# Patient Record
Sex: Male | Born: 1965 | Race: White | Hispanic: No
Health system: Midwestern US, Community
[De-identification: ages and names within clinical notes are randomized; demographics above are authoritative.]

## PROBLEM LIST (undated history)

## (undated) DIAGNOSIS — M549 Dorsalgia, unspecified: Secondary | ICD-10-CM

## (undated) DIAGNOSIS — E1140 Type 2 diabetes mellitus with diabetic neuropathy, unspecified: Principal | ICD-10-CM

## (undated) DIAGNOSIS — B182 Chronic viral hepatitis C: Principal | ICD-10-CM

## (undated) DIAGNOSIS — G629 Polyneuropathy, unspecified: Secondary | ICD-10-CM

## (undated) DIAGNOSIS — Z87820 Personal history of traumatic brain injury: Secondary | ICD-10-CM

## (undated) DIAGNOSIS — F329 Major depressive disorder, single episode, unspecified: Secondary | ICD-10-CM

## (undated) DIAGNOSIS — M24562 Contracture, left knee: Secondary | ICD-10-CM

## (undated) DIAGNOSIS — I10 Essential (primary) hypertension: Secondary | ICD-10-CM

## (undated) DIAGNOSIS — R569 Unspecified convulsions: Secondary | ICD-10-CM

## (undated) DIAGNOSIS — E119 Type 2 diabetes mellitus without complications: Secondary | ICD-10-CM

## (undated) DIAGNOSIS — E079 Disorder of thyroid, unspecified: Secondary | ICD-10-CM

## (undated) DIAGNOSIS — M24552 Contracture, left hip: Secondary | ICD-10-CM

## (undated) DIAGNOSIS — F32A Depression, unspecified: Secondary | ICD-10-CM

## (undated) DIAGNOSIS — R1313 Dysphagia, pharyngeal phase: Secondary | ICD-10-CM

## (undated) DIAGNOSIS — E039 Hypothyroidism, unspecified: Secondary | ICD-10-CM

## (undated) DIAGNOSIS — L8990 Pressure ulcer of unspecified site, unspecified stage: Secondary | ICD-10-CM

## (undated) DIAGNOSIS — M24542 Contracture, left hand: Secondary | ICD-10-CM

## (undated) DIAGNOSIS — F411 Generalized anxiety disorder: Secondary | ICD-10-CM

## (undated) DIAGNOSIS — G8110 Spastic hemiplegia affecting unspecified side: Secondary | ICD-10-CM

## (undated) DIAGNOSIS — K5900 Constipation, unspecified: Secondary | ICD-10-CM

## (undated) DIAGNOSIS — F39 Unspecified mood [affective] disorder: Secondary | ICD-10-CM

## (undated) DIAGNOSIS — F458 Other somatoform disorders: Secondary | ICD-10-CM

## (undated) DIAGNOSIS — F29 Unspecified psychosis not due to a substance or known physiological condition: Secondary | ICD-10-CM

## (undated) DIAGNOSIS — M24522 Contracture, left elbow: Secondary | ICD-10-CM

## (undated) DIAGNOSIS — R1312 Dysphagia, oropharyngeal phase: Secondary | ICD-10-CM

## (undated) DIAGNOSIS — G2401 Drug induced subacute dyskinesia: Secondary | ICD-10-CM

## (undated) HISTORY — PX: VENTRICULOPERITONEAL SHUNT: SHX204

---

## 2007-09-19 ENCOUNTER — Encounter: Payer: Self-pay | Admitting: Emergency Medicine

## 2007-09-20 ENCOUNTER — Inpatient Hospital Stay (HOSPITAL_COMMUNITY): Admission: EM | Admit: 2007-09-20 | Discharge: 2007-09-24 | Payer: Self-pay | Admitting: Internal Medicine

## 2007-09-21 ENCOUNTER — Encounter (INDEPENDENT_AMBULATORY_CARE_PROVIDER_SITE_OTHER): Payer: Self-pay | Admitting: Internal Medicine

## 2008-10-10 ENCOUNTER — Inpatient Hospital Stay (HOSPITAL_COMMUNITY): Admission: EM | Admit: 2008-10-10 | Discharge: 2008-10-17 | Payer: Self-pay | Admitting: Emergency Medicine

## 2009-11-20 ENCOUNTER — Observation Stay (HOSPITAL_COMMUNITY): Admission: EM | Admit: 2009-11-20 | Discharge: 2009-11-28 | Payer: Self-pay | Admitting: Emergency Medicine

## 2010-11-06 LAB — GLUCOSE, CAPILLARY
Glucose-Capillary: 110 mg/dL — ABNORMAL HIGH (ref 70–99)
Glucose-Capillary: 117 mg/dL — ABNORMAL HIGH (ref 70–99)
Glucose-Capillary: 120 mg/dL — ABNORMAL HIGH (ref 70–99)
Glucose-Capillary: 129 mg/dL — ABNORMAL HIGH (ref 70–99)
Glucose-Capillary: 130 mg/dL — ABNORMAL HIGH (ref 70–99)
Glucose-Capillary: 133 mg/dL — ABNORMAL HIGH (ref 70–99)
Glucose-Capillary: 139 mg/dL — ABNORMAL HIGH (ref 70–99)
Glucose-Capillary: 143 mg/dL — ABNORMAL HIGH (ref 70–99)
Glucose-Capillary: 143 mg/dL — ABNORMAL HIGH (ref 70–99)
Glucose-Capillary: 144 mg/dL — ABNORMAL HIGH (ref 70–99)
Glucose-Capillary: 152 mg/dL — ABNORMAL HIGH (ref 70–99)
Glucose-Capillary: 164 mg/dL — ABNORMAL HIGH (ref 70–99)
Glucose-Capillary: 171 mg/dL — ABNORMAL HIGH (ref 70–99)

## 2010-11-06 LAB — COMPREHENSIVE METABOLIC PANEL
Alkaline Phosphatase: 181 U/L — ABNORMAL HIGH (ref 39–117)
Calcium: 9.1 mg/dL (ref 8.4–10.5)
Chloride: 107 mEq/L (ref 96–112)
GFR calc non Af Amer: >60 mL/min (ref >60)
Glucose, Bld: 113 mg/dL — ABNORMAL HIGH (ref 70–99)
Potassium: 3.7 mEq/L (ref 3.5–5.1)
Sodium: 145 mEq/L (ref 135–145)

## 2010-11-06 LAB — URINE CULTURE: Special Requests: NEGATIVE

## 2010-11-06 LAB — URINALYSIS, ROUTINE W REFLEX MICROSCOPIC
Glucose, UA: NEGATIVE mg/dL
Ketones, ur: NEGATIVE mg/dL
Protein, ur: NEGATIVE mg/dL
Specific Gravity, Urine: 1.024 (ref 1.005–1.030)
pH: 6 (ref 5.0–8.0)

## 2010-11-06 LAB — LIPID PANEL
Triglycerides: 213 mg/dL — ABNORMAL HIGH (ref <150)
VLDL: 43 mg/dL — ABNORMAL HIGH (ref 0–40)

## 2010-11-06 LAB — RAPID URINE DRUG SCREEN, HOSP PERFORMED: Cocaine: NOT DETECTED

## 2010-11-06 LAB — MAGNESIUM: Magnesium: 2.1 mg/dL (ref 1.5–2.5)

## 2010-11-06 LAB — PHOSPHORUS: Phosphorus: 3.8 mg/dL (ref 2.3–4.6)

## 2010-11-06 LAB — PROTIME-INR
INR: 1.07 (ref 0.00–1.49)
Prothrombin Time: 13.8 seconds (ref 11.6–15.2)

## 2010-11-06 LAB — APTT: aPTT: 21 seconds — ABNORMAL LOW (ref 24–37)

## 2010-11-06 LAB — CARDIAC PANEL(CRET KIN+CKTOT+MB+TROPI)
CK, MB: 2.9 ng/mL (ref 0.3–4.0)
Total CK: 709 U/L — ABNORMAL HIGH (ref 7–232)
Total CK: 757 U/L — ABNORMAL HIGH (ref 7–232)
Troponin I: 0.04 ng/mL (ref 0.00–0.06)
Troponin I: 0.06 ng/mL (ref 0.00–0.06)

## 2010-11-06 LAB — DIFFERENTIAL
Basophils Absolute: 0 10*3/uL (ref 0.0–0.1)
Eosinophils Relative: 4 % (ref 0–5)
Lymphocytes Relative: 51 % — ABNORMAL HIGH (ref 12–46)
Neutro Abs: 3.8 10*3/uL (ref 1.7–7.7)
Neutrophils Relative %: 35 % — ABNORMAL LOW (ref 43–77)

## 2010-11-06 LAB — CBC
HCT: 49.8 % (ref 39.0–52.0)
Hemoglobin: 16 g/dL (ref 13.0–17.0)
MCHC: 32.1 g/dL (ref 30.0–36.0)
Platelets: 221 10*3/uL (ref 150–400)
RDW: 17.9 % — ABNORMAL HIGH (ref 11.5–15.5)

## 2010-11-06 LAB — HEMOGLOBIN A1C: Hgb A1c MFr Bld: 7.5 % — ABNORMAL HIGH (ref 4.6–6.1)

## 2010-11-28 LAB — CBC
HCT: 46.7 % (ref 39.0–52.0)
Hemoglobin: 15.5 g/dL (ref 13.0–17.0)
MCHC: 33.1 g/dL (ref 30.0–36.0)
MCV: 84.6 fL (ref 78.0–100.0)
Platelets: 335 10*3/uL (ref 150–400)
RBC: 5.51 MIL/uL (ref 4.22–5.81)
RDW: 16.2 % — ABNORMAL HIGH (ref 11.5–15.5)
WBC: 8.7 10*3/uL (ref 4.0–10.5)

## 2010-11-28 LAB — GLUCOSE, CAPILLARY
Glucose-Capillary: 144 mg/dL — ABNORMAL HIGH (ref 70–99)
Glucose-Capillary: 150 mg/dL — ABNORMAL HIGH (ref 70–99)
Glucose-Capillary: 157 mg/dL — ABNORMAL HIGH (ref 70–99)
Glucose-Capillary: 161 mg/dL — ABNORMAL HIGH (ref 70–99)
Glucose-Capillary: 182 mg/dL — ABNORMAL HIGH (ref 70–99)
Glucose-Capillary: 199 mg/dL — ABNORMAL HIGH (ref 70–99)

## 2010-12-03 LAB — POCT I-STAT, CHEM 8
Chloride: 101 mEq/L (ref 96–112)
HCT: 41 % (ref 39.0–52.0)
Potassium: 3.1 mEq/L — ABNORMAL LOW (ref 3.5–5.1)

## 2010-12-03 LAB — CBC
HCT: 36.8 % — ABNORMAL LOW (ref 39.0–52.0)
HCT: 38.1 % — ABNORMAL LOW (ref 39.0–52.0)
HCT: 41.1 % (ref 39.0–52.0)
MCHC: 33.7 g/dL (ref 30.0–36.0)
MCHC: 34.3 g/dL (ref 30.0–36.0)
MCV: 83.8 fL (ref 78.0–100.0)
MCV: 84 fL (ref 78.0–100.0)
Platelets: 302 10*3/uL (ref 150–400)
Platelets: 328 10*3/uL (ref 150–400)
Platelets: 335 10*3/uL (ref 150–400)
Platelets: 354 10*3/uL (ref 150–400)
Platelets: 362 10*3/uL (ref 150–400)
RDW: 15.9 % — ABNORMAL HIGH (ref 11.5–15.5)
WBC: 6.4 10*3/uL (ref 4.0–10.5)
WBC: 6.8 10*3/uL (ref 4.0–10.5)
WBC: 6.9 10*3/uL (ref 4.0–10.5)
WBC: 8.4 10*3/uL (ref 4.0–10.5)

## 2010-12-03 LAB — PROTIME-INR
INR: 1.1 (ref 0.00–1.49)
INR: 1.3 (ref 0.00–1.49)
Prothrombin Time: 13.6 seconds (ref 11.6–15.2)
Prothrombin Time: 14.3 seconds (ref 11.6–15.2)
Prothrombin Time: 16.4 seconds — ABNORMAL HIGH (ref 11.6–15.2)

## 2010-12-03 LAB — LUPUS ANTICOAGULANT PANEL: PTT Lupus Anticoagulant: 44.1 secs (ref 36.3–48.8)

## 2010-12-03 LAB — BASIC METABOLIC PANEL
BUN: 3 mg/dL — ABNORMAL LOW (ref 6–23)
BUN: 3 mg/dL — ABNORMAL LOW (ref 6–23)
CO2: 28 mEq/L (ref 19–32)
Calcium: 9 mg/dL (ref 8.4–10.5)
Chloride: 102 mEq/L (ref 96–112)
Creatinine, Ser: 0.54 mg/dL (ref 0.4–1.5)
Creatinine, Ser: 0.63 mg/dL (ref 0.4–1.5)
GFR calc Af Amer: >60 mL/min (ref >60)
GFR calc non Af Amer: >60 mL/min (ref >60)
Glucose, Bld: 257 mg/dL — ABNORMAL HIGH (ref 70–99)
Potassium: 3.6 mEq/L (ref 3.5–5.1)
Sodium: 139 mEq/L (ref 135–145)

## 2010-12-03 LAB — DIFFERENTIAL
Basophils Absolute: 0.1 10*3/uL (ref 0.0–0.1)
Basophils Relative: 1 % (ref 0–1)
Eosinophils Relative: 4 % (ref 0–5)
Lymphocytes Relative: 35 % (ref 12–46)

## 2010-12-03 LAB — URINALYSIS, ROUTINE W REFLEX MICROSCOPIC
Hgb urine dipstick: NEGATIVE
Nitrite: NEGATIVE
Specific Gravity, Urine: 1.011 (ref 1.005–1.030)
Urobilinogen, UA: 1 mg/dL (ref 0.0–1.0)
pH: 7.5 (ref 5.0–8.0)

## 2010-12-03 LAB — URINE CULTURE: Colony Count: 90000

## 2010-12-03 LAB — TSH: TSH: 0.486 u[IU]/mL (ref 0.350–4.500)

## 2010-12-03 LAB — GLUCOSE, CAPILLARY
Glucose-Capillary: 142 mg/dL — ABNORMAL HIGH (ref 70–99)
Glucose-Capillary: 171 mg/dL — ABNORMAL HIGH (ref 70–99)
Glucose-Capillary: 203 mg/dL — ABNORMAL HIGH (ref 70–99)
Glucose-Capillary: 235 mg/dL — ABNORMAL HIGH (ref 70–99)
Glucose-Capillary: 250 mg/dL — ABNORMAL HIGH (ref 70–99)
Glucose-Capillary: 252 mg/dL — ABNORMAL HIGH (ref 70–99)

## 2010-12-03 LAB — MAGNESIUM: Magnesium: 2.1 mg/dL (ref 1.5–2.5)

## 2010-12-03 LAB — BETA-2-GLYCOPROTEIN I ABS, IGG/M/A
Beta-2-Glycoprotein I IgA: 13 U/mL (ref <10)
Beta-2-Glycoprotein I IgM: <4 U/mL (ref <10)

## 2010-12-03 LAB — APTT: aPTT: 36 seconds (ref 24–37)

## 2010-12-03 LAB — PROTEIN S, TOTAL: Protein S Ag, Total: 137 % (ref 70–140)

## 2010-12-03 LAB — HOMOCYSTEINE: Homocysteine: 4.5 umol/L (ref 4.0–15.4)

## 2010-12-03 LAB — PROTEIN S ACTIVITY: Protein S Activity: 120 % (ref 69–129)

## 2010-12-03 LAB — FACTOR 5 LEIDEN

## 2010-12-03 LAB — ANTITHROMBIN III: AntiThromb III Func: 83 % (ref 76–126)

## 2010-12-03 LAB — CARDIOLIPIN ANTIBODIES, IGG, IGM, IGA: Anticardiolipin IgA: 30 [APL'U] (ref <13)
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
## 2010-12-31 NOTE — Procedures (Signed)
EEG NUMBER:  [id number]   HISTORY OF PRESENT ILLNESS:  This is a 45 year old with altered mental  status who is in psychosis who is having EEG done to evaluate for  seizures.   PROCEDURE:  This is a portable EEG.   TECHNICAL DESCRIPTION:  Per this portable EEG, there is no distinct or  sustained posterior dominant rhythm.  Background activity symmetric  mostly comprised of low-voltage mixed frequency activity primarily alpha  and theta range activity at 10-15 microvolts.  The patient does have a  tremendous amount of blinking in the EMG artifact.  Photic stimulation  or hyperventilation performed throughout this record.  The patient did  not go to sleep during this tracing.  Throughout this record, there is  no definitive epileptiform activity noted.  EKG tracing shows a heart  rate of 80-100 beats per minute.   IMPRESSION:  This portable EEG is essentially within normal limits in  the awake state.  There could possibly be some mild background slowing  noticed which could be suggestive of a toxic metabolic or primary  neuronal disorder.  There is no definitive epileptiform activity noticed  throughout this recording.      Bevelyn Buckles. Nash Shearer, M.D.  Electronically Signed     BJY:NWGN  D:  09/23/2007 19:50:46  T:  09/24/2007 19:24:26  Job #:  [id number]
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
## 2011-05-09 LAB — BASIC METABOLIC PANEL
BUN: 5 — ABNORMAL LOW
BUN: 6
Calcium: 8.1 — ABNORMAL LOW
Calcium: 8.4
Calcium: 8.8
Chloride: 110
Creatinine, Ser: 0.87
Creatinine, Ser: 0.91
Creatinine, Ser: 0.99
GFR calc Af Amer: >60
GFR calc Af Amer: >60
GFR calc non Af Amer: >60
GFR calc non Af Amer: >60
Glucose, Bld: 103 — ABNORMAL HIGH
Glucose, Bld: 84
Potassium: 3.6
Sodium: 140

## 2011-05-09 LAB — COMPREHENSIVE METABOLIC PANEL
ALT: 16
AST: 15
AST: 32
Albumin: 2.9 — ABNORMAL LOW
Albumin: 3.3 — ABNORMAL LOW
Alkaline Phosphatase: 70
Alkaline Phosphatase: 83
CO2: 27
Chloride: 109
Chloride: 109
Creatinine, Ser: 0.79
GFR calc Af Amer: >60
GFR calc Af Amer: >60
GFR calc non Af Amer: >60
Potassium: 3 — ABNORMAL LOW
Potassium: 3.1 — ABNORMAL LOW
Total Bilirubin: 1.1
Total Bilirubin: 1.4 — ABNORMAL HIGH

## 2011-05-09 LAB — URINALYSIS, ROUTINE W REFLEX MICROSCOPIC
Bilirubin Urine: NEGATIVE
Glucose, UA: NEGATIVE
Hgb urine dipstick: NEGATIVE
Ketones, ur: NEGATIVE
Nitrite: NEGATIVE
pH: 5.5

## 2011-05-09 LAB — CBC
HCT: 44.5
Hemoglobin: 14.5
MCHC: 32.6
MCV: 79.9
MCV: 80.5
MCV: 80.6
Platelets: 244
Platelets: 262
Platelets: 296
RBC: 4.9
RBC: 5.17
RDW: 17.4 — ABNORMAL HIGH
WBC: 12.3 — ABNORMAL HIGH
WBC: 7.2
WBC: 8.7

## 2011-05-09 LAB — DIFFERENTIAL
Basophils Absolute: 0.1
Basophils Relative: 0
Eosinophils Absolute: 0
Eosinophils Relative: 0
Monocytes Absolute: 0.4

## 2011-05-09 LAB — CK TOTAL AND CKMB (NOT AT ARMC): Relative Index: 2.1

## 2011-05-09 LAB — CULTURE, BLOOD (ROUTINE X 2)

## 2011-05-09 LAB — I-STAT 8, (EC8 V) (CONVERTED LAB)
BUN: 14
Chloride: 110
Glucose, Bld: 146 — ABNORMAL HIGH
HCT: 49
Operator id: 151321
pCO2, Ven: 42.5 — ABNORMAL LOW
pH, Ven: 7.43 — ABNORMAL HIGH

## 2011-05-09 LAB — POCT CARDIAC MARKERS
CKMB, poc: 7.5
Myoglobin, poc: >500
Myoglobin, poc: >500
Operator id: 151321
Operator id: 151321
Troponin i, poc: <0.05

## 2011-05-09 LAB — TROPONIN I: Troponin I: 0.01

## 2011-05-09 LAB — PROTIME-INR
INR: 1.2
Prothrombin Time: 15

## 2011-05-09 LAB — VITAMIN B12: Vitamin B-12: 550 (ref 211–911)

## 2011-05-09 LAB — MAGNESIUM: Magnesium: 2.3

## 2014-04-25 MED ORDER — PREGABALIN 50 MG PO CAPS
50 MG | ORAL_CAPSULE | Freq: Three times a day (TID) | ORAL | Status: DC
Start: 2014-04-25 — End: 2014-05-16

## 2014-04-25 MED ORDER — CYANOCOBALAMIN 1000 MCG PO TABS
1000 MCG | ORAL_TABLET | Freq: Every day | ORAL | Status: DC
Start: 2014-04-25 — End: 2014-04-25

## 2014-04-25 MED ORDER — NICOTINE 21 MG/24HR TD PT24
21 MG/24HR | MEDICATED_PATCH | TRANSDERMAL | Status: DC
Start: 2014-04-25 — End: 2014-11-03

## 2014-04-25 MED ORDER — NICOTINE POLACRILEX 4 MG MT LOZG
4 | OROMUCOSAL | Status: DC | PRN
Start: 2014-04-25 — End: 2014-05-02

## 2014-04-25 NOTE — Telephone Encounter (Signed)
Please call patients [facility] and void the prescription for B12 that was sent today in error.  Order was meant to be for B 12 level blood draw.  Patient is supposed to get lab order completed 9/9.  Please provide lab with the additional lab order.  Thanks for your help.  Please let me know if any questions or concerns.

## 2014-04-25 NOTE — Progress Notes (Signed)
Subjective:  Mason Garza is a 48 y.o. male who presents with chief complaint of Establish Care      HPI:  Patient would like to establish with PCP.  Reports receiving limited medical care in past.  States seen primarily at [facility] ED and for a PNA hospitalization.  He was told that he has Hepatitis C, DM2, and neuropathy.  Patient reports seeing an eye doctor and no retinopathy noted.    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  He reports trying gabapentin from ED in past and did not like the way it made him feel.  He would like to get caught up on his health care, address his neuropathy with goal of getting off heroin.  He has a referral to a methadone clinic he has not yet established with yet.  He would like screening for other high risk conditions as he admits to sharing needles.  Former plato A1c (04/2011) was 8.8  He also expressed concern that he may have the tip of a needle in his left wrist that broke off within the last few days while injecting.  He moved parts of it.      Current Meds: reviewed    ROS:  Review of Systems   Constitutional: Positive for weight loss and diaphoresis.        6 months; 220 lbs over the past few years   HENT: Negative.    Eyes: Positive for blurred vision. Negative for double vision, photophobia, pain, discharge and redness.        Saw eye dr within last year - given eye prescription   Respiratory: Negative.    Cardiovascular: Negative.    Gastrointestinal: Negative.    Genitourinary: Negative.    Musculoskeletal:        Neuropathy in BL feet   Skin: Negative.    Neurological: Positive for dizziness. Negative for focal weakness, seizures and loss of consciousness.        Thinks related to BGT   Endo/Heme/Allergies: Does not bruise/bleed easily.   Psychiatric/Behavioral: Positive for substance abuse. Negative for suicidal ideas.       Objective:  Filed Vitals:     04/25/14 1144   BP: 102/66   Pulse: 98   Height:  (1.778 m)   Weight: 179 lb 14.4 oz (81.602 kg)   SpO2: 98%     Physical Exam  General exam: Well developed,appears anxious, cooperative, NAD  HEENT: Normocephalic, atraumatic, PERRL, EOMI, poor dentition, MMM, no thyromegaly, no LAD, neck non-tender  Cardiac: Normal S1 S2, RRR, no murmurs/rubs/gallops, chest wall non-tender  Respiratory: CTABL, no wheezes/crackles/rhonchi, non-labored  Abdominal: Normal bowel sounds, non-tender, non-distended, no masses palpable, no guarding/rebound/rigidity  Extremities: Scars to bilateral upper extremities; left forearm with scab (no erythema or drainage); no peripheral edema, 2+ pulses bilaterally upper and lower extremities  Neuro: A+O x 3, cranial nerves grossly intact, no focal deficits/weakness  Foot Exam: complete exam deferred to diabetic fu appointment; noted nail changes    Assessment & Plan:   1. IVDU (intravenous drug user)  CBC Auto Differential    HIV Panel, 1/2 Antibody, P24 Antigen    Hepatitis B Surface Antibody    Hepatitis B Surface Antigen   2. Hepatitis C virus infection without hepatic coma, unspecified chronicity  Comprehensive Metabolic Panel    CBC Auto Differential    Hepatitis B Surface Antibody  Hepatitis B Surface Antigen   3. Type 2 diabetes mellitus with diabetic neuropathy (HCC)  pregabalin (LYRICA) 50 MG capsule    Comprehensive Metabolic Panel    Ferritin    Hemoglobin A1C    Microalbumin / Creatinine Urine Ratio    Vitamin B12    DISCONTINUED: cyanocobalamin ([employer] VITAMIN B12) 1000 MCG tablet   4. Tobacco use  nicotine (NICODERM CQ) 21 MG/24HR    nicotine polacrilex (NICORETTE MINI) 4 MG lozenge   5. Screening for hyperlipidemia  Lipid Panel   6. Foreign body (FB) in soft tissue  XR Wrist Left Standard     Discussed potential harms of IVDU including organ damage, infection, overdose, addiction.  Patient expressed desire to quit using tobacco and heroin.  Will check for foreign body in left  arm.  Will screen for high risk infections and obtain baseline lab work for known diagnosis before starting further treatment.  Due to severity of neuropathy symptoms will trial lyrica in hopes that patient will be able to stop IVDU.  Encouraged patient to followup with methadone clinic.  Will schedule close followup to review labs, further recommendations, and titration/start of treatment for the above.  DM foot exam/glucometer supplies/DM shoes to be discussed at followup.    Medication indications, directions, and side effects were discussed.     Return in about 1 week (around 05/02/2014) for review labs.    Electronically signed by Ellene Route, MD on 04/25/14 at 12:01 PM

## 2014-04-26 NOTE — Telephone Encounter (Signed)
Per pharm pt already picked up--will call pt

## 2014-04-27 LAB — CBC WITH AUTO DIFFERENTIAL
Basophils %: 0.9
Basophils Absolute: 0.1 (ref 0.0–0.1)
Eosinophils %: 1.1
Eosinophils Absolute: 0.1 (ref 0.0–0.5)
Hematocrit: 40.1 (ref 40–52)
Hemoglobin: 13.5 (ref 13.0–18.0)
Lymphocytes %: 24.5
Lymphocytes Absolute: 2.5 (ref 1.0–4.3)
MCH: 29.6 (ref 26–34)
MCHC: 33.6 (ref 32–36)
MCV: 88 (ref 80–98)
MPV: 7.9 (ref 7.4–10.4)
Monocytes %: 7.2
Monocytes Absolute: 0.7 (ref 0.0–0.8)
Neutrophils Absolute: 6.6 (ref 1.8–7.0)
Platelets: 217 (ref 140–440)
RBC: 4.56 (ref 4.4–5.9)
RDW: 13 (ref 11.5–14.5)
Segs Relative: 66.3
WBC: 10 (ref 3.6–10.7)

## 2014-04-27 LAB — COMPREHENSIVE METABOLIC PANEL
ALT: 49 (ref 13–61)
AST: 25 (ref 0–31)
Albumin,Serum: 3.5 (ref 3.4–5.0)
Albumin/Globulin Ratio: 1 (ref 1.0–2.5)
Alkaline Phosphatase: 122 — ABNORMAL HIGH (ref 45–117)
Anion Gap: 9 (ref 6–16)
BUN/Creatinine Ratio: 12.9 (ref 6.0–20.0)
BUN: 11 (ref 7–25)
CO2: 26 (ref 21–31)
Calcium: 8.7 (ref 8.2–10.5)
Chloride: 107 (ref 98–109)
Creatinine: 0.85 (ref 0.60–1.50)
GFR African American: >60
GFR Non-African American: >60
Globulin: 3.4 (ref 2.4–4.1)
Glucose: 93 (ref 70–100)
Potassium: 3.9 (ref 3.5–5.0)
Sodium: 142 (ref 135–145)
Total Bilirubin: 0.4 (ref 0.3–1.0)
Total Protein: 6.9 (ref 6.4–8.3)

## 2014-04-27 LAB — LIPID PANEL
Cholesterol, Total: 92 — ABNORMAL LOW (ref 100–199)
HDL: 24 — ABNORMAL LOW (ref 40–59)
LDL Calculated: 45 (ref 0–129)
Triglycerides: 114 (ref 40–149)

## 2014-04-27 LAB — FERRITIN: Ferritin: 170 (ref 26–388)

## 2014-04-27 LAB — VITAMIN B12: Vitamin B-12: 1080 — ABNORMAL HIGH (ref 193–989)

## 2014-04-27 NOTE — Addendum Note (Signed)
Addended by: Lynda Rainwater A on: 04/27/2014 11:07 AM     Modules accepted: Orders

## 2014-04-28 LAB — MICROALBUMIN / CREATININE URINE RATIO
Creatinine, Ur: 33
Microalbumin Creatinine Ratio: <30 (ref 0–30)
Microalbumin, Random Urine: <0.5

## 2014-04-28 LAB — HIV-1/HIV-2 ANTIBODY DIFFERENTIATION (SUPPLEMENTAL): HIV-1/HIV-2 Ab: NONREACTIVE

## 2014-04-28 LAB — HEPATITIS B SURFACE ANTIBODY: Hep B S Ab: NONREACTIVE

## 2014-04-28 LAB — HEPATITIS B SURFACE ANTIGEN: Hepatitis B Surface Ag: NONREACTIVE

## 2014-04-28 LAB — HEMOGLOBIN A1C: Hemoglobin A1C: 6.5 — ABNORMAL HIGH (ref 4.2–5.6)

## 2014-05-02 ENCOUNTER — Telehealth

## 2014-05-02 LAB — POCT URINALYSIS DIPSTICK W/O MICROSCOPE (AUTO)
Bilirubin, UA: NEGATIVE
Blood, UA POC: NEGATIVE
Glucose, UA POC: NEGATIVE
Ketones, UA: NEGATIVE
Leukocytes, UA: NEGATIVE
Nitrite, UA: NEGATIVE
Protein, UA POC: NEGATIVE
Spec Grav, UA: 1.03
Urobilinogen, UA: NEGATIVE
pH, UA: 6

## 2014-05-02 LAB — URINALYSIS-MACROSCOPIC
Bilirubin Urine: NEGATIVE
Casts: 3 /LPF
Erythrocytes, Urine: 0 /HPF (ref 0–3)
Ketones, Urine: NEGATIVE
Leukocyte Esterase, Urine: NEGATIVE
Nitrite, Urine: NEGATIVE
Protein, UA: NEGATIVE
Specific Gravity, UA: 1.02 (ref 1.005–1.030)
Urine Hgb: NEGATIVE
Urobilinogen, Urine: 1 mG/dL (ref 0.2–1.0)
WBC: 2 /HPF (ref 0–5)
pH, UA: 6 (ref 5.0–8.0)

## 2014-05-02 MED ORDER — TETANUS-DIPHTH-ACELL PERTUSSIS 5-2-15.5 LF-MCG/0.5 IM SUSP
Freq: Once | INTRAMUSCULAR | Status: AC
Start: 2014-05-02 — End: 2014-05-02

## 2014-05-02 MED ORDER — NICOTINE POLACRILEX 4 MG MT GUM
4 | OROMUCOSAL | Status: DC | PRN
Start: 2014-05-02 — End: 2014-11-03

## 2014-05-02 MED ORDER — BD LANCET DEVICE MISC
Status: DC
Start: 2014-05-02 — End: 2017-06-07

## 2014-05-02 MED ORDER — METFORMIN HCL 500 MG PO TABS
500 MG | ORAL_TABLET | Freq: Two times a day (BID) | ORAL | Status: DC
Start: 2014-05-02 — End: 2014-09-11

## 2014-05-02 MED ORDER — FREESTYLE SYSTEM KIT
PACK | Freq: Every day | Status: DC | PRN
Start: 2014-05-02 — End: 2015-03-19

## 2014-05-02 MED ORDER — GLUCOSE BLOOD VI STRP
Freq: Every day | Status: DC
Start: 2014-05-02 — End: 2015-06-26

## 2014-05-02 MED ORDER — LANCETS MISC
Status: DC
Start: 2014-05-02 — End: 2017-06-07

## 2014-05-02 NOTE — Telephone Encounter (Signed)
Pt states pharm says they did not receive rx for glucose monitor, needs rx sent to pharm please

## 2014-05-02 NOTE — Progress Notes (Signed)
Subjective:  Mason Garza is a 48 y.o. male who presents with chief complaint of Check-Up and to review lab results.      HPI:  Patient presents to review lab results.  DM2:   Lab Results   Component Value Date    LABA1C 6.5* 04/27/2014   Patient reports today that he has been intermittingly taken metformin from his mother? over the last few months.  He reports still having occassional episodes of feeling lightheaded and dizzy which he thinks is related to blood sugar issues and poor PO intake.  He reports that he typically only has one full meal per day or less.  He does not have diabetic supplies at home.  He was not able to get the prescription for his neuropathy filled due to insurance issues. PA completed while in the office today.  Reports trial of gabapentin from ED (confirmed) with no relief.  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  Reports difficulty sleeping due to this also with some muscle spasms at night.  He is on a waiting list for the methadone clinic but is hesitant about starting methadone as he is afraid of becoming addicted.  He reports that he could stop taking heroin if his neuropathy was under control.  He states he has never had a diabetic eye exam.    Tobacco use/substance abuse: patient wearing the nicotine patch with decreased cig/day. He tried using the lozenges but states they make him hiccup so he still was smoking occasionally (1/2 ppd) when he got smoking urges.  He requests trial of the gum instead.  He reports that he is thinking of moving in with his mother in order to remove himself from negative influences.     Now has a Multimedia programmer (joseph) who will be arranging for a case Freight forwarder.  Likely will do house visit to access needs.  Concerned that he needs a shower chair due to deconditioning/generalized weakness/dizziness episodes.  Requesting letter stating that he is treated for neuropathy to provide to the food  bank.  Reports some difficulty with urination'; incomplete emptying; can have dribbling and discomfort with voiding.  Reports that he was in jail 6-8 months ago.  Reports occasional sexual encounters with a high risk partner  Patient denies history of cholesterol issues or being on statin.  Lab Results   Component Value Date    CHOL 92* 04/27/2014     Lab Results   Component Value Date    TRIG 114 04/27/2014     Lab Results   Component Value Date    HDL 24* 04/27/2014     Lab Results   Component Value Date    LDLCALC 45 04/27/2014       Current Meds: reviewed    Past Medical History   Diagnosis Date   ??? Type II or unspecified type diabetes mellitus without mention of complication, not stated as uncontrolled ([place])    ??? Neuropathy    ??? Hepatitis C      History reviewed. No pertinent past surgical history.  History reviewed. No pertinent family history.  History     Social History   ??? Marital Status: Divorced     Spouse Name: N/A     Number of Children: N/A   ??? Years of Education: N/A     Social History Main Topics   ??? Smoking status: Current Every Day Smoker -- 1.00 packs/day for 30 years   ???  Smokeless tobacco: Current User     Types: Chew      Comment: Interested in NRT + lozenge   ??? Alcohol Use: 0.0 oz/week     0 Not specified per week      Comment: 6 pack per yr now; former 6-12 pk per day for 10 yrs   ??? Drug Use: 10.00 per week      Comment: last heroin use yesterday - IVDA; using 1-2x per day   ??? Sexual Activity: None     Other Topics Concern   ??? None     Social History Narrative       ROS:  Review of Systems   Constitutional: Negative for fever and chills.   HENT: Negative.    Eyes: Positive for blurred vision.        Last seen by eye doctor 6-8 months ago - diabetic eye exam at [facility]   Respiratory: Positive for sputum production. Negative for hemoptysis and wheezing.         Chronic    Cardiovascular: Negative.    Gastrointestinal: Negative.    Genitourinary: Positive for dysuria.        Dribbling; has  been going on for a few months   Musculoskeletal: Positive for falls.        Fell due dizziness: lightheaded episode; neuropathy   Skin: Negative for itching and rash.   Neurological: Positive for dizziness and weakness. Negative for seizures and loss of consciousness.   Endo/Heme/Allergies: Does not bruise/bleed easily.       Objective:  Filed Vitals:    05/02/14 1303   BP: 124/77   Pulse: 73   Height: 5' 10"  (1.778 m)   Weight: 178 lb 12.8 oz (81.103 kg)   SpO2: 97%     Physical Exam  General exam: Well developed, appears older than stated age, cooperative  HEENT: Normocephalic, atraumatic, PERRL, EOMI, MMM, no thyromegaly, no LAD, neck non-tender  Cardiac: Normal S1 S2, RRR, no murmurs/rubs/gallops, chest wall non-tender  Respiratory: CTABL, no wheezes/crackles/rhonchi, non-labored  Abdominal: Normal bowel sounds, non-tender, non-distended, no masses palpable, no guarding/rebound/rigidity  Extremities: No edema, 2+ pulses bilaterally upper and lower extremities  Neuro: A+O x 3, cranial nerves grossly intact, no focal deficits/weakness  Diabetic Foot Exam: Abnormal monofilament exam left & right foot; multiple thick cracked calluses, thick yellowed toenails, 2+ pulses, no open lesions    Assessment & Plan:   1. Type 2 diabetes mellitus with diabetic neuropathy (HCC)  glucose blood VI test strips (ACURA BLOOD GLUCOSE TEST) strip    Lancets MISC    Lancet Devices (B-D LANCET DEVICE) MISC    metFORMIN (GLUCOPHAGE) 500 MG tablet    External Referral to Podiatry    glucose monitoring kit (FREESTYLE) monitoring kit    Will start oral treatment as patient admits to taking metformin which is likely reason for improvement in glycemic control. Will order diabetic testing supplies.  Patient is past due for retinal exam - will refer. Foot care instructions given today.  Patient instructed to request his insurance send [place] the diabetic shoe forms and we will also refer him to podiatry.   2. Hepatitis C virus infection without  hepatic coma, unspecified chronicity  Hepatitis A-Hepatitis B combined vaccine IM Garnet Koyanagi)  Patient given order for vaccination to take to health department (insurance will not cover cost in the office). He will need complete vaccination series.  Once no longer IVDU will order viral loads to confirm chronic infection and  additional tests to see if he would be a candidate for hepatitis treatment.     3. Hyperlipidemia  Discussed the need for improved nutrition and 3 meals daily.  Patient reports that he will move in with his mother and she will assist with meal provisions.  Due to severity of neuropathy symptoms will hold off on starting statin until neuropathy better controlled to avoid any confusion regarding patients symptoms.   4. Need for diphtheria-tetanus-pertussis (Tdap) vaccine  Tdap (ADACEL) 12-17-13.5 LF-MCG/0.5 injection   5. Need for hepatitis A and B vaccination  Hepatitis A-Hepatitis B combined vaccine IM (TWINRIX)   6. Tobacco use  nicotine polacrilex (NICORETTE) 4 MG gum   7. Dysuria  POCT Urinalysis No Micro (Auto)    Chlamydia Trachomatis & Neisseria gonorrhoeae (GC) by amplified detection    URINALYSIS, MICRO    URINALYSIS, MICRO       Medication indications, directions, and side effects were discussed.     Return in about 1 month (around 06/01/2014).    Electronically signed by Omelia Blackwater, MD on 05/02/14 at 1:08 PM

## 2014-05-02 NOTE — Telephone Encounter (Signed)
I ordered it under the wrong type.  Order re-entered and sent to pharmacy.  Please call tomorrow and make sure no other issues.  Thanks for your help.

## 2014-05-02 NOTE — Telephone Encounter (Signed)
Lyrica

## 2014-05-03 LAB — C.TRACHOMATIS N.GONORRHOEAE DNA

## 2014-05-03 MED ORDER — AMITRIPTYLINE HCL 50 MG PO TABS
50 MG | ORAL_TABLET | Freq: Every evening | ORAL | Status: DC
Start: 2014-05-03 — End: 2014-05-18

## 2014-05-03 MED ORDER — CAPSAICIN 0.025 % EX GEL
0.025 % | Freq: Four times a day (QID) | CUTANEOUS | Status: DC | PRN
Start: 2014-05-03 — End: 2014-06-01

## 2014-05-03 NOTE — Telephone Encounter (Signed)
Pt called upset because his meter is still not at pharm and he is in a lot of pain. Also the Lyrica is not at pharm either. Pt going to need something for pain because pt stated it will take a while for Lyrica to to get into his system. Can you please give pt a call as soon as this is resolved [phone]. Please advise.

## 2014-05-03 NOTE — Telephone Encounter (Signed)
I called and spoke with patient personally.  He reports that he spoke with the PA department today and lyrica was denies but they would cover gabapentin, elavil, effexor, tegreol, and ultram without PA according to the patient.  I sent in prescription for Elavil and ointment.  Patient aware, all questions answered.    I also called patient's pharmacy and confirmed receipt of prescription.  Pharmacist confirmed that they are covered by insurance and patient can pick up elavil tonight but they will have to order the ointment.

## 2014-05-03 NOTE — Telephone Encounter (Signed)
Spoke to pharm and they did receive the rx for diabetic testing supplies and are going to fill it.  The lyrica needs prior auth from insurance form to be completed today.  Lmtc

## 2014-05-04 NOTE — Telephone Encounter (Signed)
Dr.tyree, please advise

## 2014-05-04 NOTE — Telephone Encounter (Signed)
Pharmacy called in and left a voice mail stating that they received a script for capsasin 0.025% gel. They do not have this in stock. They do have a cream if it is ok to switch to that. It comes in a 60g pack.

## 2014-05-04 NOTE — Telephone Encounter (Signed)
Pharm aware

## 2014-05-04 NOTE — Telephone Encounter (Signed)
Yes they can switch prescription to dispense 60 grams.  Thanks.

## 2014-05-18 ENCOUNTER — Telehealth

## 2014-05-18 MED ORDER — AMITRIPTYLINE HCL 50 MG PO TABS
50 MG | ORAL_TABLET | Freq: Every evening | ORAL | Status: DC
Start: 2014-05-18 — End: 2014-05-19

## 2014-05-18 MED ORDER — AMITRIPTYLINE HCL 50 MG PO TABS
50 MG | ORAL_TABLET | Freq: Every evening | ORAL | Status: DC
Start: 2014-05-18 — End: 2014-05-18

## 2014-05-18 NOTE — Telephone Encounter (Signed)
Please let patient know that he can increase his dose to 100mg  at bedtime  (he can take 50mg  2 tablets q hs).  Thanks.

## 2014-05-18 NOTE — Telephone Encounter (Signed)
Pt aware, states he does not wish to increase medication because it makes him so tired, pt would like to know if there is an alternative rx, please advise

## 2014-05-18 NOTE — Telephone Encounter (Signed)
Dr.tyree please advise if pt may increase amitriptyline, or if there is another medication you would recommend he take during the day? Please advise

## 2014-05-18 NOTE — Telephone Encounter (Signed)
Please let patient know that we can switch him to another medication called Effexor.    The Elavil and Effexor can have a drug interaction when taken at the same time.  So if he wants to switch to another medication we will need to taper off the elavil first.      If he wants to switch please tell him to decrease his elavil to half a tablet (25mg  per day) for 1 week then stop.  Then we can start the Effexor ER 37.5mg  daily.      Please let me know what patient would like to do.    Thanks.

## 2014-05-18 NOTE — Telephone Encounter (Signed)
Pt is calling in he is taking amitriptyline. Takes on pill at night for his diabetic neuropathy. States that he sleeps well and has no pain during the night because he is sleeping so sound. But by the next morning the medication wears off and he is in a lot of pain. Wants to know if the medication needs changed or if he can just change the dosing. Please call patient to let him know what to do

## 2014-05-19 MED ORDER — VENLAFAXINE HCL ER 37.5 MG PO CP24
37.5 MG | ORAL_CAPSULE | Freq: Every day | ORAL | Status: DC
Start: 2014-05-19 — End: 2014-06-13

## 2014-05-19 MED ORDER — AMITRIPTYLINE HCL 50 MG PO TABS
50 MG | ORAL_TABLET | Freq: Every evening | ORAL | Status: DC
Start: 2014-05-19 — End: 2014-06-01

## 2014-05-19 NOTE — Telephone Encounter (Signed)
Pt aware, states he is willing to try effexor, asks for rx to be sent to [facility] please

## 2014-05-19 NOTE — Telephone Encounter (Signed)
Home medication list adjusted.  New prescription sent to pharmacy.

## 2014-06-01 MED ORDER — LOPERAMIDE HCL 2 MG PO CAPS
2 MG | ORAL_CAPSULE | ORAL | Status: DC
Start: 2014-06-01 — End: 2014-11-03

## 2014-06-01 MED ORDER — TRAMADOL HCL 50 MG PO TABS
50 MG | ORAL_TABLET | ORAL | Status: DC
Start: 2014-06-01 — End: 2014-10-27

## 2014-06-01 MED ORDER — HYDROXYZINE PAMOATE 25 MG PO CAPS
25 MG | ORAL_CAPSULE | ORAL | Status: AC
Start: 2014-06-01 — End: 2014-06-10

## 2014-06-01 NOTE — Progress Notes (Signed)
Subjective:  Mason Garza is a 48 y.o. male who presents with chief complaint of 1 Month Follow-Up      HPI:  Patient presents for follow up of his diabetic neuropathy and heroin IVDU.  He reports doing better on the current regiment for his neuropathy. He has been on the current regimen for ~ 3-4 days.  He is fully off of Amitriptyline.  He reports his neuropathy is less intense and he was able to stop using heroin.  Reports last used heroin 1.5 days ago at a fraction of the usual amount.  He states prior to that he was using heroin daily at 0.5 grams.  He reports since he got back and discontinued he has been having significant withdrawal symptoms.  He wants to completely get off the heroin without transitioning to chronic methadone.  He requests assistance in his withdrawal symptoms control.  He states he has been "in bed" due to symptoms for ~ the last 3 days.  He complains of frequent diarrhea, sweats, shakes, abdominal cramping, nausea, and difficulty with sleep and restlessness.  He denies any lesions, redness, or tenderness at his last injection site.  His last cigarette use was 3 days ago - using NRT.  Reports not living with his mother but states he is in a good safe environment.  He denies any concern regarding the safety of any prescriptions provided at today's appointment.  Immunization update: patient went to health department on 05/19/14 and got recommended vaccinations done from last appointment.  He is to return in 1 month to get the 2nd part of the hepatis series.    Current Meds: reviewed    Past Medical History   Diagnosis Date   ??? Type II or unspecified type diabetes mellitus without mention of complication, not stated as uncontrolled (HCC)    ??? Neuropathy    ??? Hepatitis C      No past surgical history on file.  History reviewed. No pertinent family history.  History     Social History   ??? Marital Status: Divorced     Spouse Name: N/A     Number of Children: N/A   ??? Years of Education: N/A      Social History Main Topics   ??? Smoking status: Current Every Day Smoker -- 1.00 packs/day for 30 years   ??? Smokeless tobacco: Current User     Types: Chew      Comment: Using NRT + lozenge - last cig 3 days ago   ??? Alcohol Use: 0.0 oz/week     0 Not specified per week      Comment: 6 pack per yr now; former 6-12 pk per day for 10 yrs   ??? Drug Use: 10.00 per week      Comment: last heroin use yesterday - IVDA; using 1-2x per day   ??? Sexual Activity: None     Other Topics Concern   ??? None     Social History Narrative       ROS:  Review of Systems   Respiratory: Negative.    Cardiovascular: Negative.    Gastrointestinal: Positive for nausea. Negative for vomiting and blood in stool.   Genitourinary: Negative.    Skin: Negative.    Psychiatric/Behavioral: Negative for depression and suicidal ideas.       Objective:  Filed Vitals:    06/01/14 1332   BP: 110/72   Pulse: 75   Height: 5\' 10"  (1.778 m)   Weight: 172  lb (78.019 kg)   SpO2: 97%     Physical Exam   Constitutional: He is oriented to person, place, and time. He appears healthy.  Non-toxic appearance.   Improved hygiene appearance from baseline.    Eyes: Conjunctivae and EOM are normal. Pupils are equal, round, and reactive to light.   Neck: No tracheal tenderness present. No tracheal deviation present.   Cardiovascular: Normal rate, regular rhythm, normal heart sounds and intact distal pulses.    Pulmonary/Chest: Effort normal and breath sounds normal. No accessory muscle usage. No respiratory distress.   Abdominal: Soft. Bowel sounds are hyperactive. There is no tenderness.   Musculoskeletal:        Left elbow: He exhibits no swelling. No tenderness found.        Left wrist: He exhibits no tenderness and no swelling.   No erythema/drainage/TTP at sites of former injection.   Lymphadenopathy:     He has no cervical adenopathy.   Neurological: He is alert and oriented to person, place, and time. He displays tremor.   Skin: Skin is warm, dry and intact.    Psychiatric:   Cooperative.  Appropriate.       Assessment & Plan:   1. Heroin withdrawal (HCC)  hydrOXYzine (VISTARIL) 25 MG capsule    loperamide (IMODIUM) 2 MG capsule    traMADol (ULTRAM) 50 MG tablet    Drugs of Abuse, Urine   2. IVDU (intravenous drug user)  Drugs of Abuse, Urine   3. Type 2 diabetes mellitus with diabetic neuropathy (HCC)  Will continue current regimen and consider titration of Effexor dose at next week nurse visit (may increase to 75mg  daily if persistent neuropathy pain).   4. Hepatitis C virus infection without hepatic coma, unspecified chronicity  Patient plans to follow up at the health department to complete his hepatitis A & B series vaccination.  Once patient has been detoxed and sober for 6 months we will check additional studies to see if patient has active infection and if he would be a candidate for hepatitis treatment.     OARRS reviewed and appropriate.  Lengthy discussions with patient at previous appointment regarding the dangers of heroin use.  Patient has shown consistent intent and personal effort regarding his medical care since establishing with office.  He has expressed desire to detox from heroin as his neuropathy is better controlled.  He is aware that if he is unsuccessfully in his attempt to detox he will need to be treated for any subsequent withdrawal/detox episodes at [facility].  He does not with to be placed on chronic methadone at methadone clinic.    Patient expressed full understanding of treatment plan which includes a follow up nurse visit tomorrow and next week to monitor his symptoms and compliance.  He is agreeable to submitting a urine specimen for drug screen.  He confirms that he will not provide any others access to his medications and he understand that he will not receive any additional tramadol prescriptions if this is lost or stolen.    All questions answered.  Medication indications, directions, and side effects were discussed.     Return for nurse  visit tomorrow.    Electronically signed by Ellene RouteNaomi Lessie Manigo, MD on 06/01/14 at 1:42 PM

## 2014-06-01 NOTE — Telephone Encounter (Signed)
Patient is scheduled to see you for a nurse visit tomorrow.  Please check vital signs and ask for an update on patient's symptoms.  He was given enough pills on 06/01/14 to complete the full 5 day taper.     Please call me with an update on his condition.    * He should not need any additional prescriptions *    Full instructions for the Tramadol 50mg  tablet taper.    Day 1: Take 2 tablets by mouth every 4 hours (total dose for 24 hour period)    Day 2: Take 2 tablets by mouth every 6 hours (total dose for 24 hour period)    Day 3: Take 2 tablets by mouth every 8 hours (total dose for 24 hour period)    Day 4: Take 1 tablet by mouth every 6 hours (total dose for 24 hour period)    Day 5: Take 1 tablet by mouth every 8 hours (total dose for 24 hour period)    Day 6: Take 1 tablet by mouth every 12 hours (total dose for 25 hour period)    THEN STOP

## 2014-06-02 LAB — URINE DRUG SCREEN, COMPREHENSIVE
Amphetamine Screen, Ur: NOT DETECTED
Barbiturate Screen, Ur: NOT DETECTED
Benzodiazepine Screen, Urine: NOT DETECTED
Benzoylecgonine Screen, Urine: NOT DETECTED
Methadone Screen, Urine: NOT DETECTED
Oxycodone: NOT DETECTED
PCP Screen, Urine: NOT DETECTED

## 2014-06-02 NOTE — Progress Notes (Signed)
Pt here for medication and vital check. pts vitals were stable and he is feeling well, states he is much better than yesterday. He denies sx, feels like he is well controlled on medications he was placed on yesterday. He did take an amitriptyline last night to help him sleep, he states he feels like it works better than effexor-but causes him to be very drowsy. Spoke with Dr.Tyree and she advises pt not to take amitriptyline and effexor, and advised pt should continue effexor and use vistaril to help him sleep. Pt is to continue regime through out the weekend and follow up on Monday or Tuesday. Pt is agreeable and expresses understanding.

## 2014-06-02 NOTE — Telephone Encounter (Signed)
Pt aware, vitals and documentation in NV

## 2014-06-06 NOTE — Telephone Encounter (Signed)
Pt no showed for nurse visit today, called pt and checked in on him, he states they are moving and he lost track of time and that is why he missed appt. He states he is feeling well, and is okay off of tramadol. i asked pt to come in tomorrow for f/u he was agreeable

## 2014-06-06 NOTE — Telephone Encounter (Signed)
Noted.  Thank you. If he is doing well tomorrow he can just continue his regular follow ups with me as scheduled.

## 2014-06-07 NOTE — Telephone Encounter (Signed)
Pt no showed for appt again today. Spoke with pt, he states he will come in tomorrow, pt sounded well and stated he is well.

## 2014-06-08 ENCOUNTER — Encounter: Admit: 2014-06-08 | Discharge: 2014-06-08 | Payer: MEDICAID | Primary: Internal Medicine

## 2014-06-08 DIAGNOSIS — F1193 Opioid use, unspecified with withdrawal: Secondary | ICD-10-CM

## 2014-06-08 NOTE — Telephone Encounter (Signed)
Pt came in today for NV, pt is doing well. Vitals were stable, he states he has not used heroin and he handled withdraw well-no complications or issues. He would like to know if his effexor may be increased? He states he feels like it is working but still having some burning in his feet, he thinks an increase will work to completely get rid of sx. He also does not have a f/u scheduled at this time and last note does not specify when to return aside from NV, please advise

## 2014-06-08 NOTE — Telephone Encounter (Signed)
Good to hear.  He can increase his effexor to take 2 tablets of his current 37.5mg  tablets per day (total dose will be 75mg  per day).  When he finishes his current prescription we will send in the new prescription as Effexor ER 75mg  daily.    Please schedule him for a 2 month follow up.    Thanks

## 2014-06-08 NOTE — Progress Notes (Signed)
Pt here for NV, he is doing well, he denies using heroin and states he did not have trouble withdrawing off. He will f/u as scheduled.

## 2014-06-08 NOTE — Telephone Encounter (Signed)
Also his PCP is not listed as [facility], [name] - please clarfiy and change if appropriate.  He should only be seeing one PCP.    Thanks

## 2014-06-09 NOTE — Telephone Encounter (Signed)
PCP updated, pt aware to increase medication, will call for f/u once December schedule is available.

## 2014-06-13 MED ORDER — VENLAFAXINE HCL 75 MG PO TABS
75 MG | ORAL_TABLET | Freq: Every day | ORAL | Status: DC
Start: 2014-06-13 — End: 2014-10-27

## 2014-06-13 NOTE — Telephone Encounter (Signed)
Pt left VM that he would like rx for effexor 75mg  daily called into pharm please

## 2014-08-28 NOTE — Telephone Encounter (Signed)
lmtco and schedule appt

## 2014-08-28 NOTE — Telephone Encounter (Signed)
Please call and schedule patient for a routine follow up sometime between now and the first week of February.  I want to make sure he is doing well on his current medications.

## 2014-09-11 ENCOUNTER — Encounter

## 2014-09-11 ENCOUNTER — Inpatient Hospital Stay: Admit: 2014-09-11 | Discharge: 2014-09-11 | Disposition: A | Discharge status: Home / Self Care | Attending: Hospitalist

## 2014-09-11 MED ORDER — METFORMIN HCL 500 MG PO TABS
500 MG | ORAL_TABLET | Freq: Two times a day (BID) | ORAL | Status: DC
Start: 2014-09-11 — End: 2014-12-12
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
## 2014-09-11 NOTE — Telephone Encounter (Signed)
Pharm called in and requested rf of elavil and metformin, elavil was changed to effexor, so i did not place rf request.   Metformin was last rf'ed on 05/02/14, no future appt scheduled at this time, i have left pt a VM to schedule appt and i asked pharm to advise pt he need appt for f/u as medications may not be rf'ed as he needs OV

## 2014-09-11 NOTE — Telephone Encounter (Signed)
lmtco and schedule appt

## 2014-09-13 NOTE — Telephone Encounter (Signed)
Tried calling pts mother, and was told i had the wrong number.   lmtco on pts phone

## 2014-09-13 NOTE — Telephone Encounter (Signed)
Please call patient and check to see how he is doing.  I got a notification that patient was in the ED for heroin use.

## 2014-09-18 NOTE — Telephone Encounter (Signed)
Noted thanks, no further action needed at this time.

## 2014-09-18 NOTE — Telephone Encounter (Signed)
Noted, no further action needed until he calls back.

## 2014-09-21 ENCOUNTER — Inpatient Hospital Stay: Admit: 2014-09-21 | Discharge: 2014-09-21 | Disposition: A | Discharge status: Home / Self Care

## 2014-10-27 MED ORDER — VENLAFAXINE HCL 75 MG PO TABS
75 MG | ORAL_TABLET | Freq: Every day | ORAL | Status: DC
Start: 2014-10-27 — End: 2015-06-26

## 2014-10-27 NOTE — Telephone Encounter (Signed)
Last rf was 06/13/14, next appt is 11/03/14

## 2014-11-03 ENCOUNTER — Ambulatory Visit: Admit: 2014-11-03 | Discharge: 2014-11-03 | Payer: MEDICAID | Attending: Internal Medicine | Primary: Internal Medicine

## 2014-11-03 DIAGNOSIS — E1140 Type 2 diabetes mellitus with diabetic neuropathy, unspecified: Secondary | ICD-10-CM

## 2014-11-03 LAB — POCT GLYCOSYLATED HEMOGLOBIN (HGB A1C): Hemoglobin A1C: 6.1 %

## 2014-11-03 NOTE — Progress Notes (Signed)
Subjective:  Mason Garza is a 49 y.o. male who presents with chief complaint of Check-Up      HPI:  Patient presents for follow up with CC: dizzy spell episodes associated with BGTs 50s; complaints of digit pain.    DM2: controlled on metformin; due for repeat A1c today.    Lab Results   Component Value Date    LABA1C 6.1 11/03/2014    LABA1C 6.5* 04/27/2014     Lab Results   Component Value Date    LABMICR <0.500 04/27/2014    LDLCALC 45 04/27/2014    CREATININE 0.85 04/27/2014      No bgt records brought to appointment.  Patient reports that he has been living with his mom for the last ?few months - prior to that he noticed that he was having episodes where he would get dizzy and sometimes checked his BGT and it was usually in the 50s per his report.  He provides poor history regarding his meal intake around that time.   Dizzy spells; bgt usually in 50s when it happens, improved with eating.  When questioned he reported the episodes seemed to resolve shortly after moving in with his mother.  He reports he is eating much better food/regular meals now and has not noted any recent episodes.    Neuropathy: likey due to DM2  Burning pain in the feet is doing better with the effexor.  He reports over the last 1.5 months he is noticing episodes when his feet/lower legs and sometimes all finger tips go numb.  Believes this is associated with prolonged standing or walking but not sure.  .  Pain in toes/calves worse with exercise; balls of foot and into the calf.  No known history of PVD/ABI testing.   Some improvement with rest and getting off his feet.  Tips of fingers feel numb feels like loosing fine sensation on the finger tips.  Symptoms seem worse with sleeping; both hands equally bothersome.  Patient is seeing a podiatrist and has diabetic insoles; he is past due for a follow up appointment with podiatry and has thickened cracked calluses and thick nails.  He notes that around the nail it can get red and  tender.    Noted ED visit 09/11/14 for IVDU  History of Hep C Ab +  Reviewed hospital records: in 04/2011 patient was admitted to the hospital and was found to have a + hep C antibody; Hep A/B/HIV/RPR negative.  No known immunizations and repeat Hepatitis B and HIV testing in 04/2014 was negative.      Preventative Health: reviewed chart; confirmed from record review pneumovax given [facility]    Current Meds: reviewed    Past Medical History   Diagnosis Date   ??? Type II or unspecified type diabetes mellitus without mention of complication, not stated as uncontrolled (HCC)    ??? Neuropathy    ??? Hepatitis C antibody test positive      No past surgical history on file.  History reviewed. No pertinent family history.  History     Social History   ??? Marital Status: Divorced     Spouse Name: N/A     Number of Children: N/A   ??? Years of Education: N/A     Social History Main Topics   ??? Smoking status: Current Every Day Smoker -- 1.00 packs/day for 30 years   ??? Smokeless tobacco: Current User     Types: Chew   ???  Alcohol Use: 0.0 oz/week     0 Not specified per week      Comment: 6 pack per yr now; former 6-12 pk per day for 10 yrs   ??? Drug Use: 10.00 per week      Comment: last heroin use yesterday - IVDA; using 1-2x per day   ??? Sexual Activity: None     Other Topics Concern   ??? None     Social History Narrative       ROS:  Review of Systems   Constitutional: Positive for malaise/fatigue. Negative for fever, chills, weight loss and diaphoresis.   HENT: Negative.    Eyes: Negative.    Respiratory: Negative.    Cardiovascular: Negative.    Gastrointestinal: Negative.    Genitourinary: Positive for frequency. Negative for dysuria, urgency, hematuria and flank pain.   Musculoskeletal: Negative for myalgias.   Skin: Negative.    Neurological: Negative for dizziness, tingling, loss of consciousness and weakness.   Endo/Heme/Allergies: Positive for polydipsia. Does not bruise/bleed easily.        Objective:  Filed Vitals:    11/03/14 1524   BP: 122/70   Pulse: 63   Height:  (1.778 m)   Weight: 188 lb 4.8 oz (85.412 kg)   SpO2: 98%     Physical Exam   Constitutional: He is oriented to person, place, and time and well-developed, well-nourished, and in no distress. Vital signs are normal.   Noted scars on arms consistent with track marks   HENT:   Head: Normocephalic and atraumatic.   Mouth/Throat: Oropharynx is clear and moist.   Eyes: Conjunctivae and EOM are normal. Pupils are equal, round, and reactive to light.   Neck: Neck supple. No muscular tenderness present. No thyroid mass present.   Cardiovascular: Normal rate, regular rhythm, S1 normal and S2 normal.  Exam reveals no gallop and no friction rub.    No murmur heard.  Pulses:       Radial pulses are 2+ on the right side, and 2+ on the left side.        Dorsalis pedis pulses are 1+ on the right side, and 1+ on the left side.        Posterior tibial pulses are 2+ on the right side, and 2+ on the left side.   Pulmonary/Chest: Effort normal and breath sounds normal.   Abdominal: Soft. Bowel sounds are normal. He exhibits no mass. There is no tenderness.   Neurological: He is alert and oriented to person, place, and time. He has intact cranial nerves. Gait normal.   Skin: Skin is warm and dry.   DM FOOT EXAM  Inspection: thickened areas of callus formation with dryness and cracking; areas of mild erythema over the nail bed digits; + nail changes greatest on 1st and 5th digit consistent with fungal infection; + abnormal monofilament exam BL digits 1-3.  1+ pedal pulses      Psychiatric: Mood and affect normal.       Assessment & Plan:   1. Type 2 diabetes mellitus with diabetic neuropathy (HCC)  POCT glycosylated hemoglobin (Hb A1C)   2. Foot pain, bilateral  VL Arterial PVR Lower   3. Nail dystrophy  Fungus Culture   4. Hepatitis C antibody test positive     5. IVDU (intravenous drug user)       DM2 controlled.  Patient given BGT log to record  and bring to next appointment.  Given educational HO regarding hypoglycemia.  Suspect his dizzy/low bgt episodes may have been related to poor regular diet intake; no further episodes recently.  Will monitor for now and if symptoms require will consider alternative diagnostics.  Patients extremity pain is most likely related to neuropathy; however, given the HPI above will rule out vascular claudication.  Patient likely has onychomycosis which may be contributing to his foot discomfort - will confirm diagnosis and treat if positive.  Encouraged fu with podiatry.  Reviewed hospital immunization record and lab results.  Patient has not been confirmed active Hep C - will draw lab work at follow up to confirm his status and will vaccinate for Hep A/B.  Patient is interested in Hep C treatment; encouraged IVDU cessation.    Return in about 1 month (around 12/04/2014).    Electronically signed by Ellene Route, MD on 11/03/14 at 4:26 PM

## 2014-12-12 ENCOUNTER — Ambulatory Visit: Admit: 2014-12-12 | Discharge: 2014-12-12 | Payer: MEDICAID | Attending: Internal Medicine | Primary: Internal Medicine

## 2014-12-12 DIAGNOSIS — E1140 Type 2 diabetes mellitus with diabetic neuropathy, unspecified: Secondary | ICD-10-CM

## 2014-12-12 MED ORDER — METFORMIN HCL 500 MG PO TABS
500 MG | ORAL_TABLET | Freq: Every day | ORAL | Status: DC
Start: 2014-12-12 — End: 2015-06-26

## 2014-12-12 MED ORDER — GABAPENTIN 100 MG PO CAPS
100 MG | ORAL_CAPSULE | Freq: Every day | ORAL | Status: DC
Start: 2014-12-12 — End: 2015-02-26

## 2014-12-12 NOTE — Patient Instructions (Signed)
Diabetic Neuropathy: Care Instructions  Your Care Instructions  When you have diabetes, your blood sugar level may get too high. Over time, high blood sugar levels can damage nerves. This is called diabetic neuropathy.  Nerve damage can cause pain, burning, tingling, and numbness and may leave you feeling weak. The feet are often affected. When you have nerve damage in your feet, you cannot feel your feet and toes as well as normal and may not notice cuts or sores. Even a small injury can lead to a serious infection. It is very important that you follow your doctor's advice on foot care.  Sometimes diabetes damages nerves that help the body function. If this happens, your blood pressure, sweating, digestion, and urination might be affected. Your doctor may give you a target blood sugar level that is higher or lower than you are used to. Try to keep your blood sugar very close to this target level to prevent more damage.  Follow-up care is a key part of your treatment and safety. Be sure to make and go to all appointments, and call your doctor if you are having problems. It???s also a good idea to know your test results and keep a list of the medicines you take.  How can you care for yourself at home?  ?? Take your medicines exactly as prescribed. Call your doctor if you think you are having a problem with your medicine. It is very important that you take your insulin or diabetes pills as your doctor tells you.  ?? Try to keep blood sugar at your target level.  ?? Eat a variety of healthy foods, with carbohydrate spread out in your meals. A dietitian can help you plan meals.  ?? Try to get at least 30 minutes of exercise on most days.  ?? Check your blood sugar as many times each day as your doctor recommends.  ?? Take and record your blood pressure at home if your doctor tells you to. Learn the importance of the two measures of blood pressure (such as 130 over 80, or 130/80). To take your blood pressure at home:  ?? Ask  your doctor to check your blood pressure monitor to be sure it is accurate and the cuff fits you. Also ask your doctor to watch you to make sure that you are using it right.  ?? Do not use medicine known to raise blood pressure (such as some nasal decongestant sprays) before taking your blood pressure.  ?? Avoid taking your blood pressure if you have just exercised or are nervous or upset. Rest at least 15 minutes before you take a reading.  ?? Take pain medicines exactly as directed.  ?? If the doctor gave you a prescription medicine for pain, take it as prescribed.  ?? If you are not taking a prescription pain medicine, ask your doctor if you can take an over-the-counter medicine.  ?? Do not smoke. Smoking can increase your chance for a heart attack or stroke. If you need help quitting, talk to your doctor about stop-smoking programs and medicines. These can increase your chances of quitting for good.  ?? Limit alcohol to 2 drinks a day for men and 1 drink a day for women. Too much alcohol can cause health problems.  ?? Eat small meals often, rather than 2 or 3 large meals a day.  To care for your feet  ?? Prevent injury by wearing shoes at all times, even when you are indoors.  ?? Do foot   care as part of your daily routine. Wash your feet and then rub lotion on your feet, but not between your toes. Use a handheld mirror or magnifying mirror to inspect your feet for blisters, cuts, cracks, or sores.  ?? Have your toenails trimmed and filed straight across.  ?? Wear shoes and socks that fit well. Soft shoes that have good support and that fit well (such as tennis shoes) are best for your feet.  ?? Check your shoes for any loose objects or rough edges before you put them on.  ?? Ask your doctor to check your feet during each visit. Your doctor may notice a foot problem you have missed.  ?? Get early treatment for any foot problem, even a minor one.  When should you call for help?  Call your doctor now or seek immediate medical  care if:  ?? You have a sore or any type of injured skin on your toes or feet.  ?? Your feet have blue or black areas, which can mean bruising or blood flow problems.  ?? You have peeling skin or tiny blisters between your toes, or cracking or oozing of the skin.  ?? You have new numbness or tingling in your feet that does not go away after you move your feet or change positions.  Watch closely for changes in your health, and be sure to contact your doctor if:  ?? You want help with foot care or cutting your toenails.  ?? You have frequent problems with high or low blood sugar levels. Your medicines or your insulin may need to be changed.   Where can you learn more?   Go to [email or web address] and sign in to your MyChart account. Enter V828 in the Search Health Information box to learn more about ???Diabetic Neuropathy: Care Instructions.???    If you do not have an account, please click on the ???Sign Up Now??? link.     ?? 2006-2015 [employer]. Care instructions adapted under license by [facility]. This care instruction is for use with your licensed healthcare professional. If you have questions about a medical condition or this instruction, always ask your healthcare professional. Healthwise, Incorporated disclaims any warranty or liability for your use of this information.  Content Version: 10.6.465758; Current as of: Jan 06, 2014
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
## 2014-12-14 NOTE — ED Provider Notes (Signed)
[name]:          Barbaro, Tipton R       DOS:           12/14/2014  MR #:             [id number]             ACCOUNT #:     [id number]  DATE OF BIRTH:    1965/11/13              AGE:           48      HISTORY OF PRESENT ILLNESS:    PERTINENT HISTORY OF PRESENT  ILLNESS. Patient presents for hives.  He has  had symptoms for a week.  He is an IV drug abuser, and there may be  something  in the heroin he is using to cause a high speed.  He is not on any  new  medications, and he denies any other new contacts.  He has no fever or  systemic  symptoms.  Denies any eye or mouth involvement.  Denies any skin  peeling or  other skin changes.        PHYSICAL EXAM Patient has diffuse wheals over his body.  Skin is  intact.  There is no sign of overlying infection.  Mucous membranes are  normal.  Conjunctivae are normal.  He is in no respiratory distress.  Speaking  in full  sentences without difficulty.  Breathing comfortably.  Moving without  difficulty.    MEDICAL DECISION MAKING:    SIGNIFICANT FINDINGS/ED COURSE/MEDICAL DECISION MAKING/TREATMENT  PLAN Patient  presents with urticaria.  He was treated with Benadryl, Pepcid, and  prednisone.   He was advised that his symptoms will continue if he continues to use  this  formulation of heroin.  He was advised to discontinue this batch.  He  should  return if he has new issues, difficulty breathing, difficulty talking  or  swallowing, fever, or any other problems.    PROBLEM LIST:       ED Diagnosis:     Urticaria (L50.9): Entered Date: 14-Dec-2014 21:52, Entered By:  Oscar LaELIK,  Ottie Tillery, Status: Active, ICD-10: L50.9      COPIES SENT TO::     Ellene RouteYREE, NAOMI L(PCP): [phone]    Electronic Signatures:  Oscar LaELIK, Yohance Hathorne (MD)  (Signed 14-Dec-2014 21:52)   Authored: HISTORY OF PRESENT ILLNESS, PHYSICAL EXAM, MEDICAL DECISION  MAKING,  PROBLEM LIST, Copies to be sent to:      Last Updated: 14-Dec-2014 21:52 by Oscar LaELIK, Katja Blue (MD)            Please see T-Sheet, initial assessment, and  physician orders for  further details.    Dictating Physician: Alferd Apaaniel H Juluis Fitzsimmons, MD  Original Electronic Signature Date: 12/14/2014 09:52 P  [facility]  Document #: [id number]    cc:  Amalia GreenhouseNaomi L Tyree, MD       [facility]       [address]       [place] [id number]

## 2015-02-22 ENCOUNTER — Encounter

## 2015-02-22 MED ORDER — PREGABALIN 50 MG PO CAPS
50 MG | ORAL_CAPSULE | Freq: Three times a day (TID) | ORAL | 0 refills | Status: DC
Start: 2015-02-22 — End: 2015-03-07

## 2015-02-26 ENCOUNTER — Encounter

## 2015-02-26 NOTE — Telephone Encounter (Signed)
[name]-  Please have Dr. [name] look at this when she is back next week.  Thanks!  Mason MilletMegan

## 2015-02-26 NOTE — Telephone Encounter (Signed)
Last rf was 12/12/14, next appt is 03/19/15, pt states he discussed increasing gabapentin with dr.tyree and states he could use the increase, please advise

## 2015-02-26 NOTE — Telephone Encounter (Signed)
i also have forms to be completed to attempt to get pt free lyrica, however the form request a 90 day supply and my printed rx is for 30 days, please reprint and sign

## 2015-03-05 NOTE — Telephone Encounter (Signed)
We need to clarify this.  Where is the form/prescription currently.

## 2015-03-07 MED ORDER — GABAPENTIN 100 MG PO CAPS
100 MG | ORAL_CAPSULE | Freq: Every day | ORAL | 0 refills | Status: DC
Start: 2015-03-07 — End: 2015-03-08

## 2015-03-07 MED ORDER — PREGABALIN 50 MG PO CAPS
50 MG | ORAL_CAPSULE | Freq: Three times a day (TID) | ORAL | 0 refills | Status: DC
Start: 2015-03-07 — End: 2015-06-05

## 2015-03-07 NOTE — Telephone Encounter (Signed)
Will give short term gabapentin fill until assist program for lyrica approved.  90 day lyrica prescription written. Patient aware he should not be taking both prescriptions.

## 2015-03-08 MED ORDER — GABAPENTIN 100 MG PO CAPS
100 MG | ORAL_CAPSULE | Freq: Three times a day (TID) | ORAL | 0 refills | Status: DC
Start: 2015-03-08 — End: 2015-03-19

## 2015-03-08 NOTE — Telephone Encounter (Signed)
Pt has a question on RX gabapentin (NEURONTIN) 100 MG capsule Take 1 capsule by mouth daily.  Pt stated he was taking 3 capsules daily,  then he said you were supposed to increase the gabapentin. I explained to pt that RX states 1 caps by mouth daily and I will check with Dr Olivia Mackie about the increase and verify how many should be taken daily.  Please advise.  Thank you

## 2015-03-08 NOTE — Telephone Encounter (Signed)
If he has been taking 1 capsule three times daily he can continue to do so for now.  I do not want to increase the dose as patient wants to switch to lyrica and I filled out the forms and I anticipate it will be approved.     If he does not hear back about the lyrica within 1 week he should let us know and I will send in additional gabapentin prescription.

## 2015-03-09 NOTE — Telephone Encounter (Signed)
lvmtcb

## 2015-03-13 NOTE — Telephone Encounter (Signed)
Called pt lvmtcb

## 2015-03-19 ENCOUNTER — Ambulatory Visit: Admit: 2015-03-19 | Discharge: 2015-03-19 | Payer: MEDICAID | Attending: Internal Medicine | Primary: Internal Medicine

## 2015-03-19 DIAGNOSIS — E1140 Type 2 diabetes mellitus with diabetic neuropathy, unspecified: Secondary | ICD-10-CM

## 2015-03-19 MED ORDER — FREESTYLE SYSTEM KIT
PACK | Freq: Every day | 0 refills | Status: DC | PRN
Start: 2015-03-19 — End: 2017-06-07

## 2015-03-19 MED ORDER — GABAPENTIN 300 MG PO CAPS
300 MG | ORAL_CAPSULE | Freq: Three times a day (TID) | ORAL | 0 refills | Status: DC
Start: 2015-03-19 — End: 2015-06-26

## 2015-03-19 NOTE — Progress Notes (Signed)
Subjective:  Mason Garza is a 49 y.o. male who presents with chief complaint of 3 Month Follow-Up      HPI:  Patient presents for routine follow up and concerns regarding neuropathy.    DM  Blood sugars doing a little better with the weight loss.  Eating regularly.  Blood sugars in the 130s.  Lost glucometer.  Just needs machine; has test supplies at home.  No low blood sugars.  Highest 150s.  Not sure when last eye exam was ? If within the year.  Also needs to see the dentist.    Neuropathy: still having the burning and shock pain.  Doesn't feel like the effexor is helping but is still taking as prescribed.  Taking gabapenting 148m TID, can feel the pain easing.  Would like to increase dose.      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 Reports he completed the court appointed detox program through [facility].  States the counselor that he is seeing through the program recommended he enroll in the methadone program or get enrolled for lyrica.  Forms have already been completed and sent; still waiting to hear back from the lyrica free program.      Hepatitis Immunization: received hep a/b first dose and tdap at [facility]  Need to confirm dates.  Past due to follow up for 2nd hep b vaccine.    Current Meds: reviewed    Past Medical History   Diagnosis Date   ??? H/O echocardiogram 05/12/11     EF62%, mild LAE   ??? Hepatitis C antibody test positive 05/10/11   ??? Neuropathy    ??? Type II or unspecified type diabetes mellitus without mention of complication, not stated as uncontrolled        ROS:  Review of Systems   Constitutional: Negative for chills, fever and malaise/fatigue.   Respiratory: Negative.    Cardiovascular: Negative.    Gastrointestinal: Negative.    Genitourinary: Negative.    Neurological:        + neuropathy BL LE   Psychiatric/Behavioral: Positive for substance abuse.       Objective:  Vitals:    03/19/15 1330   BP: 118/70   Site: Right Arm   Position: Sitting   Cuff Size: Medium Adult    Pulse: 98   SpO2: 98%   Weight: 179 lb 11.2 oz (81.5 kg)   Height: _0  (1.778 m)     Physical Exam   Constitutional: He is oriented to person, place, and time and well-developed, well-nourished, and in no distress.   HENT:   Mouth/Throat: Uvula is midline, oropharynx is clear and moist and mucous membranes are normal. Abnormal dentition.   Eyes: Conjunctivae and EOM are normal. Pupils are equal, round, and reactive to light.   Neck: Neck supple.   Cardiovascular: Normal rate, regular rhythm, normal heart sounds and normal pulses.    Pulmonary/Chest: Effort normal and breath sounds normal.   Abdominal: Soft. Bowel sounds are normal. There is no tenderness.   Neurological: He is alert and oriented to person, place, and time.   Psychiatric: Mood and affect normal.       Assessment & Plan:   1. Type 2 diabetes mellitus with diabetic neuropathy, without long-term current use of insulin (HCC)   Chronic problem, controlled.  Encouraged to schedule annual diabetic eye exam and dental appointment.  Will recheck a1c at next appointment.  glucose monitoring kit (FREESTYLE) monitoring kit  Neuropathy  Chronic problem.  Plan to increase gabapentin for now.  Will transition to lyrica once it becomes available through program. gabapentin (NEURONTIN) 300 MG capsule   2. Need for hepatitis A and B vaccination       Patient reports [facility]  Will need to request the records.  Past due for hep b second dose.    Medication indications, directions, and side effects were discussed.     Return in about 3 months (around 06/19/2015) for prediabetes, recheck A1c, neuropathy.    Electronically signed by Omelia Blackwater, MD on 03/19/15 at 2:08 PM

## 2015-03-19 NOTE — Patient Instructions (Signed)
Please make sure you have seen the eye doctor for a diabetic eye exam within the last 12 months and ask them to send us a copy of the exam results.  You should also see the dentist and follow up with the health department for the second dose of your hepatitis B vaccination.

## 2015-03-19 NOTE — Telephone Encounter (Signed)
Pt in for OV on 03/19/15. To discuss.

## 2015-06-26 ENCOUNTER — Telehealth

## 2015-06-26 ENCOUNTER — Ambulatory Visit: Admit: 2015-06-26 | Discharge: 2015-06-26 | Payer: MEDICAID | Attending: Internal Medicine | Primary: Internal Medicine

## 2015-06-26 DIAGNOSIS — Z0000 Encounter for general adult medical examination without abnormal findings: Secondary | ICD-10-CM

## 2015-06-26 LAB — POCT GLYCOSYLATED HEMOGLOBIN (HGB A1C): Hemoglobin A1C: 6.9 %

## 2015-06-26 MED ORDER — TRAZODONE HCL 50 MG PO TABS
50 MG | ORAL_TABLET | Freq: Every evening | ORAL | 3 refills | Status: DC
Start: 2015-06-26 — End: 2015-09-28

## 2015-06-26 MED ORDER — GLUCOSE BLOOD VI STRP
Freq: Every day | 3 refills | Status: DC
Start: 2015-06-26 — End: 2015-09-28

## 2015-06-26 MED ORDER — METFORMIN HCL 500 MG PO TABS
500 MG | ORAL_TABLET | Freq: Two times a day (BID) | ORAL | 5 refills | Status: DC
Start: 2015-06-26 — End: 2015-09-28

## 2015-06-26 MED ORDER — GABAPENTIN 300 MG PO CAPS
300 MG | ORAL_CAPSULE | Freq: Three times a day (TID) | ORAL | 3 refills | Status: DC
Start: 2015-06-26 — End: 2015-09-28

## 2015-06-26 NOTE — Progress Notes (Signed)
Subjective:  Mason Garza is a 49 y.o. male who presents with chief complaint of 3 Month Follow-Up      HPI:  Patient presents for routine wellness exam; concerns regarding depression, neuropathy.    Diabetes:  Last eye exam has been awhile: went to [facility].  Will go back there again or go to the [facility] if mom goes for cataract.    BGT at home not checking maybe a few times per week - running around 130; a few have been around 60.    Lab Results   Component Value Date    LABA1C 6.9 06/26/2015     No results found for: EAG    Feet same.  Still burning stinging pain ran out of gabapentin .  Insurance ran out but got it restarted.  Out of gabapentin for a few weeks.  Seemed to help but was still having significant symptoms.  Leg cramps at night.    Last use of IVDU about 2 weeks.  Denies any withdrawal symptoms. Aware of the risk of serious infection and life threatening overdose with continued use.  Reports he will stop using as soon as pain is better controlled.      Depression: cant recall meds in the past.  Ran out of effexor about 1 week ago - states no difference off the medication.  Sleep is an issue.   Marland Kitchen  PHQ9 TESTING 06/26/2015   PHQ9 Doc Flowsheet 15     Interpretation of Total Score Depression Severity: 1-4 = Minimal depression, 5-9 = Mild depression, 10-14 = Moderate depression, 15-19 = Moderately severe depression, 20-27 = Severe depression      Immunizations: due for influenza vaccination today.  Can't remember regarding the dates of hep A&B immunization    Current Meds:   Current Outpatient Prescriptions on File Prior to Visit   Medication Sig Dispense Refill   ??? glucose monitoring kit (FREESTYLE) monitoring kit 1 kit by Does not apply route daily as needed (test blood sugar as needed) May have any glucometer that is covered by patient's insurance. 1 kit 0   ??? Lancets MISC Check blood sugars daily or when symptoms develop. 100 each 3   ??? Lancet Devices (B-D LANCET DEVICE) MISC  Check blood sugars daily or when symptoms develop. 1 each 0     No current facility-administered medications on file prior to visit.        Past Medical History   Diagnosis Date   ??? H/O echocardiogram 05/12/11     EF62%, mild LAE   ??? Hepatitis C antibody test positive 05/10/11   ??? Neuropathy ([place])    ??? Type II or unspecified type diabetes mellitus without mention of complication, not stated as uncontrolled      Social History     Social History   ??? Marital status: Divorced     Spouse name: N/A   ??? Number of children: N/A   ??? Years of education: N/A     Social History Main Topics   ??? Smoking status: Current Every Day Smoker     Packs/day: 1.00     Years: 30.00   ??? Smokeless tobacco: Current User     Types: Chew   ??? Alcohol use 0.0 oz/week     0 Standard drinks or equivalent per week      Comment: 6 pack per yr now; former 6-12 pk per day for 10 yrs   ??? 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 Sexual activity: Not Asked     Other Topics Concern   ??? None     Social History Narrative    Living with mother now - improved meal intake and assistance with DM care as mother is a former Therapist, sports       ROS:  Review of Systems   Constitutional: Positive for malaise/fatigue. Negative for chills, diaphoresis, fever and weight loss.   Eyes: Negative.    Respiratory: Negative.    Cardiovascular: Negative.    Gastrointestinal: Negative.    Genitourinary: Negative.    Musculoskeletal:        + BL LE burning pain   Skin: Negative.    Neurological: Positive for tingling. Negative for weakness.   Psychiatric/Behavioral: Positive for depression and substance abuse. Negative for hallucinations, memory loss and suicidal ideas. The patient has insomnia. The patient is not nervous/anxious.        Objective:  Vitals:    06/26/15 1332   BP: 130/72   Site: Right Arm   Position: Sitting   Cuff Size: Medium Adult   Pulse: 86   SpO2: 97%   Weight: 187 lb (84.8 kg)   Height: 5' 10"  (1.778 m)      Physical Exam   Constitutional: He is oriented to person, place, and time and well-developed, well-nourished, and in no distress.   HENT:   Mouth/Throat: Uvula is midline, oropharynx is clear and moist and mucous membranes are normal. Abnormal dentition.   Eyes: Conjunctivae and EOM are normal. Pupils are equal, round, and reactive to light.   Neck: Neck supple.   Cardiovascular: Normal rate, regular rhythm, normal heart sounds and normal pulses.    Pulmonary/Chest: Effort normal and breath sounds normal.   Abdominal: Soft. Bowel sounds are normal. He exhibits no mass. There is no tenderness.   Musculoskeletal: He exhibits no edema.   Neurological: He is alert and oriented to person, place, and time.   Skin: Skin is warm and dry.   Psychiatric: Mood and affect normal.       Assessment:  1. Wellness examination  Comprehensive Metabolic Panel    CBC Auto Differential   2. Type 2 diabetes mellitus with diabetic neuropathy, without long-term current use of insulin (HCC)  metFORMIN (GLUCOPHAGE) 500 MG tablet    gabapentin (NEURONTIN) 300 MG capsule    glucose blood VI test strips (ACURA BLOOD GLUCOSE TEST) strip    Microalbumin / Creatinine Urine Ratio    POCT glycosylated hemoglobin (Hb A1C)   3. Need for influenza vaccination  INFLUENZA, QUADRAVALENT, IM, PF (FLUZONE QUADRIVALENT PF)   4. Depression, unspecified depression type  traZODone (DESYREL) 50 MG tablet   5. Insomnia, unspecified type  traZODone (DESYREL) 50 MG tablet   6. Hepatitis C antibody test positive  HEP C RNA, QUANT (VIRAL LOAD)   7. Screening for lipid disorders  Lipid Panel   8. Need for hepatitis B vaccination  Hepatitis B Vaccine Adult IM - Recombivax HB       Plan:   1. Due for fasting labs.  2. Chronic problem worsening but still controlled.  Increase metformin to twice daily.  Will continue gabapentin for now for his chronic neuropathy.  Trying to get lyrica approved - patient did not complete the entire form previously; after visit patient  requested trial of Rx to be sent to pharmacy to see if it will be approved now with recent insurance  changes.  Will send.  Encouraged to schedule DM eye exam and have note sent to office.  3. Agreeable to receive today.  4. Chronic problem patient reports worsening.  On the basis of positive PHQ-9 screening (PHQ-9 Total Score: 15), the following plan was implemented: medication prescribed: sleep aid- trazodone - will titrate.  Patient no longer taking effexor.  Will avoid starting multiple medications at same time.  Question if related to underlying hepatitis C Ab +- patient will call for any significant medication side effects or worsening symptoms of depression.  Patient will follow-up in 3 month(s) with PCP.  5. See above.  6. Chronic problem, patient is still actively using IVDU last ~1.5 weeks ago.  Due to persistent issues with neuropathy and depression will formally check to see if chronic viral infection.    7. Due for repeat screening.  8. Past due for hepatitis B immunization today.    Medication indications, directions, and side effects were discussed.     Return in about 3 months (around 09/26/2015) for checkup on depression and diabetes, neuropathy.    Dictated using Dragon Naturally Speaking Medical Version 2.4  Proof read however unrecognized voice recognition errors may have occurred.    Electronically signed by Omelia Blackwater, MD on 06/26/15 at 2:01 PM

## 2015-06-26 NOTE — Patient Instructions (Signed)
Please schedule your diabetic eye exam intake or business card and requests that they fax us a copy of the note.    Please get fasting lab work done within the next week or so.    We will increase her dose of metformin to twice daily.  We discontinued the Effexor and we will trial trazodone at nighttime.    We will continue your gabapentin for now and request a prior authorization on your Lyrica.    We will contact the health department and determine if you are due for any follow-up immunizations.

## 2015-06-26 NOTE — Telephone Encounter (Signed)
Pt is requesting lyrica rx be sent to pharm, states he does have [place] has [place] medicaid, please advise

## 2015-06-28 MED ORDER — PREGABALIN 50 MG PO CAPS
50 MG | ORAL_CAPSULE | Freq: Three times a day (TID) | ORAL | 0 refills | Status: DC
Start: 2015-06-28 — End: 2015-08-02

## 2015-06-28 NOTE — Telephone Encounter (Signed)
Prescription printed and signed.

## 2015-06-28 NOTE — Telephone Encounter (Signed)
rx called into pharm

## 2015-06-29 NOTE — Telephone Encounter (Signed)
Received PA for lyrica, will initiate.

## 2015-06-29 NOTE — Telephone Encounter (Signed)
PA completed via phone [phone], should hear response in 24-48 hours

## 2015-07-02 NOTE — Telephone Encounter (Signed)
PA approved, pt aware

## 2015-07-23 NOTE — ED Provider Notes (Signed)
PATIENTDAYMEIN, NUNNERY       DOS:           07/23/2015  MR #:             [id number]             ACCOUNT #:     [id number]  DATE OF BIRTH:    1966-03-07              AGE:           49      HISTORY OF PRESENT ILLNESS:    PERTINENT HISTORY OF PRESENT  ILLNESS. Seen with Dr. Christain [place].  Here for left wrist and left hand abscesses.  Patient injects IV drugs  of both  of these sites.  He noticed swelling and abscess formation on the  radial aspect  of the left wrist roughly one week ago.  It is since increased in  size.  He  injects IV heroin.  Last injected today in his left antecubital fossa.  He has  a history of multiple abscesses prior injection sites.  He is a type  II  diabetic.  Denies fevers, chills, shortness of breath, chest pain.  No  other  symptoms or complaints.    PERTINENT PAST/ FAMILY/SOCIAL HISTORY IV drug use  1 pack per day smoker  type 2 diabetes        PHYSICAL EXAM Vital Signs: Patient afebrile. Vital signs stable.  Gen: Alert, responding appropriately  CV: Regular rate and rhythm, no murmur. Normal S1 and S2. 2+  peripheral pulses  all 4 extremities  Resp: CTA bilaterally, no crackles, wheezing, or rhonchi  Skin: 3 cm x 3 cm abscess radial aspect left wrist.  1 cm x 1 cm  abscess thenar  eminence left hand.  No overlying cellulitis.  The remainder of the physical exam was unremarkable.    MEDICAL DECISION MAKING:    SIGNIFICANT FINDINGS/ED COURSE/MEDICAL DECISION MAKING/TREATMENT  PLAN Left  wrist and hand abscesses.  History of IV drug use.  Imaging studies  show no  evidence of osteomyelitis.  Lidocaine infiltrated over abscess site.  Both  abscesses were incised and drained using cruciate incisions.  Approximately 4  mL of purulent discharge drained from left wrist abscess.  2 mL of  purulent  discharge drained from left hand abscess.  Sterile dressing applied.  Given  wound care instructions.  Follow up with PCP.  Patient given tetanus  immunization.    PROBLEM LIST:         DIAGNOSIS Left wrist and hand abscesses        ADDITIONAL INFORMATION The Emergency Medicine attending physician  was present  in the Emergency Department, who reviewed case management, and  approved  evaluation/treatment.      COPIES SENT TO::     Ellene Route L(PCP): [id number]    Electronic Signatures:  Annye English F (DO)  (Signed 23-Jul-2015 22:37)   Authored: HISTORY OF PRESENT ILLNESS, PHYSICAL EXAM, MEDICAL DECISION  MAKING,  PROBLEM LIST, DIAGNOSIS, Additional Infomation, Copies to be sent to:  Ermalinda Memos (MD)  (Signed 08-Aug-2015 20:22)   Authored: PROBLEM LIST   Co-Signer: MEDICAL DECISION MAKING, DIAGNOSIS, Additional Infomation      Last Updated: 08-Aug-2015 20:22 by Ermalinda Memos (MD)            Please see T-Sheet, initial assessment, and physician orders for  further details.    Dictating  Physician: Annye EnglishAntonio Amelio Brosky, DO  Original Electronic Signature Date: 07/23/2015 03:51 P  AO  Document #: [id number]    cc:  Amalia GreenhouseNaomi L Tyree, MD       [facility]       [address]       [place] [id number]

## 2015-08-02 ENCOUNTER — Encounter

## 2015-08-02 NOTE — Telephone Encounter (Signed)
Please confirm that patient is now on lyrica regularly and if has stopped gabapentin?

## 2015-08-02 NOTE — Telephone Encounter (Signed)
Caller Patient    LOV 06/26/15 LF 06/28/15 90 0      Requested Prescriptions     Pending Prescriptions Disp Refills   ??? pregabalin (LYRICA) 50 MG capsule 90 capsule 0     Sig: Take 1 capsule by mouth 3 times daily         Amount Requested by patient 30 days       The patient does have a future appointment scheduled with you.09/27/2015    The patients preferred pharmacy has been captured for this encounter?Yes  [id number] - [place], [place] - [address] - [phone] [address]- [phone]  [address]  [place] [id number]  Phone: [phone] Fax: [phone]

## 2015-08-03 MED ORDER — PREGABALIN 100 MG PO CAPS
100 MG | ORAL_CAPSULE | Freq: Three times a day (TID) | ORAL | 0 refills | Status: DC
Start: 2015-08-03 — End: 2015-09-28

## 2015-08-03 NOTE — Telephone Encounter (Signed)
Pt states the medication is helping him some, pt denies drug use

## 2015-08-03 NOTE — Telephone Encounter (Signed)
Called into pharm

## 2015-08-03 NOTE — Telephone Encounter (Signed)
Pt states he has been taking it regularly and is off gabapentin, however he states he's surprised it doesn't work better than it does, states hes doing all right but wishes the medication did better for him, he asks if he can increase or add medication? Please advise

## 2015-08-03 NOTE — Telephone Encounter (Signed)
Will trial increase dose.  [place] reviewed and appropriate.  Patient may pick up Rx today.

## 2015-09-20 NOTE — Progress Notes (Signed)
lmtco   Due for DM eye exam, microalbumin and lipids.

## 2015-09-24 ENCOUNTER — Encounter

## 2015-09-24 NOTE — Telephone Encounter (Signed)
Last filled 07/2015 next ov 09/28/2015

## 2015-09-25 NOTE — Telephone Encounter (Signed)
Pt has appt on 09/28/15. It appears that he had #90 TID prescribed on 08/03/15. He can discuss at visit with PCP.

## 2015-09-27 ENCOUNTER — Encounter: Attending: Internal Medicine | Primary: Internal Medicine

## 2015-09-28 ENCOUNTER — Ambulatory Visit: Admit: 2015-09-28 | Discharge: 2015-09-28 | Payer: MEDICAID | Attending: Internal Medicine | Primary: Internal Medicine

## 2015-09-28 DIAGNOSIS — E1140 Type 2 diabetes mellitus with diabetic neuropathy, unspecified: Secondary | ICD-10-CM

## 2015-09-28 LAB — POCT GLYCOSYLATED HEMOGLOBIN (HGB A1C): Hemoglobin A1C: 8.2 %

## 2015-09-28 MED ORDER — TRAZODONE HCL 100 MG PO TABS
100 MG | ORAL_TABLET | Freq: Every evening | ORAL | 3 refills | Status: DC
Start: 2015-09-28 — End: 2017-06-07

## 2015-09-28 MED ORDER — PREGABALIN 150 MG PO CAPS
150 MG | ORAL_CAPSULE | Freq: Three times a day (TID) | ORAL | 0 refills | Status: DC
Start: 2015-09-28 — End: 2015-10-16

## 2015-09-28 MED ORDER — BUPROPION HCL ER (XL) 150 MG PO TB24
150 MG | ORAL_TABLET | Freq: Every morning | ORAL | 3 refills | Status: DC
Start: 2015-09-28 — End: 2017-06-07

## 2015-09-28 MED ORDER — METFORMIN HCL 1000 MG PO TABS
1000 MG | ORAL_TABLET | Freq: Two times a day (BID) | ORAL | 5 refills | Status: DC
Start: 2015-09-28 — End: 2017-06-07

## 2015-09-28 MED ORDER — GLUCOSE BLOOD VI STRP
Freq: Every day | 3 refills | Status: DC
Start: 2015-09-28 — End: 2017-06-07

## 2015-09-28 NOTE — Progress Notes (Signed)
Subjective:  Mason Garza is a 50 y.o. male who presents with chief complaint of 3 Month Follow-Up and Discuss Medications (would like to increase trazodone, needs rfs )      HPI:  Patient presents for routine checkup and monitoring on lyrica.    Due for eye exam  Due for lipids - open orders  Due for foot exam in march  - will do today  Persistent neuropathy reports ran out of lyrica: when on it pain was 2/10; off of it 5/10.   Lab Results   Component Value Date    LABA1C 8.2 09/28/2015     No results found for: EAG  No changes in diet.  Little exercise - tired a lot.  Having trouble staying asleep due to leg pain.   Will sleep a couple hours then be up.  Stays up during the day.    PHQ Scores 06/26/2015   PHQ2 Score 5   PHQ9 Score 15     Interpretation of Total Score Depression Severity: 1-4 = Minimal depression, 5-9 = Mild depression, 10-14 = Moderate depression, 15-19 = Moderately severe depression, 20-27 = Severe depression    Depression meds in the past: cant remember what it was.  Smoking about 1 pack per day.    bgt at home running in high 100s or 200s.    Current Meds:   Current Outpatient Prescriptions on File Prior to Visit   Medication Sig Dispense Refill   ??? glucose monitoring kit (FREESTYLE) monitoring kit 1 kit by Does not apply route daily as needed (test blood sugar as needed) May have any glucometer that is covered by patient's insurance. 1 kit 0   ??? Lancets MISC Check blood sugars daily or when symptoms develop. 100 each 3   ??? Lancet Devices (B-D LANCET DEVICE) MISC Check blood sugars daily or when symptoms develop. 1 each 0     No current facility-administered medications on file prior to visit.      Current Outpatient Prescriptions   Medication Sig Dispense Refill   ??? traZODone (DESYREL) 100 MG tablet Take 1 tablet by mouth nightly 90 tablet 3   ??? pregabalin (LYRICA) 150 MG capsule Take 1 capsule by mouth 3 times daily 90 capsule 0   ??? glucose blood VI test strips (ACURA BLOOD GLUCOSE TEST)  strip 1 each by In Vitro route daily As needed. 100 each 3   ??? metFORMIN (GLUCOPHAGE) 1000 MG tablet Take 1 tablet by mouth 2 times daily (with meals) 60 tablet 5   ??? buPROPion (WELLBUTRIN XL) 150 MG extended release tablet Take 1 tablet by mouth every morning 30 tablet 3   ??? glucose monitoring kit (FREESTYLE) monitoring kit 1 kit by Does not apply route daily as needed (test blood sugar as needed) May have any glucometer that is covered by patient's insurance. 1 kit 0   ??? Lancets MISC Check blood sugars daily or when symptoms develop. 100 each 3   ??? Lancet Devices (B-D LANCET DEVICE) MISC Check blood sugars daily or when symptoms develop. 1 each 0     No current facility-administered medications for this visit.        Past Medical History   Diagnosis Date   ??? H/O echocardiogram 05/12/11     EF62%, mild LAE   ??? Hepatitis C antibody test positive 05/10/11   ??? Neuropathy ([place])    ??? Type II or unspecified type diabetes mellitus without mention of complication, not stated as uncontrolled  History reviewed. No pertinent past surgical history.  History reviewed. No pertinent family history.  Social History     Social History   ??? Marital status: Divorced     Spouse name: N/A   ??? Number of children: N/A   ??? Years of education: N/A     Social History Main Topics   ??? Smoking status: Current Every Day Smoker     Packs/day: 1.00     Years: 30.00   ??? Smokeless tobacco: Current User     Types: Chew   ??? Alcohol use 0.0 oz/week     0 Standard drinks or equivalent per week      Comment: 6 pack per yr now; former 6-12 pk per day for 10 yrs   ??? Drug use: 10.00 per week      Comment: heroin IVDU daily until 2 weeks ago (early July 2016)   ??? Sexual activity: Not Asked     Other Topics Concern   ??? None     Social History Narrative    Living with mother now - improved meal intake and assistance with DM care as mother is a former Therapist, sports       ROS:  Review of Systems   Constitutional: Positive for malaise/fatigue. Negative for chills,  diaphoresis, fever and weight loss.   Respiratory: Negative.    Cardiovascular: Negative.    Gastrointestinal: Negative.    Genitourinary: Negative.    Musculoskeletal: Negative for falls and myalgias.   Skin: Negative.    Neurological: Negative for dizziness, loss of consciousness and weakness.   Psychiatric/Behavioral: Positive for depression and substance abuse. Negative for suicidal ideas. The patient is nervous/anxious.        Objective:  Vitals:    09/28/15 1327   BP: 128/72   Site: Right Arm   Position: Sitting   Cuff Size: Medium Adult   Pulse: 66   SpO2: 95%   Weight: 191 lb (86.6 kg)   Height: 5' 10"  (1.778 m)     Physical Exam   Constitutional: He is oriented to person, place, and time and well-developed, well-nourished, and in no distress.   HENT:   Mouth/Throat: Mucous membranes are normal.   Eyes: Conjunctivae and EOM are normal.   Neck: Neck supple.   Cardiovascular: Normal rate, regular rhythm, normal heart sounds and intact distal pulses.    Pulmonary/Chest: Effort normal and breath sounds normal.   Abdominal: Soft. Bowel sounds are normal. There is no tenderness.   Neurological: He is alert and oriented to person, place, and time. He has intact cranial nerves.   Skin: Skin is warm and dry.   DM Foot Exam: no ulcerations, abnormal monofilament exam bilaterally, areas of nail thickening/discoloration, pulses intact, thick calluses with cracking bilateral feet         Assessment:  1. Type 2 diabetes mellitus with diabetic neuropathy, without long-term current use of insulin (HCC)  pregabalin (LYRICA) 150 MG capsule    glucose blood VI test strips (ACURA BLOOD GLUCOSE TEST) strip    POCT glycosylated hemoglobin (Hb A1C)    Microalbumin / Creatinine Urine Ratio    metFORMIN (GLUCOPHAGE) 1000 MG tablet    Microalbumin / Creatinine Urine Ratio   2. Depression, unspecified depression type  traZODone (DESYREL) 100 MG tablet    buPROPion (WELLBUTRIN XL) 150 MG extended release tablet   3. Insomnia,  unspecified type  traZODone (DESYREL) 100 MG tablet   4. Foot callus     5. Tobacco use  buPROPion (WELLBUTRIN XL) 150 MG extended release tablet       Plan:   1. Chronic problem, A1c above goal.  Increase metformin and recheck in 3 months.  Neuropathy improving on lyrica but persistent pain.  Increase lyrica dose and monitor response.  OARRS reviewed and appropriate.  Informed patient that it is important not to run out or skip doses as his pain will return/increase.  Encouraged to schedule DM eye exam.  Urine testing ordered.  See orders.  2. Chronic problem, uncontrolled.  Start wellbutrin for added smoking cessation benefit.  Continue trazodone for sleep augmentation.  Follow up in 3 months or sooner if issues.On the basis of positive PHQ-9 screening (PHQ-9 Total Score: 16), the following plan was implemented: see above.  Patient will follow-up in 3 month(s) with PCP.  3. Chronic problem, continue trazodone.  4. Chronic problem, encouraged to monitor foot exam, avoid going barefoot, avoid ill-fitting shoes, and follow up with podiatry.   5. Chronic problem, trial wellbutrin.     Will have patient get fasting labs 1 week prior to next appointment.    Medication indications, directions, and side effects were discussed.     Return in about 3 months (around 12/26/2015) for checkup, review labs and medications.    Dictated using Dragon Naturally Speaking Medical Version 2.4  Proof read however unrecognized voice recognition errors may have occurred.    Electronically signed by Omelia Blackwater, MD on 09/28/15 at 1:54 PM

## 2015-09-29 LAB — MICROALBUMIN / CREATININE URINE RATIO
Creatinine, Random Urine: 112 mg/dL
Microalb, Ur: <5 mg/L
Microalbumin Creatinine Ratio: <4.5 mg/g (ref 0.0–29.9)

## 2015-10-16 ENCOUNTER — Telehealth

## 2015-10-16 MED ORDER — PREGABALIN 150 MG PO CAPS
150 MG | ORAL_CAPSULE | Freq: Three times a day (TID) | ORAL | 0 refills | Status: DC
Start: 2015-10-16 — End: 2017-05-29

## 2015-10-16 NOTE — Telephone Encounter (Signed)
OARRS reviewed.  Lyrica written on 2/10, filled 30 day supply on 2/15.  Due for refill 3/13.  Rx prepared and placed at front desk.  Ok to pick up the Friday prior.

## 2015-10-25 NOTE — Telephone Encounter (Signed)
PT called stating he wants to start on Vivitrol (Naltrexone). Pt states he is being seen at an outpatient clinic and they want to prescribe. Pt wants to know if it would be ok to start this RX with the medication he is currently taking. Please advise.

## 2015-10-26 NOTE — Telephone Encounter (Signed)
It should not interact with any of his current medications.  I have no problem with him taking it if they want to prescribe it for him.  Please call and let him know.  Thanks

## 2015-10-29 NOTE — Telephone Encounter (Signed)
Left detailed message on VM

## 2015-11-16 ENCOUNTER — Inpatient Hospital Stay
Admit: 2015-11-16 | Discharge: 2015-11-19 | Discharge status: Against Medical Advice | Attending: Internal Medicine | Admitting: Internal Medicine

## 2015-11-16 LAB — COMPREHENSIVE METABOLIC PANEL
ALT: 33 U/L (ref 12–78)
AST: 26 U/L (ref 15–37)
Albumin,Serum: 3.7 g/dL (ref 3.4–5.0)
Alkaline Phosphatase: 139 U/L — ABNORMAL HIGH (ref 45–117)
Anion Gap: 5 NA
BUN: 11 mg/dL (ref 7–25)
CO2: 32 mmol/L (ref 21–32)
Calcium: 9.3 mg/dL (ref 8.2–10.1)
Chloride: 103 mmol/L (ref 98–109)
Creatinine: 0.92 mg/dL (ref 0.55–1.40)
EGFR IF NonAfrican American: >60 mL/min (ref >60)
Glucose: 208 mg/dL — ABNORMAL HIGH (ref 70–100)
Potassium: 4 mmol/L (ref 3.5–5.1)
Sodium: 140 mmol/L (ref 135–145)
Total Bilirubin: 0.4 mg/dL (ref 0.2–1.0)
Total Protein: 8.4 g/dL — ABNORMAL HIGH (ref 6.4–8.2)
eGFR African American: >60 mL/min (ref >60)

## 2015-11-16 LAB — CBC WITH AUTO DIFFERENTIAL
Absolute Baso #: 0 10*3/uL (ref 0.0–0.2)
Absolute Eos #: 0 10*3/uL (ref 0.0–0.5)
Absolute Lymph #: 1.7 10*3/uL (ref 1.0–4.3)
Absolute Mono #: 0.5 10*3/uL (ref 0.0–0.8)
Absolute Neut #: 2.5 10*3/uL (ref 1.8–7.0)
Basophils: 0.9 %
Eosinophils: 0 %
Granulocytes %: 52.7 %
Hematocrit: 40.5 % (ref 40.0–52.0)
Hemoglobin: 13.6 g/dL (ref 13.0–18.0)
Lymphocyte %: 36.1 %
MCH: 28 pg (ref 26.0–34.0)
MCHC: 33.6 % (ref 32.0–36.0)
MCV: 83.2 fL (ref 80.0–98.0)
MPV: 7.1 fL — ABNORMAL LOW (ref 7.4–10.4)
Monocytes: 10.3 %
Platelets: 219 10*3/uL (ref 140–440)
RBC: 4.87 10*6/uL (ref 4.40–5.90)
RDW: 14 % (ref 11.5–14.5)
WBC: 4.8 10*3/uL (ref 3.6–10.7)

## 2015-11-16 LAB — LACTIC ACID: Lactic Acid: 1.9 mmol/L (ref 0.4–2.0)

## 2015-11-16 NOTE — ED Provider Notes (Signed)
Mason Garza, Mason Garza       DOS:           11/16/2015  MR #:             [id number]             ACCOUNT #:     [id number]  DATE OF BIRTH:    02-Apr-1966              AGE:           50      HISTORY OF PRESENT ILLNESS:    PERTINENT HISTORY OF PRESENT  ILLNESS. Patient is a 50 year old male  with  history of diabetes adult onset type II complains of left arm redness  swelling  and pain.  States he is concerned that there is an area that draining  pus.  He  admits that he is been injecting heroin.  He states this became  swollen in the  last 2-3 days.  Because of continued redness and swelling he presents  to the  Emergency Room for evaluation.  States the arm is throbbing, feels  swollen.  Denies numbness or tingling.  Denies fevers or chills.  Denies chest  pain or  shortness of breath.  States has chronic pain due to nerve damage in  his feet  and legs.  He is on oral medications for diabetes.  He does not know  how his  blood sugar has been running.  No exacerbating or alleviating factors to his symptoms.    Review of systems: Pertinent positives as above per  history of  present  illness.  Other systems reviewed and found to be negative to a total  of 10  systems reviewed    PERTINENT PAST/ FAMILY/SOCIAL HISTORY Past history, family history,  social  history are reviewed in the nurse's notes and I agree with  documentation.  Patient has history of adult onset type 2 diabetes, depression,  neuropathy.  Family history: No known contributions.  Social history: Admits to heroin abuse.  He is a smoker.        PHYSICAL EXAM Vital signs reviewed in nurse's notes.  Patient is nontoxic in appearance.  No respiratory distress.  Head: Normocephalic, atraumatic  Eyes: Pupils are equal, round and reactive to light.  EOMI.  Conjunctiva clear.   Sclera anicteric  ENT: Mucous membranes moist.  Throat shows no erythema exudates or  edema.  Neck: No anterior adenopathy.  No tenderness or stiffness.  Lungs: Clear to  auscultation bilaterally.  No wheezing rales or  rhonchi.  Heart: Regular rate and rhythm.  No audible murmur or gallop.  Abdomen: Soft, nondistended, nontender.  No rebound or guarding.  No  signs of  peritonitis.  Back: No midline tenderness.  No flank area tenderness.  Extremities: No gross deformity.  The left upper extremity appears  edematous  especially at the forearm.  He has an appearance of a pinpoint area of  puncture  wound to his left and acute fossa that is draining pus.  There is no  obvious  fluctuance or abscess that requires drainage.  Patient has cellulitis  that is  firm red warm and tender extending down the entire volar forearm to  just above  the wrist.  Other extremities nontender.  Equal distal pulses in all  4  extremities.  No obvious joint swelling.  No calf tenderness.  Negative Homans  sign.  Good distal pulses in all 4 extremities.  Neurologic: Alert and fully oriented.  No focal motor, sensory  deficits in all  4 extremities.  Cranial nerves II through XII grossly intact.    MEDICAL DECISION MAKING:  Pertinent Results.:  GENERAL:    16-Nov-2015 15:18, Comprehensive Metabolic Panel    Sodium Level 140 [135 - 145 mmol/L]    Potassium Level 4.0 [3.5 - 5.1 mmol/L]    Chloride Level 103 [98 - 109 mmol/L]    Carbon Dioxide Level 32 [21 - 32 mmol/L]    Anion Gap 5    Glucose Level Image has been removed.  208 [70 - 100 mg/dL]    Urea Nitrogen 11 [7 - 25 mg/dL]    Creatinine Level.Marland Kitchen 0.92 [0.55 - 1.40 mg/dL]    eGFR African American >60.0 [>60 mL/min]    eGFR Other >60.0 [>60 mL/min]  Source- MDRD equation with creatinine calibration to IDMS(NKDEP)   eGFR not recommended for drug dose adjustment    Calcium Level 9.3 [8.2 - 10.1 mg/dL]    Total Protein Image has been removed.  8.4 [6.4 - 8.2 g/dL]    Albumin, Serum 3.7 [3.4 - 5.0 g/dL]    Bilirubin,Total 0.4 [0.2 - 1.0 mg/dL]    Alkaline Phosphatase. Image has been removed.  139 [45 - 117 U/L]    ALT (SGPT) 33 [12 - 78 U/L]    AST (SGOT) 26 [15  - 37 U/L]    16-Nov-2015 15:18, Lactic Acid    Lactic Acid 1.9 [0.4 - 2.0 mmol/L]  HEMO:    16-Nov-2015 15:18, Hemogram with Autodiff    WBC 4.8 [3.6 - 10.7 10*3/uL]    RBC 4.87 [4.40 - 5.90 10*6/uL]    Hemoglobin 13.6 [13.0 - 18.0 g/dL]    Hematocrit 40.5 [40.0 - 52.0 %]    MCV 83.2 [80.0 - 98.0 fL]    MCH 28.0 [26.0 - 34.0 pg]    MCHC 33.6 [32.0 - 36.0 %]    MPV Image has been removed.  7.1 [7.4 - 10.4 fL]    RDW 14.0 [11.5 - 14.5 %]    Platelet 219 [140 - 440 10*3/uL]    Granulocytes 52.7 [ %]    Lymphocyte 36.1 [ %]    Monocyte 10.3 [ %]    Eosinophil 0.0 [ %]    Basophil 0.9 [ %]    Absolute Monocyte Count. 0.5 [0.0 - 0.8 10*3/uL]    Absolute Lymphocyte Count. 1.7 [1.0 - 4.3 10*3/uL]    Absolute Neutrophil Count. 2.5 [1.8 - 7.0 10*3/uL]      SIGNIFICANT FINDINGS/ED COURSE/MEDICAL DECISION MAKING/TREATMENT  PLAN  Laboratory studies are obtained.  White blood cell count is normal.  Lactic  acid is 1.9.  Glucose is mildly elevated but there is no signs of  acidosis.  Laboratory studies reviewed by this examiner.   Ultrasound of the left upper extremity is obtained.  There is no deep  vein  thrombosis.  There is no area of open infection leaking into the left  antecubital fossa appears that a heterogeneous area with  hypervascularity.  Read by radiology.    Medical decision-making: Patient has an area of cellulitis that  appears to be  coming from antecubital fossa abscess secondary to IV drug abuse.  He  has  diabetes.  I do feel he needs to be admitted for IV antibiotics.  Here  in the  Emergency Room, patient is given IV vancomycin.  Patient agrees with  plans for  admission.  I did discuss this case with Dr. Natale Milch who agrees with  plans for  admission.  Patient is admitted in stable condition.    PROBLEM LIST:       Admit Reason:     L arm infection: Entered Date: 16-Nov-2015 14:30, Entered By:  Denton Ar, Status: Active        DIAGNOSIS Clinical impression:  #1.  Acute left upper extremity  cellulitis, initial evaluation  #2.  Acute left antecubital fossa abscess, initial evaluation  #3.  Acute hyperglycemia with history of diabetes type 2, without  acidosis,  initial evaluation  #4. History of IV drug abuse      COPIES SENT TO::     Omelia Blackwater L(PCP): [id number]  Electronic Signatures:  Junius Roads (MD)  (Signed 16-Nov-2015 17:23)   Authored: HISTORY OF PRESENT ILLNESS, PHYSICAL EXAM, MEDICAL DECISION  MAKING,  PROBLEM LIST, DIAGNOSIS, Copies to be sent to:      Last Updated: 16-Nov-2015 17:23 by Alona Bene D (MD)            Please see T-Sheet, initial assessment, and physician orders for  further details.    Dictating Physician: Junius Roads, MD  Original Electronic Signature Date: 11/16/2015 05:23 P  KDM  Document #: [id number]    cc:  Coralyn Mark, MD       [place]       [place] [address]

## 2015-11-17 LAB — URINE DRUG SCREEN
Amphetamines, urine: NEGATIVE NA
Barbiturates, Urine: NEGATIVE NA
Benzodiazepine Ur Qual: NEGATIVE NA
Cocaine Metabolites, Ur: NEGATIVE NA
Methadone, Urine: NEGATIVE NA
Opiates, Urine: NEGATIVE NA
Oxycodone Screen, Ur: NEGATIVE NA
PCP, Urine: NEGATIVE NA

## 2015-11-17 LAB — CBC
Hematocrit: 38.6 % — ABNORMAL LOW (ref 40.0–52.0)
Hemoglobin: 12.6 g/dL — ABNORMAL LOW (ref 13.0–18.0)
MCH: 27.4 pg (ref 26.0–34.0)
MCHC: 32.6 % (ref 32.0–36.0)
MCV: 84 fL (ref 80.0–98.0)
MPV: 7 fL — ABNORMAL LOW (ref 7.4–10.4)
Platelets: 208 10*3/uL (ref 140–440)
RBC: 4.59 10*6/uL (ref 4.40–5.90)
RDW: 13.9 % (ref 11.5–14.5)
WBC: 5.2 10*3/uL (ref 3.6–10.7)

## 2015-11-17 LAB — BASIC METABOLIC PANEL
Anion Gap: 7 NA
BUN: 11 mg/dL (ref 7–25)
CO2: 29 mmol/L (ref 21–32)
Calcium: 8.6 mg/dL (ref 8.2–10.1)
Chloride: 104 mmol/L (ref 98–109)
Creatinine: 0.72 mg/dL (ref 0.55–1.40)
EGFR IF NonAfrican American: >60 mL/min (ref >60)
Glucose: 113 mg/dL — ABNORMAL HIGH (ref 70–100)
Potassium: 4 mmol/L (ref 3.5–5.1)
Sodium: 140 mmol/L (ref 135–145)
eGFR African American: >60 mL/min (ref >60)

## 2015-11-17 LAB — LIPID PANEL
Chol/HDL Ratio: 4 NA
Cholesterol: 95 mg/dL (ref <200)
HDL: 26 mg/dL — ABNORMAL LOW (ref 40–59)
LDL Cholesterol: -5 mg/dL — CR (ref <100)
Triglycerides: 370 mg/dL — AB (ref <150)

## 2015-11-17 LAB — POCT GLUCOSE
POC Glucose: 117 mg/dL — ABNORMAL HIGH (ref 70–100)
POC Glucose: 171 mg/dL — ABNORMAL HIGH (ref 70–100)
POC Glucose: 139 mg/dL — ABNORMAL HIGH (ref 70–100)

## 2015-11-17 LAB — PROTIME/INR & PTT
INR: 1 NA (ref 0.9–1.1)
Protime: 10.9 s (ref 9.0–12.0)
aPTT: 27.3 s (ref 20.0–30.5)

## 2015-11-17 LAB — C-REACTIVE PROTEIN: CRP: 10.8 mg/L — ABNORMAL HIGH (ref 0.0–2.9)

## 2015-11-17 LAB — HEPATITIS C ANTIBODY: Hepatitis C Ab: DETECTED NA — AB

## 2015-11-17 LAB — SEDIMENTATION RATE: Sed Rate: 18 mm/h — ABNORMAL HIGH (ref 0–10)

## 2015-11-17 LAB — HEPATITIS B SURFACE ANTIBODY QUANT: Hep B S Ab: <8 m[IU]/mL

## 2015-11-17 LAB — HIV SCREEN: HIV 1+2 AB+HIV1P24 AG, EIA: NONREACTIVE NA

## 2015-11-18 LAB — BASIC METABOLIC PANEL
Anion Gap: 6 NA
BUN: 16 mg/dL (ref 7–25)
CO2: 28 mmol/L (ref 21–32)
Calcium: 8.2 mg/dL (ref 8.2–10.1)
Chloride: 108 mmol/L (ref 98–109)
Creatinine: 0.79 mg/dL (ref 0.55–1.40)
EGFR IF NonAfrican American: >60 mL/min (ref >60)
Glucose: 147 mg/dL — ABNORMAL HIGH (ref 70–100)
Potassium: 3.9 mmol/L (ref 3.5–5.1)
Sodium: 142 mmol/L (ref 135–145)
eGFR African American: >60 mL/min (ref >60)

## 2015-11-18 LAB — CBC
Hematocrit: 36.4 % — ABNORMAL LOW (ref 40.0–52.0)
Hemoglobin: 11.9 g/dL — ABNORMAL LOW (ref 13.0–18.0)
MCH: 27.7 pg (ref 26.0–34.0)
MCHC: 32.6 % (ref 32.0–36.0)
MCV: 84.9 fL (ref 80.0–98.0)
MPV: 7.3 fL — ABNORMAL LOW (ref 7.4–10.4)
Platelets: 178 10*3/uL (ref 140–440)
RBC: 4.28 10*6/uL — ABNORMAL LOW (ref 4.40–5.90)
RDW: 13.8 % (ref 11.5–14.5)
WBC: 4 10*3/uL (ref 3.6–10.7)

## 2015-11-18 LAB — POCT GLUCOSE
POC Glucose: 167 mg/dL — ABNORMAL HIGH (ref 70–100)
POC Glucose: 151 mg/dL — ABNORMAL HIGH (ref 70–100)
POC Glucose: 344 mg/dL — ABNORMAL HIGH (ref 70–100)

## 2015-11-19 LAB — COMPREHENSIVE METABOLIC PANEL
ALT: 30 U/L (ref 12–78)
AST: 24 U/L (ref 15–37)
Albumin,Serum: 2.9 g/dL — ABNORMAL LOW (ref 3.4–5.0)
Alkaline Phosphatase: 121 U/L — ABNORMAL HIGH (ref 45–117)
Anion Gap: 7 NA
BUN: 11 mg/dL (ref 7–25)
CO2: 23 mmol/L (ref 21–32)
Calcium: 8.2 mg/dL (ref 8.2–10.1)
Chloride: 109 mmol/L (ref 98–109)
Creatinine: 0.71 mg/dL (ref 0.55–1.40)
EGFR IF NonAfrican American: >60 mL/min (ref >60)
Glucose: 200 mg/dL — ABNORMAL HIGH (ref 70–100)
Potassium: 4.6 mmol/L (ref 3.5–5.1)
Sodium: 139 mmol/L (ref 135–145)
Total Bilirubin: 0.3 mg/dL (ref 0.2–1.0)
Total Protein: 7.1 g/dL (ref 6.4–8.2)
eGFR African American: >60 mL/min (ref >60)

## 2015-11-19 LAB — CBC WITH AUTO DIFFERENTIAL
Absolute Baso #: 0 10*3/uL (ref 0.0–0.2)
Absolute Eos #: 0 10*3/uL (ref 0.0–0.5)
Absolute Lymph #: 0.8 10*3/uL — ABNORMAL LOW (ref 1.0–4.3)
Absolute Mono #: 0.3 10*3/uL (ref 0.0–0.8)
Absolute Neut #: 2 10*3/uL (ref 1.8–7.0)
Basophils: 0.4 %
Eosinophils: 0 %
Granulocytes %: 63.9 %
Hematocrit: 36.5 % — ABNORMAL LOW (ref 40.0–52.0)
Hemoglobin: 12.2 g/dL — ABNORMAL LOW (ref 13.0–18.0)
Lymphocyte %: 25.4 %
MCH: 28 pg (ref 26.0–34.0)
MCHC: 33.4 % (ref 32.0–36.0)
MCV: 84 fL (ref 80.0–98.0)
MPV: 7.3 fL — ABNORMAL LOW (ref 7.4–10.4)
Monocytes: 10.3 %
Platelets: 152 10*3/uL (ref 140–440)
RBC: 4.34 10*6/uL — ABNORMAL LOW (ref 4.40–5.90)
RDW: 13.9 % (ref 11.5–14.5)
WBC: 3.1 10*3/uL — ABNORMAL LOW (ref 3.6–10.7)

## 2015-11-19 LAB — CALCIUM, IONIZED
Ionized Ca: 3.9 mg/dL — ABNORMAL LOW (ref 4.30–5.20)
pH, Bld: 7.43 NA (ref 7.31–7.46)

## 2015-11-19 LAB — POCT GLUCOSE
POC Glucose: 278 mg/dL — ABNORMAL HIGH (ref 70–100)
POC Glucose: 172 mg/dL — ABNORMAL HIGH (ref 70–100)

## 2015-11-19 NOTE — Discharge Summary (Signed)
PATIENT:             Mason Garza, Mason Garza                  ADMIT DATE:            11/16/2015  MEDICAL RECORD NUMBER:     [id number]                        DISCHARGE DATE:        11/19/2015  ACCOUNT #:           [id number]                       DATE OF BIRTH:         10-22-1965  PCP:                                  Electronically Authenticated                              Mason PilgrimVivek Leinani Lisbon, MD 11/20/2015 03:53 P    ATTENDING PHYSICIAN:  Mason RoyalsJianfang Feng, MD    DICTATING PHYSICIAN:  Mason PilgrimVivek Jamontae Thwaites, MD    PRIMARY CARE PHYSICIAN:  Mason Garza, M.D.    HISTORY OF PRESENT ILLNESS:  50 year old male history of hepatitis C,  IV drug abuse, diabetes type 2 with neuropathy, and depression, who  presents with left arm pain, swelling, and erythema.  [name]  symptoms started 3-4 days prior to admission and progressively  worsened.  He stated that his arm was draining pus.  He admits to IV  drug injection.  No fevers or chills.  He stated his last heroin use  was March 31st in the morning.    SIGNIFICANT FINDINGS:  Admission Vitals; temperature 97.7, pulse 71,  respirations 18, oxygen saturation 98% on room air, blood pressure  116/70.  White blood cell count 4.8.  X-ray of the elbow showed no  acute abnormality.    IMAGING:  Ultrasound of the arm showed cellulitis and possibly  myositis in the left antecubital area with no definable abscess and no  evidence of septic phlebitis.    CONSULTS:  Dr. Andres ShadPozsgay of General Surgery  Addiction Medicine, who were unable to see patient prior to him  leaving AMA.    PROCEDURES:  Incision and drainage on April 1st by General Surgery.    HOSPITAL COURSE:  1. LEFT ARM CELLULITIS AND ABSCESS.  He did have an incision and        drainage on April 1st.  Cultures have been no growth to date,        however final cultures are pending.  He initially was treated        with vancomycin.  This was transitioned to ceftriaxone and        Bactrim.  He did receive prescriptions for Bactrim and Keflex to         complete a 7-day total antibiotic course.   Packing was removed        and the patient will dress his wound once daily.   This was        discussed with General Surgery.  He does have wound care        supplies and has demonstrated ability to perform his own wound  care independently.  2. IV DRUG ABUSE.  We did discuss the potentially life-threatening        affects of IV drugs.  The patient was motivated to quit.        Addiction Medicine consult was not completed prior to him        leaving.  3. OPIATE WITHDRAWAL.  The patient was mildly jittery prior to        discharge, however, he was not tachycardic or diaphoretic.  His        gait was not affected.  A tramadol taper was started on April        2nd and continued until he left AMA on April 3rd.  Patient was        aware that leaving AMA while in a mild opiate withdrawal could        be very hazardous to his health and potentially life        threatening.  He was educated on symptoms which would require        immediate medical attention.  4. DIABETES TYPE 2.  This is controlled.  5. HEPATITIS C.  RNA is pending.  6. TOBACCO ABUSE.  Cessation discussed.  7. HYPERTRIGLYCERIDEMIA.  Consider outpatient treatment.  8. Abnormal labs.   ionized calcium was low, however, normal calcium        level on basic metabolic panel, mildly elevated alkaline        phosphatase.  Mildly low WBC count, and asymptomatic anemia.        The patient was hemodynamically stable.   Consider following        labs as outpatient.  9. The patient left against medical advice as he was told his by the        staff at [facility] that his mother is dying of cancer, and        does not have long to live.  The patient denied suicidal or        homicidal ideation.  The patient understood the risks of leaving        against medical advice including life-threatening infection and        death.  He does understand symptoms which should prompt        immediate medical  attention.    DISPOSITION:  Against medical advice    FOLLOWUP:  Follow up appointments with Dr. Olivia Mackie on Friday, April 7th  @ 9:50 am.  The patient was informed of the date and time of the  appointment.    DISCHARGE MEDICATIONS:  No changes were made, except for the  following: He was given a prescription for Keflex 500 mg every 6 hours  for 4 more days and Bactrim 160/800 twice a day for 4 more days.    Disk[employer] Job ID: [id number]          Mason Pilgrim, MD    DOD:11/19/2015 01:40 P  VM/dsk  DOT:11/19/2015 08:56 P  Job Number: [id number]  Document Number: [id number]  cc:   Mason Royals, MD        Inpatient Medical Services        [address]        [place] [address]          Mason Pilgrim, MD        [place] [address]          Amalia Greenhouse, MD  [place]        [place] [address]

## 2015-11-19 NOTE — Telephone Encounter (Signed)
Called pt lvmtcb

## 2015-11-19 NOTE — Telephone Encounter (Signed)
Transition of Care Note from [facility]    Admit Date: 11-16-15    Discharge Date:11-19-15- LEFT AMA    Visit Status:    []  Observation   [x]  Inpatient Admission     Diagnosis   1. Cellulitis  2. Abscess of left upper extremity    Medication List   Unable to have complete med rec- Left AMA      Complexity of Follow up:   []  Moderate Complexity: follow up within 7-14 calendar days (16109(99495)   [x]  Severe Complexity: follow up within 7 calendar days (60454(99496)    Follow up Testing, Pending results or Referrals at Transitional Care Visit:    []   yes   [x]   No  *patient was told by Dr. Christie BeckersMathur to at least change dressing to left arm daily since he couldn't stay for proper instructions.   *script written and given to patient for Keflex and Bactrim for 4 more days.       Instructions to MA: Please call patient on day after discharge (must document patient  contacted within 2 business days of discharge).    Follow up questions for MA:  1. Did you get medications filled and taking them as instructed from discharge?  2. Are you following your discharge instructions from your hospital stay?  3. Please confirm patient is scheduled for a follow up appointment within the above time frame.    Follow up appointment date/time/provider:   *11-23-15 at 9:50am with Dr. [name].  Patient left AMA, stated that mother was dying. He is aware about follow up date and time. Informed him that Dr. Debroah Loopyree's office would be contacting him.

## 2015-11-19 NOTE — Telephone Encounter (Signed)
Please contact patient tomorrow to ask how he is doing, if he got the antibiotics filled.  I am concerned as he told the hospital staff that his mother is dying.

## 2015-11-19 NOTE — Other (Unsigned)
Patient Acct Nbr: [id number]   Primary AUTH/CERT: [id number]  Primary Insurance Company Name: [employer]  Primary Insurance Plan name: [facility]  Primary Insurance Group Number:   Primary Insurance Plan Type: Health  Primary Insurance Policy Number: [id number]

## 2015-11-20 NOTE — Telephone Encounter (Signed)
lmtco

## 2015-11-21 LAB — CULTURE, AEROBIC

## 2015-11-21 NOTE — Telephone Encounter (Signed)
Left arm abscess culture reviewed.  Strep intermedius (anginosus group) is sensitive to ampicillin, clindamycin, ceftriaxone, and vancomycin.  Discussed with Microbiology, strep is also sensitive to levofloxacin, and resistant to tetracycline.  No other information available.    I called [name]: he says he is doing well.  He says his arm feels improved and has no fevers/chills/pain/redness.  He also has no symptoms of opiate withdrawal.  He did fill the prescriptions for Bactrim and Keflex.  I instructed him to stop taking these and pick up a new prescription (he requested this to be sent to [facility] in [place] [address]   Patient agrees to pick up the new script today.  He had no further questions and is aware of his follow up appointment with Dr. [name] on Friday 4/7.  I called [facility] pharmacy and called-in Ampicillin 500mg  q6h x 4 days.    (Patient was on Vancomycin from 3/31/-4/2 and Ceftriaxone on 4/2 in the hospital).

## 2015-11-22 LAB — HEP C RNA, QUANT (VIRAL LOAD)
Hepatitis C RNA Quant Log: 5.91 {Log_IU} — AB (ref <1.18)
Hepatitis C RNA Quantitative: 809609 [IU]/mL — AB (ref <15)

## 2015-11-22 LAB — CULTURE, ANAEROBIC: Anaerobic Culture: NO GROWTH

## 2015-11-22 LAB — CULTURE BLOOD #1
Blood Culture, Routine: NO GROWTH
Blood Culture, Routine: NO GROWTH

## 2015-11-23 ENCOUNTER — Encounter: Attending: Internal Medicine | Primary: Internal Medicine

## 2015-11-23 NOTE — Telephone Encounter (Signed)
Pt has appt today

## 2015-11-23 NOTE — Telephone Encounter (Signed)
Please contact patient regarding his NO show appointment and make sure he is taking the ampicillin prescription as listed below.  Please get him rescheduled for follow up and make sure he is doing ok.  Unknown status regarding his mothers health.

## 2015-11-26 NOTE — Telephone Encounter (Signed)
Pt aware, rescheduled f/u. Pt mother dx with cancer and not well

## 2015-11-27 ENCOUNTER — Encounter: Attending: Internal Medicine | Primary: Internal Medicine

## 2015-11-28 DIAGNOSIS — T401X1A Poisoning by heroin, accidental (unintentional), initial encounter: Secondary | ICD-10-CM

## 2015-11-28 NOTE — ED Notes (Signed)
Patient is awake more than at initial presentation since narcan was administered IM. Patient resting quietly and comfortably with no visible signs of distress present. VS stable. Will continue to evaluate.       Roswell MinersJoseph Vianca Bracher, RN  11/28/15 [phone]

## 2015-11-28 NOTE — ED Notes (Signed)
RN attempted to straight cath patient with no success. Patient was uncooperative during cath. Patient advised to provide specimen in urinal or RN will have to attempt cath again until specimen is obtained. Patient agreed.      Roswell MinersJoseph Svetlana Bagby, RN  11/28/15 2120

## 2015-11-28 NOTE — ED Provider Notes (Addendum)
HPI:  11/28/15, Time: 9:06 PM         Mason Garza is a 50 y.o. male presenting to the ED for evaluation of altered mental status, beginning prior to arrival.   The patient apparently was pulled over by [facility] police while driving his vehicle.  The patient allegedly reported injecting himself with heroin and drinking alcohol prior to being pulled over by the police.  The patient told the police officer that he felt weird after being pulled over so he was brought in for medical evaluation.  The patient denies any other illicit drug use.  He states that "he does not remember" to many questions therefore the history is limited.  He denied any headache.  He denies any change in vision or focal neurologic weakness process.  Denies any cough or adenopathy.  Denies any neck or back pain.  Denies any chest pain or shortness breath.  Denies abdominal pain or vomiting or diarrhea.  Denies any dysuria, hematuria, urgency or frequency.  Denies any new rashes or lesions.  Denies any muscle or joint pains.  Denies any fall or injury.      ROS:   Pertinent positives and negatives are stated within HPI, all other systems reviewed and are negative.  --------------------------------------------- PAST HISTORY ---------------------------------------------  Past Medical History:  has a past medical history of Chronic hepatitis C ([place]); H/O echocardiogram; Neuropathy ([place]); Not immune to hepatitis B virus; and Type II or unspecified type diabetes mellitus without mention of complication, not stated as uncontrolled.    Past Surgical History:  has no past surgical history on file.    Social History:  reports that he has been smoking.  He has a 30.00 pack-year smoking history. His smokeless tobacco use includes Chew. He reports that he drinks alcohol. He reports that he uses illicit drugs, including Opiates , about 10 times per week.    Family History: family history is not on file.     [name] home medications have been  reviewed.    Allergies: Review of patient's allergies indicates no known allergies.    ---------------------------------------------------PHYSICAL EXAM--------------------------------------    Constitutional/General: Drowsy but arousable to verbal stimuli and oriented x3,   Head: Normocephalic and atraumatic  Eyes: PERRL, EOMI  Mouth: Oropharynx clear, handling secretions, no trismus  Neck: Supple, full ROM, non tender to palpation in the midline, no stridor, no crepitus, no meningeal signs  Pulmonary: Lungs clear to auscultation bilaterally, no wheezes, rales, or rhonchi. Not in respiratory distress  Cardiovascular:  Regular rate. Regular rhythm. No murmurs, gallops, or rubs. 2+ distal pulses  Chest: no chest wall tenderness  Abdomen: Soft.  Non tender. Non distended.  +BS.  No rebound, guarding, or rigidity. No pulsatile masses appreciated.  Musculoskeletal: Moves all extremities x 4. Warm and well perfused, no clubbing, cyanosis, or edema. Capillary refill <3 seconds  Skin: warm and dry. No rashes.  Area of erythema to the left forearm and healing scab in the left antecubital region.   Neurologic: GCS 15, CN 2-12 grossly intact, no focal deficits, symmetric strength 5/5 in the upper and lower extremities bilaterally  Psych: Flat Affect    -------------------------------------------------- RESULTS -------------------------------------------------  I have personally reviewed all laboratory and imaging results for this patient. Results are listed below.     LABS:  Results for orders placed or performed during the hospital encounter of 11/28/15   CBC Auto Differential   Result Value Ref Range    WBC 9.8 3.6 - 10.7 10*3/uL  RBC 4.77 4.40 - 5.90 10*6/uL    Hemoglobin 13.4 13.0 - 18.0 g/dL    Hematocrit 40.6 40.0 - 52.0 %    MCV 85.2 80.0 - 98.0 fL    MCH 28.2 26.0 - 34.0 pg    MCHC 33.1 32.0 - 36.0 %    RDW 14.2 11.5 - 14.5 %    Platelets 211 140 - 440 10*3/uL    MPV 7.7 7.4 - 10.4 fL    Granulocytes % 76.7 %     Lymphocyte % 12.3 %    Monocytes 10.2 %    Eosinophils 0.0 %    Basophils 0.8 %    Absolute Neut # 7.5 (H) 1.8 - 7.0 10*3/uL    Absolute Lymph # 1.2 1.0 - 4.3 10*3/uL    Absolute Mono # 1.0 (H) 0.0 - 0.8 10*3/uL    Absolute Eos # 0.0 0.0 - 0.5 10*3/uL    Absolute Baso # 0.1 0.0 - 0.2 10*3/uL   Comprehensive Metabolic Panel   Result Value Ref Range    Sodium 144 135 - 145 mmol/L    Potassium 4.6 3.5 - 5.1 mmol/L    Chloride 107 98 - 109 mmol/L    CO2 28 21 - 32 mmol/L    Anion Gap 9 NA    Glucose 226 (H) 70 - 100 mg/dL    BUN 14 7 - 25 mg/dL    CREATININE 0.87 0.55 - 1.40 mg/dL    eGFR African American >60.0 >60 mL/min    EGFR IF NonAfrican American >60.0 >60 mL/min    Calcium 9.4 8.2 - 10.1 mg/dL    Albumin,Serum 3.5 3.4 - 5.0 g/dL    Total Protein 7.9 6.4 - 8.2 g/dL    Total Bilirubin 0.6 0.2 - 1.0 mg/dL    Alkaline Phosphatase 124 (H) 45 - 117 U/L    ALT 71 12 - 78 U/L    AST 166 (H) 15 - 37 U/L   Troponin   Result Value Ref Range    Troponin I <0.015 0.000 - 0.045 ng/mL   Urinalysis   Result Value Ref Range    Appearance CLOUDY Clear NA    Color, UA DK YELLOW Lt. Yellow NA    Specific Gravity, Urine 1.029 1.005 - 1.030 NA    pH, Urine 5.5 5.0 - 8.0 NA    LEUKOCYTES, UA TRACE Negative NA    Nitrite, Urine NEG Negative NA    Total Protein, Urine NEG Negative mg/dL    Glucose, Ur 4+ Negative mg/dL    Ketones, Urine TRACE Negative mg/dL    Urobilinogen, Urine 1.0 0 - 1 mg/dL    Bilirubin, Urine POS Negative NA    Occult Blood,Urine 1+ Negative [RBC]/uL   Urine Drug Screen   Result Value Ref Range    Amphetamines, urine NEGATIVE NA    Barbiturates, Ur NEGATIVE NA    Benzodiazepine Ur Qual NEGATIVE NA    Cocaine Metabolites, Ur NEGATIVE NA    Methadone, Urine NEGATIVE NA    Opiates, Urine NEGATIVE NA    Oxycodone Screen, Ur NEGATIVE NA    PCP, Urine NEGATIVE NA   ETHANOL   Result Value Ref Range    Ethanol Lvl <0.003 0.000 - 0.003 g/dL   Urinalysis with Microscopic   Result Value Ref Range    WBC, UA 6-10 0 - 5 /[HPF]     RBC, UA 6-10 0 - 2 /[HPF]    Epithelial Cells, Wet Prep 0-2 3 -  5 /[HPF]    Bacteria, UA Few (1-5) Negative NA    MUCOUS THREADS #/AREA URNS HPF Moderate Negative NA    Hyaline Casts, UA 0-2 0 - 1 /[LPF]       RADIOLOGY:  Interpreted by Radiologist.  [place]   Final Result      XR Chest Portable   Final Result            EKG Interpretation  Interpreted by emergency department physician    Rhythm: Sinus rhythm  Rate: 55  Axis: normal  Conduction: right bundle branch block (incomplete)  ST Segments: nonspecific changes  T Waves: non specific changes    Clinical Impression: Sinus rhythm, normal axis with right incomplete bundle-branch block and nonspecific ST segment changes.  Comparison to prior EKG: no previous EKG      ------------------------- NURSING NOTES AND VITALS REVIEWED ---------------------------   The nursing notes within the ED encounter and vital signs as below have been reviewed by myself.  BP 125/73  Pulse 60  Temp 98 F (36.7 C) (Oral)   Resp 16  Wt 85.3 kg (188 lb)  SpO2 99%  BMI 26.98 kg/m2  Oxygen Saturation Interpretation: Normal    [name] available past medical records and past encounters were reviewed.        ------------------------------ ED COURSE/MEDICAL DECISION MAKING----------------------  Medications   naloxone (NARCAN) injection 2 mg (not administered)   0.9 % sodium chloride infusion (not administered)   clindamycin (CLEOCIN) capsule 300 mg (not administered)   naloxone (NARCAN) injection 2 mg (2 mg Intramuscular Given 11/28/15 2239)   ketamine (KETALAR) injection 90 mg (90 mg Intravenous Given by Other 11/29/15 0052)   propofol 1000 MG/100ML injection 90 mg (0 mg Intravenous Stopped 11/29/15 0114)   propofol 1000 MG/100ML injection 120 mg (0 mg Intravenous Stopped 11/29/15 0114)             Medical Decision Making:      Patient was evaluated for his altered mental status.  I suspect his altered mental status is due to heroin overdose.  Urinalysis was negative  for infection rather contamination.  Urine tox screen was negative.  The patient potentially took a designer drug that does not show up on our rudimentary toxicology screen.  Complete blood count was within normal range.  Complete metabolic panel showed glucose of 226 and elevated alkaline phosphatase of 124 and elevated aspartate aminotransferase of 166.  Troponin was normal.  Alcohol was normal.    The patient was given intramuscular Narcan.  The patient became voluntarily uncooperative with the remainder of the workup.  I did want to obtain a computed axial tomography scan of his head to evaluate for any acute intracranial abnormalities for the patient could be medically cleared to go to jail for his felony warrant.  The patient had multiple computed axial tomography scans attempted however he voluntarily would twitch when the computed axial tomography scan was being performed therefore we could not get a good image.  Ironically when the patient was not being observed her needed to sit still for the computed axial tomography scan he could sit still without any difficulty.  I suspect the patient is purposely twitching to avoid completing the workup so he avoids being discharged into police custody.  Because the patient refused to sit still for the computed axial tomography scan I did discuss with him a conscious sedation to perform the computed axial tomography scan.  The patient did consent to have  the conscious sedation performed.  There is some benefits were explained and the patient agreed to proceed.    -------------------------------- Conscious Sedation Procedure Note -----------------------------  Patient Name: Mason Garza   Medical Record Number: [id number]  Date: 11/29/15   Time: 12:40 AM   Room/Bed: 12/12    Indication: Sedation for procedure of computed axial tomography scan of the brain  Consent: Consent was unable to be obtained due to patient's condition.  Physician Involvement: The attending  physician was present and supervising this procedure.    11/29/15     Time: 1250 (pre-procedure start time)  Pre-Sedation Documentation and Exam: I have personally completed a history, physical exam & review of systems for this patient (see notes).  Airway Assessment: normal  Prior History of Anesthesia Complications: none  ASA Classification: Class 1 - A normal healthy patient  Sedation/ Anesthesia Plan: intravenous sedation  Medications Used: propofol intravenously and ketamine 90 mg intravenously  Monitoring and Safety: The patient was placed on a cardiac monitor and vital signs, pulse oximetry and level of consciousness were continuously evaluated throughout the procedure. The patient was closely monitored until recovery from the medications was complete and the patient had returned to baseline status. Respiratory therapy was on standby at all times during the procedure.    ----------------------------------- Post Conscious Sedation Note -----------------------------------  (The following Post Sedation note must be completed)  11/29/15     Time: 0105 (as per nursing note stop time)  Post-Sedation Vital Signs: Vital signs were reviewed and were stable after the procedure (see flow sheet for vitals)       Post-Sedation Exam: Lungs: clear to auscultation bilaterally       Complications: none    Electronically Signed by: Jorene Minors, MD       The patient required extra bolus of propofol most likely because of his chronic opioid abuse.    CT head was negative for any acute findings.     [name] workup was negative for any acute findings for his altered mental status I suspect is due to his drug abuse.  The patient is medically cleared to be discharged into the custody of the police.  He was told to follow-up for recheck with his primary care physician.  He was advised to stop abusing drugs.    The patient does have some erythema on his left forearm consistent with a mild cellulitis.  The patient will be  placed on oral clindamycin and given a prescription for discharge.  The patient was told return for symptoms were to worsen.        Re-Evaluations:             Re-evaluation.  Patient's symptoms are improving            This patient's ED course included: a personal history and physicial examination, re-evaluation prior to disposition, multiple bedside re-evaluations, IV medications, cardiac monitoring, continuous pulse oximetry and a personal history and physicial eaxmination    This patient has remained hemodynamically stable and improved during their ED course.    Counseling:   The emergency provider has spoken with the patient and discussed today's results, in addition to providing specific details for the plan of care and counseling regarding the diagnosis and prognosis.  Questions are answered at this time and they are agreeable with the plan.       --------------------------------- IMPRESSION AND DISPOSITION ---------------------------------    IMPRESSION  1. Heroin overdose, accidental or unintentional, initial encounter  2. Cellulitis of left upper extremity        DISPOSITION  Disposition: Discharge to home  Patient condition is stable        NOTE: This report was transcribed using voice recognition software. Every effort was made to ensure accuracy; however, inadvertent computerized transcription errors may be present         Jorene Minors, MD  11/29/15 0126       Jorene Minors, MD  11/29/15 [phone]

## 2015-11-28 NOTE — ED Notes (Signed)
ASSESSMENT= Patient is alert and oriented x 3. Speech is clear and coherent. Patient able to answer all questions, albeit with slight delay but responses are coherent and appropriate. Patient does appear fatigued. Skin color is normal for race. Respirations are regular and unlabored. Lungs CTA. Radial pulses strong bilaterally. Cap refill less than 3 seconds. Patient does have well healing scab in left A/C as well as erythematous area to left forearm that is raised. Skin is intact. BS x 4. ABD is soft, non tender, and non distended. Patient does not guard with palpation. No swelling in lower extremities. Pedal pulses strong bilaterally. No visible signs of distress present All other systems WNL. Will continue to evaluate.       Roswell MinersJoseph Marcy Sookdeo, RN  11/28/15 2104

## 2015-11-28 NOTE — ED Notes (Signed)
MD was notified that RN was unsuccessful with catheterization, and MD successfully obtained urine with cudea catheter. Specimen given to   PD at bedside and specimen also sent to lab. Will continue to evaluate.       Roswell MinersJoseph Thaine Garriga, RN  11/28/15 2206

## 2015-11-29 ENCOUNTER — Inpatient Hospital Stay
Admit: 2015-11-29 | Discharge: 2015-11-29 | Disposition: A | Discharge status: Home / Self Care | Attending: Emergency Medicine

## 2015-11-29 LAB — COMPREHENSIVE METABOLIC PANEL
ALT: 71 U/L (ref 12–78)
AST: 166 U/L — ABNORMAL HIGH (ref 15–37)
Albumin,Serum: 3.5 g/dL (ref 3.4–5.0)
Alkaline Phosphatase: 124 U/L — ABNORMAL HIGH (ref 45–117)
Anion Gap: 9 NA
BUN: 14 mg/dL (ref 7–25)
CO2: 28 mmol/L (ref 21–32)
Calcium: 9.4 mg/dL (ref 8.2–10.1)
Chloride: 107 mmol/L (ref 98–109)
Creatinine: 0.87 mg/dL (ref 0.55–1.40)
EGFR IF NonAfrican American: >60 mL/min (ref >60)
Glucose: 226 mg/dL — ABNORMAL HIGH (ref 70–100)
Potassium: 4.6 mmol/L (ref 3.5–5.1)
Sodium: 144 mmol/L (ref 135–145)
Total Bilirubin: 0.6 mg/dL (ref 0.2–1.0)
Total Protein: 7.9 g/dL (ref 6.4–8.2)
eGFR African American: >60 mL/min (ref >60)

## 2015-11-29 LAB — CBC WITH AUTO DIFFERENTIAL
Absolute Baso #: 0.1 10*3/uL (ref 0.0–0.2)
Absolute Eos #: 0 10*3/uL (ref 0.0–0.5)
Absolute Lymph #: 1.2 10*3/uL (ref 1.0–4.3)
Absolute Mono #: 1 10*3/uL — ABNORMAL HIGH (ref 0.0–0.8)
Absolute Neut #: 7.5 10*3/uL — ABNORMAL HIGH (ref 1.8–7.0)
Basophils: 0.8 %
Eosinophils: 0 %
Granulocytes %: 76.7 %
Hematocrit: 40.6 % (ref 40.0–52.0)
Hemoglobin: 13.4 g/dL (ref 13.0–18.0)
Lymphocyte %: 12.3 %
MCH: 28.2 pg (ref 26.0–34.0)
MCHC: 33.1 % (ref 32.0–36.0)
MCV: 85.2 fL (ref 80.0–98.0)
MPV: 7.7 fL (ref 7.4–10.4)
Monocytes: 10.2 %
Platelets: 211 10*3/uL (ref 140–440)
RBC: 4.77 10*6/uL (ref 4.40–5.90)
RDW: 14.2 % (ref 11.5–14.5)
WBC: 9.8 10*3/uL (ref 3.6–10.7)

## 2015-11-29 LAB — URINALYSIS
Bilirubin, Urine: POSITIVE NA
Nitrite, Urine: NEGATIVE NA
Specific Gravity, Urine: 1.029 NA (ref 1.005–1.030)
Total Protein, Urine: NEGATIVE mg/dL
Urobilinogen, Urine: 1 mg/dL (ref 0–1)
pH, Urine: 5.5 NA (ref 5.0–8.0)

## 2015-11-29 LAB — TROPONIN: Troponin I: <0.015 ng/mL (ref 0.000–0.045)

## 2015-11-29 LAB — URINALYSIS WITH MICROSCOPIC

## 2015-11-29 LAB — URINE DRUG SCREEN
Amphetamines, urine: NEGATIVE NA
Barbiturates, Urine: NEGATIVE NA
Benzodiazepine Ur Qual: NEGATIVE NA
Cocaine Metabolites, Ur: NEGATIVE NA
Methadone, Urine: NEGATIVE NA
Opiates, Urine: NEGATIVE NA
Oxycodone Screen, Ur: NEGATIVE NA
PCP, Urine: NEGATIVE NA

## 2015-11-29 LAB — ETHANOL: Ethanol Lvl: <0.003 g/dL (ref 0.000–0.003)

## 2015-11-29 MED ORDER — NALOXONE HCL 2 MG/2ML IJ SOSY
2 MG/ML | INTRAMUSCULAR | Status: DC | PRN
Start: 2015-11-29 — End: 2015-11-29

## 2015-11-29 MED ORDER — NALOXONE HCL 2 MG/2ML IJ SOSY
2 MG/ML | Freq: Once | INTRAMUSCULAR | Status: AC
Start: 2015-11-29 — End: 2015-11-28
  Administered 2015-11-29: 03:00:00 2 mg via INTRAMUSCULAR

## 2015-11-29 MED ORDER — SODIUM CHLORIDE 0.9 % IV SOLN
0.9 % | INTRAVENOUS | Status: DC
Start: 2015-11-29 — End: 2015-11-29

## 2015-11-29 MED ORDER — CLINDAMYCIN HCL 300 MG PO CAPS
300 MG | ORAL_CAPSULE | Freq: Three times a day (TID) | ORAL | 0 refills | Status: AC
Start: 2015-11-29 — End: 2015-12-09

## 2015-11-29 MED ORDER — CLINDAMYCIN HCL 150 MG PO CAPS
150 MG | Freq: Once | ORAL | Status: AC
Start: 2015-11-29 — End: 2015-11-29
  Administered 2015-11-29: 06:00:00 300 mg via ORAL

## 2015-11-29 MED ORDER — PROPOFOL 1000 MG/100ML IV EMUL
1000 MG/100ML | Freq: Once | INTRAVENOUS | Status: AC
Start: 2015-11-29 — End: 2015-11-29
  Administered 2015-11-29: 05:00:00 90 mg via INTRAVENOUS

## 2015-11-29 MED ORDER — KETAMINE HCL 100 MG/ML IJ SOLN
100 MG/ML | Freq: Once | INTRAMUSCULAR | Status: AC
Start: 2015-11-29 — End: 2015-11-29
  Administered 2015-11-29: 05:00:00 90 mg via INTRAVENOUS

## 2015-11-29 MED ORDER — PROPOFOL 1000 MG/100ML IV EMUL
1000 MG/100ML | Freq: Once | INTRAVENOUS | Status: AC
Start: 2015-11-29 — End: 2015-11-29
  Administered 2015-11-29: 05:00:00 120 mg via INTRAVENOUS

## 2015-11-29 MED ORDER — SODIUM CHLORIDE 0.9 % IV BOLUS
0.9 % | Freq: Once | INTRAVENOUS | Status: DC
Start: 2015-11-29 — End: 2015-11-28

## 2015-11-29 MED FILL — CLINDAMYCIN HCL 150 MG PO CAPS: 150 MG | ORAL | Qty: 2

## 2015-11-29 MED FILL — KETAMINE HCL 100 MG/ML IJ SOLN: 100 MG/ML | INTRAMUSCULAR | Qty: 5

## 2015-11-29 MED FILL — NALOXONE HCL 2 MG/2ML IJ SOSY: 2 MG/ML | INTRAMUSCULAR | Qty: 2

## 2015-11-29 MED FILL — DIPRIVAN 1000 MG/100ML IV EMUL: 1000 MG/100ML | INTRAVENOUS | Qty: 100

## 2015-11-29 NOTE — ED Notes (Signed)
Pt able to stand independently, pt dressed, speech clear, pt asking "what am i supposed to do know walk home?"     Pearl RiverAngel Oris Calmes, [place]  11/29/15 [phone]

## 2015-11-29 NOTE — ED Notes (Signed)
Pt able to answer name/DOB appropriately, pt took po medications with no complications, unlabored respirations, remains resting in bed     [place] Shantae Vantol, [place]  11/29/15 [phone]

## 2015-11-29 NOTE — ED Notes (Signed)
Patient beginning to wake up. Respirations are regular and unlabored. Will continue to evaluate. Please refer to conscious sedation paperwork for vitals prior to 0108.       Roswell MinersJoseph Reynolds Kittel, RN  11/29/15 [phone]

## 2015-11-29 NOTE — ED Notes (Signed)
Pt sitting up in bed independently looking around at staff, unlabored respirations     Robb MatarAngel Jeury Mcnab, RN  11/29/15 [phone]

## 2015-11-29 NOTE — ED Notes (Signed)
RN accompanied patient to CT and patient refused to lay still and was thrashing legs. Patient was able to verbalize understanding, when asked to lay still, but each subsequent time the CT scan began to work, behavior would be begin again. MD notified. This is patient's 3 time in CT scan with the same problem. MD advised to start new IV for patient, and patient is refusing to allow staff to start aforementioned IV.      [place] MinersJoseph Kenzo Ozment, RN  11/29/15 [phone]

## 2015-11-29 NOTE — ED Notes (Signed)
When MD spoke with patient, patient instantly begins voluntary twitching and when MD stops talking, behavior stops. Patient able to repeat back instructions RN and MD give him, but continues with behavior. Will continue to evaluate.      Mason MinersJoseph Karder Goodin, RN  11/29/15 [phone]

## 2015-11-29 NOTE — ED Notes (Signed)
Patient taken to CT scan with MD, 2 RN, and respiratory to get CT of head. Propofol of 90 mg given and ketamine given @ 0052, and another 120 mg of propofol all given by MD given @ 0059.      Roswell MinersJoseph Sheridan Gettel, RN  11/29/15 [phone]

## 2015-11-29 NOTE — ED Notes (Signed)
Consent obtained by patient for sedation to allow staff to obtain CT scan of head. Patient in agreement. Patient able to verbalize he is ok with procedure and that this may be the only way he is able to lay still. MD notified. Will continue to evaluate.      [place] MinersJoseph Ryin Schillo, RN  11/29/15 [phone]

## 2015-11-29 NOTE — Other (Unsigned)
Patient Acct Nbr: [id number]   Primary AUTH/CERT:   Primary Insurance Company Name: [employer]  Primary [facility] Plan [facility] [facility]  Primary Insurance Group Number:   Primary Insurance Plan Type: Health  Primary Insurance Policy Number: [id number]

## 2016-04-17 ENCOUNTER — Telehealth

## 2016-04-17 NOTE — Telephone Encounter (Signed)
PT was in ED at [facility] 04/13/16 for back pain. His wife called requesting Naproxen and a muscle relaxer. She states he was given both when discharged from ED but has run out and still having pain. The bottle was thrown away so She was unsure of name and dosage. Please advise

## 2016-04-22 MED ORDER — NAPROXEN 500 MG PO TABS
500 MG | ORAL_TABLET | Freq: Two times a day (BID) | ORAL | 3 refills | Status: DC | PRN
Start: 2016-04-22 — End: 2017-06-07

## 2016-04-22 NOTE — Telephone Encounter (Signed)
Please request ED visit note asap.  Patient no showed to his last 3 appointments.    Med dispense from external pharmacy shows naproxen 500, cyclobenzaprine 10.    Refill for naproxen sent to the pharmacy.  No muscle relaxant refill at this time.

## 2016-04-22 NOTE — Telephone Encounter (Signed)
Left message with records department requesting that they fax the notes from the 04/13/16 visit.

## 2016-05-24 NOTE — Telephone Encounter (Signed)
Patient has had multiple no shows and same day cancellations in our office.  Please review and if appropriate send him a warning letter.

## 2016-05-26 NOTE — Telephone Encounter (Signed)
No show policy letter mailed.

## 2017-04-29 ENCOUNTER — Inpatient Hospital Stay
Admit: 2017-04-29 | Discharge: 2017-04-29 | Disposition: A | Discharge status: Home / Self Care | Attending: Emergency Medicine

## 2017-04-29 DIAGNOSIS — T401X1A Poisoning by heroin, accidental (unintentional), initial encounter: Secondary | ICD-10-CM

## 2017-04-29 MED ORDER — IBUPROFEN 400 MG PO TABS
400 MG | Freq: Once | ORAL | Status: AC
Start: 2017-04-29 — End: 2017-04-29
  Administered 2017-04-29: 19:00:00 800 mg via ORAL

## 2017-04-29 MED FILL — IBUPROFEN 400 MG PO TABS: 400 MG | ORAL | Qty: 2

## 2017-04-29 NOTE — ED Provider Notes (Signed)
[facility] EMERGENCY DEPT  eMERGENCY dEPARTMENT eNCOUnter      Pt Name: Mason Garza  MRN: [id number]  [place]gan 11-Jan-1966  Date of evaluation: 04/29/2017  Provider: Junius Roads, MD     CHIEF COMPLAINT       Chief Complaint   Patient presents with   . Drug Overdose     Pt admits to injecting heroin today. AFD gave pt total of 4 mg narcan IN.         HISTORY OF PRESENT ILLNESS   (Location/Symptom, Timing/Onset, Context/Setting, Quality, Duration, Modifying Factors, Severity) Note limiting factors.   HPI    Mason Garza is a 51 y.o. male who presents to the emergency department After a heroin overdose.  Patient is to injecting heroin today.  He believes that a friend of his called the paramedics.  He was found unresponsive and treated with intranasal Narcan, 4 mg of Narcan waking patient.  On presentation here he is awake and alert following drowsy he answers questions appropriately.  He denies chest pain shortness breath or abdominal pain.  Denies nausea vomiting diarrhea.  Denies fevers chills cough or cold.  Denies any back or flank pain.  Denies any weakness numbness tingling of his extremities      Nursing Notes were reviewed.    REVIEW OF SYSTEMS    (2+ for level 4; 10+ for level 5)   Review of Systems Pertinent positives as above per history of present illness.  Other systems reviewed and found to be negative to a total of 10 systems reviewed.    PAST MEDICAL HISTORY     Past Medical History:   Diagnosis Date   . Chronic hepatitis C ([place]) 11/22/2015    active IVDU   . H/O echocardiogram 05/12/11    EF62%, mild LAE   . Neuropathy    . Not immune to hepatitis B virus     need to re-vaccinate   . Type II or unspecified type diabetes mellitus without mention of complication, not stated as uncontrolled        SURGICAL HISTORY     History reviewed. No pertinent surgical history.    CURRENT MEDICATIONS       Previous Medications    BUPROPION (WELLBUTRIN XL) 150 MG EXTENDED RELEASE TABLET    Take 1 tablet by mouth  every morning    GLUCOSE BLOOD VI TEST STRIPS (ACURA BLOOD GLUCOSE TEST) STRIP    1 each by In Vitro route daily As needed.    GLUCOSE MONITORING KIT (FREESTYLE) MONITORING KIT    1 kit by Does not apply route daily as needed (test blood sugar as needed) May have any glucometer that is covered by patient's insurance.    LANCET DEVICES (B-D LANCET DEVICE) MISC    Check blood sugars daily or when symptoms develop.    LANCETS MISC    Check blood sugars daily or when symptoms develop.    METFORMIN (GLUCOPHAGE) 1000 MG TABLET    Take 1 tablet by mouth 2 times daily (with meals)    NAPROXEN (NAPROSYN) 500 MG TABLET    Take 1 tablet by mouth 2 times daily as needed for Pain ((take with food))    PREGABALIN (LYRICA) 150 MG CAPSULE    Take 1 capsule by mouth 3 times daily    TRAZODONE (DESYREL) 100 MG TABLET    Take 1 tablet by mouth nightly       ALLERGIES     Patient has no known  allergies.    FAMILY HISTORY     History reviewed. No pertinent family history.     SOCIAL HISTORY       Social History     Social History   . Marital status: Divorced     Spouse name: N/A   . Number of children: N/A   . Years of education: N/A     Social History Main Topics   . Smoking status: Current Every Day Smoker     Packs/day: 1.00     Years: 30.00   . Smokeless tobacco: Current User     Types: Chew   . Alcohol use 0.0 oz/week      Comment: 6 pack per yr now; former 6-12 pk per day for 10 yrs   . Drug use: Yes     Frequency: 10.0 times per week     Types: Opiates       Comment: heroin IVDU daily   . Sexual activity: Not Asked     Other Topics Concern   . None     Social History Narrative    Living with mother now - improved meal intake and assistance with DM care as mother is a former IT sales professional           PHYSICAL EXAM    (up to 7 for level 4, 8 or more for level 5)     ED Triage Vitals [04/29/17 1223]   BP Temp Temp Source Pulse Resp SpO2 Height Weight   119/85 97.8 F (36.6 C) Oral 99 16 93 % '5\' 11"'$  (1.803 m) 180 lb (81.6 kg)        Physical Exam Vital signs reviewed in nurse's notes.  Patient is nontoxic in appearance.  No respiratory distress.  Head: Normocephalic, atraumatic  Eyes: Pupils are equal, round and reactive to light.  EOMI.  Conjunctiva clear.  Sclera anicteric  ENT: Mucous membranes moist.  Throat shows no erythema exudates or edema.    Neck: No anterior adenopathy.  No tenderness or stiffness.  Chest: Nontender.  No obvious flail segments.  Lungs: Clear to auscultation bilaterally.  No wheezing rales or rhonchi.  Heart: Regular rate and rhythm.  No audible murmur or gallop.  Abdomen: Soft, nondistended, nontender.  No rebound or guarding.  No signs of peritonitis.  Back: No midline tenderness.  No flank area tenderness.  Extremities: No gross deformity.  No obvious tenderness.  No obvious joint swelling.  No calf tenderness.  Negative Homans sign.  Good distal pulses in all 4 extremities.  Neurologic: Alert and fully oriented.  No focal motor, sensory, or reflex deficits in all 4 extremities.  Cranial nerves II through XII grossly intact.  Cerebellar testing intact.    DIAGNOSTIC RESULTS       EMERGENCY DEPARTMENT COURSE and DIFFERENTIAL DIAGNOSIS/MDM:   Vitals:    Vitals:    04/29/17 1223 04/29/17 1350 04/29/17 1450   BP: 119/85 104/69 94/70   Pulse: 99 78 70   Resp: '16 14 14   '$ Temp: 97.8 F (36.6 C)     TempSrc: Oral     SpO2: 93% 92% 98%   Weight: 81.6 kg (180 lb)     Height: '5\' 11"'$  (1.803 m)         Medications - No data to display    MDM: Patient is status post narcotic overdose.  He is observed here in the Emergency Room for almost 3 hours.  He is  discharged home with family.  He is instructed on importance of obtaining outpatient detox treatment and substance abuse treatment.  Patient is discharged in stable condition    REVAL:   Emergency Room course: Patient has multiple serial examinations.  Although he is sleepy he awakens easily.  His pupils react appropriately.  He does not have pinpoint pupils.  He has  normal neurologic exams on frequent repeat exams.    PROCEDURES:  Unless otherwise noted below, none     Procedures    FINAL IMPRESSION      1. Accidental overdose of heroin, initial encounter          DISPOSITION/PLAN   DISPOSITION Decision To Discharge 04/29/2017 03:05:31 PM      PATIENT REFERRED TO:  Omelia [address], [place] [address]  [phone]    Call in 2 days        DISCHARGE MEDICATIONS:  New Prescriptions    No medications on file          (Please note:  Portions of this note were completed with a voice recognition program.  Efforts were made to edit the dictations but occasionally words and phrases are mis-transcribed.)  Form v2016.J.5-cn    Junius Roads, MD (electronically signed)  Emergency Medicine Provider        Junius Roads, MD  04/29/17 [phone]

## 2017-04-29 NOTE — ED Notes (Signed)
Dr Lennox PippinsMArkowski and RN to bedside to speak with pt. Pt notified that he is able to leave with sober ride. Pt called for ride and has someone coming to pick him up     Henrene Pastorourtney Randolf Sansoucie, RN  04/29/17 1451

## 2017-04-29 NOTE — ED Notes (Signed)
Sober ride arrived for pt. Pt able to ambulate to WR without difficulty     Henrene PastorCourtney Kareli Hossain, RN  04/29/17 1523

## 2017-04-29 NOTE — ED Notes (Signed)
Bed: 08  Expected date:   Expected time:   Means of arrival:   Comments:  EMS when clean     Mason Garza A. Marga HootsOakley, RN  04/29/17 1223

## 2017-04-29 NOTE — Other (Unsigned)
Patient Acct Nbr: [id number]   Primary AUTH/CERT:   Primary Insurance Company Name: [employer]  Primary Insurance Plan name: [facility]  Primary Insurance Group Number:   Primary Insurance Plan Type: Health  Primary Insurance Policy Number: [id number]

## 2017-04-29 NOTE — ED Notes (Signed)
Pt is groggy but able to answer questions     Henrene PastorCourtney Aleiah Mohammed, RN  04/29/17 1225

## 2017-05-16 ENCOUNTER — Inpatient Hospital Stay
Admit: 2017-05-16 | Discharge: 2017-05-16 | Disposition: A | Discharge status: Home / Self Care | Attending: Emergency Medicine

## 2017-05-16 DIAGNOSIS — R531 Weakness: Secondary | ICD-10-CM

## 2017-05-16 LAB — URINE DRUG SCREEN
Amphetamines, urine: POSITIVE NA
Barbiturates, Urine: NEGATIVE NA
Benzodiazepine Ur Qual: NEGATIVE NA
Cocaine Metabolites, Ur: NEGATIVE NA
Methadone, Urine: NEGATIVE NA
Opiates, Urine: NEGATIVE NA
Oxycodone Screen, Ur: NEGATIVE NA
PCP, Urine: NEGATIVE NA

## 2017-05-16 LAB — BLOOD GAS, ARTERIAL
Base Excess, Arterial: 0.5 mmol/L (ref <=3.0)
HCO3, Arterial: 23.4 mmol/L (ref 21.0–25.0)
Hemoglobin, Art, Extended: 13.2 g/dL
O2 Sat, Arterial: 96.3 % (ref 95.0–100.0)
TCO2, Arterial: 24.4 mmol/L (ref 23.0–27.0)
pCO2, Arterial: 32.8 mmHg — ABNORMAL LOW (ref 35.0–45.0)
pH, Arterial: 7.472 NA — ABNORMAL HIGH (ref 7.350–7.450)
pO2, Arterial: 81.9 mmHg (ref 80.0–100.0)

## 2017-05-16 LAB — URINALYSIS
Bilirubin, Urine: NEGATIVE NA
Ketones, Urine: NEGATIVE mg/dL
LEUKOCYTES, UA: NEGATIVE NA
Nitrite, Urine: NEGATIVE NA
Occult Blood,Urine: NEGATIVE {RBC}/uL
Specific Gravity, Urine: 1.005 NA (ref 1.005–1.030)
Total Protein, Urine: NEGATIVE mg/dL
pH, Urine: 5 NA (ref 5.0–8.0)

## 2017-05-16 LAB — CBC WITH AUTO DIFFERENTIAL
Hematocrit: 39.9 % — ABNORMAL LOW (ref 40.0–52.0)
Hemoglobin: 13.8 g/dL (ref 13.0–18.0)
MCH: 29.4 pg (ref 26.0–34.0)
MCHC: 34.5 % (ref 32.0–36.0)
MCV: 85.1 fL (ref 80.0–98.0)
MPV: 7.1 fL — ABNORMAL LOW (ref 7.4–10.4)
Platelets: 326 10*3/uL (ref 140–440)
RBC: 4.68 10*6/uL (ref 4.40–5.90)
RDW: 14.8 % — ABNORMAL HIGH (ref 11.5–14.5)
WBC: 10.5 10*3/uL (ref 3.6–10.7)

## 2017-05-16 LAB — MANUAL DIFFERENTIAL
Absolute Baso #: 0 10*3/uL (ref 0.0–0.2)
Absolute Eos #: 0 10*3/uL (ref 0.0–0.5)
Absolute Lymph #: 0.5 10*3/uL — ABNORMAL LOW (ref 1.1–4.5)
Absolute Mono #: 0.3 10*3/uL (ref 0.2–1.1)
Absolute Neut #: 9.7 10*3/uL — ABNORMAL HIGH (ref 2.2–8.2)
Bands: 17 % — ABNORMAL HIGH (ref 0–3)
Basophils: 0 % (ref 0–2)
Eosinophils: 0 % — ABNORMAL LOW (ref 1–6)
Lymphocytes: 5 % — ABNORMAL LOW (ref 20–40)
Monocytes: 3 % (ref 2–10)
RBC Morphology: NORMAL NA
Seg Neutrophils: 75 % (ref 40–80)
TOTAL CELLS COUNTED: 100 NA

## 2017-05-16 LAB — COMPREHENSIVE METABOLIC PANEL
ALT: 37 U/L (ref 13–69)
AST: 34 U/L (ref 15–46)
Albumin,Serum: 4.2 g/dL (ref 3.5–5.0)
Alkaline Phosphatase: 115 U/L (ref 38–126)
Anion Gap: 10 NA
BUN: 15 mg/dL (ref 7–20)
CO2: 24 mmol/L (ref 22–30)
Calcium: 9.3 mg/dL (ref 8.4–10.4)
Chloride: 103 mmol/L (ref 98–107)
Creatinine: 0.58 mg/dL (ref 0.52–1.25)
EGFR IF NonAfrican American: >60 mL/min (ref >60)
Glucose: 152 mg/dL — ABNORMAL HIGH (ref 70–100)
Potassium: 3.8 mmol/L (ref 3.5–5.1)
Sodium: 136 mmol/L — ABNORMAL LOW (ref 137–145)
Total Bilirubin: 0.5 mg/dL (ref 0.2–1.3)
Total Protein: 7.6 g/dL (ref 6.3–8.2)
eGFR African American: >60 mL/min (ref >60)

## 2017-05-16 LAB — CK: Total CK: 88 U/L (ref 30–170)

## 2017-05-16 LAB — TROPONIN: Troponin I: <0.012 ng/mL (ref 0.000–0.034)

## 2017-05-16 LAB — BETA-HYDROXYBUTYRATE: Beta-Hydroxybutyrate: 4.51 mg/dL — ABNORMAL HIGH (ref 0.20–2.81)

## 2017-05-16 LAB — ETHANOL: Ethanol Lvl: <0.01 g/dL (ref 0.000–0.010)

## 2017-05-16 MED ORDER — KETOROLAC TROMETHAMINE 30 MG/ML IJ SOLN
30 MG/ML | Freq: Once | INTRAMUSCULAR | Status: AC
Start: 2017-05-16 — End: 2017-05-16
  Administered 2017-05-16: 11:00:00 30 mg via INTRAVENOUS

## 2017-05-16 MED ORDER — SODIUM CHLORIDE 0.9 % IV BOLUS
0.9 % | Freq: Once | INTRAVENOUS | Status: AC
Start: 2017-05-16 — End: 2017-05-16
  Administered 2017-05-16: 08:00:00 2000 mL via INTRAVENOUS

## 2017-05-16 MED ORDER — NORMAL SALINE FLUSH 0.9 % IV SOLN
0.9 % | Freq: Three times a day (TID) | INTRAVENOUS | Status: DC
Start: 2017-05-16 — End: 2017-05-16
  Administered 2017-05-16: 08:00:00 3 mL via INTRAVENOUS

## 2017-05-16 NOTE — ED Notes (Signed)
Blood sent to lab. Pt resting quietly, respirations even and unlabored.      Clide Dales. [place], [place]  05/16/17 [phone]

## 2017-05-16 NOTE — ED Provider Notes (Signed)
Emergency Department Encounter  [facility] EMERGENCY DEPT    Patient: Mason Garza  MRN: [id number]  DOB: November 04, 1965  Date of Evaluation: 05/16/2017  ED APP Provider: Diannia Ruder, PA-C    ED care was supervised by Dr. Rhodia Albright who independently examined and evaluated the patient. Please see their attestation note for further details.    Chief Complaint       Chief Complaint   Patient presents with   . Abdominal Pain     HOPI     Mason Garza is a 51 y.o. male who presents to the emergency department With chief complaint of heart palpitations, generalized weakness, polyuria, polydipsia with body aches. Patient states symptoms have been going on for the past 4-5 days. He is a known diabetic and states he has been off of his metformin for over 4 weeks. Patient denies withdrawing from any drugs. He denies any cough or shortness of breath. Denies any chest pain.    ROS:     Review of Systems   Constitutional: Positive for activity change, appetite change, diaphoresis and fatigue.   HENT: Negative.    Eyes: Negative.    Respiratory: Negative.    Cardiovascular: Positive for palpitations.   Gastrointestinal: Positive for nausea.   Endocrine: Positive for polydipsia and polyuria.   Genitourinary: Positive for frequency.   Musculoskeletal: Positive for myalgias.   Skin: Negative.    Allergic/Immunologic: Negative.    Neurological: Positive for light-headedness.   Hematological: Negative.    Psychiatric/Behavioral: Negative.    All other systems reviewed and are negative.    At least 10 systems reviewed and otherwise acutely negative except as in the [place].    Past History     Past Medical History:   Diagnosis Date   . Chronic hepatitis C ([place]) 11/22/2015    active IVDU   . H/O echocardiogram 05/12/11    EF62%, mild LAE   . Neuropathy    . Not immune to hepatitis B virus     need to re-vaccinate   . Type II or unspecified type diabetes mellitus without mention of complication, not stated as uncontrolled      History  reviewed. No pertinent surgical history.  Social History     Social History   . Marital status: Divorced     Spouse name: N/A   . Number of children: N/A   . Years of education: N/A     Social History Main Topics   . Smoking status: Current Every Day Smoker     Packs/day: 1.00     Years: 30.00   . Smokeless tobacco: Current User     Types: Chew   . Alcohol use 0.0 oz/week      Comment: 6 pack per yr now; former 6-12 pk per day for 10 yrs   . Drug use: Yes     Frequency: 10.0 times per week     Types: Opiates       Comment: heroin IVDU daily   . Sexual activity: Not Asked     Other Topics Concern   . None     Social History Narrative    Living with mother now - improved meal intake and assistance with DM care as mother is a former Therapist, sports       Medications/Allergies     Previous Medications    BUPROPION (WELLBUTRIN XL) 150 MG EXTENDED RELEASE TABLET    Take 1 tablet by mouth every morning    GLUCOSE BLOOD VI  TEST STRIPS (ACURA BLOOD GLUCOSE TEST) STRIP    1 each by In Vitro route daily As needed.    GLUCOSE MONITORING KIT (FREESTYLE) MONITORING KIT    1 kit by Does not apply route daily as needed (test blood sugar as needed) May have any glucometer that is covered by patient's insurance.    LANCET DEVICES (B-D LANCET DEVICE) MISC    Check blood sugars daily or when symptoms develop.    LANCETS MISC    Check blood sugars daily or when symptoms develop.    METFORMIN (GLUCOPHAGE) 1000 MG TABLET    Take 1 tablet by mouth 2 times daily (with meals)    NAPROXEN (NAPROSYN) 500 MG TABLET    Take 1 tablet by mouth 2 times daily as needed for Pain ((take with food))    PREGABALIN (LYRICA) 150 MG CAPSULE    Take 1 capsule by mouth 3 times daily    TRAZODONE (DESYREL) 100 MG TABLET    Take 1 tablet by mouth nightly     No Known Allergies     Physical Exam       ED Triage Vitals [05/16/17 0340]   BP Temp Temp Source Pulse Resp SpO2 Height Weight   101/73 98.5 F (36.9 C) Oral 125 16 97 % '5\' 11"'  (1.803 m) 165 lb (74.8 kg)     Physical  Exam   Constitutional: He is oriented to person, place, and time. He appears well-developed and well-nourished. He is active and cooperative. He has a sickly appearance. No distress.   HENT:   Head: Normocephalic and atraumatic.   Right Ear: External ear normal.   Left Ear: External ear normal.   Nose: Nose normal.   Mouth/Throat: Oropharynx is clear and moist.   Eyes: Pupils are equal, round, and reactive to light. Conjunctivae are normal.   Neck: Normal range of motion. Neck supple.   Cardiovascular: Regular rhythm, normal heart sounds and intact distal pulses.  Tachycardia present.    Pulmonary/Chest: Effort normal and breath sounds normal. No respiratory distress. He has no wheezes. He has no rales. He exhibits no tenderness.   Abdominal: Soft. Bowel sounds are normal. There is no tenderness. There is no rebound.   Musculoskeletal: Normal range of motion.   Neurological: He is alert and oriented to person, place, and time.   Skin: Skin is warm and dry. He is not diaphoretic.   Psychiatric: He has a normal mood and affect. His behavior is normal. Judgment and thought content normal.   Nursing note and vitals reviewed.      Diagnostics   Labs:  No results found for this visit on 05/16/17.  Radiographs:  No results found.    Procedures:       EKG: All EKG's are interpreted by the Emergency Department Physician in the absence of a cardiologist. Please see their note for interpretation of EKG.    ED Course and MDM           In brief, Mason Garza is a 51 y.o. male who presented to the emergency department With chief complaint of heart palpitations, generalized weakness, polyuria, polydipsia with body aches. Patient states symptoms have been going on for the past 4-5 days. He is a known diabetic and states he has been off of his metformin for over 4 weeks. Patient denies withdrawing from any drugs. He denies any cough or shortness of breath. Denies any chest pain.    0500 hours  I have signed out  Mason Garza Emergency Department care to Dr. Rhodia Albright. We discussed the pertinent history, physical exam, completed/pending test results (if applicable) and current treatment plan. Please refer to his/her chart for the patients remaining Emergency Department course and final disposition.        ED Medication Orders     Start Ordered     Status Ordering Provider    05/16/17 0400 05/16/17 0358  sodium chloride flush 0.9 % injection 3 mL  EVERY 8 HOURS      Ordered Taylinn Brabant    05/16/17 0400 05/16/17 0358  0.9 % sodium chloride bolus  ONCE      Ordered Vasti Yagi          Final Impression      1. Generalized weakness    2. Nausea    3. Heart palpitations        DISPOSITION       (Please note that portions of this note may have been completed with a voice recognition program. Efforts were made to edit the dictations but occasionally words are mis-transcribed.)    Diannia Ruder, PA-C  [employer]     Kejuan Bekker, [place]  05/16/17 [phone]

## 2017-05-16 NOTE — ED Triage Notes (Signed)
Pt A&Ox3. Pt c/o abdominal pain, dizziness, diarrhea, and body aches x 3 days.

## 2017-05-16 NOTE — ED Notes (Signed)
Blood cultures set 1 & 2 sent to lab.      Clide Dalesanielle M. [place], [place]  05/16/17 [phone]

## 2017-05-16 NOTE — ED Notes (Signed)
Pt ambulates with steady gait to restroom.      Clide Dales. [place], RN  05/16/17 [phone]

## 2017-05-16 NOTE — ED Notes (Signed)
RN called lab and clarified with lab tech that they could run newly ordered troponin from the green top already sent.      Clide Dalesanielle M. [place], [place]  05/16/17 [phone]

## 2017-05-16 NOTE — ED Provider Notes (Signed)
Emergency Department Encounter  [facility] EMERGENCY DEPT    Patient: Mason Garza  MRN: [id number]  DOB: 1966-04-06  Date of Evaluation: 05/16/2017  ED Supervising Physician: Lamonte Sakai, MD    I independently examined and evaluated Mason Garza.    In brief, Mason Garza is a 51 y.o. male that presents to the emergency department for the evaluation of heart palpitations, weakness, polyuria, polydipsia, body aches.  Out of his metformin for 4 weeks.    History of drug use.  Endorses using different types of drugs recently.  Heroin and meth at least.    Focused exam:   Height: 5\' 11"  (1.803 m), Weight: 74.8 kg (165 lb), BP: 101/73  heart is tachycardic.  Lungs are clear.  He is generally fatigued appearing.    Brief ED course/MDM:   Initial workup for DKA given history of poorly controlled sugars.  However pH is 7.4.  Blood cultures were sent given history of IV drug use.  White blood cell count is 10.5 and there is a bit of a left shift.  Beta hydroxybutyrate is elevated at 4.51.  Total CK is 88.  Troponin is negative.    Given the symptoms are continuing, I will ultimately need a CT of his abdomen and pelvis given that I cannot find anything else to explain his abdominal pain.  Patient will be signed out to 19.  Anticipate discharge home with abdominal pain of unspecific etiology.    Heart Score for chest pain patients  History: Slightly Suspicious  ECG: Non-Specifc repolarization disturbance/LBTB/PM  Patient Age: > 45 and < 65 years  *Risk factors for Atherosclerotic disease: Diabetes Mellitus  Risk Factors: 1 or 2 risk factors  Troponin: < 1X normal limit  Heart Score Total: 3           All diagnostic, treatment, and disposition decisions were made by myself in conjunction with the APP/Resident. For all further details of the patient's emergency department visit, please see their documentation.    (Please note that portions of this note may have been completed with a voice recognition program. Efforts were  made to edit the dictations but occasionally words are mis-transcribed.)    Lamonte Sakai, MD  [employer]          Lamonte Sakai, MD  05/31/17 0021

## 2017-05-16 NOTE — ED Notes (Signed)
Provider at bedside.      Clide Dales. [place], [place]  05/16/17 [phone]

## 2017-05-16 NOTE — Other (Unsigned)
Patient Acct Nbr: [id number]   Primary AUTH/CERT:   Primary Insurance Company Name: [employer]  Primary Insurance Plan name: [facility]  Primary Insurance Group Number:   Primary Insurance Plan Type: Health  Primary Insurance Policy Number: [id number]

## 2017-05-16 NOTE — ED Provider Notes (Signed)
Emergency Department Encounter  [facility]    Patient: Mason Garza  MRN: [id number]  DOB: 12/28/1965  Date of Evaluation: 05/16/2017  ED Supervising Physician: Autumn MessingZachary Arantza [place], DO    I independently examined and evaluated Mason Garza.    In brief, Mason Garza is a 51 y.o. male that presents for numerous complaints.  Please see the patient's prior providers note for full history and physical of the patient was signed out to me.  Upon my evaluation, the patient is sleeping and appears comfortable.  He denies all complaints.  Denies any fever, sweats, chills, nausea, vomiting, abdominal pain, chest pain or knee pain.  The patient was pending a CT of his abdomen.    Focused exam: Initially sleeping in no acute distress.  Regular rate and rhythm.  No diaphoresis.  Soft, flat nontender abdomen.  Dorsalis pedis pulse 2+.  Radial pulses 2+.  Alert and oriented.    Brief ED course/MDM: Patient's a 51 year old gentleman who presents emergency department with the bone pain, chest pain and knee pain.  Patient is knee pain is not reproducible on examination and he does not qualify for any x-ray according to the [facility] knee rules.  Patient's heart score is 3 and he is a negative troponin.  I believe this can be worked up as an outpatient for potential risk stratification.  Patient is pending a CT abdomen pelvis with contrast for his ultimate disposition.    Patient's CT abdomen pelvis with contrast was interpreted by radiology and reviewed myself.  There is mild gallbladder distention but no other abnormalities identified.    Repeat examination revealed the patient to be continued to be sleeping.  Again, the patient abdomen soft, flat nontender.  [name] other workup is otherwise within normal limits.  The patient was encouraged to follow up with his primary care provider.    The patient was informed if they were to develop any new or concerning symptoms they should return to the emergency department  immediately.  They had no questions at the time of discharge and they were discharged in fair and stable condition.    Final impression:  1.  Abdominal pain not otherwise specified result  2.  Chest pain, low risk for acute cardiac syndrome    All diagnostic, treatment, and disposition decisions were made by myself in conjunction with the APP/Resident. For all further details of the patient's emergency department visit, please see their documentation.    (Please note that portions of this note may have been completed with a voice recognition program. Efforts were made to edit the dictations but occasionally words are mis-transcribed.)    Autumn MessingZachary Everardo Voris, DO  [employer]         Autumn MessingZachary Kasara Schomer, DO  05/16/17 1029

## 2017-05-16 NOTE — Progress Notes (Signed)
Culture result reviewed, no further treatment needed

## 2017-05-16 NOTE — ED Notes (Signed)
Bed: 24  Expected date:   Expected time:   Means of arrival:   Comments:     Lyman Speller. Maydelin Deming, RN  05/16/17 [phone]

## 2017-05-16 NOTE — ED Notes (Signed)
Portable x ray at bedside.      Clide Dales. [place], RN  05/16/17 [phone]

## 2017-05-16 NOTE — ED Notes (Signed)
Provider at bedside.      Clide Dalesanielle M. [place], [place]  05/16/17 [phone]

## 2017-05-21 LAB — CULTURE, BLOOD 2: Blood Culture, Routine: NO GROWTH

## 2017-05-21 LAB — CULTURE BLOOD #1: Blood Culture, Routine: NO GROWTH

## 2017-05-29 ENCOUNTER — Inpatient Hospital Stay
Admit: 2017-05-29 | Discharge: 2017-06-01 | Discharge status: Against Medical Advice | Payer: MEDICAID | Source: Ambulatory Visit | Admitting: Internal Medicine

## 2017-05-29 DIAGNOSIS — L03115 Cellulitis of right lower limb: Secondary | ICD-10-CM

## 2017-05-29 LAB — BASIC METABOLIC PANEL
Anion Gap: 7 NA
BUN: 13 mg/dL (ref 7–20)
CO2: 27 mmol/L (ref 22–30)
Calcium: 8.9 mg/dL (ref 8.4–10.4)
Chloride: 101 mmol/L (ref 98–107)
Creatinine: 0.53 mg/dL (ref 0.52–1.25)
EGFR IF NonAfrican American: >60 mL/min (ref >60)
Glucose: 110 mg/dL — ABNORMAL HIGH (ref 70–100)
Potassium: 5.1 mmol/L (ref 3.5–5.1)
Sodium: 136 mmol/L — ABNORMAL LOW (ref 137–145)
eGFR African American: >60 mL/min (ref >60)

## 2017-05-29 LAB — POCT GLUCOSE: POC Glucose: 86 mg/dL (ref 70–100)

## 2017-05-29 LAB — CBC WITH AUTO DIFFERENTIAL
Absolute Baso #: 0.1 10*3/uL (ref 0.0–0.2)
Absolute Eos #: 0 10*3/uL (ref 0.0–0.5)
Absolute Lymph #: 1.7 10*3/uL (ref 1.0–4.3)
Absolute Mono #: 0.7 10*3/uL (ref 0.0–0.8)
Absolute Neut #: 6.5 10*3/uL (ref 1.8–7.0)
Basophils: 0.7 % (ref 0.0–2.0)
Eosinophils: 0 % — ABNORMAL LOW (ref 1.0–6.0)
Granulocytes %: 73.2 % (ref 40.0–80.0)
Hematocrit: 38.5 % — ABNORMAL LOW (ref 40.0–52.0)
Hemoglobin: 13.1 g/dL (ref 13.0–18.0)
Lymphocyte %: 18.6 % — ABNORMAL LOW (ref 20.0–40.0)
MCH: 28.7 pg (ref 26.0–34.0)
MCHC: 34.1 % (ref 32.0–36.0)
MCV: 84 fL (ref 80.0–98.0)
MPV: 7.1 fL — ABNORMAL LOW (ref 7.4–10.4)
Monocytes: 7.5 % (ref 2.0–10.0)
Platelets: 250 10*3/uL (ref 140–440)
RBC: 4.58 10*6/uL (ref 4.40–5.90)
RDW: 14.5 % (ref 11.5–14.5)
WBC: 8.9 10*3/uL (ref 3.6–10.7)

## 2017-05-29 MED ORDER — TRAZODONE HCL 100 MG PO TABS
100 MG | Freq: Every evening | ORAL | Status: DC
Start: 2017-05-29 — End: 2017-05-31

## 2017-05-29 MED ORDER — INFLUENZA VAC SPLIT QUAD 0.5 ML IM SUSY
0.5 ML | Freq: Once | INTRAMUSCULAR | Status: AC
Start: 2017-05-29 — End: 2017-05-29
  Administered 2017-05-29: 21:00:00 0.5 mL via INTRAMUSCULAR

## 2017-05-29 MED ORDER — AMPICILLIN-SULBACTAM SODIUM 3 (2-1) G IV SOLR
32-1 (2-1) g | Freq: Four times a day (QID) | INTRAVENOUS | Status: DC
Start: 2017-05-29 — End: 2017-05-31
  Administered 2017-05-29 – 2017-05-31 (×8): 3 g via INTRAVENOUS

## 2017-05-29 MED ORDER — NORMAL SALINE FLUSH 0.9 % IV SOLN
0.9 % | INTRAVENOUS | Status: DC | PRN
Start: 2017-05-29 — End: 2017-05-31

## 2017-05-29 MED ORDER — MAGNESIUM HYDROXIDE 400 MG/5ML PO SUSP
400 MG/5ML | Freq: Every day | ORAL | Status: DC | PRN
Start: 2017-05-29 — End: 2017-05-29

## 2017-05-29 MED ORDER — DEXTROSE 5 % IV SOLN
5 % | Freq: Two times a day (BID) | INTRAVENOUS | Status: DC
Start: 2017-05-29 — End: 2017-05-31
  Administered 2017-05-30 – 2017-05-31 (×4): 1500 mg/kg via INTRAVENOUS

## 2017-05-29 MED ORDER — NORMAL SALINE FLUSH 0.9 % IV SOLN
0.9 % | Freq: Three times a day (TID) | INTRAVENOUS | Status: DC
Start: 2017-05-29 — End: 2017-05-31
  Administered 2017-05-31: 22:00:00 3 mL via INTRAVENOUS

## 2017-05-29 MED ORDER — SODIUM CHLORIDE 0.9 % IV BOLUS
0.9 % | Freq: Once | INTRAVENOUS | Status: AC
Start: 2017-05-29 — End: 2017-05-29
  Administered 2017-05-29: 16:00:00 500 mL via INTRAVENOUS

## 2017-05-29 MED ORDER — MORPHINE SULFATE 2 MG/ML IJ SOLN
2 MG/ML | INTRAMUSCULAR | Status: AC | PRN
Start: 2017-05-29 — End: 2017-05-29
  Administered 2017-05-29 (×2): 4 mg via INTRAVENOUS

## 2017-05-29 MED ORDER — NAPROXEN 500 MG PO TABS
500 MG | Freq: Two times a day (BID) | ORAL | Status: DC
Start: 2017-05-29 — End: 2017-05-31
  Administered 2017-05-29 – 2017-05-31 (×4): 500 mg via ORAL

## 2017-05-29 MED ORDER — NORMAL SALINE FLUSH 0.9 % IV SOLN
0.9 % | Freq: Two times a day (BID) | INTRAVENOUS | Status: DC
Start: 2017-05-29 — End: 2017-05-31

## 2017-05-29 MED ORDER — ENOXAPARIN SODIUM 40 MG/0.4ML SC SOLN
40 MG/0.4ML | Freq: Every day | SUBCUTANEOUS | Status: DC
Start: 2017-05-29 — End: 2017-05-31
  Administered 2017-05-30 – 2017-05-31 (×2): 40 mg via SUBCUTANEOUS

## 2017-05-29 MED ORDER — SODIUM CHLORIDE 0.9 % IV SOLN
0.9 % | INTRAVENOUS | Status: DC
Start: 2017-05-29 — End: 2017-05-31
  Administered 2017-05-29: 16:00:00 via INTRAVENOUS

## 2017-05-29 MED ORDER — VANCOMYCIN HCL IN DEXTROSE 1-5 GM/200ML-% IV SOLN
1-5 GM/200ML-% | Freq: Once | INTRAVENOUS | Status: AC
Start: 2017-05-29 — End: 2017-05-29
  Administered 2017-05-29: 16:00:00 1000 mg/kg via INTRAVENOUS

## 2017-05-29 MED ORDER — ONDANSETRON HCL 4 MG/2ML IJ SOLN
4 MG/2ML | Freq: Once | INTRAMUSCULAR | Status: AC
Start: 2017-05-29 — End: 2017-05-29
  Administered 2017-05-29: 16:00:00 4 mg via INTRAVENOUS

## 2017-05-29 MED ORDER — VANCOMYCIN HCL IN DEXTROSE 1-5 GM/200ML-% IV SOLN
1-5 GM/200ML-% | Freq: Two times a day (BID) | INTRAVENOUS | Status: DC
Start: 2017-05-29 — End: 2017-05-29

## 2017-05-29 MED ORDER — ONDANSETRON HCL 4 MG/2ML IJ SOLN
4 MG/2ML | Freq: Four times a day (QID) | INTRAMUSCULAR | Status: DC | PRN
Start: 2017-05-29 — End: 2017-05-29

## 2017-05-29 MED ORDER — LIDOCAINE HCL 2 % IJ SOLN
2 % | Freq: Once | INTRAMUSCULAR | Status: AC
Start: 2017-05-29 — End: 2017-05-29
  Administered 2017-05-29: 18:00:00 20 mL

## 2017-05-29 MED ORDER — BUPROPION HCL ER (XL) 150 MG PO TB24
150 MG | Freq: Every morning | ORAL | Status: DC
Start: 2017-05-29 — End: 2017-05-29

## 2017-05-29 MED FILL — MORPHINE SULFATE 2 MG/ML IJ SOLN: 2 mg/mL | INTRAMUSCULAR | Qty: 2 | Fill #0

## 2017-05-29 MED FILL — XYLOCAINE 2 % IJ SOLN: 2 % | INTRAMUSCULAR | Qty: 20

## 2017-05-29 NOTE — ED Notes (Signed)
Bed: 07  Expected date:   Expected time:   Means of arrival:   Comments:  Abscess to the neck      Alfredo Bach, RN  05/29/17 1009

## 2017-05-29 NOTE — ED Provider Notes (Signed)
Emergency Department Encounter  [facility] EMERGENCY DEPT    Patient: Mason Garza  MRN: [id number]  DOB: December 10, 1965  Date of Evaluation: 05/29/2017  ED APP Provider: Janean Sark, APRN - CNP    ED care was supervised by Dr. Shawna Orleans who independently examined and evaluated the patient. Please see their attestation note for further details.    Chief Complaint       Chief Complaint   Patient presents with   . Abscess     Right lower leg. x 3 days.      HOPI     Mason Garza is a 51 y.o. male who presents to the emergency department With 2 days of gradually worsening skin erythema, warmth, and tenderness.  Patient has history of type 2 diabetes and is noncompliant with metformin.  This is a history of IV drug use and is injected in his left lower extremity but not his right.  He had no fevers or chills and no nausea or vomiting.  He has been walking with a crutch to help avoid exacerbating pain.    ROS:     Review of Systems  At least 10 systems reviewed and otherwise acutely negative except as in the [place].    Past History     Past Medical History:   Diagnosis Date   . Chronic hepatitis C ([place]) 11/22/2015    active IVDU   . H/O echocardiogram 05/12/11    EF62%, mild LAE   . Neuropathy    . Not immune to hepatitis B virus     need to re-vaccinate   . Type II or unspecified type diabetes mellitus without mention of complication, not stated as uncontrolled      History reviewed. No pertinent surgical history.  Social History     Social History   . Marital status: Divorced     Spouse name: N/A   . Number of children: N/A   . Years of education: N/A     Social History Main Topics   . Smoking status: Current Every Day Smoker     Packs/day: 1.00     Years: 30.00   . Smokeless tobacco: Current User     Types: Chew   . Alcohol use 0.0 oz/week      Comment: 6 pack per yr now; former 6-12 pk per day for 10 yrs   . Drug use: Yes     Frequency: 10.0 times per week     Types: Opiates       Comment: heroin IVDU daily   . Sexual  activity: Not Asked     Other Topics Concern   . None     Social History Narrative    Living with mother now - improved meal intake and assistance with DM care as mother is a former Therapist, sports       Medications/Allergies     Previous Medications    BUPROPION (WELLBUTRIN XL) 150 MG EXTENDED RELEASE TABLET    Take 1 tablet by mouth every morning    GLUCOSE BLOOD VI TEST STRIPS (ACURA BLOOD GLUCOSE TEST) STRIP    1 each by In Vitro route daily As needed.    GLUCOSE MONITORING KIT (FREESTYLE) MONITORING KIT    1 kit by Does not apply route daily as needed (test blood sugar as needed) May have any glucometer that is covered by patient's insurance.    LANCET DEVICES (B-D LANCET DEVICE) MISC    Check blood sugars daily or when symptoms  develop.    LANCETS MISC    Check blood sugars daily or when symptoms develop.    METFORMIN (GLUCOPHAGE) 1000 MG TABLET    Take 1 tablet by mouth 2 times daily (with meals)    NAPROXEN (NAPROSYN) 500 MG TABLET    Take 1 tablet by mouth 2 times daily as needed for Pain ((take with food))    PREGABALIN (LYRICA) 150 MG CAPSULE    Take 1 capsule by mouth 3 times daily    TRAZODONE (DESYREL) 100 MG TABLET    Take 1 tablet by mouth nightly     No Known Allergies     Physical Exam       ED Triage Vitals [05/29/17 1020]   BP Temp Temp Source Pulse Resp SpO2 Height Weight   130/73 98.5 F (36.9 C) Oral 93 16 97 % 5' 11" (1.803 m) 165 lb (74.8 kg)     Physical Exam  General: Labs reviewed, nontoxic appearing  HEENT: Conjunctiva clear, moist mucous membranes  Cardiac: Regular heart rate and rhythm with no murmurs or tachycardia appreciated on auscultation  Pulmonary: No respiratory distress and lungs are clear  Abdomen: Soft and nontender  Extremities: Right lower extremity tender to touch, soft compartments, neurovascularly intact distally  Skin: Non-circumferential right lower extremity swelling from the knee to foot.  2.5 cm abscess to the anterior aspects of mid right lower leg.  Neurologic: Alert, no  focal findings  Psychiatric: Cooperative and appropriate  exam otherwise unremarkable    Diagnostics   Labs:  Results for orders placed or performed during the hospital encounter of 29/52/84   Basic Metabolic Panel   Result Value Ref Range    Sodium 136 (L) 137 - 145 mmol/L    Potassium 5.1 3.5 - 5.1 mmol/L    Chloride 101 98 - 107 mmol/L    CO2 27 22 - 30 mmol/L    Anion Gap 7 NA    Glucose 110 (H) 70 - 100 mg/dL    BUN 13 7 - 20 mg/dL    CREATININE 0.53 0.52 - 1.25 mg/dL    eGFR African American >60.0 >60 mL/min    EGFR IF NonAfrican American >60.0 >60 mL/min    Calcium 8.9 8.4 - 10.4 mg/dL   Hemogram  (CBC) w/Auto Diff   Result Value Ref Range    WBC 8.9 3.6 - 10.7 10*3/uL    RBC 4.58 4.40 - 5.90 10*6/uL    Hemoglobin 13.1 13.0 - 18.0 g/dL    Hematocrit 38.5 (L) 40.0 - 52.0 %    MCV 84.0 80.0 - 98.0 fL    MCH 28.7 26.0 - 34.0 pg    MCHC 34.1 32.0 - 36.0 %    RDW 14.5 11.5 - 14.5 %    Platelets 250 140 - 440 10*3/uL    MPV 7.1 (L) 7.4 - 10.4 fL    Granulocytes % 73.2 40.0 - 80.0 %    Lymphocyte % 18.6 (L) 20.0 - 40.0 %    Monocytes 7.5 2.0 - 10.0 %    Eosinophils 0.0 (L) 1.0 - 6.0 %    Basophils 0.7 0.0 - 2.0 %    Absolute Neut # 6.5 1.8 - 7.0 10*3/uL    Absolute Lymph # 1.7 1.0 - 4.3 10*3/uL    Absolute Mono # 0.7 0.0 - 0.8 10*3/uL    Absolute Eos # 0.0 0.0 - 0.5 10*3/uL    Absolute Baso # 0.1 0.0 - 0.2 10*3/uL  Radiographs:  Xr Tibia Fibula Right (2 Views)    Result Date: 05/29/2017  Patient Name:  Mason Garza, Mason Garza MRN:  [id number] FIN:  [id number] ---Diagnostic Radiology--- Exam Date/Time        05/29/2017 11:05:02 EDT                             Exam                  CR Tibia/Fibula 2 Views Right                       Ordering Physician    Jerick Khachatryan, CNP, Jeannett Dekoning L.                         Accession Number      [id number]                                       CPT4 Codes 73710 () Reason For Exam pain Report Examination: Right tibia fibula Clinical Indication: Pain, redness, swelling Comparison: None  Findings: AP and 3 lateral views of the right tibia and fibula demonstrate no cortical or trabecular irregularity to suggest a fracture. Bones are grossly normal anatomic alignment. Soft tissues demonstrate a minimal amount of soft tissue swelling. No subcutaneous air or radiopaque foreign body. Impression: No evidence of fracture, subluxation, or other gross abnormality of the right tibia and fibula. Report Dictated on Workstation: [id number] ---** Final ---** Dictated: 05/29/2017 11:22 am Dictating Physician: Loree Fee, MD, Gust Rung Signed Date and Time: 05/29/2017 11:23 am Signed by: Loree Fee, MD, Gust Rung Transcribed Date and Time: 05/29/2017 11:22      Procedures:   Patient gives verbal consent to have increased drainage.  Site is cleansed with Betadine and locally anesthetized using 5 mL 1% lidocaine.  11 blade used to make a 1 inch linear incision.  Purulent drainage expressed.  Irrigated with normal saline.  Tolerated well.    EKG: All EKG's are interpreted by the Emergency Department Physician in the absence of a cardiologist. Please see their note for interpretation of EKG.    ED Course and MDM           In brief, Mason Garza is a 51 y.o. male who presented to the emergency department As described above.  IV access obtained and he is given vancomycin, IV fluids, and pain addressed with morphine and Zofran.  X-ray reveals no osteomyelitis or acute findings.  Labs within acceptable limits.  Patient agreeable with admission for IV antibiotics.  I spoke with Dr. Ardelia Mems as well as care coordinator patient does meet inpatient criteria given the amount of cellulitis of the right lower extremity.    ED Medication Orders     Start Ordered     Status Ordering Provider    05/29/17 1100 05/29/17 1045  vancomycin (VANCOCIN) 1000 mg in dextrose 5% 200 mL IVPB  ONCE      Last MAR action:  New Bag - by Burnell Blanks on 05/29/17 at 1144 Lillee Mooneyhan    05/29/17 1100 05/29/17 1045  sodium chloride  flush 0.9 % injection 3 mL  EVERY 8 HOURS      Acknowledged Rexann Lueras    05/29/17 1100 05/29/17 1045  0.9 % sodium chloride infusion  CONTINUOUS  Last MAR action:  New Bag - by Burnell Blanks on 05/29/17 at 1145 Easten Maceachern    05/29/17 1100 05/29/17 1045  0.9 % sodium chloride bolus  ONCE      Last MAR action:  New Bag - by Burnell Blanks on 05/29/17 at 1141 Autumne Kallio    05/29/17 1100 05/29/17 1045  ondansetron (ZOFRAN) injection 4 mg  ONCE      Last MAR action:  Given - by MOOK, COURTNEY on 05/29/17 at 1141 Soua Lenk    05/29/17 1045 05/29/17 1045  lidocaine 2 % injection 20 mL  ONCE      Acknowledged Khali Perella    05/29/17 1044 05/29/17 1045  morphine injection 4 mg  EVERY 15 MIN PRN      Last MAR action:  Given - by Burnell Blanks on 05/29/17 at 1236 Ramil Edgington          Final Impression    No diagnosis found.    DISPOSITION Admitted 05/29/2017 01:46:19 PM     (Please note that portions of this note may have been completed with a voice recognition program. Efforts were made to edit the dictations but occasionally words are mis-transcribed.)    Janean Sark, APRN - CNP  [employer]     Janean Sark, APRN - CNP  05/29/17 1355

## 2017-05-29 NOTE — H&P (Signed)
[facility] Initial History and Physical       Mason Garza  DOB:  [birth year](51 y.o.)  MRN:  [id number]    Date: 05/29/17    Subjective:      Chief Complaint: Leg Lump     HPI   Patient reports over the past few days he noticed right lump swelling and redness.  He underwent an I and D in ER and is feeling much better.  +feverish, chills.  Denies CP, dyspnea, abdominal pain, N/V/D/C.    Past Medical History:   Diagnosis Date   . Chronic hepatitis C ([place]) 11/22/2015    active IVDU   . H/O echocardiogram 05/12/11    EF62%, mild LAE   . Neuropathy    . Not immune to hepatitis B virus     need to re-vaccinate   . Type II or unspecified type diabetes mellitus without mention of complication, not stated as uncontrolled        History reviewed. No pertinent surgical history.    History reviewed. No pertinent family history.    Social History     Social History   . Marital status: Divorced     Spouse name: N/A   . Number of children: N/A   . Years of education: N/A     Occupational History   . Not on file.     Social History Main Topics   . Smoking status: Current Every Day Smoker     Packs/day: 1.00     Years: 30.00   . Smokeless tobacco: Current User     Types: Chew   . Alcohol use 0.0 oz/week      Comment: 6 pack per yr now; former 6-12 pk per day for 10 yrs   . Drug use: Yes     Frequency: 10.0 times per week     Types: Opiates       Comment: heroin IVDU daily   . Sexual activity: Not on file     Other Topics Concern   . Not on file     Social History Narrative    Living with mother now - improved meal intake and assistance with DM care as mother is a former Therapist, sports       No Known Allergies    Prior to Admission medications    Medication Sig Start Date End Date Taking? Authorizing Provider   naproxen (NAPROSYN) 500 MG tablet Take 1 tablet by mouth 2 times daily as needed for Pain ((take with food)) 04/22/16   Omelia Blackwater, MD   traZODone (DESYREL) 100 MG tablet Take 1 tablet by mouth nightly 09/28/15   Omelia Blackwater, MD   glucose blood VI test strips [facility] BLOOD GLUCOSE TEST) strip 1 each by In Vitro route daily As needed. 09/28/15   Omelia Blackwater, MD   metFORMIN (GLUCOPHAGE) 1000 MG tablet Take 1 tablet by mouth 2 times daily (with meals) 09/28/15   Omelia Blackwater, MD   buPROPion (WELLBUTRIN XL) 150 MG extended release tablet Take 1 tablet by mouth every morning 09/28/15   Omelia Blackwater, MD   glucose monitoring kit (FREESTYLE) monitoring kit 1 kit by Does not apply route daily as needed (test blood sugar as needed) May have any glucometer that is covered by patient's insurance. 03/19/15   Omelia Blackwater, MD   Lancets MISC Check blood sugars daily or when symptoms develop. 05/02/14   Omelia Blackwater, MD   Lancet Devices (B-D LANCET DEVICE) MISC Check blood  sugars daily or when symptoms develop. 05/02/14   Omelia Blackwater, MD     Review of Systems - all other ROS negative    Objective:        Vitals:    05/29/17 1020 05/29/17 1417 05/29/17 1604   BP: 130/73 118/72 105/66   Pulse: 93 80 75   Resp: 16 16 16    Temp: 98.5 F (36.9 C) 98.2 F (36.8 C) 97.4 F (36.3 C)   TempSrc: Oral Oral Temporal   SpO2: 97% 100% 97%   Weight: 165 lb (74.8 kg)     Height: 5' 11"  (1.803 m)       Physical Exam   Constitutional: He is oriented to person, place, and time.   Thin, eyes mostly closed but answers questions (does not appear to want to be here)   HENT:   Head: Normocephalic and atraumatic.   Nose: Nose normal.   Mouth/Throat: Oropharynx is clear and moist.   Eyes: Pupils are equal, round, and reactive to light. Conjunctivae and EOM are normal.   Neck: Neck supple. No thyromegaly present.   Cardiovascular: Normal rate, regular rhythm, normal heart sounds and intact distal pulses.    Pulmonary/Chest: Effort normal and breath sounds normal.   Abdominal: Soft. Bowel sounds are normal.   No hepatosplenomegaly.   Musculoskeletal: He exhibits no edema or tenderness.   Right lower leg wrapped in bandage; redness still present but resolving   Neurological: He is  alert and oriented to person, place, and time. He has normal reflexes.   Skin: Skin is warm and dry.   Psychiatric: He has a normal mood and affect. Judgment normal.     Lab Results   Component Value Date    NA 136 (L) 05/29/2017    K 5.1 05/29/2017    CL 101 05/29/2017    CO2 27 05/29/2017    BUN 13 05/29/2017    CREATININE 0.53 05/29/2017    GLUCOSE 110 (H) 05/29/2017    CALCIUM 8.9 05/29/2017    PROT 7.6 05/16/2017    LABALBU 4.2 05/16/2017    BILITOT 0.5 05/16/2017    ALKPHOS 115 05/16/2017    AST 34 05/16/2017    ALT 37 05/16/2017    LABGLOM >60 04/27/2014    GFRAA >60 04/27/2014    AGRATIO 1.0 04/27/2014    GLOB 3.4 04/27/2014       Lab Results   Component Value Date    WBC 8.9 05/29/2017    HGB 13.1 05/29/2017    HCT 38.5 (L) 05/29/2017    MCV 84.0 05/29/2017    PLT 250 05/29/2017     Additional results since admission have been reviewed.    Assessment and Plan:      Active Problems:    Cellulitis and abscess of right lower extremity  Resolved Problems:    * No resolved hospital problems. *      1. Right leg abscess - s/p I and D, on vanco and unasyn  2. DM II - per patient diet-controlled  3. Chronic hep C  4. Heroin abuse - patient interested in quitting    Disposition: await test results, await consultant recommendations and awaitclinical improvement    I spent over 51% of total time providing counseling or in coordination of care: > NA   Ipersonally examined the patient and I personally reviewed chart, data, labs radiology reports    7AM-5PM please page:   Electronically signed by Imelda Pillow, DO on 05/29/17 at 7:38  PM    5PM-7AM please page:  [facility]

## 2017-05-29 NOTE — ED Provider Notes (Signed)
I Supervised the Medical evaluation and treatment on Mason Garza.   All diagnostic, treatment, and disposition decisions were made by myself in conjunction with the mid-level provider. The patients complaint was abscess. Patient's had RIGHT lower leg pain and swelling for the past 3 days. Patient has a mid RIGHT lower leg abscess with surrounding cellulitis above and below down on the top of the foot. There is a typical MRSA appearance with swelling, pain in the bruises appearance to the abscess area..    Patient was given IV vancomycin.      Patient had I&D of the abscess and was admitted to the hospital for further IV antibiotics for his cellulitis.    Pain management is going to be difficult this patient has chronic active ongoing 10+ times a week IV  heroin abuse    For further details of Mason Garza emergency department encounter, please see NP documentation.        Monna Fam, MD  05/29/17 1353

## 2017-05-29 NOTE — ED Notes (Signed)
DSD applied to I & D site     Henrene Pastor, RN  05/29/17 1425

## 2017-05-29 NOTE — Consults (Signed)
Pharmacy Managed Vancomycin Dosing Service Consult Note  Consult Date: 05/29/17 Consulted By: Dr. Ardeth Sportsman  Room:1449/1449B  Patient Name: Mason Garza  MRN: [id number]     Allergies: Patient has no known allergies.  Age: 51 y.o. Sex: male   Ht: Height:  (180.3 cm)  TBW: Weight: 165 lb (74.8 kg)   BMI: Body mass index is 23.01 kg/m.  DW: 74.8  Calculated CrCl: 176 mL/min    Lab Results   Component Value Date    CREATININE 0.53 05/29/2017    CREATININE 0.58 05/16/2017    CREATININE 0.87 11/28/2015    BUN 13 05/29/2017    BUN 15 05/16/2017    BUN 14 11/28/2015    WBC 8.9 05/29/2017    WBC 10.5 05/16/2017    WBC 9.8 11/28/2015       Infectious Diagnosis: Cellulitis (goal trough = 10-15 mcg/mL)  Antimicrobials:  Recent Abx Admin                   vancomycin (VANCOCIN) 1000 mg in dextrose 5% 200 mL IVPB (mg) 1,000 mg New Bag 05/29/17 1144                Assessment/Plan:  Start Vancomycin 1.5 grams Q 12 hours based on patient age, weight, renal function, and infectious diagnosis (20 mg/kg). Will assess trough prior to 4th dose and adjust as appropriate. Will follow renal function closely.    Thank you for this consult. Please page/call with questions.  Date: 05/29/17 Time: 4:54 PM      _______________________________________  Name: Juliene Pina, RPh  Pager:   Phone: [phone]

## 2017-05-29 NOTE — Progress Notes (Signed)
Wound is located on pts RIGHT LE. Pt also a one single crutch which he brought with him to unit.

## 2017-05-29 NOTE — ED Notes (Addendum)
2 unsuccessful IV attempts in pts L AC and L hand. Blood work obtained and sent to lab. Additional RN to bedside to attempt IV             Henrene Pastor, RN  05/29/17 1124

## 2017-05-29 NOTE — Plan of Care (Signed)
Problem: Skin Integrity:  Goal: Demonstration of wound healing without infection will improve  Demonstration of wound healing without infection will improve   Outcome: Ongoing

## 2017-05-29 NOTE — Plan of Care (Signed)
Problem: Skin Integrity:  Goal: Demonstration of wound healing without infection will improve  Demonstration of wound healing without infection will improve  Outcome: Ongoing

## 2017-05-30 LAB — POCT GLUCOSE: POC Glucose: 178 mg/dL — ABNORMAL HIGH (ref 70–100)

## 2017-05-30 MED ORDER — HYDROXYZINE HCL 50 MG/ML IM SOLN
50 MG/ML | INTRAMUSCULAR | Status: DC | PRN
Start: 2017-05-30 — End: 2017-05-31

## 2017-05-30 MED ORDER — IBUPROFEN 400 MG PO TABS
400 MG | Freq: Four times a day (QID) | ORAL | Status: DC | PRN
Start: 2017-05-30 — End: 2017-05-31

## 2017-05-30 MED ORDER — TRAMADOL HCL 50 MG PO TABS
50 MG | ORAL | Status: AC
Start: 2017-05-30 — End: 2017-05-31
  Administered 2017-05-30 (×4): 100 mg via ORAL

## 2017-05-30 MED ORDER — POLYETHYLENE GLYCOL 3350 17 G PO PACK
17 g | Freq: Every day | ORAL | Status: DC | PRN
Start: 2017-05-30 — End: 2017-05-31

## 2017-05-30 MED ORDER — LOPERAMIDE HCL 2 MG PO CAPS
2 MG | Freq: Three times a day (TID) | ORAL | Status: DC | PRN
Start: 2017-05-30 — End: 2017-05-31

## 2017-05-30 MED ORDER — CLONIDINE HCL 0.1 MG PO TABS
0.1 MG | Freq: Three times a day (TID) | ORAL | Status: DC
Start: 2017-05-30 — End: 2017-05-31
  Administered 2017-05-30 – 2017-05-31 (×4): 0.1 mg via ORAL

## 2017-05-30 MED ORDER — THERAPEUTIC MULTIVIT/MINERAL PO TABS
Freq: Every day | ORAL | Status: DC
Start: 2017-05-30 — End: 2017-05-31
  Administered 2017-05-30 – 2017-05-31 (×2): 1 via ORAL

## 2017-05-30 MED ORDER — IPRATROPIUM-ALBUTEROL 0.5-2.5 (3) MG/3ML IN SOLN
RESPIRATORY_TRACT | Status: DC | PRN
Start: 2017-05-30 — End: 2017-05-31

## 2017-05-30 MED ORDER — MELATONIN 3 MG PO TABS
3 MG | Freq: Every evening | ORAL | Status: DC | PRN
Start: 2017-05-30 — End: 2017-05-31

## 2017-05-30 MED ORDER — LABETALOL HCL 5 MG/ML IV SOLN
5 MG/ML | INTRAVENOUS | Status: DC | PRN
Start: 2017-05-30 — End: 2017-05-31

## 2017-05-30 MED ORDER — METHOCARBAMOL 750 MG PO TABS
750 MG | Freq: Four times a day (QID) | ORAL | Status: DC | PRN
Start: 2017-05-30 — End: 2017-05-31
  Administered 2017-05-30: 1500 mg via ORAL

## 2017-05-30 MED ORDER — TRAMADOL HCL 50 MG PO TABS
50 MG | Freq: Four times a day (QID) | ORAL | Status: DC
Start: 2017-05-30 — End: 2017-05-31
  Administered 2017-05-31 (×2): 100 mg via ORAL

## 2017-05-30 MED ORDER — ACETAMINOPHEN 500 MG PO TABS
500 MG | Freq: Three times a day (TID) | ORAL | Status: DC
Start: 2017-05-30 — End: 2017-05-31
  Administered 2017-05-30 – 2017-05-31 (×4): 1000 mg via ORAL

## 2017-05-30 MED ORDER — TRAMADOL HCL 50 MG PO TABS
50 MG | Freq: Three times a day (TID) | ORAL | Status: DC
Start: 2017-05-30 — End: 2017-05-31

## 2017-05-30 MED ORDER — PROMETHAZINE HCL 25 MG/ML IJ SOLN
25 MG/ML | Freq: Four times a day (QID) | INTRAMUSCULAR | Status: DC | PRN
Start: 2017-05-30 — End: 2017-05-31

## 2017-05-30 MED ORDER — ALUM & MAG HYDROXIDE-SIMETH 200-200-20 MG/5ML PO SUSP
200-200-20 MG/5ML | Freq: Two times a day (BID) | ORAL | Status: DC | PRN
Start: 2017-05-30 — End: 2017-05-31

## 2017-05-30 MED ORDER — TRAZODONE HCL 50 MG PO TABS
50 MG | Freq: Every evening | ORAL | Status: DC | PRN
Start: 2017-05-30 — End: 2017-05-31

## 2017-05-30 MED ORDER — PROMETHAZINE HCL 25 MG PO TABS
25 MG | Freq: Four times a day (QID) | ORAL | Status: DC | PRN
Start: 2017-05-30 — End: 2017-05-31

## 2017-05-30 MED ORDER — DICYCLOMINE HCL 20 MG PO TABS
20 MG | Freq: Four times a day (QID) | ORAL | Status: DC | PRN
Start: 2017-05-30 — End: 2017-05-31

## 2017-05-30 MED FILL — TRAMADOL HCL 50 MG PO TABS: 50 MG | ORAL | Qty: 2

## 2017-05-30 MED FILL — IBUPROFEN 400 MG PO TABS: 400 MG | ORAL | Qty: 1

## 2017-05-30 MED FILL — LOPERAMIDE HCL 2 MG PO CAPS: 2 MG | ORAL | Qty: 1

## 2017-05-30 MED FILL — HYDROXYZINE HCL 50 MG/ML IM SOLN: 50 MG/ML | INTRAMUSCULAR | Qty: 1

## 2017-05-30 NOTE — Progress Notes (Signed)
At 2200 patient noted to be hard to wake up. Friends in room said they didn't know why he was so sleepy. Male friend stated " all he had was a chocolate bar and a drink". Asked the friends if he took any medications "male friend stated "No he didn't take anything." The two friends preceded to run out of the room.  Patient's pox 66. Pt put on O2 mask. Pox went up to 95%. Joe from [facility] called and Dr. Linus Galas. Patient given Narcan by Gabriel Rung. Pt straight cathed for urine specimen. Pt awoken and pox wnl. Pt switched to nasal canula 2 liters pox 96%.

## 2017-05-30 NOTE — Consults (Signed)
CHEMICAL DEPENDENCY CONSULTATION      Admit Date: 05/29/2017  Primary Care Physician: Omelia Blackwater, MD  MRN: [[id number]]     Chief Complaint   Patient presents with   . Abscess     Right lower leg. x 3 days.        History of Present Illness   Mason Garza is a 51 y.o. 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 Uses meth and weed rarely , smokes 1 ppd since he was 53, does not drink alcohol, very rare occasionsl when he used couple of klonipine has done that for long time.  Denies any seizures, no DTs, has blackouts, many overdoses last one  A month ago. Legal issues due to child support.  Denies suicidal or homicidal attempts in the past or currently. No childhood trauma no ptsd, no hospitalization due to psych issues.    Mason Garza presented [facility] the ED at [facility] with RIGHT lower leg pain and swelling for the past 3 days. Patient has a mid RIGHT lower leg abscess with surrounding cellulitis above and below down on the top of the foot. There is a typical MRSA appearance with swelling, pain in the bruises appearance to the abscess area, Patient was given IV vancomycin, Patient had I&D of the abscess and was admitted to the hospital for further IV antibiotics for his cellulitis.    Last use was a day before coming in the hospital     Has been in outpatient treatment for drug rehab severla years ago does not remember the place though , denies any MAT.  Substance Use Disorder Criteria  1. Taking substance in larger amounts and/or for longer than intended[x]   2. Wanting to cut down or quit substance use but not being able to[x]   3. Spending a lot of time obtaining the substance[x]   4. Craving or a strong desire to use substance[x]   5. Repeatedly doesn't carry out major obligations due to substance use[x]   6. Using despite recurring social or interpersonal problems caused by substance use[x]   7. Reducing social, occupational, or recreational activities  due to substance use[x]   8. Recurrent use of substance in physically hazardous situations[x]   9. Consistent use of substance despite recurrent physical or psychological difficulties[x]   10. Tolerance (increased amounts to achieve intoxication or diminished effect)[x]   11. Withdrawal syndrome or the substance is used to avoid withdrawal[x]     2-3 = mild; 4-5 = moderate; 6 or >6 = severe substance use disorder       Remaining History     Past Medical History:   Diagnosis Date   . Chronic hepatitis C ([place]) 11/22/2015    active IVDU   . H/O echocardiogram 05/12/11    EF62%, mild LAE   . Neuropathy    . Not immune to hepatitis B virus     need to re-vaccinate   . Type II or unspecified type diabetes mellitus without mention of complication, not stated as uncontrolled      History reviewed. No pertinent surgical history.  History reviewed. No pertinent family history.  Social History     Social History   . Marital status: Divorced     Spouse name: N/A   . Number of children: N/A   . Years of education: N/A     Occupational History   . Not on file.     Social History Main Topics   .  Smoking status: Current Every Day Smoker     Packs/day: 1.00     Years: 30.00   . Smokeless tobacco: Current User     Types: Chew   . Alcohol use 0.0 oz/week      Comment: 6 pack per yr now; former 6-12 pk per day for 10 yrs   . Drug use: Yes     Frequency: 10.0 times per week     Types: Opiates       Comment: heroin IVDU daily   . Sexual activity: Not on file     Other Topics Concern   . Not on file     Social History Narrative    Living with mother now - improved meal intake and assistance with DM care as mother is a former Therapist, sports     Prescriptions Prior to Admission: naproxen (NAPROSYN) 500 MG tablet, Take 1 tablet by mouth 2 times daily as needed for Pain ((take with food))  [DISCONTINUED] pregabalin (LYRICA) 150 MG capsule, Take 1 capsule by mouth 3 times daily  traZODone (DESYREL) 100 MG tablet, Take 1 tablet by mouth nightly  glucose blood VI  test strips (ACURA BLOOD GLUCOSE TEST) strip, 1 each by In Vitro route daily As needed.  metFORMIN (GLUCOPHAGE) 1000 MG tablet, Take 1 tablet by mouth 2 times daily (with meals)  buPROPion (WELLBUTRIN XL) 150 MG extended release tablet, Take 1 tablet by mouth every morning  glucose monitoring kit (FREESTYLE) monitoring kit, 1 kit by Does not apply route daily as needed (test blood sugar as needed) May have any glucometer that is covered by patient's insurance.  Lancets MISC, Check blood sugars daily or when symptoms develop.  Lancet Devices (B-D LANCET DEVICE) MISC, Check blood sugars daily or when symptoms develop.    Allergies: Patient has no known allergies.    Review of Systems   10 system ROS was completed.  Pertinent positives and negatives are listed below:    Positive for The patient is complaining of restlessness, irritability, hot/cold flashes, sweats, chills, arthralgias, myalgias, runny nose and watery eyes nausea and abdominal cramps    Negative for hallucination suicidal or homicidal ideation.    Physical Exam     Vitals:    05/29/17 1020 05/29/17 1417 05/29/17 1604 05/30/17 0714   BP: 130/73 118/72 105/66 121/65   Pulse: 93 80 75 67   Resp: 16 16 16 18    Temp: 98.5 F (36.9 C) 98.2 F (36.8 C) 97.4 F (36.3 C) 97.4 F (36.3 C)   TempSrc: Oral Oral Temporal Temporal   SpO2: 97% 100% 97% 97%   Weight: 165 lb (74.8 kg)      Height: 5' 11"  (1.803 m)        General appearance: Cooperative, no distress, appears stated age, non-toxic.  Psychiatric:  Alert and oriented to person, place, and time. Insight: fair . Judgment: fair . Mood: anxious and irritable. Affect: congruent with mood.     Diaphoresis: 2/10  Restlessness: 0/10  Palmar Erythema 0/10  Tongue Fasciculations: 0/10  Extremity Tremors: 0/10    Imaging/Labs/Meds   Xr Tibia Fibula Right (2 Views)    Result Date: 05/29/2017  Patient Name:  Mason Garza MRN:  [id number] FIN:  [id number] ---Diagnostic Radiology--- Exam Date/Time         05/29/2017 11:05:02 EDT                             Exam  CR Tibia/Fibula 2 Views Right                       Ordering Physician    ENGLEHART, CNP, JENNIFER L.                         Accession Number      [id number]                                       CPT4 Codes 38101 () Reason For Exam pain Report Examination: Right tibia fibula Clinical Indication: Pain, redness, swelling Comparison: None Findings: AP and 3 lateral views of the right tibia and fibula demonstrate no cortical or trabecular irregularity to suggest a fracture. Bones are grossly normal anatomic alignment. Soft tissues demonstrate a minimal amount of soft tissue swelling. No subcutaneous air or radiopaque foreign body. Impression: No evidence of fracture, subluxation, or other gross abnormality of the right tibia and fibula. Report Dictated on Workstation: [id number] ---** Final ---** Dictated: 05/29/2017 11:22 am Dictating Physician: Loree Fee, MD, Gust Rung Signed Date and Time: 05/29/2017 11:23 am Signed by: Loree Fee, MD, Gust Rung Transcribed Date and Time: 05/29/2017 11:22    Xr Chest Portable    Result Date: 05/16/2017  Patient Name:  SABASTIEN, TYLER MRN:  [id number] FIN:  [id number] ---Diagnostic Radiology--- Exam Date/Time        05/16/2017 05:06:19 EDT                             Exam                  CR Chest Portable                                   Ordering Physician    Bary Richard                              Accession Number      [id number]                                       CPT4 Codes 71245 () Reason For Exam chest pain Report PORTABLE CHEST: INDICATION: Chest pain COMPARISON:  11/28/2015 Obtained at 0450 hours. A single portable AP radiograph of the chest was obtained.  The heart is normal in size.  The mediastinal silhouette is normal.  Chronic interstitial changes are present.  There are no effusions or infiltrates. There is no pleural thickening.  Arthritic changes of the spine are present.       IMPRESSION:  Chronic changes. No acute process. Report Dictated on Workstation: [id number] ---** Final ---** Dictated: 05/16/2017 5:20 am Dictating Physician: Coralee Pesa Signed Date and Time: 05/16/2017 5:21 am Signed by: Roselee Nova, ALFRED Transcribed Date and Time: 05/16/2017 5:20    Ct Abdomen Pelvis W Contrast    Result Date: 05/16/2017  Patient Name:  TERREN, JANDREAU MRN:  [id number] FIN:  [id number] ---CT--- Exam Date/Time        05/16/2017 07:57:24 EDT  Exam                  CT Abdomen/Pelvis w/ IV Contrast (IV Onl             Ordering Physician    [phone] [facility], DAVID                               Accession Number      [id number]                                        CPT4 Codes [phone] (CT Abdomen/Pelvis w/ IV Contrast (IV Onl),  [id number] () Reason For Exam abdominal pain, tachycardia, bandemia Report CT scan of the abdomen: 05/16/2017. CT scan of the pelvis: 05/16/2017. Clinical Information: Abdominal pain. CT scans of the abdomen: CT scans of the abdomen were performed at 3 mm slice thickness following the administration of intravenous contrast. No prior studies for comparison. No oral contrast was given per request. Without the administration of oral contrast, evaluation of hollow organs is limited. Limited slices through the lower lung fields reveal no pleural or parenchymal abnormalities in the lung bases. The liver is normal in size and configuration. No mass lesions identified within the liver. There is no evidence of intrahepatic biliary dilatation. The spleen is not enlarged. The pancreas and kidneys are within normal limits. No peripancreatic fluid collections or hydronephrosis are identified. No free peritoneal fluid or air is seen. No retroperitoneal lymphadenopathy is identified.  The gallbladder is mildly distended. CT scans of the pelvis: CT scans of the pelvis were performed at 3 mm slice thickness with intravenous contrast enhancement from the  abdominal portion of the study. No abnormal loops of bowel are identified. No inflammatory changes in the mesentery are seen. The appendix was isolated as a separate structure and appears unremarkable.  No pelvic mass lesions, fluid collections or lymphadenopathy is seen. Impression: Study limited due to lack of oral contrast. Mild gallbladder distention. No other abnormalities identified.  Report Dictated on Workstation: [id number] ---** Final ---** Dictated: 05/16/2017 8:38 am Dictating Physician: Idolina Primer, MD, RISA Signed Date and Time: 05/16/2017 8:41 am Signed by: Idolina Primer, MD, RISA Transcribed Date and Time: 05/16/2017 8:38    Recent Results (from the past 48 hour(s))   Basic Metabolic Panel    Collection Time: 05/29/17 11:21 AM   Result Value Ref Range    Sodium 136 (L) 137 - 145 mmol/L    Potassium 5.1 3.5 - 5.1 mmol/L    Chloride 101 98 - 107 mmol/L    CO2 27 22 - 30 mmol/L    Anion Gap 7 NA    Glucose 110 (H) 70 - 100 mg/dL    BUN 13 7 - 20 mg/dL    CREATININE 0.53 0.52 - 1.25 mg/dL    eGFR African American >60.0 >60 mL/min    EGFR IF NonAfrican American >60.0 >60 mL/min    Calcium 8.9 8.4 - 10.4 mg/dL   Hemogram  (CBC) w/Auto Diff    Collection Time: 05/29/17 11:21 AM   Result Value Ref Range    WBC 8.9 3.6 - 10.7 10*3/uL    RBC 4.58 4.40 - 5.90 10*6/uL    Hemoglobin 13.1 13.0 - 18.0 g/dL    Hematocrit 38.5 (L) 40.0 - 52.0 %    MCV 84.0 80.0 - 98.0 fL  MCH 28.7 26.0 - 34.0 pg    MCHC 34.1 32.0 - 36.0 %    RDW 14.5 11.5 - 14.5 %    Platelets 250 140 - 440 10*3/uL    MPV 7.1 (L) 7.4 - 10.4 fL    Granulocytes % 73.2 40.0 - 80.0 %    Lymphocyte % 18.6 (L) 20.0 - 40.0 %    Monocytes 7.5 2.0 - 10.0 %    Eosinophils 0.0 (L) 1.0 - 6.0 %    Basophils 0.7 0.0 - 2.0 %    Absolute Neut # 6.5 1.8 - 7.0 10*3/uL    Absolute Lymph # 1.7 1.0 - 4.3 10*3/uL    Absolute Mono # 0.7 0.0 - 0.8 10*3/uL    Absolute Eos # 0.0 0.0 - 0.5 10*3/uL    Absolute Baso # 0.1 0.0 - 0.2 10*3/uL   POCT Glucose    Collection Time: 05/29/17  6:39 PM    Result Value Ref Range    POC Glucose 86 70 - 100 mg/dL     . therapeutic multivitamin-minerals  1 tablet Oral Daily   . traMADol  100 mg Oral Q4H    Followed by   . [START ON 05/31/2017] traMADol  100 mg Oral Q6H    Followed by   . [START ON 06/01/2017] traMADol  100 mg Oral Q8H   . sodium chloride flush  3 mL Intravenous Q8H   . naproxen  500 mg Oral BID WC   . traZODone  100 mg Oral Nightly   . sodium chloride flush  10 mL Intravenous 2 times per day   . enoxaparin  40 mg Subcutaneous Daily   . ampicillin-sulbactam  3 g Intravenous Q6H   . vancomycin  20 mg/kg Intravenous Q12H   . acetaminophen  1,000 mg Oral 3 times per day     aluminum & magnesium hydroxide-simethicone, loperamide, hydrOXYzine, sodium chloride flush, melatonin, labetalol, ipratropium-albuterol, polyethylene glycol, promethazine    Active Hospital Problems    Diagnosis Date Noted   . Cellulitis and abscess of right lower extremity [L03.115, L02.415] 05/29/2017       Plan   1. Opioid use d/o with withdrawal  The patient was started on tramadol 72 hours taper, he is still going through significant withdrawal , probably he is using more amount that he admits, will add clonidine 0.1 mg tid for  Now   and prn medications for symptomology.     2. Patient will benefits from inpatient rehab more than any other form, discussed it with him and he agrees, will consult social worker for referral to inpatient rehab.    Theador Hawthorne, MD  Addiction Medicine  05/30/2017 at 11:42 AM

## 2017-05-30 NOTE — Progress Notes (Signed)
Pt respirations 8. Dr. Linus Galas paged. New order for 0.4mg  narcan x 1 now.  Joe from [facility] was also notified.

## 2017-05-30 NOTE — Progress Notes (Signed)
[name] Progress Note        Mason Garza  DOB:  [birth year](51 y.o.)  MRN:  [id number]    Date: 05/30/17   Subjective:    HPI  Patient states that he is doing better this AM.  He reports mild discomfort over the abscess.  He states redness is much improved.  Denies fever, chills, CP, dyspnea, abdominal pain, N/V/D/C.    Scheduled Meds:  . therapeutic multivitamin-minerals  1 tablet Oral Daily   . traMADol  100 mg Oral Q4H    Followed by   . [START ON 05/31/2017] traMADol  100 mg Oral Q6H    Followed by   . [START ON 06/01/2017] traMADol  100 mg Oral Q8H   . cloNIDine  0.1 mg Oral TID   . sodium chloride flush  3 mL Intravenous Q8H   . naproxen  500 mg Oral BID WC   . traZODone  100 mg Oral Nightly   . sodium chloride flush  10 mL Intravenous 2 times per day   . enoxaparin  40 mg Subcutaneous Daily   . ampicillin-sulbactam  3 g Intravenous Q6H   . vancomycin  20 mg/kg Intravenous Q12H   . acetaminophen  1,000 mg Oral 3 times per day     Continuous Infusions:  . sodium chloride 125 mL/hr at 05/29/17 1145     PRN Meds:aluminum & magnesium hydroxide-simethicone, loperamide, hydrOXYzine, methocarbamol, dicyclomine, traZODone, ibuprofen, promethazine, sodium chloride flush, melatonin, labetalol, ipratropium-albuterol, polyethylene glycol, promethazine    Review of Systems - all other ROS negative    Interval Pertinent History:  Social History   Substance Use Topics   . Smoking status: Current Every Day Smoker     Packs/day: 1.00     Years: 30.00   . Smokeless tobacco: Current User     Types: Chew   . Alcohol use 0.0 oz/week      Comment: 6 pack per yr now; former 6-12 pk per day for 10 yrs       Objective:     Patient Vitals for the past 24 hrs:   BP Temp Temp src Pulse Resp SpO2   05/30/17 0714 121/65 97.4 F (36.3 C) Temporal 67 18 97 %   05/29/17 1604 105/66 97.4 F (36.3 C) Temporal 75 16 97 %   05/29/17 1417 118/72 98.2 F (36.8 C) Oral 80 16 100 %       Average, Min, and Max for last 24 hours  Vitals:  TEMPERATURE:  Temp  Avg: 97.7 F (36.5 C)  Min: 97.4 F (36.3 C)  Max: 98.2 F (36.8 C)    RESPIRATIONS RANGE: Resp  Avg: 16.7  Min: 16  Max: 18    PULSE RANGE: Pulse  Avg: 74  Min: 67  Max: 80    BLOOD PRESSURE RANGE:  Systolic (24hrs), Avg:115 , Min:105 , Max:121   ; Diastolic (24hrs), Avg:68, Min:65, Max:72      PULSE OXIMETRY RANGE: SpO2  Avg: 98 %  Min: 97 %  Max: 100 %    No intake/output data recorded.    Physical Exam  Constitutional: He is oriented to person, place, and time.   Thin, NAD, mostly avoids eye contact   HENT:   Head: Normocephalic and atraumatic.   Nose: Nose normal.   Mouth/Throat: Oropharynx is clear and moist.   Eyes: Pupils are equal, round, and reactive to light. Conjunctivae and EOM are normal.   Neck: Neck supple. No thyromegaly present.  Cardiovascular: Normal rate, regular rhythm, normal heart sounds and intact distal pulses.    Pulmonary/Chest: Effort normal and breath sounds normal.   Abdominal: Soft. Bowel sounds are normal.   No hepatosplenomegaly.   Musculoskeletal: He exhibits no edema or tenderness.   Right lower leg wrapped in bandage; mild redness still present but resolving   Neurological: He is alert and oriented to person, place, and time. He has normal reflexes.   Skin: Skin is warm and dry.   Psychiatric: He has a normal mood and affect. Judgment normal.     Lab Results   Component Value Date    WBC 8.9 05/29/2017    HGB 13.1 05/29/2017    HCT 38.5 (L) 05/29/2017    MCV 84.0 05/29/2017    PLT 250 05/29/2017     Lab Results   Component Value Date    NA 136 05/29/2017    K 5.1 05/29/2017    CL 101 05/29/2017    CO2 27 05/29/2017    BUN 13 05/29/2017    CREATININE 0.53 05/29/2017    GLUCOSE 110 05/29/2017    CALCIUM 8.9 05/29/2017        Lab Results   Component Value Date    LABA1C 8.2 09/28/2015      Additional results of the last 24 hours have been reviewed.    Assessment and Plan:         Active Problems:    Cellulitis and abscess of right lower  extremity  Resolved Problems:    * No resolved hospital problems. *    1. Right leg abscess - s/p I and D, on vanco and unasyn  2. DM II - per patient diet-controlled  3. Chronic hep C  4. Heroin abuse - patient interested in quitting, addiction med consult; tramadol    Disposition:await test results, await consultant recommendations and await clinical improvement    I spent over 51% of total time providing counseling or incoordination of care: > NA   I personally examined the patient and I personally reviewed chart, data, labs radiology reports    7AM-5PM please page:   Electronically signed by Rosario Jacks, DO on 05/30/17 at 1:04 PM    5PM-7AM please page:  [facility]

## 2017-05-30 NOTE — Progress Notes (Signed)
Called by nursing around 10:10 pm to report change in patient's mental status. Patient had two friends visiting him in his room. As the friends were leaving, patient was found to be less responsive. His pulse ox was measured at 65% and he was breathing at 3 times/minute. On my arrival patient would not respond to voice. He is responding to painful stimuli by opening his eyes momentarily.       Vitals:    05/30/17  2101   BP: 134/82   Pulse: 89   Resp: 8   Temp: 97.4   SpO2: 95% on 50% venturi mask     Heart: RRR, Systolic murmur  Lung: CTA anterolaterally. Bradypnea at 3-5 respirations/minute  Extremities: Rt forearm pictured below. Has a small circle of freshly dried blood at the end of a track mark.       A/P:   Drug overdose - likely heroin based on the symptoms/signs patient is displaying. Will check stat urine drug screen as well as an opioid differentiation panel as he is already on tramadol. Patient is s/p narcan 0.4 mg x1 currently. RR has improved to 8 per minute. Waking up more to voice now. Denies drugs administered by friends. Says he's just sleepy. Will monitor closely with continuous pulse ox and video monitor. Currently stable to remain on the floor.     Addendum @ 23:45: will give another dose of narcan 0.4 as RR is still 8/minute and patient still lethargic. Remains stable for floor.

## 2017-05-31 LAB — URINE DRUG SCREEN
Amphetamines, urine: POSITIVE NA
Barbiturates, Urine: NEGATIVE NA
Benzodiazepine Ur Qual: NEGATIVE NA
Cocaine Metabolites, Ur: NEGATIVE NA
Methadone, Urine: NEGATIVE NA
Opiates, Urine: POSITIVE NA
Oxycodone Screen, Ur: NEGATIVE NA
PCP, Urine: NEGATIVE NA

## 2017-05-31 LAB — VANCOMYCIN LEVEL, TROUGH: Vancomycin Tr: 8.2 ug/mL — ABNORMAL LOW (ref 15.0–20.0)

## 2017-05-31 LAB — ECHOCARDIOGRAM COMPLETE 2D W DOPPLER W COLOR: Left Ventricular Ejection Fraction: 63

## 2017-05-31 LAB — POCT GLUCOSE: POC Glucose: 127 mg/dL — ABNORMAL HIGH (ref 70–100)

## 2017-05-31 MED ORDER — NORMAL SALINE FLUSH 0.9 % IV SOLN
0.9 % | INTRAVENOUS | Status: DC | PRN
Start: 2017-05-31 — End: 2017-05-31

## 2017-05-31 MED ORDER — NALOXONE HCL 0.4 MG/ML IJ SOLN
0.4 MG/ML | Freq: Once | INTRAMUSCULAR | Status: AC
Start: 2017-05-31 — End: 2017-05-30

## 2017-05-31 MED ORDER — PERFLUTREN LIPID MICROSPHERE IV SUSP
Freq: Once | INTRAVENOUS | Status: DC | PRN
Start: 2017-05-31 — End: 2017-05-31

## 2017-05-31 MED ORDER — NALOXONE HCL 0.4 MG/ML IJ SOLN
0.4 MG/ML | Freq: Once | INTRAMUSCULAR | Status: AC
Start: 2017-05-31 — End: 2017-05-30
  Administered 2017-05-31: 04:00:00 0.4 mg via INTRAVENOUS

## 2017-05-31 MED ORDER — NALOXONE HCL 0.4 MG/ML IJ SOLN
0.4 MG/ML | INTRAMUSCULAR | Status: AC
Start: 2017-05-31 — End: 2017-05-30
  Administered 2017-05-31: 03:00:00 0.4 via INTRAVENOUS

## 2017-05-31 MED FILL — TRAMADOL HCL 50 MG PO TABS: 50 MG | ORAL | Qty: 2

## 2017-05-31 MED FILL — THERA-M PO TABS: ORAL | Qty: 1

## 2017-05-31 NOTE — Discharge Summary (Signed)
[facility] Discharge Summary and Transition Note       Mason Garza  DOB:  20-May-1966  MRN:  [id number]    ADMIT DATE:  05/29/2017  DISCHARGE DATE:  05/31/2017    PRIMARY CARE PHYSICIAN:  Ellene Route, MD    VISIT STATUS: Admission    CODE STATUS:  Prior    DISCHARGE DIAGNOSES:    Active Problems:    Cellulitis and abscess of right lower extremity    Severe opioid use disorder (HCC)  Resolved Problems:    * No resolved hospital problems. *    HOSPITAL COURSE:  Patient presented to hospital with right leg redness and lump.  He was treated for cellulitis and had bedside I and D in ER (no cultures sent).  He was placed on broad spectrum antibiotics.  Patient voiced interest in quitting heroin (he states injects but did not inject in leg) and addiction med was consulted.  During hospitalization he was found hypoxic and shallow breathing in room with his friends.  His friends denied giving patient any drugs and then they ran out of room.  Patient responded to narcan but low heart rate persisted (although patient was awake and asymptomatic) and patient was transferred to tele.  Echo showed no endocarditis.  Shortly after transfer he pulled out his IV and ran out of the room AMA.  RN was able to get him to sign AMA paperwork but he would not wait to talk with doctor.  He did not have a med rec completed or DC instructions when leaving.    PROCEDURES: bedside I and D in ER    DISPOSITION: AMA    Follow up with Ellene Route, MD to be scheduled as needed    INSTRUCTIONS TO MA/SW: Please call patient on day after discharge (must document patient  contacted within 2 business days of discharge).    FOLLOW UP QUESTIONS FOR MA/SW:  1. Did you get medications filled and taking them as instructed from discharge?  2. Are you following your discharge instructions from your hospital stay?  3. Please confirm patient is scheduled for a follow up appointment within the above time frame.    Electronically signed by Rosario Jacks, DO on 06/02/17 at 11:29 AM

## 2017-05-31 NOTE — Progress Notes (Signed)
S:   Patient seen and examined. Overall symptoms improving. Denies any withdrawal issue on the current regimen, very comfortable.    REVIEW OF SYMPTOMS: Patient denies CP or SOB.Denies audio-, visual or tactile hallucinations. Denies dizziness or visual changes. Denies acute pain. Denies rhinorrhea, lacrimation, cough or sore throat. No difficulty ambulating, urinating, swallowing. Denies rash    O:  Vitals:    05/31/17 0732   BP: 108/61   Pulse: (!) 46   Resp: 18   Temp: 97.4 F (36.3 C)   SpO2: 99%     Patient seen and examined. Alert and oriented x 3. NAD.  Head: NCAT.   Skin: warm, dry   Insight and judgement are fair. Denies suicidal or homocidal ideation. Cognition intact. Denies audio, visual or tactile hallucinations. Not responding to internal stimuli      Recent Results (from the past 24 hour(s))   POCT Glucose    Collection Time: 05/30/17  1:06 PM   Result Value Ref Range    POC Glucose 178 (H) 70 - 100 mg/dL   POCT Glucose    Collection Time: 05/30/17 10:15 PM   Result Value Ref Range    POC Glucose 127 (H) 70 - 100 mg/dL   Urine Drug Screen    Collection Time: 05/30/17 10:31 PM   Result Value Ref Range    Amphetamines, urine Positive NA    Barbiturates, Ur Negative NA    Benzodiazepine Ur Qual Negative NA    Cocaine Metabolites, Ur Negative NA    Methadone, Urine Negative NA    Opiates, Urine Positive NA    Oxycodone Screen, Ur Negative NA    PCP, Urine Negative NA       A/P  1. Opioid use d/o with withdrawal  Day #3 no use  The patient was started on tramadol 72 hours taper, on phase #2, and yesterday added clonidine 0.1 mg tid helped him a lot now he denies any withdrawals at this point, encourage the use of the prn medications for symptomology.                  2. Patient agreed on inpatient rehab , discussed it with him and he agrees, will consult social worker for referral to inpatient rehab.    Signing off   Thanks for the consult

## 2017-05-31 NOTE — Other (Signed)
Pt transfer from 4 north for telemetry monitoring for bradycardia, pt AOX4 denies pain at this time, oriented to room call light within reach. Dressing on right shin removed and redressed. Visual monitor at bedside . Will follow

## 2017-05-31 NOTE — Progress Notes (Signed)
Pt on continous pulse ox. HR dipping as low as 43 bpm. Pt asymptomatic. Spoke with Dr.McIntyre, stat ekg ordered

## 2017-05-31 NOTE — Progress Notes (Signed)
Nutrition rescreen completed. Chart reviewed. Patient to be monitored and followed by the diet technician.

## 2017-05-31 NOTE — Progress Notes (Signed)
[name] Progress Note        Mason Garza  DOB:  1965/09/23(50 y.o.)  MRN:  [id number]    Date: 05/30/17   Subjective:    HPI  Patient suspected to OD on heroin last night with friends in room.  Found to be hypoxic and placed on non-rebreather and responded to narcan.  Mason Garza denies taking any substances.  Mason Garza reports mild discomfort over the abscess.  Mason Garza states redness is much improved.  Denies fever, chills, CP, dyspnea, abdominal pain, N/V/D/C.    Scheduled Meds:  . therapeutic multivitamin-minerals  1 tablet Oral Daily   . traMADol  100 mg Oral Q6H    Followed by   . [START ON 06/01/2017] traMADol  100 mg Oral Q8H   . cloNIDine  0.1 mg Oral TID   . sodium chloride flush  3 mL Intravenous Q8H   . naproxen  500 mg Oral BID WC   . traZODone  100 mg Oral Nightly   . sodium chloride flush  10 mL Intravenous 2 times per day   . enoxaparin  40 mg Subcutaneous Daily   . ampicillin-sulbactam  3 g Intravenous Q6H   . vancomycin  20 mg/kg Intravenous Q12H   . acetaminophen  1,000 mg Oral 3 times per day     Continuous Infusions:  . sodium chloride 125 mL/hr at 05/29/17 1145     PRN Meds:perflutren lipid microspheres, sodium chloride flush, aluminum & magnesium hydroxide-simethicone, loperamide, hydrOXYzine, methocarbamol, dicyclomine, traZODone, ibuprofen, promethazine, sodium chloride flush, melatonin, labetalol, ipratropium-albuterol, polyethylene glycol, promethazine    Review of Systems - all other ROS negative    Interval Pertinent History:  Social History   Substance Use Topics   . Smoking status: Current Every Day Smoker     Packs/day: 1.00     Years: 30.00   . Smokeless tobacco: Current User     Types: Chew   . Alcohol use 0.0 oz/week      Comment: 6 pack per yr now; former 6-12 pk per day for 10 yrs       Objective:     Patient Vitals for the past 24 hrs:   BP Temp Temp src Pulse Resp SpO2   05/31/17 0732 108/61 97.4 F (36.3 C) Temporal (!) 46 18 99 %   05/31/17 0011 - - - - 12 97 %   05/30/17 2218  134/82 97.4 F (36.3 C) Temporal 89 8 95 %   05/30/17 1902 129/76 96.4 F (35.8 C) Temporal 53 18 98 %       Average, Min, and Max for last 24 hours Vitals:  TEMPERATURE:  Temp  Avg: 97.1 F (36.2 C)  Min: 96.4 F (35.8 C)  Max: 97.4 F (36.3 C)    RESPIRATIONS RANGE: Resp  Avg: 14  Min: 8  Max: 18    PULSE RANGE: Pulse  Avg: 62.7  Min: 46  Max: 89    BLOOD PRESSURE RANGE:  Systolic (24hrs), Avg:124 , Min:108 , Max:134   ; Diastolic (24hrs), Avg:73, Min:61, Max:82      PULSE OXIMETRY RANGE: SpO2  Avg: 97.3 %  Min: 95 %  Max: 99 %    No intake/output data recorded.    Physical Exam  Constitutional: Mason Garza is oriented to person, place, and time.   Thin, NAD, mostly avoids eye contact   HENT:   Head: Normocephalic and atraumatic.   Nose: Nose normal.   Mouth/Throat: Oropharynx is clear and moist.  Eyes: Pupils are equal, round, and reactive to light. Conjunctivae and EOM are normal.   Neck: Neck supple. No thyromegaly present.   Cardiovascular: Normal rate, regular rhythm, normal heart sounds and intact distal pulses.    Pulmonary/Chest: Effort normal and breath sounds normal.   Abdominal: Soft. Bowel sounds are normal.   No hepatosplenomegaly.   Musculoskeletal: Mason Garza exhibits no edema or tenderness.   mild redness still present and small lump; able to extract pus from area around incision   Neurological: Mason Garza is alert and oriented to person, place, and time. Mason Garza has normal reflexes.   Skin: Skin is warm and dry.   Psychiatric: Mason Garza has a normal mood and affect. Judgment normal.     Lab Results   Component Value Date    WBC 8.9 05/29/2017    HGB 13.1 05/29/2017    HCT 38.5 (L) 05/29/2017    MCV 84.0 05/29/2017    PLT 250 05/29/2017     Lab Results   Component Value Date    NA 136 05/29/2017    K 5.1 05/29/2017    CL 101 05/29/2017    CO2 27 05/29/2017    BUN 13 05/29/2017    CREATININE 0.53 05/29/2017    GLUCOSE 110 05/29/2017    CALCIUM 8.9 05/29/2017        Lab Results   Component Value Date    LABA1C 8.2 09/28/2015       Additional results of the last 24 hours have been reviewed.    Assessment and Plan:         Active Problems:    Cellulitis and abscess of right lower extremity  Resolved Problems:    * No resolved hospital problems. *    1. Right leg abscess - s/p I and D, on vanco and unasyn; interfering with his walking; c/s PT; improving but some drainage still present  2. DM II - per patient diet-controlled  3. Chronic hep C  4. Heroin abuse - patient interested in quitting (but ?as appears to have taken heroin in room), addiction med following; tramadol; social work for inpatient rehab  5. Murmur - do not hear but echo ordered by night doc who heard murmur  6. Bradycardia - patient sleeping; monitor to see if HR picks up    Disposition:await test results, await consultant recommendations and await clinical improvement    I spent over 51% of total time providing counseling or incoordination of care: > 35 minutes  I personally examined the patient and I personally reviewed chart, data, labs radiology reports  D/W RN multiple times    7AM-5PM please page:   Electronically signed by Rosario Jacks, DO on 05/31/17 at 10:38 AM    5PM-7AM please page:  [facility]

## 2017-05-31 NOTE — Other (Signed)
This nurse was called into patient's room after he ripped off iv and tele monitor and started running to elevator. Caught up with him and was able to get him to sign AMA form however he would not wait to speak with MD.

## 2017-05-31 NOTE — Other (Unsigned)
Patient Acct Nbr: [id number]   Primary AUTH/CERT:   Primary Insurance Company Name: [employer]  Primary Insurance Plan name: [facility]  Primary [facility] Group Number: [facility]  Primary Insurance Plan Type: Health  Primary Insurance Policy Number: [id number]

## 2017-05-31 NOTE — Progress Notes (Signed)
Pt being transferred to 5W for bradycardia. Report called and RRT notified of transfer

## 2017-06-06 ENCOUNTER — Inpatient Hospital Stay
Admission: EM | Admit: 2017-06-06 | Discharge: 2017-06-10 | Disposition: A | Discharge status: Home / Self Care | Payer: MEDICAID | Admitting: Internal Medicine

## 2017-06-06 DIAGNOSIS — L03115 Cellulitis of right lower limb: Secondary | ICD-10-CM

## 2017-06-06 DIAGNOSIS — L02415 Cutaneous abscess of right lower limb: Secondary | ICD-10-CM

## 2017-06-06 LAB — CBC WITH AUTO DIFFERENTIAL
Absolute Baso #: 0 10*3/uL (ref 0.0–0.2)
Absolute Eos #: 0 10*3/uL (ref 0.0–0.5)
Absolute Lymph #: 1.7 10*3/uL (ref 1.0–4.3)
Absolute Mono #: 0.5 10*3/uL (ref 0.0–0.8)
Absolute Neut #: 5 10*3/uL (ref 1.8–7.0)
Basophils: 0.5 % (ref 0.0–2.0)
Eosinophils: 0 % — ABNORMAL LOW (ref 1.0–6.0)
Granulocytes %: 69.1 % (ref 40.0–80.0)
Hematocrit: 40.4 % (ref 40.0–52.0)
Hemoglobin: 13.6 g/dL (ref 13.0–18.0)
Lymphocyte %: 23.4 % (ref 20.0–40.0)
MCH: 28.3 pg (ref 26.0–34.0)
MCHC: 33.6 % (ref 32.0–36.0)
MCV: 84.1 fL (ref 80.0–98.0)
MPV: 6.7 fL — ABNORMAL LOW (ref 7.4–10.4)
Monocytes: 7 % (ref 2.0–10.0)
Platelets: 319 10*3/uL (ref 140–440)
RBC: 4.8 10*6/uL (ref 4.40–5.90)
RDW: 15 % — ABNORMAL HIGH (ref 11.5–14.5)
WBC: 7.2 10*3/uL (ref 3.6–10.7)

## 2017-06-06 LAB — BASIC METABOLIC PANEL
Anion Gap: 9 NA
BUN: 14 mg/dL (ref 7–20)
CO2: 28 mmol/L (ref 22–30)
Calcium: 9.5 mg/dL (ref 8.4–10.4)
Chloride: 101 mmol/L (ref 98–107)
Creatinine: 0.64 mg/dL (ref 0.52–1.25)
EGFR IF NonAfrican American: >60 mL/min (ref >60)
Glucose: 209 mg/dL — ABNORMAL HIGH (ref 70–100)
Potassium: 4 mmol/L (ref 3.5–5.1)
Sodium: 138 mmol/L (ref 137–145)
eGFR African American: >60 mL/min (ref >60)

## 2017-06-06 LAB — SEDIMENTATION RATE: Sed Rate: 37 mm/h — ABNORMAL HIGH (ref 0–10)

## 2017-06-06 LAB — ADD ON LAB TEST

## 2017-06-06 LAB — C-REACTIVE PROTEIN: CRP: 31.2 mg/L — ABNORMAL HIGH (ref 0.0–6.0)

## 2017-06-06 MED ORDER — ONDANSETRON HCL 4 MG/2ML IJ SOLN
4 MG/2ML | Freq: Once | INTRAMUSCULAR | Status: AC
Start: 2017-06-06 — End: 2017-06-06
  Administered 2017-06-06: 22:00:00 4 mg via INTRAVENOUS

## 2017-06-06 MED ORDER — VANCOMYCIN HCL IN DEXTROSE 1-5 GM/200ML-% IV SOLN
1-5 GM/200ML-% | Freq: Once | INTRAVENOUS | Status: AC
Start: 2017-06-06 — End: 2017-06-06
  Administered 2017-06-06: 1000 mg via INTRAVENOUS

## 2017-06-06 MED ORDER — NORMAL SALINE FLUSH 0.9 % IV SOLN
0.9 % | Freq: Three times a day (TID) | INTRAVENOUS | Status: DC
Start: 2017-06-06 — End: 2017-06-10
  Administered 2017-06-07: 20:00:00 10 mL via INTRAVENOUS
  Administered 2017-06-08 – 2017-06-09 (×2): 3 mL via INTRAVENOUS

## 2017-06-06 MED ORDER — MORPHINE SULFATE (PF) 4 MG/ML IV SOLN
4 MG/ML | INTRAVENOUS | Status: DC | PRN
Start: 2017-06-06 — End: 2017-06-06
  Administered 2017-06-06: 22:00:00 4 mg via INTRAVENOUS

## 2017-06-06 MED FILL — MORPHINE SULFATE (PF) 4 MG/ML IV SOLN: 4 mg/mL | INTRAVENOUS | Qty: 1 | Fill #0

## 2017-06-06 NOTE — ED Provider Notes (Signed)
Emergency Department Encounter  [facility] EMERGENCY DEPT    Patient: Mason Garza  MRN: [id number]  DOB: 02/09/1966  Date of Evaluation: 06/06/2017  ED Provider: Berniece Papami Bauer Ausborn, APRN - CNP    As the physician-in-triage, I performed a medical screening history and physical exam on this patient.    HISTORY OF PRESENT ILLNESS  In brief, Mason Garza is a 51 y.o. male that presents for outpatient ablation of an infection of his right lower leg.  Patient states that he was admitted to the hospital earlier this week for cellulitis and abscess.  He states that he left against medical advice after receiving some antibiotics.  Patient states that he has noted the leg to be more red and painful as well as more drainage from the abscess on his right lower extremity.  States he has had some fevers.  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      PHYSICAL EXAM  ED Triage Vitals [06/06/17 1554]   Enc Vitals Group      BP 118/85      Pulse 103      Resp 18      Temp 97.8 F (36.6 C)      Temp Source Oral      SpO2 98 %      Weight 165 lb (74.8 kg)      Height 5\' 11"  (1.803 m)      Head Circumference       Peak Flow       Pain Score       Pain Loc       Pain Edu?       Excl. in GC?        On brief exam, Patient is alert and oriented 3.  Tachycardic but regular rhythm.  No murmurs, clicks, rubs, gallops noted.  Lungs are clear to auscultation bilaterally.  Abdomen is soft, nontender, nondistended, bowel sounds present 4.  Patient does have erythema on his right lower extremity that is surrounding a abscess that is a proximately the size of a silver dollar.  There is some purulent drainage noted.  This area is tender to palpation.  Warm to touch.  Sensations and strengths intact distal to wound.  Cap refill less than 3 distal to the wound.  Peripheral pulses present distal to wound.    We will initiate diagnostics/treatments as indicated and place in main ED as soon as available.    Omer Puccinelli, APRN -  CNP  [facility] [employer]       Nathifa Ritthaler K. Punam Broussard, APRN - CNP  06/06/17 1601

## 2017-06-06 NOTE — ED Triage Notes (Signed)
Redness and swelling to RLE. Pt states he was recently treated with antibiotics for it and "now it is coming back and getting red and irritated".

## 2017-06-06 NOTE — ED Provider Notes (Signed)
51 year old male presents for RIGHT lower from his cellulitis, recurrent    Patient is an IV drug abuse and diabetic. He says he usually injects drugs into his arms, nonedematous legs. He has a history of cellulitis in many, has been on antibiotics for this recently. He actually still has the purple surgical pen markers on his RIGHT leg from the previous time he was infected. He is tachycardic but he is not febrile. He is disheveled and appears tired. He denies any chest pain from his breath or abdominal pain. Denies any injury. Denies specifically injecting IV drugs into his RIGHT leg. There is a central area of drainage in the RIGHT medial calf which appears to be draining serosanguineous fluid in the center of the cellulitis.    General: Disheveled thin adult male lying supine in bed, appears tired and depleted, nontoxic     HENT: Head NCAT, EOMI with no erythema, swelling or D/C Oropharyngeal mucus membranes moist, pink, no exudate    Neck: Full ROM, supple, no rigidity    Cardio: RRR, nl s1 s2 no m/r/g, extremities warm, dry, well perfused, non-edematous    Lungs: CTAB, no wheezes, rales, rhonchi, normal work of breathing    Abdomen: Soft, NT, ND, non-rigid, BS x 4 normal    Skin: RIGHT medial calf exhibits cellulitis, about 3-4 inches tall and 2 inches wide and a circular fashion. There is no obvious fluctuant head in the center of it, but there is a small amount of serosanguineous drainage coming out of the center. No purulent drainage. It is erythematous and hot to the touch. There is no streaking cellulitis moving proximally or distally around this area. Judging by the purple skin markings on the RIGHT medial calf, this is the same the patient had cellulitis previously.    Neuro: Alert, oriented, mentating normally    Patient has obvious RIGHT lower external cellulitis, still using IV drugs, diabetic, will heal poorly. Needs IV antibiotics, blood cultures. There is a smaller drainage in the RIGHT medial  calf, it may be the very early beginnings of an abscess, but right now incision and drainage unlikely to be helpful as there is no fluctuant area in the center of the abscess, we would likely cause more tissue destruction than we would solve.    Metabolic panel shows no metabolic acidosis, good kidney function, glucose elevated at 209. CRP elevated at 31.2  Normal white blood cell count, though this may be deceptive given physical examination  Nonanemic  ESR elevated at 37    RIGHT tib-fib x-ray is normal. No fracture no dislocation no bony erosions.    Patient will be hospitalized for further care.    Patient was seen and evaluated by the Advanced Practice Provider as well as myself. I agree with the history, physical exam findings, and plan unless otherwise stated.     Jeryl Columbia, MD  06/07/17 [phone]

## 2017-06-06 NOTE — Progress Notes (Signed)
Patient arrived to 7W  [place]ia [place] to rm 742B, ambulated to bed perself without assist. Left AMA when here earlier this month. So his leg, rt shin has a wound, you can still see where they marked the erythema from last time.. Dr. Tretha SciaraSubichin here to visit patient. Doctor put a new mark where erythema and edema present this time. The center is wound 1/4cm diameter, is moist center, clear liquid comes out if you push around it. Pain at site.  No other areas of concern. Hx of diabetes, no meds. Expresses hx of depression, herion addiction, active user.  Lt hand #20, site clean and dry. Dressing intact. No noteable signs or symptoms of withdrawal. Continent of bowel and bladder. Alert and orientated x 4.

## 2017-06-06 NOTE — H&P (Signed)
[facility] Initial History and Physical       Mason Garza  DOB:  06/13/1966(51 y.o.)  MRN:  [id number]    Date: 06/06/17      Subjective:      ChiefComplaint: painful rash RRL / Cellulitis     HPI Patient is a 51 year old man with past mental history IV drug abuse, hepatitis C, neuropathy, diabetes type 2, noncompliance, and unspecified psychiatric illness presented to the MRSA department for a known cellulitis on his RIGHT lower extremity. The patient was recently admitted in the hospital for this on 05/29/17 and LEFT AMA on 05/31/17. During that admission he was found hypoxic and short of breath in his room with several friends. It was suspected that his friends gave the patient IV drugs. The friends denied this but than LEFT. [name] symptoms improved with Narcan. After this he bradycardic. The patient later LEFT AGAINST MEDICAL ADVICE. He LEFT prior to being able to be given any discharge medications. The patient states that the reason that he LEFT was because he was upset at the [facility] was being treated. Since leaving the cellulitis initially improved but started becoming worse yesterday through today.    In the emergency department his vital signs were significant for pulse 103. His laboratory findings were unremarkable. Physical exam revealed a cellulitis on his RIGHT lower extremity. A new borderline was marked. His last use of heroin was today. He states he is interested in trying to quit. He denies any other complaints.    On the floor the patient complains of cellulitis in his RIGHT lower extremity. He does not have any other complaints at this time. The patient states he is agreeable to staying in the hospital. The patient states he would like to meet with addiction medicine and he would like to quit heroin. He became somewhat agitated when asked about what happened his most recent admission. He stated again that he LEFT because he did not like the way he is being treated.    Past  Medical History:   Diagnosis Date   . Chronic hepatitis C ([place]) 11/22/2015    active IVDU   . H/O echocardiogram 05/12/11    EF62%, mild LAE   . Neuropathy    . Not immune to hepatitis B virus     need to re-vaccinate   . Psychiatric problem    . Type II or unspecified type diabetes mellitus without mention of complication, not stated as uncontrolled        History reviewed. No pertinent surgical history.    History reviewed. No pertinent family history.    Social History     Social History   . Marital status: Divorced     Spouse name: N/A   . Number of children: N/A   . Years of education: N/A     Occupational History   . Not on file.     Social History Main Topics   . Smoking status: Current Every Day Smoker     Packs/day: 1.00     Years: 30.00   . Smokeless tobacco: Never Used      Comment: nicotene patch requested   . Alcohol use No      Comment: 6 pack per yr now; former 6-12 pk per day for 10 yrs   . Drug use: Yes     Frequency: 10.0 times per week     Types: Opiates , IV      Comment: heroin IVDU daily   .  Sexual activity: Yes     Partners: Female     Other Topics Concern   . Not on file     Social History Narrative    Living with mother now - improved meal intake and assistance with DM care as mother is a former Therapist, sports       No Known Allergies    Prior to Admission medications    Medication Sig Start Date End Date Taking? Authorizing Provider   naproxen (NAPROSYN) 500 MG tablet Take 1 tablet by mouth 2 times daily as needed for Pain ((take with food)) 04/22/16   Omelia Blackwater, MD   traZODone (DESYREL) 100 MG tablet Take 1 tablet by mouth nightly 09/28/15   Omelia Blackwater, MD   glucose blood VI test strips [facility] BLOOD GLUCOSE TEST) strip 1 each by In Vitro route daily As needed. 09/28/15   Omelia Blackwater, MD   metFORMIN (GLUCOPHAGE) 1000 MG tablet Take 1 tablet by mouth 2 times daily (with meals) 09/28/15   Omelia Blackwater, MD   buPROPion (WELLBUTRIN XL) 150 MG extended release tablet Take 1 tablet by mouth every morning 09/28/15    Omelia Blackwater, MD   glucose monitoring kit (FREESTYLE) monitoring kit 1 kit by Does not apply route daily as needed (test blood sugar as needed) May have any glucometer that is covered by patient's insurance. 03/19/15   Omelia Blackwater, MD   Lancets MISC Check blood sugars daily or when symptoms develop. 05/02/14   Omelia Blackwater, MD   Lancet Devices (B-D LANCET DEVICE) MISC Check blood sugars daily or when symptoms develop. 05/02/14   Omelia Blackwater, MD       Review of Systems   Constitutional: Negative for fatigue and fever.   HENT: Negative for postnasal drip and rhinorrhea.    Respiratory: Negative for chest tightness and shortness of breath.    Cardiovascular: Negative for chest pain.   Gastrointestinal: Negative for abdominal pain, diarrhea and nausea.   Genitourinary: Negative for dysuria.   Musculoskeletal: Negative for back pain and myalgias.   Skin: Positive for rash (RLE).   Neurological: Negative for dizziness and headaches.   Psychiatric/Behavioral: Positive for agitation. Negative for suicidal ideas.       Objective:        Vitals:    06/06/17 1554 06/06/17 1850 06/06/17 2023   BP: 118/85 109/60 103/67   Pulse: 103 68 67   Resp: _0 Temp: 97.8 F (36.6 C)  97 F (36.1 C)   TempSrc: Oral  Temporal   SpO2: 98% 98% 97%   Weight: 165 lb (74.8 kg)  165 lb 12.8 oz (75.2 kg)   Height: 5' 11" (1.803 m)       Physical Exam   Constitutional: He is oriented to person, place, and time. No distress.   Sickly appearance, cooperative, nontoxic   HENT:   Head: Normocephalic and atraumatic.   Eyes: Pupils are equal, round, and reactive to light. EOM are normal.   Neck: Normal range of motion. Neck supple.   Cardiovascular: Normal rate and regular rhythm.    Pulmonary/Chest: Effort normal and breath sounds normal.   Abdominal: Soft. Bowel sounds are normal. There is no tenderness.   Musculoskeletal: He exhibits edema (minimum edema at site of cellulitis ). He exhibits no tenderness.   Neurological: He is alert and oriented to  person, place, and time. No cranial nerve deficit.   Skin: Skin is warm. Rash (RLE) noted. He is not diaphoretic. There  is erythema (RLE).   Psychiatric:   Cooperative with exam, somewhat agitated when discussing previous visit       Lab Results   Component Value Date    NA 138 06/06/2017    K 4.0 06/06/2017    CL 101 06/06/2017    CO2 28 06/06/2017    BUN 14 06/06/2017    CREATININE 0.64 06/06/2017    GLUCOSE 209 (H) 06/06/2017    CALCIUM 9.5 06/06/2017    PROT 7.6 05/16/2017    LABALBU 4.2 05/16/2017    BILITOT 0.5 05/16/2017    ALKPHOS 115 05/16/2017    AST 34 05/16/2017    ALT 37 05/16/2017    LABGLOM >60 04/27/2014    GFRAA >60 04/27/2014    AGRATIO 1.0 04/27/2014    GLOB 3.4 04/27/2014       Lab Results   Component Value Date    WBC 7.2 06/06/2017    HGB 13.6 06/06/2017    HCT 40.4 06/06/2017    MCV 84.1 06/06/2017    PLT 319 06/06/2017     Additional results since admission have been reviewed.    Assessment and Plan:      Principal Problem:    Cellulitis and abscess of right lower extremity  Active Problems:    IVDU (intravenous drug user)    Type 2 diabetes mellitus with diabetic neuropathy ([place])    Tobacco use  Resolved Problems:    * No resolved hospital problems. *      1. Cellulitis - Vancomycin  2. IVDU - tramadol taper, addiction medicine consultation  3. Diabetes type 2 - sliding-scale insulin  4. Tobacco - nicotine patch  5. Disposition - Patient agrees to stay tonight, he states he would like to quit using heroin, patient is alert 4 and is capable of leaving AMA if he Wishes.    DVT Prophylaxis: lovenox 40 q 24hr -creatinine clearance >30    BMI Classification: Body mass index is 23.12 kg/m. normal BMI 18.5-24.9      Disposition: await test results, await consultant recommendations and awaitclinical improvement    I spent over 51% of total time providing counseling or in coordination of care: > 70mnutes   Ipersonally examined the patient and I personally reviewed chart, data, labs radiology  reports     I discussed the above with the patient in detail including treatment of the cellulitis and consulting addiction medicine for assistance with getting him off heroin. He voiced understanding. He is alert and oriented and is capable of deciding to leave if he wishes.    7AM-5PM please page:   Electronically signed by SMacario Golds MD on 06/06/17 at 9:21 PM    5PM-7AM please page:  [facility]    Dictated using Dragon Naturally Speaking Medical Version 2.4  Proof read however unrecognized voice recognition errors may have occurred

## 2017-06-06 NOTE — Consults (Signed)
Pharmacy Managed Vancomycin Dosing Service Consult Note  Consult Date: 06/06/17 Consulted By: Johnney OuS. Subichin  Room:1742/1742B  Patient Name: Mason Garza  MRN: [id number]     Allergies: Patient has no known allergies.  Age: 51 y.o. Sex: male   Ht: Height: 5\' 11"  (180.3 cm)  TBW: Weight: 165 lb 12.8 oz (75.2 kg)   BMI: Body mass index is 23.12 kg/m.  DW: 75.2kg  Calculated CrCl: 1147ml/min    Lab Results   Component Value Date    CREATININE 0.64 06/06/2017    CREATININE 0.53 05/29/2017    CREATININE 0.58 05/16/2017    BUN 14 06/06/2017    BUN 13 05/29/2017    BUN 15 05/16/2017    WBC 7.2 06/06/2017    WBC 8.9 05/29/2017    WBC 10.5 05/16/2017       Infectious Diagnosis: Cellulitis/abscess, recent hospital admission for same issue (goal trough = 10-15 mcg/mL)  Antimicrobials:  Recent Abx Admin                   vancomycin (VANCOCIN) 1000 mg in dextrose 5% 200 mL IVPB (mg) 1,000 mg New Bag 06/06/17 1959                Assessment/Plan:  Pt received 1gm Vancomycin in ER at 20:00  Start Vancomycin 1.5 grams Q 12 hours based on patient age, weight, renal function, and infectious diagnosis (20 mg/kg). Will assess trough prior to 4th dose and adjust as appropriate. Will follow renal function closely.    Thank you for this consult. Please page/call with questions.  Date: 06/06/17 Time: 9:56 PM      _______________________________________  Name: Staci RighterJodi Adriane Gabbert, RPh  Phone: [phone]

## 2017-06-06 NOTE — ED Notes (Signed)
Called pt for triage with no answer. Will attempt to call back again.     Selena Battenrin M Henrickson, RN  06/06/17 1538

## 2017-06-06 NOTE — Telephone Encounter (Signed)
Name of Caller: Katina DungBillie    Contact Phone Number: [phone]    Physician requesting Consult: Dr Teena DunkStephen Subichin    Patient [facility] (facility name, room, bed number[facility] [address], [address]    Patient Diagnosis/Reason for Consult: Routine - IV drug use    Provider being paged: Dr Levada DyShein    Method used to page: Smart Web         [phone]    Time page was sent: 8:58 PM    Department of provider being paged: Psychiatry - Addiction Medicine    Smart Web Page Content: Routine Consult PT Thayer HeadingsB Holzheimer 12.7.1967 [id number] [address] re IV drug use Dr Johnney OuS Subichin [phone]

## 2017-06-06 NOTE — ED Notes (Signed)
Blood cultures drawn x2 from two different sites and sent to lab.     Erline HauMarta Poznalska, RN  06/06/17 1742

## 2017-06-06 NOTE — ED Provider Notes (Signed)
Emergency DepartmentEncounter  [facility] EMERGENCY DEPT    Patient: Mason Garza  IRS:[id number]  DOB: 11/24/1965  Date of Evaluation: 06/06/2017  ED APP Provider: Belva Bertin, APRN - CNP    EDcare was supervised by Dr. Carney Corners  who independently examined and evaluated the patient. Please see their attestation note for further details.    Chief Complaint       Chief Complaint   Patient presents with   . Leg Swelling     Redness and swelling to RLE. Pt states he was recently treated with antibiotics for it and "now it is coming back and getting red and irritated".     HOPI     Mason Garza is a 51 y.o. male whopresents to the emergency department For complaints of RIGHT lower extremity infection. Patient states this has been ongoing for approximately a week and a half. He denies any known trauma or injury. He does admit to IV drug usage. States that he is a diabetic and states his sugars have been up and down recently. He was admitted here on 10/12 when ultimately LEFT AMA on 05/31/17. States that the area seems to be getting worse. +redness. +pain. +drainage. Denies fever or chills. Denies factors that seem to alleviate. States touching and movement seems to exacerbate.  Rates his current pain level X out of 10.     ROS:     Review of Systems    GENERAL: Denies weight change, fatigue, weakness, fever  HEENT: Denies trauma, headache, dizziness, visual change, ear pain, hearing change, tinnitus, rhinorrhea  CARDIAC: Denies hypertension, murmur, angina, palpations, dyspnea on exertion, edema  RESPIRATORY: Denies shortness of breath, wheezing, cough, sputum, asthma, COPD  GI: Denies nausea, vomiting, change in bowels, abdominal pain  URINARY: Denies changes in frequency/urgency, hematuria, incontinence, flank pain  MUSCULOSKELETAL: Right lower extremity pain. +erythema. +redness. +drainage.    NEURO: Denies loss in sensation, tingling, tremors, weakness, fainting or seizures  ENDO: Denies heat/cold intolerance, polyuria,  polydipsia, or swelling around neck  PSYCH: Denies changes in mood, anxiety, depression, tension, memory    Past History     Past Medical History:   Diagnosis Date   . Chronic hepatitis C ([place]) 11/22/2015    active IVDU   . H/O echocardiogram 05/12/11    EF62%, mild LAE   . Neuropathy    . Not immune to hepatitis B virus     need to re-vaccinate   . Type II or unspecified type diabetes mellitus without mention of complication, not stated as uncontrolled      History reviewed. No pertinent surgical history.  Social History     Social History   . Marital status: Divorced     Spouse name: N/A   . Number of children: N/A   . Years of education: N/A     Social History Main Topics   . Smoking status: Current Every Day Smoker     Packs/day: 1.00     Years: 30.00   . Smokeless tobacco: Current User     Types: Chew   . Alcohol use 0.0 oz/week      Comment: 6 pack per yr now; former 6-12 pk per day for 10 yrs   . Drug use: Yes     Frequency: 10.0 times per week     Types: Opiates       Comment: heroin IVDU daily   . Sexual activity: Not Asked     Other Topics Concern   . None  Social History Narrative    Living with mother now - improved meal intake and assistance with DM care as mother is a former Therapist, sports       Medications/Allergies     Previous Medications    BUPROPION (WELLBUTRIN XL) 150 MG EXTENDED RELEASE TABLET    Take 1 tablet by mouth every morning    GLUCOSE BLOOD VI TEST STRIPS (ACURA BLOOD GLUCOSE TEST) STRIP    1 each by In Vitro route daily As needed.    GLUCOSE MONITORING KIT (FREESTYLE) MONITORING KIT    1 kit by Does not apply route daily as needed (test blood sugar as needed) May have any glucometer that is covered by patient's insurance.    LANCET DEVICES (B-D LANCET DEVICE) MISC    Check blood sugars daily or when symptoms develop.    LANCETS MISC    Check blood sugars daily or when symptoms develop.    METFORMIN (GLUCOPHAGE) 1000 MG TABLET    Take 1 tablet by mouth 2 times daily (with meals)    NAPROXEN  (NAPROSYN) 500 MG TABLET    Take 1 tablet by mouth 2 times daily as needed for Pain ((take with food))    TRAZODONE (DESYREL) 100 MG TABLET    Take 1 tablet by mouth nightly     No Known Allergies     Physical Exam       ED Triage Vitals [06/06/17 1554]   BP Temp Temp Source Pulse Resp SpO2 Height Weight   118/85 97.8 F (36.6 C) Oral 103 18 98 % '5\' 11"'  (1.803 m) 165 lb (74.8 kg)     Physical Exam    GENERAL:  Appears thin. Older than stated age. Vital signs as documented.     EYES:   Head exam is unremarkable. No scleral icterus or orbital trauma noted.     HEENT:  Mucous membranes moist. Nares patent without copious rhinorrhea.  No enlarged lymphadenopathy.      LUNGS:  Lungs are clear to auscultation, without any respiratory distress.    CARDIAC:   Rhythm is regular. Slightly tachycardic. No dysrythmias or murmurs.     ABDOMEN:  Nontender with no obvious masses, and no peritoneal signs.      EXTREMITIES:  Lateral aspect right tib fib with 5 x 3 erythematic and tender region with mild fluctuance and scant discharge. Has gone outside of previously surgically marked area. No lymphatic streaking. 2 point discrimination intact. Left extremity is nonedematous, with no obvious deformities.    SKIN:   See extremity documentation otherwise skin with Good color, with no significant rashes.  No pallor.    NEURO:  Patient is cognitive and alert and oriented x3. Conversive.  Cranial nerves II-XII are grossly intact.  Patient appears to have normal 5 out of 5 strength.  No obvious sensory deficits.  DTRs are preserved and equal bilaterally.  Patient moves without difficulty, and does not display any ataxia or cerebellar deficits.    Diagnostics   Labs:  Results for orders placed or performed during the hospital encounter of 24/40/10   Basic Metabolic Panel   Result Value Ref Range    Sodium 138 137 - 145 mmol/L    Potassium 4.0 3.5 - 5.1 mmol/L    Chloride 101 98 - 107 mmol/L    CO2 28 22 - 30 mmol/L    Anion Gap 9 NA     Glucose 209 (H) 70 - 100 mg/dL    BUN 14 7 -  20 mg/dL    CREATININE 0.64 0.52 - 1.25 mg/dL    eGFR African American >60.0 >60 mL/min    EGFR IF NonAfrican American >60.0 >60 mL/min    Calcium 9.5 8.4 - 10.4 mg/dL   Hemogram  (CBC) w/Auto Diff   Result Value Ref Range    WBC 7.2 3.6 - 10.7 10*3/uL    RBC 4.80 4.40 - 5.90 10*6/uL    Hemoglobin 13.6 13.0 - 18.0 g/dL    Hematocrit 40.4 40.0 - 52.0 %    MCV 84.1 80.0 - 98.0 fL    MCH 28.3 26.0 - 34.0 pg    MCHC 33.6 32.0 - 36.0 %    RDW 15.0 (H) 11.5 - 14.5 %    Platelets 319 140 - 440 10*3/uL    MPV 6.7 (L) 7.4 - 10.4 fL    Granulocytes % 69.1 40.0 - 80.0 %    Lymphocyte % 23.4 20.0 - 40.0 %    Monocytes 7.0 2.0 - 10.0 %    Eosinophils 0.0 (L) 1.0 - 6.0 %    Basophils 0.5 0.0 - 2.0 %    Absolute Neut # 5.0 1.8 - 7.0 10*3/uL    Absolute Lymph # 1.7 1.0 - 4.3 10*3/uL    Absolute Mono # 0.5 0.0 - 0.8 10*3/uL    Absolute Eos # 0.0 0.0 - 0.5 10*3/uL    Absolute Baso # 0.0 0.0 - 0.2 10*3/uL   Add On Lab Test   Result Value Ref Range    Add On Accepted NA   Sedimentation Rate   Result Value Ref Range    Sed Rate 37 (H) 0 - 10 mm/h   C-Reactive Protein   Result Value Ref Range    CRP 31.2 (H) 0.0 - 6.0 mg/L     Radiographs:  Xr Tibia Fibula Right (2 Views)    Result Date: 06/06/2017  Patient Name:  ZYIR, GASSERT MRN:  [id number] FIN:  [id number] ---Diagnostic Radiology--- Exam Date/Time        06/06/2017 18:16:08 EDT                             Exam                  CR Tibia/Fibula 2 Views Right                       Ordering Physician    Hardie Shackleton, NP, Tajah Schreiner                                  Accession Number      [id number]                                       CPT4 Codes [phone] () Reason For Exam concern for osteomyelitis Report Right tib-fib two views HISTORY: Concern for osteomyelitis No fracture or dislocation. No bony erosions. IMPRESSION: Normal examination. Report Dictated on Workstation: [id number] ---** Final ---** Dictated: 06/06/2017 6:19 pm Dictating Physician:  MODY, MD, MALAY Signed Date and Time: 06/06/2017 6:20 pm Signed by: MODY, MD, MALAY Transcribed Date and Time: 06/06/2017 6:19          EKG: All EKG's areinterpreted by the Emergency Department Physician in the absence of a  cardiologist.Please see their note for interpretation of EKG.    ED Course and MDM           In brief, WADE ASEBEDO isa 51 y.o. male who presented to the emergency department with complaints of worsening leg infection. Please see HPI, ROS,and physical exam for further details.    51 year old male with history IV drug usage  Was admitted last week for cellulitis and abscess and left AMA during admission  Presents with infection extending outside previously marked area    CBC without leukocytosis. H/H 13.6 and 40.4  BMP with hyperglucemia of 209  ESR and CRP 37 and 31.2 respectively.  Blood cultures obtained and pending  Xray tib fib no signs of osteomyelitis.   Vancomycin initiated.     I have discussed Percell Locus ED presentation and ED course with Dr. Bonney Aid from the [facility] service. Based on that discussion we have decided to admit [name] to their service under Dr. Bonney Aid  for further evaluation and care.      ED Medication Orders     Start Ordered     Status Ordering Provider    06/06/17 1845 06/06/17 1842  vancomycin (VANCOCIN) 1000 mg in dextrose 5% 200 mL IVPB  ONCE      Acknowledged Alvia Jablonski    06/06/17 1715 06/06/17 1705  ondansetron (ZOFRAN) injection 4 mg  ONCE      Last MAR action:  Given - by Jarold Motto, MARTA on 06/06/17 at 1731 Kaely Hollan, [place]    06/06/17 1705 06/06/17 1705  morphine (PF) injection 4 mg  EVERY 15 MIN PRN      Last MAR action:  Given - by POZNALSKA, MARTA on 06/06/17 at 1731 Celestial Barnfield    06/06/17 1600 06/06/17 1558  sodium chloride flush 0.9 % injection 3 mL  EVERY 8 HOURS      Acknowledged VORHIES, TAMI K.          Final Impression      1. Cellulitis and abscess of right lower extremity    2. Drug abuse, IV ([place])        DISPOSITION        (Please note that portions of this note may have been completed with a voice recognition program. Efforts were made to edit the dictations but occasionally words aremis-transcribed.)    Eurydice Calixto, APRN - CNP  [employer]      Belva Bertin, APRN - CNP  06/06/17 1934

## 2017-06-06 NOTE — Progress Notes (Signed)
Culture results reviewed, no further treatment needed

## 2017-06-07 LAB — BASIC METABOLIC PANEL W/ REFLEX TO MG FOR LOW K
Anion Gap: 6 NA
BUN: 14 mg/dL (ref 7–20)
CO2: 30 mmol/L (ref 22–30)
Calcium: 9.1 mg/dL (ref 8.4–10.4)
Chloride: 103 mmol/L (ref 98–107)
Creatinine: 0.71 mg/dL (ref 0.52–1.25)
EGFR IF NonAfrican American: >60 mL/min (ref >60)
Glucose: 116 mg/dL — ABNORMAL HIGH (ref 70–100)
Potassium: 4 mmol/L (ref 3.5–5.1)
Sodium: 140 mmol/L (ref 137–145)
eGFR African American: >60 mL/min (ref >60)

## 2017-06-07 LAB — MAGNESIUM: Magnesium: 2 mg/dL (ref 1.6–2.3)

## 2017-06-07 LAB — CBC
Hematocrit: 38 % — ABNORMAL LOW (ref 40.0–52.0)
Hemoglobin: 12.8 g/dL — ABNORMAL LOW (ref 13.0–18.0)
MCH: 28.3 pg (ref 26.0–34.0)
MCHC: 33.6 % (ref 32.0–36.0)
MCV: 84.4 fL (ref 80.0–98.0)
MPV: 6.5 fL — ABNORMAL LOW (ref 7.4–10.4)
Platelets: 288 10*3/uL (ref 140–440)
RBC: 4.5 10*6/uL (ref 4.40–5.90)
RDW: 14.8 % — ABNORMAL HIGH (ref 11.5–14.5)
WBC: 5.4 10*3/uL (ref 3.6–10.7)

## 2017-06-07 LAB — HEMOGLOBIN A1C
Hemoglobin A1C: 6.5 % — ABNORMAL HIGH (ref 4.0–5.7)
eAG: 140 mg/dL

## 2017-06-07 LAB — POCT GLUCOSE
POC Glucose: 138 mg/dL — ABNORMAL HIGH (ref 70–100)
POC Glucose: 165 mg/dL — ABNORMAL HIGH (ref 70–100)
POC Glucose: 99 mg/dL (ref 70–100)

## 2017-06-07 MED ORDER — INSULIN LISPRO 100 UNIT/ML SC SOLN
100 UNIT/ML | Freq: Three times a day (TID) | SUBCUTANEOUS | Status: DC
Start: 2017-06-07 — End: 2017-06-10
  Administered 2017-06-07 – 2017-06-10 (×5): 1 [IU] via SUBCUTANEOUS

## 2017-06-07 MED ORDER — TIZANIDINE HCL 4 MG PO TABS
4 MG | Freq: Four times a day (QID) | ORAL | Status: DC | PRN
Start: 2017-06-07 — End: 2017-06-10
  Administered 2017-06-07 – 2017-06-08 (×3): 4 mg via ORAL

## 2017-06-07 MED ORDER — NORMAL SALINE FLUSH 0.9 % IV SOLN
0.9 % | INTRAVENOUS | Status: DC | PRN
Start: 2017-06-07 — End: 2017-06-10

## 2017-06-07 MED ORDER — TRAMADOL HCL 50 MG PO TABS
50 MG | ORAL | Status: AC
Start: 2017-06-07 — End: 2017-06-07
  Administered 2017-06-07 (×3): 100 mg via ORAL

## 2017-06-07 MED ORDER — METFORMIN HCL 500 MG PO TABS
500 MG | Freq: Two times a day (BID) | ORAL | Status: DC
Start: 2017-06-07 — End: 2017-06-06

## 2017-06-07 MED ORDER — DEXTROSE 50 % IV SOLN
50 % | INTRAVENOUS | Status: DC | PRN
Start: 2017-06-07 — End: 2017-06-10

## 2017-06-07 MED ORDER — TRAMADOL HCL 50 MG PO TABS
50 MG | ORAL | Status: DC | PRN
Start: 2017-06-07 — End: 2017-06-06

## 2017-06-07 MED ORDER — DEXTROSE 5 % IV SOLN
5 % | INTRAVENOUS | Status: DC | PRN
Start: 2017-06-07 — End: 2017-06-10

## 2017-06-07 MED ORDER — TRAMADOL HCL 50 MG PO TABS
50 MG | Freq: Four times a day (QID) | ORAL | Status: AC
Start: 2017-06-07 — End: 2017-06-09
  Administered 2017-06-08 – 2017-06-09 (×4): 50 mg via ORAL

## 2017-06-07 MED ORDER — GLUCAGON HCL RDNA (DIAGNOSTIC) 1 MG IJ SOLR
1 MG | INTRAMUSCULAR | Status: DC | PRN
Start: 2017-06-07 — End: 2017-06-10

## 2017-06-07 MED ORDER — NAPROXEN 500 MG PO TABS
500 MG | Freq: Two times a day (BID) | ORAL | Status: DC | PRN
Start: 2017-06-07 — End: 2017-06-10
  Administered 2017-06-10: 20:00:00 500 mg via ORAL

## 2017-06-07 MED ORDER — TRAZODONE HCL 100 MG PO TABS
100 MG | Freq: Every evening | ORAL | Status: DC
Start: 2017-06-07 — End: 2017-06-10
  Administered 2017-06-07 – 2017-06-10 (×4): 100 mg via ORAL

## 2017-06-07 MED ORDER — BUPROPION HCL ER (XL) 150 MG PO TB24
150 MG | Freq: Every morning | ORAL | Status: DC
Start: 2017-06-07 — End: 2017-06-10
  Administered 2017-06-07 – 2017-06-10 (×4): 150 mg via ORAL

## 2017-06-07 MED ORDER — NICOTINE 14 MG/24HR TD PT24
14 MG/24HR | Freq: Every day | TRANSDERMAL | Status: DC
Start: 2017-06-07 — End: 2017-06-10
  Administered 2017-06-07 – 2017-06-10 (×5): 1 via TRANSDERMAL

## 2017-06-07 MED ORDER — MAGNESIUM HYDROXIDE 400 MG/5ML PO SUSP
400 MG/5ML | Freq: Every day | ORAL | Status: DC | PRN
Start: 2017-06-07 — End: 2017-06-10

## 2017-06-07 MED ORDER — ENOXAPARIN SODIUM 40 MG/0.4ML SC SOLN
40 MG/0.4ML | Freq: Every day | SUBCUTANEOUS | Status: DC
Start: 2017-06-07 — End: 2017-06-10
  Administered 2017-06-07: 14:00:00 40 mg via SUBCUTANEOUS

## 2017-06-07 MED ORDER — INSULIN LISPRO 100 UNIT/ML SC SOLN
100 UNIT/ML | Freq: Every evening | SUBCUTANEOUS | Status: DC
Start: 2017-06-07 — End: 2017-06-10
  Administered 2017-06-08 – 2017-06-10 (×2): 1 [IU] via SUBCUTANEOUS

## 2017-06-07 MED ORDER — TRAMADOL HCL 50 MG PO TABS
50 MG | Freq: Four times a day (QID) | ORAL | Status: AC
Start: 2017-06-07 — End: 2017-06-08
  Administered 2017-06-07 – 2017-06-08 (×4): 100 mg via ORAL

## 2017-06-07 MED ORDER — ONDANSETRON HCL 4 MG/2ML IJ SOLN
4 MG/2ML | Freq: Four times a day (QID) | INTRAMUSCULAR | Status: DC | PRN
Start: 2017-06-07 — End: 2017-06-10

## 2017-06-07 MED ORDER — VANCOMYCIN HCL 1000 MG IV SOLR
1000 MG | Freq: Two times a day (BID) | INTRAVENOUS | Status: DC
Start: 2017-06-07 — End: 2017-06-08
  Administered 2017-06-07 – 2017-06-08 (×3): 1500 mg via INTRAVENOUS

## 2017-06-07 MED ORDER — TRAMADOL HCL 50 MG PO TABS
50 MG | Freq: Three times a day (TID) | ORAL | Status: AC
Start: 2017-06-07 — End: 2017-06-10
  Administered 2017-06-09 – 2017-06-10 (×3): 50 mg via ORAL

## 2017-06-07 MED ORDER — NORMAL SALINE FLUSH 0.9 % IV SOLN
0.9 % | Freq: Two times a day (BID) | INTRAVENOUS | Status: DC
Start: 2017-06-07 — End: 2017-06-10
  Administered 2017-06-07 – 2017-06-09 (×6): 10 mL via INTRAVENOUS

## 2017-06-07 MED ORDER — GLUCOSE 40 % PO GEL
40 % | ORAL | Status: DC | PRN
Start: 2017-06-07 — End: 2017-06-10

## 2017-06-07 MED FILL — NICOTINE 14 MG/24HR TD PT24: 14 MG/24HR | TRANSDERMAL | Qty: 1

## 2017-06-07 MED FILL — NAPROXEN 500 MG PO TABS: 500 MG | ORAL | Qty: 1

## 2017-06-07 MED FILL — ENOXAPARIN SODIUM 40 MG/0.4ML SC SOLN: 40 MG/0.4ML | SUBCUTANEOUS | Qty: 0.4

## 2017-06-07 NOTE — Plan of Care (Signed)
Problem: Pain:  Goal: Pain level will decrease  Pain level will decrease  Outcome: Ongoing    Goal: Control of acute pain  Control of acute pain  Outcome: Ongoing      Problem: Skin Integrity - Impaired:  Goal: Will show no infection signs and symptoms  Will show no infection signs and symptoms  Outcome: Ongoing    Goal: Absence of new skin breakdown  Absence of new skin breakdown  Outcome: Ongoing

## 2017-06-07 NOTE — Care Coordination-Inpatient (Signed)
51 y/o male admitted for cellulitis and abscess of RLE after presenting with pain rash. Recent admit 10/12-10/14 for the same in which signed out AMA. Also with concern of IV drug use while hospitalized. Recent ED visits on 9/12-accidental overdose, and 9/29 for abd pain/nausea in which discharged home on both occasions. Currently on IV abx and tramadol taper. ADM and SW consulted. PTA was staying in a "rooming house" and independent. Has insurance with prescription coverage. Pt says interested in potential detox/tx. Provided with ADM information. Will need to await consultant input but anticipate likely home at discharge. Electronically signed by Rod Canobin Katieann Hungate, RN on 06/07/17 at 10:44 AM

## 2017-06-07 NOTE — Consults (Signed)
[name]:                  Dowdy, Eliav R  MEDICAL RECORD NUMBER:          [id number]  ADMISSION DATE:           06/06/2017  DATE OF EVAL:             06/07/2017  ACCOUNT#:                 [id number]  RACE:                     W  SEX:                      M  LOCATION:                 7W  1742B  DATE OF BIRTH:            [birth year]  AGE:                      51  ADMITTING PHYSICIAN:      Wayna ChaletStephen J Subichin, II, MD  ATTENDING PHYSICIAN:      Arizona ConstableStephen J Subichin, II, MD  DICTATING PHYSICIAN:      Guadlupe SpanishAlan H. Levada DyShein, MD  PCP:                       CHEMICAL DEPENDENCE CONSULTATION      IDENTIFICATION:  The patient is a 51 year old male presenting to [facility] for stabilization of severe cellulitis and abscess of  the right lower extremity.    PURPOSE FOR CHEMICAL DEPENDENCY CONSULTATION:  The patient is known to  Chemical Dependency Services from recent hospitalization at [facility] from 05/29/2017 to 05/31/2017.  The patient signed out  Against Medical Advice on 05/31/2017.  There is a history of  problematic opiate use dating back many years.  Positive history of  loss of control, increased tolerance, continued use despite known  adverse consequences and inability to abstain successfully on his own.  Withdrawal positive for anxiety, nausea, vomiting, chills, sweats,  joint and muscle pain. Prior to coming into the hospital, he was using  0.5 to 1.0 gram daily of IV heroin, and also using methamphetamine and  marijuana intermittently.  He denied any significant alcohol use.    PREVIOUS TREATMENT FOR CHEMICAL DEPENDENCY:   The patient was involved  in an outpatient program several years ago, but did not provide any  other specifics.    PSYCHIATRIC HISTORY: Pertinent for depression, for which he had been  prescribed Wellbutrin XL.  Uncertain whether he has been compliant.    SOCIAL HISTORY: The patient is divorced and unemployed.    LEGAL HISTORY:   Unable to obtain.    PAST MEDICAL HISTORY: Pertinent for   chronic hepatitis C, neuropathy,  type 2 diabetes mellitus, history of cellulitis and abscess secondary  to IV drug use.    MEDICATIONS:  The only medication for medical issues was metformin,  but I am not sure he has been compliant with it.    LABORATORY STUDIES: Sodium 140, potassium 4.0, chloride 103, glucose  116, BUN 14, creatinine 0.7.  CRP 31.2. HgA1c 6.5.  H H 12.8/38, white  blood cell count 5.4, and platelets 288.    PHYSICAL EXAMINATION:  On approaching the patient, he is bedridden,  vary fatigued and sedated.  He is barely responding to questions.  He  did admit, however, to daily heroin use prior to coming into the  hospital.    Vital Signs: BP 122/72, pulse 56, respiratory rate 16, and temperature  is 98.1.  He is flushed and diaphoretic. He is sleeping fairly soundly  and is difficult to arouse.  No overt auditory, tactile, or visual  disturbances.    PLAN:  The patient has been placed on detox regimen for opiate  withdrawal.  As the patient stabilizes, we can investigate potential  treatment options.     I did spend over 51% of my total time of 36 minutes coordinating  care, providing discussion regarding this patient's chemical  dependency, psychiatric and medical history and discussion of  treatment plan.      Guadlupe Spanish. Levada Dy, MD      DOD: 06/07/2017 12:56 P AHS/vjw  DOT: 06/07/2017 02:14 P  Document Number: [id number]  Job/Tape Number: [id number]  cc:   [place] Constable, MD        [address]        [place] [address]

## 2017-06-07 NOTE — Care Coordination-Inpatient (Signed)
Discharge planning review section verified by TCC. Electronically signed by Rod Canobin Breyon Blass, RN on 06/07/17 at 10:34 AM

## 2017-06-07 NOTE — Progress Notes (Signed)
[facility] Progress Note        Mason Garza  DOB:  1966/03/02(50 y.o.)  MRN:  [id number]    Date: 06/07/17   Subjective:    HPI    Patient says that the area of redness has decreased on the right lower extremity. Has some tenderness in it but overall improved. Patient is an IVDU and last use of heroin was the day of admission. He is interested in stopping and requested an addiction medicine consult. No associated n/v, f/c, abdominal pain.             Scheduled Meds:  . sodium chloride flush  3 mL Intravenous Q8H   . buPROPion  150 mg Oral QAM   . traZODone  100 mg Oral Nightly   . vancomycin  1,500 mg Intravenous Q12H   . sodium chloride flush  10 mL Intravenous 2 times per day   . enoxaparin  40 mg Subcutaneous Daily   . insulin lispro  0-6 Units Subcutaneous TID WC   . insulin lispro  0-3 Units Subcutaneous Nightly   . traMADol  100 mg Oral Q4H    Followed by   . traMADol  100 mg Oral Q6H    Followed by   . [START ON 06/08/2017] traMADol  50 mg Oral Q6H    Followed by   . [START ON 06/09/2017] traMADol  50 mg Oral Q8H   . nicotine  1 patch Transdermal Daily     Continuous Infusions:  . dextrose       PRN Meds:naproxen, sodium chloride flush, magnesium hydroxide, ondansetron, glucose, dextrose, glucagon (rDNA), dextrose, tiZANidine    Review of Systems   Constitutional: Negative for chills and fever.   Respiratory: Negative for cough and shortness of breath.    Cardiovascular: Negative for chest pain and leg swelling.   Gastrointestinal: Negative for abdominal pain, nausea and vomiting.   Neurological: Negative for dizziness.   Psychiatric/Behavioral: Positive for substance abuse.       Interval Pertinent History:  Social History   Substance Use Topics   . Smoking status: Current Every Day Smoker     Packs/day: 1.00     Years: 30.00   . Smokeless tobacco: Never Used      Comment: nicotene patch requested   . Alcohol use No      Comment: 6 pack per yr now; former 6-12 pk per day for 10 yrs        Objective:     Patient Vitals for the past 24 hrs:   BP Temp Temp src Pulse Resp SpO2 Height Weight   06/07/17 0728 122/72 98.1 F (36.7 C) Oral 56 16 97 % - -   06/06/17 2023 103/67 97 F (36.1 C) Temporal 67 20 97 % - 165 lb 12.8 oz (75.2 kg)   06/06/17 1850 109/60 - - 68 15 98 % - -   06/06/17 1554 118/85 97.8 F (36.6 C) Oral 103 18 98 % 5\' 11"  (1.803 m) 165 lb (74.8 kg)       Average, Min, and Max for last 24 hours Vitals:  TEMPERATURE:  Temp  Avg: 97.6 F (36.4 C)  Min: 97 F (36.1 C)  Max: 98.1 F (36.7 C)    RESPIRATIONS RANGE: Resp  Avg: 17.3  Min: 15  Max: 20    PULSE RANGE: Pulse  Avg: 73.5  Min: 56  Max: 103    BLOOD PRESSURE RANGE:Systolic (24hrs), Avg:113 , Min:103 ,  Max:122   ; Diastolic (24hrs), Avg:71, Min:60, Max:85      PULSE OXIMETRY RANGE: SpO2  Avg: 97.5 %  Min: 97 %  Max: 98 %    No intake/output data recorded.    Physical Exam   Constitutional: He is oriented to person, place, and time. He appears well-developed and well-nourished. No distress.   HENT:   Head: Normocephalic and atraumatic.   Right Ear: External ear normal.   Left Ear: External ear normal.   Nose: Nose normal.   Eyes: Pupils are equal, round, and reactive to light. EOM are normal.   Cardiovascular: Normal rate, regular rhythm and normal heart sounds.  Exam reveals no gallop and no friction rub.    No murmur heard.  Pulmonary/Chest: Effort normal and breath sounds normal. No respiratory distress. He has no wheezes. He has no rales.   Abdominal: Soft. Bowel sounds are normal. He exhibits no distension. There is no tenderness. There is no guarding.   Musculoskeletal: He exhibits no edema.   Neurological: He is alert and oriented to person, place, and time.   Skin: Skin is warm and dry. There is erythema (area of erythema on RLE appears to have receded compared to the outline).   Psychiatric: His behavior is normal.   Sleepy but arouses to voice    Vitals reviewed.      Lab Results   Component Value Date    WBC 5.4  06/07/2017    HGB 12.8 (L) 06/07/2017    HCT 38.0 (L) 06/07/2017    MCV 84.4 06/07/2017    PLT 288 06/07/2017     Lab Results   Component Value Date    NA 140 06/07/2017    K 4.0 06/07/2017    CL 103 06/07/2017    CO2 30 06/07/2017    BUN 14 06/07/2017    CREATININE 0.71 06/07/2017    GLUCOSE 116 06/07/2017    CALCIUM 9.1 06/07/2017        Lab Results   Component Value Date    LABA1C 6.5 (H) 06/07/2017      Additionalresults of the last 24 hours have been reviewed.    Assessment and Plan:         Principal Problem:      Cellulitis and abscess of right lower extremity - appears to be improving on vancomycin monotherapy so will continue as is. Try to obtain wound culture. Blood cultures pending. Will need at least 24h of IV ABx due to failure of outpatient therapy.    Active Problems:    1.   Severe opioid use disorder/IVDU (intravenous drug user) - addiction med consulted, on tramadol taper, tizanidine  2.   Type 2 diabetes mellitus with diabetic neuropathy (HCC) - Monitor BGTs, holding home metformin. A1c 6.5%, if BGTs climb will need basal insulin while inpatient.  3.   Tobacco use - nicotine patch  4.   Depression - On wellbutrin  5.   Chronic hepatitis C (HCC) - not a candidate for treatment at this time due to active abuse                DVT Prophylaxis: lovenox 40 q 24hr - creatinine clearance >30    Disposition: await test results and await clinical improvement    I spent over 51% of total time providing counseling or in coordination of care: > NA   discussed with nurse, patient and family updated, I personally examined the patient and Ipersonally reviewed chart, data, labs  radiology reports    6AM-6PM please page:   Electronically signed by [name], MD on 06/07/17 at 9:41 AM    6PM-6AM please page:  [facility] IM nocturnist

## 2017-06-07 NOTE — Consults (Signed)
IP Consult to Addiction Medicine  Consult performed by: Rodena GoldmannSHEIN, Nalanie Winiecki H  Consult ordered by: Teena DunkSUBICHIN, STEPHEN      CD evaluation dictated---#[id number]

## 2017-06-07 NOTE — Plan of Care (Signed)
Problem: Pain:  Goal: Pain level will decrease  Pain level will decrease   Outcome: Ongoing    Goal: Control of acute pain  Control of acute pain   Outcome: Ongoing      Problem: Skin Integrity - Impaired:  Goal: Will show no infection signs and symptoms  Will show no infection signs and symptoms   Outcome: Ongoing    Goal: Absence of new skin breakdown  Absence of new skin breakdown   Outcome: Ongoing

## 2017-06-08 LAB — CBC
Hematocrit: 38 % — ABNORMAL LOW (ref 40.0–52.0)
Hemoglobin: 12.6 g/dL — ABNORMAL LOW (ref 13.0–18.0)
MCH: 28.7 pg (ref 26.0–34.0)
MCHC: 33.3 % (ref 32.0–36.0)
MCV: 86.3 fL (ref 80.0–98.0)
MPV: 7.1 fL — ABNORMAL LOW (ref 7.4–10.4)
Platelets: 249 10*3/uL (ref 140–440)
RBC: 4.4 10*6/uL (ref 4.40–5.90)
RDW: 15 % — ABNORMAL HIGH (ref 11.5–14.5)
WBC: 6.2 10*3/uL (ref 3.6–10.7)

## 2017-06-08 LAB — POCT GLUCOSE
POC Glucose: 152 mg/dL — ABNORMAL HIGH (ref 70–100)
POC Glucose: 106 mg/dL — ABNORMAL HIGH (ref 70–100)
POC Glucose: 179 mg/dL — ABNORMAL HIGH (ref 70–100)
POC Glucose: 177 mg/dL — ABNORMAL HIGH (ref 70–100)
POC Glucose: 148 mg/dL — ABNORMAL HIGH (ref 70–100)

## 2017-06-08 LAB — BASIC METABOLIC PANEL
Anion Gap: 6 NA
BUN: 16 mg/dL (ref 7–20)
CO2: 27 mmol/L (ref 22–30)
Calcium: 9 mg/dL (ref 8.4–10.4)
Chloride: 105 mmol/L (ref 98–107)
Creatinine: 0.75 mg/dL (ref 0.52–1.25)
EGFR IF NonAfrican American: >60 mL/min (ref >60)
Glucose: 138 mg/dL — ABNORMAL HIGH (ref 70–100)
Potassium: 4.6 mmol/L (ref 3.5–5.1)
Sodium: 139 mmol/L (ref 137–145)
eGFR African American: >60 mL/min (ref >60)

## 2017-06-08 MED ORDER — CEPHALEXIN 500 MG PO CAPS
500 MG | Freq: Four times a day (QID) | ORAL | Status: DC
Start: 2017-06-08 — End: 2017-06-10
  Administered 2017-06-08 – 2017-06-10 (×9): 500 mg via ORAL

## 2017-06-08 MED FILL — CEPHALEXIN 500 MG PO CAPS: 500 MG | ORAL | Qty: 1

## 2017-06-08 NOTE — Plan of Care (Signed)
Problem: Nutrition Deficit:  Intervention: Assess nutritional intake  Pt is currently on General Diet at this time, history of DM noted, HgbA1c 6.5% (06/07/17)- monitor need for carbohydrate diet restrictions of 75gm CHO/meal to aide in blood glucose control to also promote wound healing   Pt has had Juven in the past, he is agreeable to Ensure at this time vanilla or strawberry flavor, per MNT Protocol: will provide Ensure Enlive (248fl oz provides 350 calories, 20gm protein) at 1000- strawberry flavor and Ensure High Protein (598fl oz provides 160 calories, 16gm protein) at 1400- vanilla flavor to aide in wound healing.  Consider daily MVI and Vitamin C to aide in skin integrity       Goal: Ability to achieve adequate nutritional intake will improve  Ability to achieve adequate nutritional intake will improve  PO intake will be >50% at meals/supplements

## 2017-06-08 NOTE — Plan of Care (Signed)
Problem: Pain:  Goal: Pain level will decrease  Pain level will decrease   Outcome: Met This Shift    Goal: Control of acute pain  Control of acute pain   Outcome: Met This Shift      Problem: Nutrition Deficit:  Goal: Ability to achieve adequate nutritional intake will improve  Ability to achieve adequate nutritional intake will improve   Outcome: Met This Shift

## 2017-06-08 NOTE — Progress Notes (Signed)
[name]:                  Hagans, Kiev R  MEDICAL RECORD NUMBER:          [id number]  ADMISSION DATE:           06/06/2017  DATE OF EVAL:             06/08/2017  ACCOUNT#:                 [id number]  RACE:                     W  SEX:                      M  LOCATION:                 7W  1742B  DATE OF BIRTH:            [birth year]  AGE:                      51  ADMITTING PHYSICIAN:      Deborra MedinaBryan P O'Connell, MD  ATTENDING PHYSICIAN:      [place] ConstableStephen J Subichin, II, MD  DICTATING PHYSICIAN:      Guadlupe SpanishAlan H. Levada DyShein, MD  PCP:                      CHEMICAL DEPENDENCE PROGRESS NOTE      HISTORY OF PRESENT ILLNESS:  The patient remains on tramadol regimen  for opiate withdrawal syndrome.  He also continues on IV antibiotics  for cellulitis, monitoring by the medical team.  In meeting with the  patient today, he did state that withdrawal symptomatology has been  managed effectively with the utilization of tramadol.  Denies chills,  cramping, joint and muscle pain, nausea, vomiting.    PHYSICAL EXAMINATION:  Vital signs:  Blood pressure 99/49, pulse 53,  respiratory rate 16, temperature 97.1.  No overt tremors.  He is not  flushed or diaphoretic.  No auditory, tactile, or visual disturbances.      ASSESSMENT AND PLAN:  At this point in time, will continue detox  regimen.  As noted, he remains on IV antibiotics for cellulitis.  Also  remains on Wellbutrin and trazodone for depression.  Will follow and  reassess on 06/09/2017.    TIME SPENT:  I did spend over 51% of my time of 27 minutes  coordinating care and providing discussion regarding this patient's  chemical dependency, psychiatric, and medical history, and discussion  of treatment plan.                Guadlupe SpanishAlan H. Levada DyShein, MD    DOD:06/08/2017 02:11 P  AHS/lds  DOT:06/08/2017 02:44 P  Document Number: [id number]  Job Number: [id number]    cc:   [place] ConstableStephen J Subichin, II, MD        [address]        [place] [id number]

## 2017-06-08 NOTE — Progress Notes (Signed)
[facility] Progress Note        Mason Garza  DOB:  01/19/1966(50 y.o.)  MRN:  [id number]    Date: 06/08/17   Subjective:    HPI    Patient says that he feels ok this morning. The area of redness on the leg has decreased. He denies pain. No fever/chills. No n/v. Tolerating PO, last BM yesterday. He's doing well on the tramadol taper. Withdrawal symptoms are controlled at this time.          Scheduled Meds:  . sodium chloride flush  3 mL Intravenous Q8H   . buPROPion  150 mg Oral QAM   . traZODone  100 mg Oral Nightly   . vancomycin  1,500 mg Intravenous Q12H   . sodium chloride flush  10 mL Intravenous 2 times per day   . enoxaparin  40 mg Subcutaneous Daily   . insulin lispro  0-6 Units Subcutaneous TID WC   . insulin lispro  0-3 Units Subcutaneous Nightly   . traMADol  50 mg Oral Q6H    Followed by   . [START ON 06/09/2017] traMADol  50 mg Oral Q8H   . nicotine  1 patch Transdermal Daily     Continuous Infusions:  . dextrose       PRN Meds:naproxen, sodium chloride flush, magnesium hydroxide, ondansetron, glucose, dextrose, glucagon (rDNA), dextrose, tiZANidine    ROS    Interval Pertinent History:  Social History   Substance Use Topics   . Smoking status: Current Every Day Smoker     Packs/day: 1.00     Years: 30.00   . Smokeless tobacco: Never Used      Comment: nicotene patch requested   . Alcohol use No      Comment: 6 pack per yr now; former 6-12 pk per day for 10 yrs       Objective:     Patient Vitals for the past 24 hrs:   BP Temp Temp src Pulse Resp SpO2 Height   06/08/17 1025 - - - - - - 5\' 11"  (1.803 m)   06/08/17 0757 (!) 99/49 97.1 F (36.2 C) Temporal 53 16 96 % -   06/07/17 1848 105/70 98 F (36.7 C) Temporal 87 16 94 % -       Average, Min, and Max for last 24 hours Vitals:  TEMPERATURE:  Temp  Avg: 97.6 F (36.4 C)  Min: 97.1 F (36.2 C)  Max: 98 F (36.7 C)    RESPIRATIONS RANGE: Resp  Avg: 16  Min: 16  Max: 16    PULSE RANGE: Pulse  Avg: 70  Min: 53  Max: 87    BLOOD  PRESSURE RANGE:Systolic (24hrs), Avg:102 , Min:99 , Max:105   ; Diastolic (24hrs), Avg:60, Min:49, Max:70      PULSE OXIMETRY RANGE: SpO2  Avg: 95 %  Min: 94 %  Max: 96 %    I/O last 3 completed shifts:  In: 800 [P.O.:800]  Out: -     Physical Exam    Lab Results   Component Value Date    WBC 5.4 06/07/2017    HGB 12.8 (L) 06/07/2017    HCT 38.0 (L) 06/07/2017    MCV 84.4 06/07/2017    PLT 288 06/07/2017     Lab Results   Component Value Date    NA 140 06/07/2017    K 4.0 06/07/2017    CL 103 06/07/2017    CO2 30 06/07/2017  BUN 14 06/07/2017    CREATININE 0.71 06/07/2017    GLUCOSE 116 06/07/2017    CALCIUM 9.1 06/07/2017        Lab Results   Component Value Date    LABA1C 6.5 (H) 06/07/2017      Additionalresults of the last 24 hours have been reviewed.    Assessment and Plan:         Principal Problem:      Cellulitis and abscess of right lower extremity - Continues to improve. Vanc trough pending. Blood cultures pending. Will switch to PO ABx tomorrow and prep for discharge. Will leave on IV ABx because he will need to be in house for at least 24h more due to the withdrawal treatment.    Active Problems:    1.   Severe opioid use disorder/IVDU (intravenous drug user) - addiction med following, on tramadol taper, tizanidine  2.   Type 2 diabetes mellitus with diabetic neuropathy (HCC) - Monitor BGTs (looking good today), holding home metformin. A1c 6.5%, if BGTs climb will need basal insulin while inpatient.  3.   Tobacco use - nicotine patch  4.   Depression - On wellbutrin and trazodone here but hadn't been using as outpatient. He says that he would like to continue it though.  5.   Chronic hepatitis C (HCC) - not a candidate for treatment at this time due to active abuse          DVT Prophylaxis: lovenox 40 q 24hr - creatinine clearance >30    Disposition: await test results and await clinical improvement    I spent over 51% of total time providing counseling or in coordination of care: > NA   discussed  with nurse, patient and family updated, I personally examined the patient and Ipersonally reviewed chart, data, labs radiology reports    6AM-6PM please page:   Electronically signed by [name], MD on 06/08/17 at 11:50 AM    6PM-6AM please page:  SPI IM nocturnist

## 2017-06-08 NOTE — Plan of Care (Signed)
Problem: Pain:  Goal: Pain level will decrease  Pain level will decrease   Outcome: Ongoing    Goal: Control of acute pain  Control of acute pain   Outcome: Ongoing      Problem: Skin Integrity - Impaired:  Goal: Will show no infection signs and symptoms  Will show no infection signs and symptoms   Outcome: Ongoing    Goal: Absence of new skin breakdown  Absence of new skin breakdown   Outcome: Ongoing      Problem: Nutrition Deficit:  Goal: Ability to achieve adequate nutritional intake will improve  Ability to achieve adequate nutritional intake will improve   Outcome: Ongoing

## 2017-06-08 NOTE — Care Coordination-Inpatient (Signed)
SPOKE WITH PT, SITTING UP IN CHAIR.  HERE WITH CELLULITIS AND OPIATE WITHDRAWAL.  ADDICTION MEDICINE FOLLOWING, CURRENTLY ON ULTRAM TAPER. REMAINS ON IV VANC Q 12 HR. BLOOD CULTURES NEG X 24 HR, WOUND CULTURE PENDING.  AFEBRILE.  DID GIVE PT ADM HOTLINE #, ENCOURAGED HIM TO CALL FOR HELP.  ANTICIPATE DISCHARGE TO HOME. Electronically signed by Robyn HaberJoan Celie Desrochers, RN on 06/08/2017 at 2:16 PM

## 2017-06-08 NOTE — Other (Signed)
Advised Dr. Donald Proseonnell of wound culture growing Strep pyrogenies.

## 2017-06-08 NOTE — Progress Notes (Signed)
CD progress note dictated---#[id number]

## 2017-06-08 NOTE — Care Coordination-Inpatient (Signed)
Received consult today for community support.  Psych eval completed, waiting on ADM eval.  Will follow. Electronically signed by Merri BrunetteBeth Brayln Duque, LSW on 06/08/2017 at 11:13 AM

## 2017-06-08 NOTE — Progress Notes (Signed)
Vancomycin therapy has been discontinued by Dr. Earnest BaileyBryan O'Connell on 06/08/17.  Thank you for the consult.  Pharmacy signing off for vancomycin dosing.  Eugenie FillerAmanda Devanee Pomplun, PharmD  Date: 06/08/17 Time: 3:05 PM

## 2017-06-08 NOTE — Progress Notes (Signed)
Nutrition Assessment    Type and Reason for Visit: Initial, Positive Nutrition Screen (pressure ulcer/wound)    Nutrition Recommendations:   1. Pt is currently on General Diet at this time, history of DM noted, HgbA1c 6.5% (06/07/17)- monitor need for carbohydrate diet restrictions of 75gm CHO/meal to aide in blood glucose control to also promote wound healing   2. Will continue to monitor PO intake for adequacy  3. Pt denies weight changes PTA.   4. Pt has had Juven in the past, he is agreeable to Ensure at this time vanilla or strawberry flavor, per MNT Protocol: will provide Ensure Enlive (37fl oz provides 350 calories, 20gm protein) at 1000- strawberry flavor and Ensure High Protein (46fl oz provides 160 calories, 16gm protein) at 1400- vanilla flavor to aide in wound healing.  5. Consider daily MVI and Vitamin C to aide in skin integrity   6. Will continue to monitor weight changes, labs and overall nutrition status   7. RD will continue to follow up weekly     Malnutrition Assessment:   Malnutrition Status: At risk for malnutrition    Nutrition Diagnosis:    Problem: Increased nutrient needs   Etiology: related to  (skin integrity/wound healing)    . Signs and symptoms:  as evidenced by  (unstageable/abscess to RLE )    Nutrition Assessment:   Subjective Assessment: pt is resting in bed, very soft spoken during interview and acts as if he doesnt want to speak with RD, states that his appetite is good and he is waiting on breakfast to come up to room, pt denies food allergies and denies chewing/swallowing issues, pt denies unintentional weight changes PTA and states his weight is stable at 165#, pt denies educaitonal needs, is agreeable to have Ensure added, no chocolate flavor to aide in wound healing, pt with recent admit where he left AMA on 05/31/17, admit for cellulitis of RLE    Nutrition-Focused Physical Findings: Braden = 21, right leg unstageable/abscess, active bowel sounds, RUE trace, RLE +1  pitting edema, +I/O, stable weight PTA     Wound Type:  (RLE abscess/wound )      Current Nutrition Therapies:   Oral Diet Orders: General    Oral Diet intake: 76-100% (dinner yesterday, no breakfast yet today )   Oral Nutrition Supplement (ONS) Orders: None   ONS intake:  (no supplement ordered)      Anthropometric Measures:   Ht: 5\' 11"  (180.3 cm)    Current Body Wt: 165 lb 12.6 oz (75.2 kg)   Admission Body Wt: 165 lb (74.8 kg) (stated)   Usual Body Wt: 165 lb (74.8 kg) (stable per pt)   % Weight Change: pt denies unintentional weight changes PTA,  per EPIC: 165# on 9/29, 165# on 10/12   Ideal Body Wt: 172 lb 6.4 oz (78.2 kg), % Ideal Body 96%   BMI Classification: BMI 18.5 - 24.9 Normal Weight      Comparative Standards (Estimated Nutrition Needs):   Estimated Daily Total Kcal: 1880-2256kcals/day    Estimated Daily Protein (g): 75-90gm pro/day    Fluid needs per MD recommendations     Estimated Intake vs Estimated Needs: Intake Meets Needs    Nutrition Risk Level: High    Nutrition Interventions:   Modify current diet, Start ONS  Continued Inpatient Monitoring, Education declined    Nutrition Evaluation:    Evaluation: Goals set    Goals: PO intake will be >50% at meals/supplements     Monitoring: Meal  Intake, Supplement Intake, Diet Tolerance, Skin Integrity, Wound Healing, Fluid Balance, Ascites/Edema, Patient/Family Education, Pertinent Labs, Weight    See Adult Nutrition Doc Flowsheet for more detail.     Electronically signed by Willey Bladeaylor Gurpreet Mikhail, RD, LD on 06/08/17 at 10:30 AM    Contact Number: pager x [phone]

## 2017-06-09 LAB — POCT GLUCOSE
POC Glucose: 114 mg/dL — ABNORMAL HIGH (ref 70–100)
POC Glucose: 134 mg/dL — ABNORMAL HIGH (ref 70–100)
POC Glucose: 145 mg/dL — ABNORMAL HIGH (ref 70–100)
POC Glucose: 182 mg/dL — ABNORMAL HIGH (ref 70–100)

## 2017-06-09 MED ORDER — SULFAMETHOXAZOLE-TRIMETHOPRIM 800-160 MG PO TABS
800-160 MG | Freq: Two times a day (BID) | ORAL | Status: DC
Start: 2017-06-09 — End: 2017-06-10
  Administered 2017-06-09 – 2017-06-10 (×2): 1 via ORAL

## 2017-06-09 MED FILL — NICOTINE 14 MG/24HR TD PT24: 14 MG/24HR | TRANSDERMAL | Qty: 1

## 2017-06-09 MED FILL — SULFAMETHOXAZOLE-TRIMETHOPRIM 800-160 MG PO TABS: 800-160 MG | ORAL | Qty: 1

## 2017-06-09 MED FILL — CEPHALEXIN 500 MG PO CAPS: 500 MG | ORAL | Qty: 1

## 2017-06-09 NOTE — Progress Notes (Signed)
Pt. Refusing to have morning labs drawn.

## 2017-06-09 NOTE — Care Coordination-Inpatient (Signed)
SPOKE WITH PT TODAY, DOING OK.  REMAINS ON ULTRAM TAPER.  WOUND CULTURE WITH STAPH AND STREP PYOGENES.  CHANGED TO PO BACTRIM DS BID.  ANTICIPATE DISCHARGE TO HOME TOMORROW AS WILL BE DONE WITH ULTRAM TAPER. Electronically signed by Robyn HaberJoan Jasleen Riepe, RN on 06/09/2017 at 1:04 PM

## 2017-06-09 NOTE — Plan of Care (Signed)
Problem: Pain:  Goal: Pain level will decrease  Pain level will decrease    Outcome: Met This Shift    Goal: Control of acute pain  Control of acute pain   Outcome: Met This Shift      Problem: Nutrition Deficit:  Goal: Ability to achieve adequate nutritional intake will improve  Ability to achieve adequate nutritional intake will improve   Outcome: Met This Shift      Problem: Pain:  Goal: Pain level will decrease  Pain level will decrease    Outcome: Met This Shift

## 2017-06-09 NOTE — Progress Notes (Signed)
[facility] Progress Note        Mason Garza  DOB:  04-08-1966(50 y.o.)  MRN:  [id number]    Date: 06/09/17   Subjective:    HPI    Patient says he's doing well today. No pain in the leg. Wound no longer draining. Denies f/c. No CP/SOB. Withdrawal symptoms very minimal. Tolerating PO, no n/v. Moving bowels.          Scheduled Meds:  . sulfamethoxazole-trimethoprim  1 tablet Oral 2 times per day   . cephALEXin  500 mg Oral 4 times per day   . sodium chloride flush  3 mL Intravenous Q8H   . buPROPion  150 mg Oral QAM   . traZODone  100 mg Oral Nightly   . sodium chloride flush  10 mL Intravenous 2 times per day   . enoxaparin  40 mg Subcutaneous Daily   . insulin lispro  0-6 Units Subcutaneous TID WC   . insulin lispro  0-3 Units Subcutaneous Nightly   . traMADol  50 mg Oral Q8H   . nicotine  1 patch Transdermal Daily     Continuous Infusions:  . dextrose       PRN Meds:naproxen, sodium chloride flush, magnesium hydroxide, ondansetron, glucose, dextrose, glucagon (rDNA), dextrose, tiZANidine    Review of Systems   Constitutional: Negative for chills and fever.   Respiratory: Negative for cough and shortness of breath.    Cardiovascular: Negative for chest pain and leg swelling.   Gastrointestinal: Negative for abdominal pain, nausea and vomiting.   Neurological: Negative for dizziness.   Psychiatric/Behavioral: Positive for substance abuse.       Interval Pertinent History:  Social History   Substance Use Topics   . Smoking status: Current Every Day Smoker     Packs/day: 1.00     Years: 30.00   . Smokeless tobacco: Never Used      Comment: nicotene patch requested   . Alcohol use No      Comment: 6 pack per yr now; former 6-12 pk per day for 10 yrs       Objective:     Patient Vitals for the past 24 hrs:   BP Temp Temp src Pulse Resp SpO2   06/09/17 0733 114/77 98.6 F (37 C) Temporal 61 16 97 %   06/08/17 1841 105/63 97.8 F (36.6 C) Temporal 62 20 98 %       Average, Min, and Max for last 24 hours  Vitals:  TEMPERATURE:  Temp  Avg: 98.2 F (36.8 C)  Min: 97.8 F (36.6 C)  Max: 98.6 F (37 C)    RESPIRATIONS RANGE: Resp  Avg: 18  Min: 16  Max: 20    PULSE RANGE: Pulse  Avg: 61.5  Min: 61  Max: 62    BLOOD PRESSURE RANGE:Systolic (24hrs), Avg:110 , Min:105 , Max:114   ; Diastolic (24hrs), Avg:70, Min:63, Max:77      PULSE OXIMETRY RANGE: SpO2  Avg: 97.5 %  Min: 97 %  Max: 98 %    No intake/output data recorded.    Physical Exam   Constitutional: He is oriented to person, place, and time. He appears well-developed and well-nourished. No distress.   HENT:   Head: Normocephalic and atraumatic.   Right Ear: External ear normal.   Left Ear: External ear normal.   Nose: Nose normal.   Eyes: Pupils are equal, round, and reactive to light. EOM are normal.   Pulmonary/Chest: Effort  normal. No respiratory distress.   Abdominal: Soft. He exhibits no distension. There is no tenderness. There is no guarding.   Musculoskeletal: He exhibits no edema.   Neurological: He is alert and oriented to person, place, and time.   Skin: Skin is warm and dry. No erythema (No erythema. No induration/fluctuance.).   Psychiatric: He has a normal mood and affect. His behavior is normal.   More alert today   Vitals reviewed.      Lab Results   Component Value Date    WBC 6.2 06/08/2017    HGB 12.6 (L) 06/08/2017    HCT 38.0 (L) 06/08/2017    MCV 86.3 06/08/2017    PLT 249 06/08/2017     Lab Results   Component Value Date    NA 139 06/08/2017    K 4.6 06/08/2017    CL 105 06/08/2017    CO2 27 06/08/2017    BUN 16 06/08/2017    CREATININE 0.75 06/08/2017    GLUCOSE 138 06/08/2017    CALCIUM 9.0 06/08/2017        Lab Results   Component Value Date    LABA1C 6.5 (H) 06/07/2017      Additionalresults of the last 24 hours have been reviewed.    Assessment and Plan:         Principal Problem:    Cellulitis and abscess of right lower extremity - Continues to improve. Wound culture now showing both strep pyogenes and MRSA. He was transitioned  from IV vanc over to cephalexin. Added on Bactrim today due to the MRSA showing up. Due to the MRSA I will check an US of the leg to ensure no abscess.    Active Problems:    1. Severe opioid use disorder/IVDU (intravenous drug user) - addiction med following, on tramadol taper which ends tomorrow, tizanidine  2. Type 2 diabetes mellitus with diabetic neuropathy (HCC) - Monitor BGTs (looking good today), holding home metformin. A1c 6.5%, if BGTs climb will need basal insulin while inpatient.  3. Tobacco use - nicotine patch  4. Depression - On wellbutrin and trazodone here but hadn't been using as outpatient. He says that he would like to continue it though.  5. Chronic hepatitis C (HCC) - not a candidate for treatment at this time due to active abuse              DVT Prophylaxis: lovenox 40 q 24hr - creatinine clearance >30    Disposition: await test results and await clinical improvement    I spent over 51% of total time providing counseling or in coordination of care: >NA  discussed with nurse, patient and family updated, I personally examined the patient and Ipersonally reviewed chart, data, labs radiology reports    6AM-6PM please page:   Electronically signed by Esaw GrandchildBryan Patrick O'Connell, MD on 06/09/17 at 2:26 PM    6PM-6AM please page:  SPI IM nocturnist

## 2017-06-09 NOTE — Progress Notes (Signed)
CD progress note dictated---#[id number]

## 2017-06-09 NOTE — Progress Notes (Signed)
Mason Garza:                  Pollitt, Raudel R  MEDICAL RECORD NUMBER:          [id number]  ADMISSION DATE:           06/06/2017  DATE OF EVAL:             06/09/2017  ACCOUNT#:                 [id number]  RACE:                     W  SEX:                      M  LOCATION:                 7W  1742B  DATE OF BIRTH:            20-Dec-1965  AGE:                      51  ADMITTING PHYSICIAN:      Deborra MedinaBryan P O'Connell, MD  ATTENDING PHYSICIAN:      [place] ConstableStephen J Subichin, II, MD  DICTATING PHYSICIAN:      Guadlupe SpanishAlan H. Levada DyShein, MD  PCP:                      CHEMICAL DEPENDENCE PROGRESS NOTE    HISTORY OF PRESENT ILLNESS:  The patient is progressing uneventfully  in the detoxification.  He remains on tramadol regimen for opiate  withdrawal syndrome.    In meeting with the patient today, he was resting in bed fairly  comfortably.  He stated that withdrawal symptomatology is slowly but  steadily resolving.  Chills, cramping, joint and muscle pain are  improved.    PHYSICAL EXAMINATION:  Vital signs: Blood pressure 114/77, pulse 61,  respiratory rate 16, and temperature 98.6.  No overt tremors. He is  not flushed or diaphoretic.  No auditory, tactile, or visual  disturbances.    PLAN:  At this point in time, the patient remains on detox regimen for  opiate withdrawal.  Will monitor.  Anticipate completion of  detoxification on 06/10/2017.  Patient will need [place] transition [place]  [place].    I did spend over 51% total time 26 minutes coordinating care,  providing discussion regarding this patient's chemical dependency,  psychiatric and medical history, and discussion of treatment plan.      Guadlupe SpanishAlan H. Levada DyShein, MD    DOD:06/09/2017 11:52 A  AHS/vjw  DOT:06/09/2017 01:26 P  Document Number: [id number]  Job Number: [id number]    cc:   [place] ConstableStephen J Subichin, II, MD        [address]        [place] [id number]

## 2017-06-10 LAB — POCT GLUCOSE
POC Glucose: 195 mg/dL — ABNORMAL HIGH (ref 70–100)
POC Glucose: 132 mg/dL — ABNORMAL HIGH (ref 70–100)
POC Glucose: 169 mg/dL — ABNORMAL HIGH (ref 70–100)

## 2017-06-10 LAB — CULTURE, AEROBIC

## 2017-06-10 MED ORDER — TRAZODONE HCL 100 MG PO TABS
100 MG | ORAL_TABLET | Freq: Every evening | ORAL | 0 refills | Status: DC
Start: 2017-06-10 — End: 2017-10-05

## 2017-06-10 MED ORDER — SULFAMETHOXAZOLE-TRIMETHOPRIM 800-160 MG PO TABS
800-160 MG | ORAL_TABLET | Freq: Two times a day (BID) | ORAL | 0 refills | Status: AC
Start: 2017-06-10 — End: 2017-06-13

## 2017-06-10 MED ORDER — CEPHALEXIN 500 MG PO CAPS
500 MG | ORAL_CAPSULE | Freq: Four times a day (QID) | ORAL | 0 refills | Status: DC
Start: 2017-06-10 — End: 2017-06-10

## 2017-06-10 MED ORDER — NAPROXEN 500 MG PO TABS
500 MG | ORAL_TABLET | Freq: Two times a day (BID) | ORAL | 3 refills | Status: DC | PRN
Start: 2017-06-10 — End: 2017-12-18

## 2017-06-10 MED ORDER — BUPROPION HCL ER (XL) 150 MG PO TB24
150 MG | ORAL_TABLET | Freq: Every morning | ORAL | 0 refills | Status: DC
Start: 2017-06-10 — End: 2017-10-08

## 2017-06-10 MED ORDER — LIDOCAINE HCL 2 % IJ SOLN
2 % | Freq: Once | INTRAMUSCULAR | Status: AC
Start: 2017-06-10 — End: 2017-06-10
  Administered 2017-06-10: 20:00:00 10 mL via INTRADERMAL

## 2017-06-10 MED ORDER — CEPHALEXIN 500 MG PO CAPS
500 MG | ORAL_CAPSULE | Freq: Four times a day (QID) | ORAL | 0 refills | Status: AC
Start: 2017-06-10 — End: 2017-06-15

## 2017-06-10 MED ORDER — LIDOCAINE HCL 1 % IJ SOLN
1 % | Freq: Once | INTRAMUSCULAR | Status: DC
Start: 2017-06-10 — End: 2017-06-10

## 2017-06-10 MED FILL — SULFAMETHOXAZOLE-TRIMETHOPRIM 800-160 MG PO TABS: 800-160 MG | ORAL | Qty: 1

## 2017-06-10 NOTE — Progress Notes (Signed)
CD progress note dictated---#[id number]

## 2017-06-10 NOTE — Care Coordination-Inpatient (Signed)
Pt discharged, requested bus pass.  Gave one all-day bus pass to RN.  She will give to pt with discharge paperwork. Electronically signed by Merri BrunetteBeth Ayanah Snader, LSW on 06/10/2017 at 3:19 PM

## 2017-06-10 NOTE — Progress Notes (Signed)
Mason Garza:                  Mason Garza, Mason Garza  MEDICAL RECORD NUMBER:          [id number]  ADMISSION DATE:           06/06/2017  DATE OF EVAL:             06/10/2017  ACCOUNT#:                 [id number]  RACE:                     W  SEX:                      M  LOCATION:                 7W  1743A  DATE OF BIRTH:            Jul 10, 1966  AGE:                      51  ADMITTING PHYSICIAN:      Deborra MedinaBryan P O'Connell, MD  ATTENDING PHYSICIAN:      [place] ConstableStephen J Subichin, II, MD  DICTATING PHYSICIAN:      Guadlupe SpanishAlan H. Levada DyShein, MD  PCP:                      CHEMICAL DEPENDENCE PROGRESS NOTE    HISTORY OF PRESENT ILLNESS:  The patient is stable from a  detoxification perspective.  He has completed tramadol regimen for  opiate withdrawal syndrome.  He denies any acute withdrawal  symptomatology, including chills, cramping, joint or muscle pain.  His  appetite is intact.  No nausea, vomiting, or diarrhea.    PHYSICAL EXAMINATION:  Vital signs: Blood pressure 102/65, pulse 60,  respiratory rate 19, and temperature 97.7.   No overt tremors.  He is  not flushed or diaphoretic.  Oriented times four.  No auditory,  tactile, or visual disturbances.    PLAN:  At this point, the patient is stable from a detox perspective.  Disposition per determination of medical team.    I did spend over 51% total time 18 minutes coordinating care,  providing discussion regarding this patient's chemical dependency,  psychiatric and medical history, and discussion of treatment plan.      Guadlupe SpanishAlan H. Levada DyShein, MD    DOD:06/10/2017 12:14 P  AHS/vjw  DOT:06/10/2017 01:38 P  Document Number: [id number]  Job Number: [id number]    cc:   [place] ConstableStephen J Subichin, II, MD        [address]        [place] [id number]

## 2017-06-10 NOTE — Care Coordination-Inpatient (Signed)
PT NOW DISCHARGED TO HOME.  US SHOWED POCKET OF FLUID, GEN SURG COMING TO DO II%D AND THEN PT WILL GO HOME.  DISCUSSED CHECKLIST, DENIES QUESTIONS.  WOULD LIKE BUS PASS TO GET HOME, SW AWARE. Electronically signed by Robyn HaberJoan Jersey Espinoza, RN on 06/10/2017 at 3:15 PM

## 2017-06-10 NOTE — Plan of Care (Signed)
Problem: Pain:  Goal: Control of acute pain  Control of acute pain   Outcome: Met This Shift      Problem: Skin Integrity - Impaired:  Goal: Will show no infection signs and symptoms  Will show no infection signs and symptoms   Outcome: Met This Shift    Goal: Absence of new skin breakdown  Absence of new skin breakdown   Outcome: Met This Shift

## 2017-06-10 NOTE — Other (Signed)
Pt educated on RLE dressing removal/replacement. Pt verbalized understanding.  Reviewed pt discharge documentation. Pharmacy review complete. Awaiting Meds to Beds.

## 2017-06-10 NOTE — Procedures (Signed)
Patient Name: Mason Garza   Medical Record Number: [id number]  Date: 06/10/2017   Time: 3:51 PM   Room/Bed: 1743/1743A  Incision and Drainage Procedure Note  Indication: Abscess    Procedure: The patient was positioned appropriately and the skin over the incision site was prepped with chlorhexidine. Local anesthesia was obtained by infiltration using 2% Lidocaine without epinephrine.  A 2 cm elliptical incision was then made over the apex of the lesion and approximately 3 cc of serous and bloody material was expressed. Loculations were not present. The drainage cavity was then irrigated. Overlying gauze dressing placed. No packing placed d/t superficial nature of wound.    The patient tolerated the procedure well.    Complications: None    Dr. Hessie DienerBender present for the entire procedure.    Ann LionsAntonio Raymie Giammarco, MD  General Surgery, PGY-1  06/10/2017 3:52 PM

## 2017-06-10 NOTE — Discharge Summary (Signed)
[facility] Discharge Summary with   Discharge DayProgress Note and Transition Note        Mason Garza  DOB: July 10, 1966  MRN:  [id number]    ADMIT DATE: 06/06/2017  DISCHARGE DATE:  06/10/2017    PRIMARYCARE PHYSICIAN:  Ellene Route, MD    VISIT STATUS: Observation    CODE STATUS:  Full Code    DISCHARGE DIAGNOSES:    Principal Problem:    Cellulitis and abscess of right lower extremity  Active Problems:    IVDU (intravenous drug user)    Type 2 diabetes mellitus with diabetic neuropathy (HCC)    Tobacco use    Depression    Chronic hepatitis C (HCC)    Severe opioid use disorder (HCC)    Cellulitis of right lower extremity  Resolved Problems:    * No resolved hospital problems. *      HOSPITAL COURSE:    Patient was admitted for right lower extremity cellulitis and failure of outpatient therapy.  He is a known IV drug abuser but states that he has not injected in the area that was infected.  He was having more redness, pain, and drainage from the right lower extremity.  He was started initially on IV vancomycin and the culture showed initially only strep pyogenes.  He was switched to cephalexin.  The following morning MRSA was cultured out as well and Bactrim was added on.  Due to the fact that this was a recurrent infection i.e. ordered an ultrasound of the right lower extremity which did show there was a small abscess of about 1.510.5 cm.  Gen. surgery came by and did a bedside incision and drainage.  From an infectious standpoint the patient had no complications to his hospital stay.    On admission the patient stated that he wanted to get off of heroin.  Addiction medicine was consult did and he was placed on a tramadol taper area did he actually did very well with the tramadol taper and did not require any p.Garza.n. medications the entire time he was here.  From an addiction medicine standpoint he had no complications.      On the day of discharge the patient was asymptomatic.  He was tolerating  p.o. and moving his bowels.  He tolerated the bedside incision and drainage.  He'll be discharged today to home in good condition.    Of note, the patient had previously been seeing Dr. Ellene Route but due to transportation issues he has no showed for many of his appointments.  He says that his transportation issue has not resolved yet and so we set him up with the internal medicine center for follow-up upon discharge.  He was told that he can resume seeing Dr. Olivia Mackie when his transportation issues are resolved.        DAY OF DISCHARGE:  Review of Systems   Constitutional: Negative for chills and fever.   Respiratory: Negative for cough and shortness of breath.    Cardiovascular: Negative for chest pain and leg swelling.   Gastrointestinal: Negative for constipation, diarrhea, nausea and vomiting.   Neurological: Negative for dizziness.       Patient Vitals for the past 24 hrs:   BP Temp Temp src Pulse Resp SpO2   06/10/17 0740 102/65 97.7 F (36.5 C) Temporal 60 19 97 %   06/09/17 1824 (!) 108/55 97.7 F (36.5 C) Temporal 69 16 95 %       Average, Min, and  Max forlast 24 hours Vitals:  TEMPERATURE:  Temp  Avg: 97.7 F (36.5 C)  Min: 97.7 F (36.5 C)  Max: 97.7 F (36.5 C)    RESPIRATIONS RANGE: Resp  Avg: 17.5  Min: 16  Max: 19    PULSE RANGE: Pulse  Avg: 64.5  Min: 60  Max: 69    BLOOD PRESSURE RANGE:  Systolic (24hrs), Avg:105 , Min:102 , Max:108   ; Diastolic (24hrs), Avg:60, Min:55, Max:65      PULSE OXIMETRYRANGE: SpO2  Avg: 96 %  Min: 95 %  Max: 97 %    No intake/output data recorded.    Physical Exam   Constitutional: He is oriented to person, place, and time. He appears well-developed and well-nourished. No distress.   HENT:   Head: Normocephalic and atraumatic.   Cardiovascular: Normal rate, regular rhythm and normal heart sounds.  Exam reveals no gallop and no friction rub.    No murmur heard.  Pulmonary/Chest: Effort normal and breath sounds normal. No respiratory distress. He has no wheezes. He has  no rales.   Musculoskeletal: He exhibits no edema or tenderness.   Neurological: He is alert and oriented to person, place, and time.   Skin: Skin is warm and dry. No erythema (area of erythema now resolved).   Psychiatric: He has a normal mood and affect. His behavior is normal.   Vitals reviewed.      PROCEDURES:  I&D of the RLE    CONSULTANTS:  Gen surgery - Dr. Hessie DienerBender  Addiction med - Dr. Levada DyShein    DISCHARGE MEDICATIONS:     [name], Mason Garza   Home Medication Instructions ZOX:[id number]    Printed on:06/10/17 1624   Medication Information                      buPROPion (WELLBUTRIN XL) 150 MG extended release tablet  Take 1 tablet by mouth every morning             cephALEXin (KEFLEX) 500 MG capsule  Take 1 capsule by mouth 4 times daily for 5 days             naproxen (NAPROSYN) 500 MG tablet  Take 1 tablet by mouth 2 times daily as needed for Pain             sulfamethoxazole-trimethoprim (BACTRIM DS;SEPTRA DS) 800-160 MG per tablet  Take 1 tablet by mouth every 12 hours for 3 days             traZODone (DESYREL) 100 MG tablet  Take 1 tablet by mouth nightly                 DIET: carb controlled    ACTIVITY: resume regular activity    SIGNIFICANT DIAGNOSTIC STUDIES:  Ultrasound RLE    COMPLEXITY OF FOLLOW UP:   []  Moderate Complexity: follow up within 7-14 calendar days ([id number])   [x]  Severe Complexity: follow up within 7 calendar days (99496)    FOLLOW UP TESTING, PENDING RESULTS ORREFERRALS AT TRANSITIONAL CARE VISIT:    []   Yes    [x]   No    DISPOSITION: Home    FACILITY/HOME CARE AGENCY NAME: n/a    Follow up with Internal medicine center scheduled    Notification (telephone encounter) to PCP initiated:   [x]   Yes    []   No    INSTRUCTIONS TO MA/SW: Please call patient on day after discharge (must document patient  contacted within 2  business days of discharge).    FOLLOW UP QUESTIONS FOR MA/SW:  1. Did you get medications filled and takingthem as instructed from discharge?  2. Are you following your  discharge instructions from your hospital stay?  3. Please confirm patient is scheduled for a follow up appointment within the above time frame.    DISCHARGE TIME: > 30 minutes    Electronically signed by [name], MD on 06/10/17 at 4:24 PM

## 2017-06-10 NOTE — Plan of Care (Signed)
Problem: Pain:  Goal: Control of acute pain  Control of acute pain   Outcome: Ongoing      Problem: Skin Integrity - Impaired:  Goal: Will show no infection signs and symptoms  Will show no infection signs and symptoms   Outcome: Met This Shift    Goal: Absence of new skin breakdown  Absence of new skin breakdown   Outcome: Ongoing

## 2017-06-10 NOTE — Discharge Instructions (Signed)
cephalexin  Pronunciation:  sef a LEX in  Brand:  Daxbia, Keflex  What is the most important information I should know about cephalexin?  You should not use this medicine if you are allergic to cephalexin or to similar antibiotics, such as Ceftin, Cefzil, Omnicef, and others. Tell your doctor if you are allergic to any drugs, especially penicillins or other antibiotics.  What is cephalexin?  Cephalexin is a cephalosporin (SEF a low spor in) antibiotic. It works by fighting bacteria in your body.  Cephalexin is used to treat infections caused by bacteria, including upper respiratory infections, ear infections, skin infections, and urinary tract infections.  Cephalexin may also be used for purposes not listed in this medication guide.  What should I discuss with my healthcare provider before taking cephalexin?  Do not use this medicine if you are allergic to cephalexin or to other cephalosporin antibiotics, such as:   cefaclor (Raniclor);   cefadroxil (Duricef);   cefazolin (Ancef);   cefdinir (Omnicef);   cefditoren (Spectracef);   cefpodoxime (Vantin);   cefprozil (Cefzil);   ceftibuten (Cedax);   cefuroxime (Ceftin); or   cephradine (Velosef), and others.  To make sure cephalexin is safe for you, tell your doctor if you have:   an allergy to any drugs (especially penicillins);   kidney disease; or   a history of intestinal problems, such as colitis.  The liquid form of cephalexin may contain sugar. This may affect you if you have diabetes.  Cephalexin is not expected to be harmful to an unborn baby. Tell your doctor if you are pregnant.  Cephalexin can pass into breast milk and may harm a nursing baby. Tell your doctor if you are breast-feeding a baby.  How should I take cephalexin?  Follow all directions on your prescription label. Do not take this medicine in larger or smaller amounts or for longer than recommended.  Do not use cephalexin to treat any condition that has not been checked by your  doctor.  Shake the oral suspension (liquid) well just before you measure a dose. Measure liquid medicine with the dosing syringe provided, or with a special dose-measuring spoon or medicine cup. If you do not have a dose-measuring device, ask your pharmacist for one.  Use this medicine for the full prescribed length of time. Your symptoms may improve before the infection is completely cleared. Skipping doses may also increase your risk of further infection that is resistant to antibiotics. Cephalexin will not treat a viral infection such as the flu or a common cold.  Do not share cephalexin with another person, even if they have the same symptoms you have.  This medication can cause you to have unusual results with certain medical tests. Tell any doctor who treats you that you are using cephalexin.  Store the tablets and capsules at room temperature away from moisture, heat, and light.  Store the liquid medicine in the refrigerator. Throw away any unused liquid after 14 days.  What happens if I miss a dose?  Take the missed dose as soon as you remember. Skip the missed dose if it is almost time for your next scheduled dose. Do not take extra medicine to make up the missed dose.  What happens if I overdose?  Seek emergency medical attention or call the Poison Help line at 1-800-222-1222.  Overdose symptoms may include nausea, vomiting, stomach pain, diarrhea, and blood in your urine.  What should I avoid while taking cephalexin?  Antibiotic medicines can cause diarrhea, which   may be a sign of a new infection. If you have diarrhea that is watery or bloody, call your doctor. Do not use anti-diarrhea medicine unless your doctor tells you to.  What are the possible side effects of cephalexin?  Get emergency medical help if you have signs of an allergic reaction: hives; difficult breathing; swelling of your face, lips, tongue, or throat.  Call your doctor at once if you have:   severe stomach pain, diarrhea that is  watery or bloody;   jaundice (yellowing of the skin or eyes);   easy bruising, unusual bleeding (nose, mouth, vagina, or rectum), purple or red pinpoint spots under your skin;   little or no urination;   agitation, confusion, hallucinations; or   severe skin reaction --fever, sore throat, swelling in your face or tongue, burning in your eyes, skin pain followed by a red or purple skin rash that spreads (especially in the face or upper body) and causes blistering and peeling.  Common side effects may include:   diarrhea;   dizziness, feeling tired;   headache, joint pain; or   vaginal itching or discharge.  This is not a complete list of side effects and others may occur. Call your doctor for medical advice about side effects. You may report side effects to FDA at 1-800-FDA-1088.  What other drugs will affect cephalexin?  Other drugs may interact with cephalexin, including prescription and over-the-counter medicines, vitamins, and herbal products. Tell each of your health care providers about all medicines you use now and any medicine you start or stop using.  Where can I get more information?  Your pharmacist can provide more information about cephalexin.    Remember, keep this and all other medicines out of the reach of children, never share your medicines with others, and use this medication only for the indication prescribed.  Every effort has been made to ensure that the information provided by [employer] ('[employer]') is accurate, up-to-date, and complete, but no guarantee is made to that effect. Drug information contained herein may be time sensitive. [employer] information has been compiled for use by healthcare practitioners and consumers in the [place] and therefore [employer] does not warrant that uses outside of the [place] are appropriate, unless specifically indicated otherwise. [employer]'s drug information does not endorse drugs, diagnose patients or recommend therapy. [employer]'s drug  information is an Investment [employer], corporate to assist licensed healthcare practitioners in caring for their patients and/or to serve consumers viewing this service as a supplement to, and not a substitute for, the expertise, skill, knowledge and judgment of healthcare practitioners. The absence of a warning for a given drug or drug combination in no way should be construed to indicate that the drug or drug combination is safe, effective or appropriate for any given patient. [employer] does not assume any responsibility for any aspect of healthcare administered with the aid of information [employer] provides. The information contained herein is not intended to cover all possible uses, directions, precautions, warnings, drug interactions, allergic reactions, or adverse effects. If you have questions about the drugs you are taking, check with your doctor, nurse or pharmacist.  Copyright [phone] [employer] Version: 9.01. Revision date: 12/04/2015.  Care instructions adapted under license by [facility]. If you have questions about a medical condition or this instruction, always ask your healthcare professional. [employer] disclaims any warranty or liability for your use of this information.    sulfamethoxazole and trimethoprim  Pronunciation:  SUL fa meth OX  a zole and trye METH oh prim  Brand:  Bactrim, Bactrim DS, Septra DS, SMZ-TMP DS  What is the most important information I should know about sulfamethoxazole and trimethoprim?  You should not use this medication if you have severe liver or kidney disease, anemia caused by folic acid deficiency, or a history of low blood platelets caused by taking trimethoprim or any sulfa drug.  What is sulfamethoxazole and trimethoprim?  Sulfamethoxazole and trimethoprim are both antibiotics that treat different types of infection caused by bacteria.  Sulfamethoxazole and trimethoprim is used a combination antibiotic used to treat ear infections, urinary  tract infections, bronchitis, traveler's diarrhea, shigellosis, and Pneumocystis jiroveci pneumonia.  Sulfamethoxazole and trimethoprim may also be used for purposes not listed in this medication guide.  What should I discuss with my healthcare provider before taking sulfamethoxazole and trimethoprim?  You should not use this medication if you are allergic to sulfamethoxazole or trimethoprim, or if you have:   severe liver or kidney disease;   anemia (low red blood cells) caused by folic acid deficiency; or   a history of low blood platelets caused by taking trimethoprim or any sulfa drug.  To make sure sulfamethoxazole and trimethoprim is safe for you, tell your doctor if you have:   kidney or liver disease;   a folic acid deficiency;   asthma or severe allergies;   a thyroid disorder;   HIV or AIDS;   porphyria (a genetic enzyme disorder that causes symptoms affecting the skin or nervous system);   a glucose-6-phosphate dehydrogenase deficiency (G6PD deficiency); or   if you are malnourished.  FDA pregnancy category D. Do not use sulfamethoxazole and trimethoprim if you are pregnant. It could harm the unborn baby. Use effective birth control, and tell your doctor if you become pregnant during treatment.  This medicine can pass into breast milk and may harm a nursing baby. Tell your doctor if you are breast-feeding a baby.  Do not give this medication to a child younger than 2 months old.  Serious side effects may be more likely in older adults, especially those who take other medications such as digoxin or certain diuretics.  How should I take sulfamethoxazole and trimethoprim?  Follow all directions on your prescription label. Do not take this medicine in larger or smaller amounts or for longer than recommended.  Shake the oral suspension (liquid) well just before you measure a dose. Measure the liquid with a special dose-measuring spoon or medicine cup. If you do not have a dose-measuring device, ask  your pharmacist for one.  Use this medication for the full prescribed length of time. Your symptoms may improve before the infection is completely cleared. Skipping doses may also increase your risk of further infection that is resistant to antibiotics. Sulfamethoxazole and trimethoprim will not treat a viral infection such as the common cold or flu.  Drink plenty of fluids to prevent kidney stones while you are taking trimethoprim and sulfamethoxazole.  This medication can cause unusual results with certain medical tests. Tell any doctor who treats you that you are using sulfamethoxazole and trimethoprim.  Store at room temperature away from moisture, heat, and light.  What happens if I miss a dose?  Take the missed dose as soon as you remember. Skip the missed dose if it is almost time for your next scheduled dose. Do not  take extra medicine to make up the missed dose.  What happens if I overdose?  Seek emergency medical attention or call  the Poison Help line at 1-([phone] 043-9368.  What should I avoid while taking sulfamethoxazole and trimethoprim?  Antibiotic medicines can cause diarrhea, which may be a sign of a new infection. If you have diarrhea that is watery or bloody, stop taking this medication and call your doctor. Do not use anti-diarrhea medicine unless your doctor tells you to.  Avoid exposure to sunlight or tanning beds. This medication can make you sunburn more easily. Wear protective clothing and use sunscreen (SPF 30 or higher) when you are outdoors.  What are the possible side effects of sulfamethoxazole and trimethoprim?  Get emergency medical help if you have any of these signs of an allergic reaction: hives; difficult breathing; swelling of your face, lips, tongue, or throat.  Call your doctor at once if you have:   diarrhea that is watery or bloody;   pale skin, feeling light-headed or short of breath, rapid heart rate, trouble concentrating;   sudden weakness or ill feeling, fever, chills,  sore throat, new or worsening cough;   cold or flu symptoms, swollen gums, painful mouth sores, pain when swallowing, skin sores;   low levels of sodium in the body --headache, confusion, slurred speech, severe weakness, vomiting, loss of coordination, feeling unsteady;   liver problems --upper stomach pain, tired feeling, dark urine, clay-colored stools, jaundice (yellowing of the skin or eyes); or   severe skin reaction --fever, sore throat, swelling in your face or tongue, burning in your eyes, skin pain, followed by a red or purple skin rash that spreads (especially in the face or upper body) and causes blistering and peeling.  Common side effects may include:   nausea, vomiting, loss of appetite; or   mild itching or rash.  This is not a complete list of side effects and others may occur. Call your doctor for medical advice about side effects. You may report side effects to FDA at 1-800-FDA-1088.  What other drugs will affect sulfamethoxazole and trimethoprim?  Tell your doctor about all medicines you use, and those you start or stop using during your treatment with sulfamethoxazole and trimethoprim, especially:   leucovorin; or   methotrexate.  This list is not complete. Other drugs may interact with sulfamethoxazole and trimethoprim, including prescription and over-the-counter medicines, vitamins, and herbal products. Not all possible interactions are listed in this medication guide.  Where can I get more information?  Your pharmacist can provide more information about sulfamethoxazole and trimethoprim.    Remember, keep this and all other medicines out of the reach of children, never share your medicines with others, and use this medication only for the indication prescribed.  Every effort has been made to ensure that the information provided by [employer]') is accurate, up-to-date, and complete, but no guarantee is made to that effect. Drug information contained herein may be time  sensitive. [employer] information has been compiled for use by healthcare practitioners and consumers in the [place] and therefore [employer] does not warrant that uses outside of the [place] are appropriate, unless specifically indicated otherwise. [employer]'s drug information does not endorse drugs, diagnose patients or recommend therapy. [employer]'s drug information is an Investment [employer], corporate to assist licensed healthcare practitioners in caring for their patients and/or to serve consumers viewing this service as a supplement to, and not a substitute for, the expertise, skill, knowledge and judgment of healthcare practitioners. The absence of a warning for a given drug or drug combination in no way should be construed to indicate that the  drug or drug combination is safe, effective or appropriate for any given patient. [employer] does not assume any responsibility for any aspect of healthcare administered with the aid of information [employer] provides. The information contained herein is not intended to cover all possible uses, directions, precautions, warnings, drug interactions, allergic reactions, or adverse effects. If you have questions about the drugs you are taking, check with your doctor, nurse or pharmacist.  Copyright [phone] [employer] Version: 8.01. Revision date: 08/01/2012.  Care instructions adapted under license by [facility]. If you have questions about a medical condition or this instruction, always ask your healthcare professional. [employer] disclaims any warranty or liability for your use of this information.

## 2017-06-10 NOTE — Other (Signed)
Pt refused morning lab draw. Physician notified.

## 2017-06-10 NOTE — Other (Unsigned)
Patient Acct Nbr: [id number]   Primary AUTH/CERT:   Primary Insurance Company Name: [employer]  Primary Insurance Plan name: [facility]  Primary Insurance Group Number: [facility]  Primary Insurance Plan Type: Health  Primary Insurance Policy Number: [id number]

## 2017-06-10 NOTE — Discharge Instructions (Signed)
.   Good nutrition is important when healing from an illness, injury, or surgery.  Follow any nutrition recommendations given to you during your hospital stay.   . If you were given an oral nutrition supplement while in the hospital, continue to take this supplement at home.  You can take it with meals, in-between meals, and/or before bedtime. These supplements can be purchased at most local grocery stores, pharmacies, and chain super-stores.   . If you have any questions about your diet or nutrition, call the hospital and ask for the dietitian.      Resume pre-hospital diet

## 2017-06-10 NOTE — Consults (Signed)
Department of General Surgery  Surgical Service 1  Consult  06/10/2017      CHIEF COMPLAINT:    Chief Complaint   Patient presents with   . Leg Swelling     Redness and swelling to RLE. Pt states he was recently treated with antibiotics for it and "now it is coming back and getting red and irritated".      Reason for Admission:  Cellulitis and abscess of right lower extremity [L03.115, L02.415]  Cellulitis of right lower extremity [L03.115]  ADMIT DATE:  06/06/2017    Reason for Consult: I&D abscess    HISTORY OF PRESENT ILLNESS:    Alphia KavaBobby R Dorris is a 51 y.o. male with significant past medical history of IVDU, Hep C, DM2 with neuropathy, and noncompliance who presented 10/20 for cellulitis of his RLE. Was recently seen 10/12 for cellulitis of that same area. I&D done by ED with 1 inch linear incision. Admitted to medicine service. Patient started on antibiotics but left AMA 10/14 and did not continue any antibiotics.    Returned 10/20 with increased swelling, pain, and drainage from the area. Afebrile without leukocytosis. Started on Vancomycin. Cultures from the drainage grew strep pyogenes and MRSA. Switched to keflex and bactrim.    U/S performed 10/24 shows 1.5 x 0.5 x 1.1 cm fluid collection in RLE.    Plan to discharge per primary, consult placed prior to discharge for I&D.      Past Medical History:   Diagnosis Date   . Chronic hepatitis C (HCC) 11/22/2015    active IVDU   . H/O echocardiogram 05/12/11    EF62%, mild LAE   . Neuropathy    . Not immune to hepatitis B virus     need to re-vaccinate   . Psychiatric problem    . Type II or unspecified type diabetes mellitus without mention of complication, not stated as uncontrolled        History reviewed. No pertinent surgical history.    Medications Prior to Admission:   Prior to Admission medications    Medication Sig Start Date End Date Taking? Authorizing Provider   naproxen (NAPROSYN) 500 MG tablet Take 1 tablet by mouth 2 times daily as needed for  Pain 06/10/17  Yes Esaw GrandchildBryan Patrick O'Connell, MD   buPROPion (WELLBUTRIN XL) 150 MG extended release tablet Take 1 tablet by mouth every morning 06/11/17  Yes Esaw GrandchildBryan Patrick O'Connell, MD   traZODone (DESYREL) 100 MG tablet Take 1 tablet by mouth nightly 06/10/17  Yes Esaw GrandchildBryan Patrick O'Connell, MD   cephALEXin [facility](KEFLEX) 500 MG capsule Take 1 capsule by mouth 4 times daily for 3 days 06/10/17 06/13/17 Yes Esaw GrandchildBryan Patrick O'Connell, MD   sulfamethoxazole-trimethoprim (BACTRIM DS;SEPTRA DS) 800-160 MG per tablet Take 1 tablet by mouth every 12 hours for 3 days 06/10/17 06/13/17 Yes Esaw GrandchildBryan Patrick O'Connell, MD       Allergies: Patient has no known allergies.     Social History     Social History   . Marital status: Divorced     Spouse name: N/A   . Number of children: N/A   . Years of education: N/A     Social History Main Topics   . Smoking status: Current Every Day Smoker     Packs/day: 1.00     Years: 30.00   . Smokeless tobacco: Never Used      Comment: nicotene patch requested   . Alcohol use No      Comment: 6 pack per  yr now; former 6-12 pk per day for 10 yrs   . Drug use: Yes     Frequency: 10.0 times per week     Types: Opiates , IV      Comment: heroin IVDU daily   . Sexual activity: Yes     Partners: Female     Other Topics Concern   . None     Social History Narrative    Living with mother now - improved meal intake and assistance with DM care as mother is a former Charity fundraiser        History reviewed. No pertinent family history.    REVIEW OFSYSTEMS:    Review of Systems   Constitutional: Negative for chills and fever.   HENT: Negative for hearing loss and trouble swallowing.    Eyes: Negative for photophobia and visual disturbance.   Respiratory: Negative for chest tightness and shortness of breath.    Cardiovascular: Negative for chest pain and palpitations.   Gastrointestinal: Negative for abdominal distention and abdominal pain.   Genitourinary: Negative for dysuria and urgency.   Musculoskeletal: Negative for back pain  and neck stiffness.   Skin: Positive for rash and wound.   Neurological: Negative for weakness and numbness.        PHYSICAL EXAM:    Vitals:    06/10/17 0740   BP: 102/65   Pulse: 60   Resp: 19   Temp: 97.7 F (36.5 C)   SpO2: 97%     Physical Exam   Constitutional: He is oriented to person, place, and time. He appears well-developed and well-nourished. No distress.   HENT:   Head: Normocephalic and atraumatic.   Eyes: Conjunctivae are normal. No scleral icterus.   Neck: No tracheal deviation present.   Cardiovascular: Normal rate and regular rhythm.    Pulmonary/Chest: Effort normal. No stridor. No respiratory distress. He exhibits no tenderness.   Abdominal: Soft. He exhibits no distension. There is no tenderness.   Musculoskeletal: Normal range of motion. He exhibits no tenderness or deformity.   Neurological: He is alert and oriented to person, place, and time.   Skin: Skin is warm and dry. He is not diaphoretic. There is erythema.   Small 2x3 cm scab on R medial shin. Minimal serous drainage expressed. Minimal surrounding erythema. Non-tender. Minimal fluctuance palpated. No induration.   Psychiatric: He has a normal mood and affect. His behavior is normal.             DATA:  CBC:   Lab Results   Component Value Date    WBC 6.2 06/08/2017    WBC 2 05/02/2014    RBC 4.40 06/08/2017    HGB 12.6 06/08/2017    HCT 38.0 06/08/2017    MCV 86.3 06/08/2017    MCH 28.7 06/08/2017    MCHC 33.3 06/08/2017    RDW 15.0 06/08/2017    PLT 249 06/08/2017    MPV 7.1 06/08/2017     BMP:  Lab Results   Component Value Date    NA 139 06/08/2017    K 4.6 06/08/2017    CL 105 06/08/2017    CO2 27 06/08/2017    BUN 16 06/08/2017    LABALBU 4.2 05/16/2017    CREATININE 0.75 06/08/2017    CALCIUM 9.0 06/08/2017    GFRAA >60 04/27/2014    LABGLOM >60 04/27/2014    GLUCOSE 138 06/08/2017       Body mass index is 23.12 kg/m.  BMI Classification:  Normal Weight (BMI 18.5-24.9)    IMAGING: All relevant imaging reviewed by surgery  team  US Extremity Non Vascular Complete Right    Result Date: 06/10/2017  Patient Name:  LAMBERT, JEANTY MRN:  [id number] FIN:  [id number] ---Ultrasound--- Exam Date/Time         06/10/2017 10:40:14 EDT                            Exam                   US Extremity Non Vascular Complete                 Ordering Physician     Gershon Crane, MD, Jeris Penta                       Accession Number       [id number]                                      CPT4 Codes 78469 (US Extremity Non Vascular Complete) Reason For Exam MASS OR LUMP, LEG Report Indication: Right lower leg cellulitis. Ultrasound of the right lower extremity in the area of the patient's concern shows an approximate 1.5 x 0.5 x 1.1 cm irregular fluid collection. Given the clinical history, this is probably related to phlegmon/developing abscess. No other sonographic abnormalities are visualized. Report Dictated on Workstation: [id number] ---** Final ---** Dictated: 06/10/2017 2:17 pm Dictating Physician: Stann Mainland Signed Date and Time: 06/10/2017 2:19 pm Signed by: Stann Mainland Transcribed Date and Time: 06/10/2017 2:17      ASSESSMENT AND PLAN:    This is a 51 y.o. male with recurrent RLE cellulitis and associated abscess positive for MRSA, strep pyogenes    - Discussed with Dr. Hessie Diener  - I&D performed at bedside   - Patient consented   - 2 cm elliptical incision performed   - Minimal drainage, no purulence, no loculations   - No packing placed d/t superficial nature of wound    - Patient to be discharged on keflex/bactrim per primary  - Reinforced with the patient the importance of finishing course of antibiotics  - Instructions given to change overlying dressing when saturated  - F/u in Dr. Larrie Kass office    Ann Lions, MD  General Surgery, PGY-1  06/10/2017 2:36 PM

## 2017-06-11 LAB — CULTURE, BLOOD 2: Blood Culture, Routine: NO GROWTH

## 2017-06-11 LAB — CULTURE BLOOD #1: Blood Culture, Routine: NO GROWTH

## 2017-06-11 NOTE — Telephone Encounter (Signed)
Discharge Summaries  Date of Service: 06/10/2017 4:24 PM  Mason GrandchildBryan Patrick O'Connell, MD   Internal Medicine      [] Manual[] Template  [] Copied  [facility] Discharge Summary with   Discharge DayProgress Note and Transition Note                  [name]                    DOB: 01/28/1966                      MRN:  [id number]    ADMIT DATE: 06/06/2017                 DISCHARGE DATE:  06/10/2017    PRIMARYCARE PHYSICIAN:  Ellene RouteNaomi Tyree, MD    VISIT STATUS: Observation    CODE STATUS:  Full Code    DISCHARGE DIAGNOSES:    Principal Problem:    Cellulitis and abscess of right lower extremity  Active Problems:    IVDU (intravenous drug user)    Type 2 diabetes mellitus with diabetic neuropathy (HCC)    Tobacco use    Depression    Chronic hepatitis C (HCC)    Severe opioid use disorder (HCC)    Cellulitis of right lower extremity  Resolved Problems:    * No resolved hospital problems. *      HOSPITAL COURSE:    Patient was admitted for right lower extremity cellulitis and failure of outpatient therapy.  He is a known IV drug abuser but states that he has not injected in the area that was infected.  He was having more redness, pain, and drainage from the right lower extremity.  He was started initially on IV vancomycin and the culture showed initially only strep pyogenes.  He was switched to cephalexin.  The following morning MRSA was cultured out as well and Bactrim was added on.  Due to the fact that this was a recurrent infection i.e. ordered an ultrasound of the right lower extremity which did show there was a small abscess of about 1.510.5 cm.  Gen. surgery came by and did a bedside incision and drainage.  From an infectious standpoint the patient had no complications to his hospital stay.    On admission the patient stated that he wanted to get off of heroin.  Addiction medicine was consult did and he was placed on a tramadol taper area did he actually did very well with the tramadol taper  and did not require any p.r.n. medications the entire time he was here.  From an addiction medicine standpoint he had no complications.      On the day of discharge the patient was asymptomatic.  He was tolerating p.o. and moving his bowels.  He tolerated the bedside incision and drainage.  He'll be discharged today to home in good condition.    Of note, the patient had previously been seeing Dr. Ellene RouteNaomi Tyree but due to transportation issues he has no showed for many of his appointments.  He says that his transportation issue has not resolved yet and so we set him up with the internal medicine center for follow-up upon discharge.  He was told that he can resume seeing Dr. [name] when his transportation issues are resolved.        DAY OF DISCHARGE:  Review of Systems   Constitutional: Negative for chills and fever.   Respiratory: Negative for cough and  shortness of breath.    Cardiovascular: Negative for chest pain and leg swelling.   Gastrointestinal: Negative for constipation, diarrhea, nausea and vomiting.   Neurological: Negative for dizziness.       Patient Vitals for the past 24 hrs:   BP Temp Temp src Pulse Resp SpO2   06/10/17 0740 102/65 97.7 F (36.5 C) Temporal 60 19 97 %   06/09/17 1824 (!) 108/55 97.7 F (36.5 C) Temporal 69 16 95 %       Average, Min, and Max forlast 24 hours Vitals:  TEMPERATURE:  Temp  Avg: 97.7 F (36.5 C)  Min: 97.7 F (36.5 C)  Max: 97.7 F (36.5 C)    RESPIRATIONS RANGE: Resp  Avg: 17.5  Min: 16  Max: 19    PULSE RANGE: Pulse  Avg: 64.5  Min: 60  Max: 69    BLOOD PRESSURE RANGE:  Systolic (24hrs), Avg:105 , Min:102 , Max:108   ; Diastolic (24hrs), Avg:60, Min:55, Max:65      PULSE OXIMETRYRANGE: SpO2  Avg: 96 %  Min: 95 %  Max: 97 %    No intake/output data recorded.    Physical Exam   Constitutional: He is oriented to person, place, and time. He appears well-developed and well-nourished. No distress.   HENT:   Head: Normocephalic and atraumatic.    Cardiovascular: Normal rate, regular rhythm and normal heart sounds.  Exam reveals no gallop and no friction rub.    No murmur heard.  Pulmonary/Chest: Effort normal and breath sounds normal. No respiratory distress. He has no wheezes. He has no rales.   Musculoskeletal: He exhibits no edema or tenderness.   Neurological: He is alert and oriented to person, place, and time.   Skin: Skin is warm and dry. No erythema (area of erythema now resolved).   Psychiatric: He has a normal mood and affect. His behavior is normal.   Vitals reviewed.      PROCEDURES:  I&D of the RLE    CONSULTANTS:  Gen surgery - Dr. Hessie Diener  Addiction med - Dr. Levada Dy                DISCHARGE MEDICATIONS:     Inocencio, Roy   Home Medication Instructions UJW:[id number]    Printed on:06/10/17 1624   Medication Information                      buPROPion (WELLBUTRIN XL) 150 MG extended release tablet  Take 1 tablet by mouth every morning             cephALEXin (KEFLEX) 500 MG capsule  Take 1 capsule by mouth 4 times daily for 5 days             naproxen (NAPROSYN) 500 MG tablet  Take 1 tablet by mouth 2 times daily as needed for Pain             sulfamethoxazole-trimethoprim (BACTRIM DS;SEPTRA DS) 800-160 MG per tablet  Take 1 tablet by mouth every 12 hours for 3 days             traZODone (DESYREL) 100 MG tablet  Take 1 tablet by mouth nightly                 DIET: carb controlled    ACTIVITY: resume regular activity    SIGNIFICANT DIAGNOSTIC STUDIES:  Ultrasound RLE    COMPLEXITY OF FOLLOW UP:   []  Moderate Complexity: follow up within 7-14 calendar  days [phone])   [x]  Severe Complexity: follow up within 7 calendar days ([id number])    FOLLOW UP TESTING, PENDING RESULTS ORREFERRALS AT TRANSITIONAL CARE VISIT:    []   Yes    [x]   No    DISPOSITION: Home    FACILITY/HOME CARE AGENCY NAME: n/a    Follow up with Internal medicine center scheduled    Notification (telephone  encounter) to PCP initiated:   [x]   Yes    []   No    INSTRUCTIONS TO MA/SW: Please call patient on day after discharge (must document patient  contacted within 2 business days of discharge).    FOLLOW UP QUESTIONS FOR MA/SW:  1. Did you get medications filled and takingthem as instructed from discharge?  2. Are you following your discharge instructions from your hospital stay?  3. Please confirm patient is scheduled for a follow up appointment within the above time frame.    DISCHARGE TIME: > 30 minutes    Electronically signed by Mason Grandchild, MD on 06/10/17 at 4:24 PM

## 2017-06-17 ENCOUNTER — Encounter: Attending: Student in an Organized Health Care Education/Training Program | Primary: Internal Medicine

## 2017-10-05 ENCOUNTER — Inpatient Hospital Stay
Admission: EM | Admit: 2017-10-05 | Discharge: 2017-10-08 | Disposition: A | Discharge status: Home / Self Care | Payer: MEDICAID | Admitting: Internal Medicine

## 2017-10-05 DIAGNOSIS — L0291 Cutaneous abscess, unspecified: Secondary | ICD-10-CM

## 2017-10-05 LAB — CBC WITH AUTO DIFFERENTIAL
Absolute Baso #: 0.1 10*3/uL (ref 0.0–0.2)
Absolute Eos #: 0 10*3/uL (ref 0.0–0.5)
Absolute Lymph #: 2.1 10*3/uL (ref 1.0–4.3)
Absolute Mono #: 0.8 10*3/uL (ref 0.0–0.8)
Absolute Neut #: 5.5 10*3/uL (ref 1.8–7.0)
Basophils: 1.2 % (ref 0.0–2.0)
Eosinophils: 0 % — ABNORMAL LOW (ref 1.0–6.0)
Granulocytes %: 64.7 % (ref 40.0–80.0)
Hematocrit: 36.8 % — ABNORMAL LOW (ref 40.0–52.0)
Hemoglobin: 12.2 g/dL — ABNORMAL LOW (ref 13.0–18.0)
Lymphocyte %: 25.2 % (ref 20.0–40.0)
MCH: 27.2 pg (ref 26.0–34.0)
MCHC: 33.2 % (ref 32.0–36.0)
MCV: 82 fL (ref 80.0–98.0)
MPV: 6.7 fL — ABNORMAL LOW (ref 7.4–10.4)
Monocytes: 8.9 % (ref 2.0–10.0)
Platelets: 253 10*3/uL (ref 140–440)
RBC: 4.48 10*6/uL (ref 4.40–5.90)
RDW: 14.3 % (ref 11.5–14.5)
WBC: 8.5 10*3/uL (ref 3.6–10.7)

## 2017-10-05 LAB — COMPREHENSIVE METABOLIC PANEL
ALT: 29 U/L (ref 13–69)
AST: 26 U/L (ref 15–46)
Albumin,Serum: 3.8 g/dL (ref 3.5–5.0)
Alkaline Phosphatase: 118 U/L (ref 38–126)
Anion Gap: 8 NA
BUN: 19 mg/dL (ref 7–20)
CO2: 25 mmol/L (ref 22–30)
Calcium: 9 mg/dL (ref 8.4–10.4)
Chloride: 103 mmol/L (ref 98–107)
Creatinine: 0.55 mg/dL (ref 0.52–1.25)
EGFR IF NonAfrican American: >60 mL/min (ref >60)
Glucose: 188 mg/dL — ABNORMAL HIGH (ref 70–100)
Potassium: 4.2 mmol/L (ref 3.5–5.1)
Sodium: 137 mmol/L (ref 135–145)
Total Bilirubin: 0.5 mg/dL (ref 0.2–1.3)
Total Protein: 7.5 g/dL (ref 6.3–8.2)
eGFR African American: >60 mL/min (ref >60)

## 2017-10-05 LAB — PROTIME-INR
INR: 0.98 NA (ref 0.90–1.10)
Protime: 9.9 s (ref 9.0–12.0)

## 2017-10-05 LAB — POCT GLUCOSE: POC Glucose: 436 mg/dL — ABNORMAL HIGH (ref 70–100)

## 2017-10-05 LAB — LACTIC ACID: Lactic Acid: 1 mmol/L (ref 0.7–2.0)

## 2017-10-05 MED ORDER — OXYCODONE HCL 5 MG PO TABS
5 MG | Freq: Four times a day (QID) | ORAL | Status: DC | PRN
Start: 2017-10-05 — End: 2017-10-05

## 2017-10-05 MED ORDER — GLUCAGON HCL RDNA (DIAGNOSTIC) 1 MG IJ SOLR
1 MG | INTRAMUSCULAR | Status: DC | PRN
Start: 2017-10-05 — End: 2017-10-08

## 2017-10-05 MED ORDER — FAMOTIDINE 20 MG/2ML IV SOLN
20 MG/2ML | Freq: Once | INTRAVENOUS | Status: AC
Start: 2017-10-05 — End: 2017-10-05
  Administered 2017-10-05: 16:00:00 40 mg via INTRAVENOUS

## 2017-10-05 MED ORDER — METHYLPREDNISOLONE SODIUM SUCC 125 MG IJ SOLR
125 MG | Freq: Once | INTRAMUSCULAR | Status: AC
Start: 2017-10-05 — End: 2017-10-05
  Administered 2017-10-05: 16:00:00 125 mg via INTRAVENOUS

## 2017-10-05 MED ORDER — POLYETHYLENE GLYCOL 3350 17 G PO PACK
17 g | Freq: Every day | ORAL | Status: DC | PRN
Start: 2017-10-05 — End: 2017-10-08

## 2017-10-05 MED ORDER — ONDANSETRON HCL 4 MG/2ML IJ SOLN
4 MG/2ML | Freq: Four times a day (QID) | INTRAMUSCULAR | Status: DC | PRN
Start: 2017-10-05 — End: 2017-10-08
  Administered 2017-10-06: 10:00:00 4 mg via INTRAVENOUS

## 2017-10-05 MED ORDER — NICOTINE 14 MG/24HR TD PT24
14 MG/24HR | Freq: Every day | TRANSDERMAL | Status: DC
Start: 2017-10-05 — End: 2017-10-08
  Administered 2017-10-05 – 2017-10-08 (×4): 1 via TRANSDERMAL

## 2017-10-05 MED ORDER — NORMAL SALINE FLUSH 0.9 % IV SOLN
0.9 % | Freq: Two times a day (BID) | INTRAVENOUS | Status: DC
Start: 2017-10-05 — End: 2017-10-08
  Administered 2017-10-06 – 2017-10-08 (×5): 10 mL via INTRAVENOUS

## 2017-10-05 MED ORDER — DEXTROSE 5 % IV SOLN
5 % | Freq: Once | INTRAVENOUS | Status: AC
Start: 2017-10-05 — End: 2017-10-05
  Administered 2017-10-05: 14:00:00 1000 mg via INTRAVENOUS

## 2017-10-05 MED ORDER — GLUCOSE 40 % PO GEL
40 % | ORAL | Status: DC | PRN
Start: 2017-10-05 — End: 2017-10-08

## 2017-10-05 MED ORDER — HYDROMORPHONE HCL 1 MG/ML IJ SOLN
1 MG/ML | INTRAMUSCULAR | Status: DC | PRN
Start: 2017-10-05 — End: 2017-10-08
  Administered 2017-10-05 – 2017-10-08 (×8): 1 mg via INTRAVENOUS

## 2017-10-05 MED ORDER — OXYCODONE HCL 5 MG PO TABS
5 MG | Freq: Four times a day (QID) | ORAL | Status: DC | PRN
Start: 2017-10-05 — End: 2017-10-08

## 2017-10-05 MED ORDER — MORPHINE SULFATE 4 MG/ML IJ SOLN
4 MG/ML | Freq: Once | INTRAMUSCULAR | Status: AC
Start: 2017-10-05 — End: 2017-10-05
  Administered 2017-10-05: 14:00:00 4 mg via INTRAVENOUS

## 2017-10-05 MED ORDER — NORMAL SALINE FLUSH 0.9 % IV SOLN
0.9 % | INTRAVENOUS | Status: DC | PRN
Start: 2017-10-05 — End: 2017-10-08

## 2017-10-05 MED ORDER — DOCUSATE SODIUM 100 MG PO CAPS
100 MG | Freq: Every day | ORAL | Status: DC
Start: 2017-10-05 — End: 2017-10-08

## 2017-10-05 MED ORDER — DEXTROSE 5 % IV SOLN (MINI-BAG)
5 % | Freq: Three times a day (TID) | INTRAVENOUS | Status: DC
Start: 2017-10-05 — End: 2017-10-05

## 2017-10-05 MED ORDER — ENOXAPARIN SODIUM 40 MG/0.4ML SC SOLN
40 MG/0.4ML | Freq: Every day | SUBCUTANEOUS | Status: DC
Start: 2017-10-05 — End: 2017-10-08
  Administered 2017-10-05 – 2017-10-07 (×2): 40 mg via SUBCUTANEOUS

## 2017-10-05 MED ORDER — VANCOMYCIN HCL IN DEXTROSE 1-5 GM/200ML-% IV SOLN
1-5 GM/200ML-% | Freq: Two times a day (BID) | INTRAVENOUS | Status: DC
Start: 2017-10-05 — End: 2017-10-05

## 2017-10-05 MED ORDER — DEXTROSE 50 % IV SOLN
50 % | INTRAVENOUS | Status: DC | PRN
Start: 2017-10-05 — End: 2017-10-08

## 2017-10-05 MED ORDER — DEXTROSE 5 % IV SOLN
5 % | Freq: Two times a day (BID) | INTRAVENOUS | Status: DC
Start: 2017-10-05 — End: 2017-10-07
  Administered 2017-10-06 – 2017-10-07 (×4): 1250 mg via INTRAVENOUS

## 2017-10-05 MED ORDER — INSULIN LISPRO 100 UNIT/ML SC SOLN
100 UNIT/ML | Freq: Every evening | SUBCUTANEOUS | Status: DC
Start: 2017-10-05 — End: 2017-10-08
  Administered 2017-10-06 – 2017-10-08 (×3): 2 [IU] via SUBCUTANEOUS

## 2017-10-05 MED ORDER — ACETAMINOPHEN 500 MG PO TABS
500 MG | Freq: Three times a day (TID) | ORAL | Status: DC
Start: 2017-10-05 — End: 2017-10-08
  Administered 2017-10-05 – 2017-10-08 (×9): 1000 mg via ORAL

## 2017-10-05 MED ORDER — INSULIN LISPRO 100 UNIT/ML SC SOLN
100 UNIT/ML | Freq: Three times a day (TID) | SUBCUTANEOUS | Status: DC
Start: 2017-10-05 — End: 2017-10-08
  Administered 2017-10-05: 6 [IU] via SUBCUTANEOUS
  Administered 2017-10-06: 15:00:00 3 [IU] via SUBCUTANEOUS
  Administered 2017-10-06: 18:00:00 4 [IU] via SUBCUTANEOUS
  Administered 2017-10-06: 21:00:00 6 [IU] via SUBCUTANEOUS
  Administered 2017-10-07: 23:00:00 2 [IU] via SUBCUTANEOUS
  Administered 2017-10-07: 19:00:00 3 [IU] via SUBCUTANEOUS
  Administered 2017-10-07 – 2017-10-08 (×3): 2 [IU] via SUBCUTANEOUS

## 2017-10-05 MED ORDER — DIPHENHYDRAMINE HCL 50 MG/ML IJ SOLN
50 MG/ML | Freq: Once | INTRAMUSCULAR | Status: AC
Start: 2017-10-05 — End: 2017-10-05
  Administered 2017-10-05: 16:00:00 25 mg via INTRAVENOUS

## 2017-10-05 MED ORDER — HYDROMORPHONE HCL 1 MG/ML IJ SOLN
1 MG/ML | Freq: Once | INTRAMUSCULAR | Status: AC
Start: 2017-10-05 — End: 2017-10-05
  Administered 2017-10-05: 16:00:00 1 mg via INTRAVENOUS

## 2017-10-05 MED ORDER — LIDOCAINE HCL 1 % IJ SOLN
1 % | Freq: Once | INTRAMUSCULAR | Status: DC
Start: 2017-10-05 — End: 2017-10-08

## 2017-10-05 MED ORDER — DEXTROSE 5 % IV SOLN
5 % | INTRAVENOUS | Status: DC | PRN
Start: 2017-10-05 — End: 2017-10-08

## 2017-10-05 MED ORDER — OXYCODONE HCL 5 MG PO TABS
5 MG | ORAL | Status: DC | PRN
Start: 2017-10-05 — End: 2017-10-08
  Administered 2017-10-06 – 2017-10-08 (×8): 10 mg via ORAL

## 2017-10-05 MED ORDER — KETOROLAC TROMETHAMINE 15 MG/ML IJ SOLN
15 MG/ML | Freq: Four times a day (QID) | INTRAMUSCULAR | Status: AC
Start: 2017-10-05 — End: 2017-10-06
  Administered 2017-10-05 – 2017-10-06 (×4): 15 mg via INTRAVENOUS

## 2017-10-05 MED ORDER — CEFEPIME HCL 2 G IJ SOLR
2 g | Freq: Two times a day (BID) | INTRAMUSCULAR | Status: DC
Start: 2017-10-05 — End: 2017-10-07
  Administered 2017-10-06 – 2017-10-07 (×4): 2 g via INTRAVENOUS

## 2017-10-05 MED FILL — VANCOMYCIN HCL 1 G IV SOLR: 1 g | INTRAVENOUS | Qty: 1000

## 2017-10-05 MED FILL — NICOTINE 14 MG/24HR TD PT24: 14 mg/(24.h) | TRANSDERMAL | Qty: 1

## 2017-10-05 MED FILL — DOCUSATE SODIUM 100 MG PO CAPS: 100 mg | ORAL | Qty: 1

## 2017-10-05 MED FILL — ENOXAPARIN SODIUM 40 MG/0.4ML SC SOLN: 40 MG/0.4ML | SUBCUTANEOUS | Qty: 0.4

## 2017-10-05 MED FILL — DIPHENHYDRAMINE HCL 50 MG/ML IJ SOLN: 50 mg/mL | INTRAMUSCULAR | Qty: 1

## 2017-10-05 MED FILL — MORPHINE SULFATE 4 MG/ML IJ SOLN: 4 mg/mL | INTRAMUSCULAR | Qty: 1

## 2017-10-05 MED FILL — VANCOMYCIN HCL 10 G IV SOLR: 10 g | INTRAVENOUS | Qty: 1250

## 2017-10-05 MED FILL — METHYLPREDNISOLONE SODIUM SUCC 125 MG IJ SOLR: 125 mg | INTRAMUSCULAR | Qty: 125

## 2017-10-05 MED FILL — KETOROLAC TROMETHAMINE 15 MG/ML IJ SOLN: 15 mg/mL | INTRAMUSCULAR | Qty: 1

## 2017-10-05 MED FILL — HYDROMORPHONE HCL 1 MG/ML IJ SOLN: 1 mg/mL | INTRAMUSCULAR | Qty: 1

## 2017-10-05 MED FILL — FAMOTIDINE 20 MG/2ML IV SOLN: 20 MG/2ML | INTRAVENOUS | Qty: 4

## 2017-10-05 MED FILL — MAPAP 500 MG PO TABS: 500 mg | ORAL | Qty: 2

## 2017-10-05 NOTE — Progress Notes (Signed)
no further treatment

## 2017-10-05 NOTE — ED Notes (Signed)
Pt c/o a "hive" to right upper leg with itching. Also c/o itching to left wrist. No c/o shortness of breath or lip swelling. No c/o difficulty swallowing. Dr. Kerry HoughSkobeloff to bedside for exam. Resp even and non-labored. NAD noted.     Sunny SchleinSusan R. Linwood Gullikson, RN  10/05/17 1031

## 2017-10-05 NOTE — ED Notes (Signed)
Physician's again called for transport and 30 -45 minute ETA. Previous ambulance had to be diverted for critical pt transport.     Sunny SchleinSusan R. Rubena Roseman, RN  10/05/17 [phone]

## 2017-10-05 NOTE — ED Notes (Signed)
IV attempted x 3 per Alycia Rossettiyan, paramedic and attempted x 2 by E. Okey Dupreose, Charity fundraiserN.     Sunny SchleinSusan R. Majorie Santee, RN  10/05/17 [phone]

## 2017-10-05 NOTE — Consults (Signed)
Pharmacy Managed Vancomycin Dosing Service Consult Note  Consult Date: 10/05/17 Consulted By: Robley FriesAndrea L Hobson  Room:1704/170401  Patient Name: Mason KavaBobby R Below  MRN: [id number]     Allergies: Patient has no known allergies.  Age: 52 y.o. Sex: male   Ht: Height: 5\' 11"  (180.3 cm)  TBW: Weight: 175 lb (79.4 kg)   BMI: Body mass index is 24.41 kg/m.  DW: 79.4 kg  Calculated CrCl: >80 mL/min    Lab Results   Component Value Date    CREATININE 0.55 10/05/2017    CREATININE 0.75 06/08/2017    CREATININE 0.71 06/07/2017    BUN 19 10/05/2017    BUN 16 06/08/2017    BUN 14 06/07/2017    WBC 8.5 10/05/2017    WBC 6.2 06/08/2017    WBC 5.4 06/07/2017       Infectious Diagnosis: Cellulitis (goal trough = 10-15 mcg/mL)  Vancomycin 1000 mg x1    Assessment/Plan:  Start Vancomycin 1250 mg Q 12 hours based on patient age, weight, renal function, and infectious diagnosis (~15 mg/kg). Will assess trough prior to fourth dose and adjust as appropriate. Will follow renal function closely.    Thank you for this consult. Please page/call with questions.  Date: 10/05/17 Time: 2:49 PM      _______________________________________  Name: Eugenie FillerAmanda Soffia Doshier, PharmD  Phone: [phone]

## 2017-10-05 NOTE — ED Notes (Signed)
[place], [place].  [phone]. He is aware may be a while for transport.     Sunny SchleinSusan R. Dominick Morella, RN  10/05/17 [phone]

## 2017-10-05 NOTE — ED Triage Notes (Signed)
Ptc/o multiple abscesses. Large abscess to left forearm amd right wrist. Has multiple other smaller abscesses to arms, legs and top of his head. Pt states injects heroin and uses methamphetamines.

## 2017-10-05 NOTE — H&P (Signed)
[facility] Initial History and Physical       Mason Garza  DOB:  12/15/1965(52 y.o.)  MRN:  [id number]    Date: 10/05/17      Subjective:      ChiefComplaint: Skin abscesses    HPI  Patient is a 52 y/oM with PMHx of polysubstance drug abuse, DMII with neuropathy, chronic hep C, and MRSA cellulitis/abscess who presents with complaints of multiple skin abscesses on arms, legs, and scalp. Patient admits to noticing them for several weeks and expressing some purulent drainage, but they have subsequently scabbed over. He endorses pain and tenderness to a fluctuant left forearm abscess that was at a site of IV injection. Last heroin usage on Saturday. He endorses subjective fevers and chills at home for the past few days. He denies any n/v, CP, SOB, cough, abdominal pain, or dysuria. He also admits to tobacco and marijuana use as well as amphetamines. He denies any alcohol use. He lives at home with his brother and is not currently working.      In the ED VSS, lab work up was unremarkable with normal WBC. BCx were drawn and patient was given a dose of vancomycin and admitted for further management.     Of note, he was admitted for a right lower leg abscess in 05/2017 and treated with bedside I&D as well as IV vanc which was switched to keflex and bactrim was added as he was MRSA positive. He is noncompliant with follow up appointments and states he hasn't taken any prescribed medications in awhile.     Past Medical History:   Diagnosis Date   . Chronic hepatitis C (HCC) 11/22/2015    active IVDU   . H/O echocardiogram 05/12/11    EF62%, mild LAE   . Neuropathy    . Not immune to hepatitis B virus     need to re-vaccinate   . Psychiatric problem    . Type II or unspecified type diabetes mellitus without mention of complication, not stated as uncontrolled      History reviewed. No pertinent surgical history.  History reviewed. No pertinent family history.    Social History     Social History   . Marital  status: Divorced     Spouse name: N/A   . Number of children: N/A   . Years of education: N/A     Occupational History   . Not on file.     Social History Main Topics   . Smoking status: Current Every Day Smoker     Packs/day: 1.00     Years: 30.00   . Smokeless tobacco: Never Used      Comment: nicotene patch requested   . Alcohol use No      Comment: 6 pack per yr now; former 6-12 pk per day for 10 yrs   . Drug use: Yes     Frequency: 10.0 times per week     Types: Opiates , IV, Methamphetamines      Comment: heroin IVDU daily   . Sexual activity: Yes     Partners: Female     Other Topics Concern   . Not on file     Social History Narrative    Living with mother now - improved meal intake and assistance with DM care as mother is a former Char[employer] fundraiser       No Known Allergies    Prior to Admission medications    Medication Sig Start Date  End Date Taking? Authorizing Provider   naproxen (NAPROSYN) 500 MG tablet Take 1 tablet by mouth 2 times daily as needed for Pain 06/10/17  Yes Esaw GrandchildBryan Patrick O'Connell, MD   buPROPion (WELLBUTRIN XL) 150 MG extended release tablet Take 1 tablet by mouth every morning 06/11/17   Esaw GrandchildBryan Patrick O'Connell, MD       Review of Systems   Constitutional: Positive for chills, fatigue and fever.   HENT: Negative for congestion and sore throat.    Eyes: Positive for visual disturbance (blurry vision). Negative for pain.   Respiratory: Negative for cough and shortness of breath.    Cardiovascular: Negative for chest pain, palpitations and leg swelling.   Gastrointestinal: Negative for abdominal pain, constipation, diarrhea, nausea and vomiting.   Genitourinary: Negative for dysuria, frequency and hematuria.   Musculoskeletal: Negative for myalgias.   Skin: Positive for color change. Negative for rash.        Multiple skin abscesses   Neurological: Negative for dizziness and headaches.        Neuropathy   Hematological: Does not bruise/bleed easily.   Psychiatric/Behavioral: The patient is  nervous/anxious.        Objective:        Vitals:    10/05/17 0751 10/05/17 1043 10/05/17 1125 10/05/17 1249   BP: 124/82 107/65 118/72 112/70   Pulse: 81 71 69 70   Resp: 20 22 20 16    Temp: 98.1 F (36.7 C)  97.5 F (36.4 C)    TempSrc: Oral  Oral    SpO2: 100% 99% 100% 98%   Weight: 175 lb (79.4 kg)      Height: 5\' 11"  (1.803 m)        Physical Exam   Constitutional: He is oriented to person, place, and time. No distress.   WM resting in bed, moderately ill appearing, NAD   HENT:   Head: Normocephalic and atraumatic.   Right Ear: External ear normal.   Left Ear: External ear normal.   Nose: Nose normal.   Mouth/Throat: Oropharynx is clear and moist.   Poor dentition   Eyes: Pupils are equal, round, and reactive to light. Conjunctivae are normal. Right eye exhibits no discharge. Left eye exhibits no discharge. No scleral icterus.   Neck: Neck supple. No thyromegaly present.   Cardiovascular: Normal rate, regular rhythm, normal heart sounds and intact distal pulses.    No murmur heard.  Pulmonary/Chest: Effort normal and breath sounds normal. No respiratory distress. He has no wheezes.   Abdominal: Soft. Bowel sounds are normal. He exhibits no distension and no mass (no hernia). There is no tenderness.   Musculoskeletal: He exhibits no edema (in lower extremities b/l).   Lymphadenopathy:     He has no cervical adenopathy.   Neurological: He is alert and oriented to person, place, and time. No cranial nerve deficit.   Skin: Skin is warm and dry. He is not diaphoretic. There is erythema.   Multiple skin wounds.  Left medial ventral forearm -raised 3x4 cm, fluctuant erythematous skin abscess with no notable drainage, surrounding induration and warmth  Right medial wrist, right elbow, scalp wounds with white scabbed over tissue with surrounding erythema  Scattered right knee small abscesses, left lateral lower leg abscess --TTP of all skin wounds   Psychiatric: His behavior is normal. His affect is blunt.   Soft  spoken   Vitals reviewed.      Lab Results   Component Value Date    NA 137 10/05/2017  K 4.2 10/05/2017    CL 103 10/05/2017    CO2 25 10/05/2017    BUN 19 10/05/2017    CREATININE 0.55 10/05/2017    GLUCOSE 188 (H) 10/05/2017    CALCIUM 9.0 10/05/2017    PROT 7.5 10/05/2017    LABALBU 3.8 10/05/2017    BILITOT 0.5 10/05/2017    ALKPHOS 118 10/05/2017    AST 26 10/05/2017    ALT 29 10/05/2017    LABGLOM >60 04/27/2014    GFRAA >60 04/27/2014    AGRATIO 1.0 04/27/2014    GLOB 3.4 04/27/2014       Lab Results   Component Value Date    WBC 8.5 10/05/2017    HGB 12.2 (L) 10/05/2017    HCT 36.8 (L) 10/05/2017    MCV 82.0 10/05/2017    PLT 253 10/05/2017     Additional results since admission have been reviewed.    Assessment and Plan:      Active Problems:    IVDU (intravenous drug user)    MRSA infection    Polysubstance abuse (HCC)    Cellulitis of left upper extremity  Resolved Problems:    * No resolved hospital problems. *      1. Cellulitis and abscess of left arm  -multiple sites of skin wounds/abscesses, no current drainage to culture. No leukocytosis, pt afebrile.   -Previously MRSA +, s/p IV vanc in Ed, continue IV Vanc for MRSA coverage -->likely transition to po abx pending cultures, but pt with poor medication complaince  -BCx pending, consult gen surg for I&D/wound cultures   -continue  -pain control with scheduled toradol and tylenol with PRN oxycodone     2. Polysubstance abuse   -IV heroin user, admits to marijuana and remote amphetamine usage  -check urine tox   -ADM previously involved in care, pt agreeable to being seen again, appreciate recs   -recent echo 05/2017 with no evidence of endocarditis     3. Chronic Hep C infection  -not treated due to poor follow up compliance and continuing IVDA   -LFTs WNL    4. DM2 with neuropathy   -last A1C 6.5 in 05/2017, patient not any home meds  -will check A1C and monitor BS with SSI PRN    5. Disposition  -Gen surg, ADM consults, IV vanc for now pending  cultures     DVT Prophylaxis: lovenox 40 q 24hr -creatinine clearance >30  BMI Classification: Body mass index is 24.41 kg/m. normal BMI 18.5-24.9  Disposition: await consultant recommendations and awaitclinical improvement    I spent over 51% of total time providing counseling or in coordination of care: > NA   discussed with nurse, discussed with Dr. Gershon Crane, patient and family updated, Ipersonally examined the patient and I personally reviewed chart, data, labs radiology reports    6AM-6PM please page: [phone], Hulen Luster, PA-C  Electronically signed by Robley Fries, PA-C on 10/05/17 at 2:09 PM    6PM-6AM please page:  [facility] Internal Medicine

## 2017-10-05 NOTE — Consults (Signed)
Department of General Surgery Consult    PATIENT NAME: Mason Garza   DATE OF BIRTH: 12/03/1965    ADMISSION DATE: 10/05/2017  1:13 PM      TODAY'S DATE: 10/05/2017    Reason for Consult:  Multiple abscesses    HISTORY OF PRESENT ILLNESS:              The patient is a 52 y.o. male with a history of polysubstance abuse and Hepatitis C, who presents with multiple abscesses. Some of these abscesses have been present for weeks, and have partially opened and scabbed over, however they still express purulent material. A new one on his left forearm has become large and painful without draining spontaneously. He last used Heroin on Saturday, but admits to using tobacco, marijuana and amphetamines as well. He had a bedside I&D of a leg abscess in October that grew MRSA.     Past Medical History:        Diagnosis Date   . Chronic hepatitis C (HCC) 11/22/2015    active IVDU   . H/O echocardiogram 05/12/11    EF62%, mild LAE   . Neuropathy    . Not immune to hepatitis B virus     need to re-vaccinate   . Psychiatric problem    . Type II or unspecified type diabetes mellitus without mention of complication, not stated as uncontrolled        Past Surgical History:    History reviewed. No pertinent surgical history.    Current Medications:   Current Facility-Administered Medications: sodium chloride flush 0.9 % injection 10 mL, 10 mL, Intravenous, 2 times per day  sodium chloride flush 0.9 % injection 10 mL, 10 mL, Intravenous, PRN  enoxaparin (LOVENOX) injection 40 mg, 40 mg, Subcutaneous, Daily  ondansetron (ZOFRAN) injection 4 mg, 4 mg, Intravenous, Q6H PRN  nicotine (NICODERM CQ) 14 MG/24HR 1 patch, 1 patch, Transdermal, Daily  ketorolac (TORADOL) injection 15 mg, 15 mg, Intravenous, Q6H  acetaminophen (TYLENOL) tablet 1,000 mg, 1,000 mg, Oral, 3 times per day  insulin lispro (HUMALOG) injection vial 0-6 Units, 0-6 Units, Subcutaneous, TID WC  insulin lispro (HUMALOG) injection vial 0-3 Units, 0-3 Units, Subcutaneous,  Nightly  docusate sodium (COLACE) capsule 100 mg, 100 mg, Oral, Daily  polyethylene glycol (GLYCOLAX) packet 17 g, 17 g, Oral, Daily PRN  glucose (GLUTOSE) 40 % oral gel 15 g, 15 g, Oral, PRN  dextrose 50 % solution 12.5 g, 12.5 g, Intravenous, PRN  glucagon (rDNA) injection 1 mg, 1 mg, Intramuscular, PRN  dextrose 5 % solution, 100 mL/hr, Intravenous, PRN  vancomycin (VANCOCIN) 1,250 mg in dextrose 5 % 250 mL IVPB, 1,250 mg, Intravenous, Q12H  oxyCODONE (ROXICODONE) immediate release tablet 5 mg, 5 mg, Oral, Q6H PRN  oxyCODONE (ROXICODONE) immediate release tablet 10 mg, 10 mg, Oral, Q4H PRN  HYDROmorphone (DILAUDID) injection 1 mg, 1 mg, Intravenous, Q4H PRN  lidocaine 1 % injection 5 mL, 5 mL, Intradermal, Once  Prior to Admission medications    Medication Sig Start Date End Date Taking? Authorizing Provider   naproxen (NAPROSYN) 500 MG tablet Take 1 tablet by mouth 2 times daily as needed for Pain 06/10/17  Yes Esaw GrandchildBryan Patrick O'Connell, MD   buPROPion (WELLBUTRIN XL) 150 MG extended release tablet Take 1 tablet by mouth every morning 06/11/17   Esaw GrandchildBryan Patrick O'Connell, MD        Allergies:  Patient has no known allergies.    Social History:   Social History  Social History   . Marital status: Divorced     Spouse name: N/A   . Number of children: N/A   . Years of education: N/A     Occupational History   . Not on file.     Social History Main Topics   . Smoking status: Current Every Day Smoker     Packs/day: 1.00     Years: 30.00   . Smokeless tobacco: Never Used      Comment: nicotene patch requested   . Alcohol use No      Comment: 6 pack per yr now; former 6-12 pk per day for 10 yrs   . Drug use: Yes     Frequency: 10.0 times per week     Types: Opiates , IV, Methamphetamines      Comment: heroin IVDU daily   . Sexual activity: Yes     Partners: Female     Other Topics Concern   . Not on file     Social History Narrative    Living with mother now - improved meal intake and assistance with DM care as mother  is a former Char[employer] fundraiser       Family History:    History reviewed. No pertinent family history.    REVIEW OF SYSTEMS:  CONSTITUTIONAL:  Negative for fatigue, and unexpected weight change, Positive for fevers  RESPIRATORY:  Negative for cough, SOB, and wheezing  CARDIOVASCULAR:  Negative for chest pains and palpatations  GASTROINTESTINAL: Negative for N/V, D/C or abdominal pain     GENITOURINARY: Negative for dysuria, frequency   HEMATOLOGIC/LYMPHATIC:  Negative for adenopathy. Does not bruise/bleed easily.  NEUROLOGICAL:  Negative for seizures and syncope  MSK: Negative for myalgias, or arthralgias  SKIN: Several abscesses as detailed below, erythema surrounding lesions, Negative for rash  PSYCH: Negative for changes in mood or behavior         PHYSICAL EXAM:  VITALS:  BP 112/70   Pulse 70   Temp 97.5 F (36.4 C) (Oral)   Resp 16   Ht 5\' 11"  (1.803 m)   Wt 175 lb (79.4 kg)   SpO2 98%   BMI 24.41 kg/m   24HR INTAKE/OUTPUT:    No intake/output data recorded.  No intake/output data recorded.    CONSTITUTIONAL:  Oriented to person, place, and time. Appears well nourished. No distress   HENT:  1.5 cm scab that expresses purulent discharge when palpated on the apex on scalp  NECK:  supple, symmetrical, trachea midline  LUNGS: Resp effort easy and unlabored, breath sounds normal  CARDIOVASCULAR:  NO JVD, RRR, No murmur  ABDOMEN: soft NT/ND  MUSCULOSKELETAL: Normal range of motion  NEUROLOGIC: a&o x3, no focal weaknesses, no deficits in CN 2-12  BUE: one large 5-6 cm abscess with fluctuance and surrounding erythema on the ventral left forearm, as well as one small 1 cm fluctuant abscess on the dorsal left forearm, one partially open abscess with fluctuance and erythema, covered by scab on the right elbow, and a similar lesion on the right wrist both 4-5 cm  BLE: 2 small 1 cm lesions with minimal fluctuance and overlying scab on the right shin, and a similar appearing lesion on the left        DATA:    CBC:   Recent Labs       10/05/17   0828   WBC  8.5   HGB  12.2*   HCT  36.8*   PLT  253  BMP:    Recent Labs      10/05/17   0828   NA  137   K  4.2   CL  103   CO2  25   BUN  19   CREATININE  0.55   GLUCOSE  188*     Hepatic:   Recent Labs      10/05/17   0828   AST  26   ALT  29   BILITOT  0.5   ALKPHOS  118     Mag:    No results for input(s): MG in the last 72 hours.   Phos:   No results for input(s): PHOS in the last 72 hours.   INR:   Recent Labs      10/05/17   0831   INR  0.98         IMPRESSION/RECOMMENDATIONS:    -to OR in am for I&D of multiple areas as would be too many to do at bedside  -consented and pre-op'd  -NPO at midnight  -cont vanc for empiric coverage for MRSA  -await blood cultures  -cont pain control as needed      Tillie Fantasia MD

## 2017-10-05 NOTE — ED Provider Notes (Signed)
[facility] 7E ONCOLOGY  eMERGENCY dEPARTMENT eNCOUnter      Pt Name: Mason Garza  MRN: [id number]  Birthdate 12-18-1965  Date of evaluation: 10/05/2017  Provider: Aurelio Brash, MD     CHIEF COMPLAINT       Chief Complaint   Patient presents with   . Abscess         HISTORY OF PRESENT ILLNESS   (Location/Symptom, Timing/Onset, Context/Setting, Quality, Duration, Modifying Factors, Severity) Note limiting factors.   HPI    Mason Garza is a 52 y.o. male who presents to the emergency department Complaining of abscesses to his left and right upper extremities.  The left upper extremity just at his proximal forearm on the anterior surface and the right one is to his wrist on the lateral aspect.  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  Has a prior history of MRSA and complaints at all so all diffuse abscesses on his body.      Nursing Notes were reviewed.    REVIEW OF SYSTEMS    (2+ for level 4; 10+ for level 5)   Review of Systems patient denies any headache nausea vomiting or diarrhea.  He complains of the abscesses as stated above.   He denies any chest pain or shortness of breath.  Denies any cough.  Denies any abdominal pain.  Denies any back pain.  He does complain of various skin lesions stated.  He denies any global weakness or focal weakness.  Denies any anxiety.  Balance of review of systems is negative.  PAST MEDICAL HISTORY     Past Medical History:   Diagnosis Date   . Chronic hepatitis C (HCC) 11/22/2015    active IVDU   . H/O echocardiogram 05/12/11    EF62%, mild LAE   . Neuropathy    . Not immune to hepatitis B virus     need to re-vaccinate   . Psychiatric problem    . Type II or unspecified type diabetes mellitus without mention of complication, not stated as uncontrolled        SURGICAL HISTORY     History reviewed. No pertinent surgical history.    CURRENT MEDICATIONS        Current Discharge Medication List      CONTINUE these medications which have NOT CHANGED    Details   naproxen (NAPROSYN) 500 MG tablet Take 1 tablet by mouth 2 times daily as needed for Pain  Qty: 60 tablet, Refills: 3      buPROPion (WELLBUTRIN XL) 150 MG extended release tablet Take 1 tablet by mouth every morning  Qty: 30 tablet, Refills: 0             ALLERGIES     Patient has no known allergies.    FAMILY HISTORY     History reviewed. No pertinent family history.     SOCIAL HISTORY       Social History     Social History   . Marital status: Divorced     Spouse name: N/A   . Number of children: N/A   . Years of education: N/A     Social History Main Topics   . Smoking status: Current Every Day Smoker     Packs/day: 1.00     Years: 30.00   . Smokeless tobacco: Never Used  Comment: nicotene patch requested   . Alcohol use No      Comment: 6 pack per yr now; former 6-12 pk per day for 10 yrs   . Drug use: Yes     Frequency: 10.0 times per week     Types: Opiates , IV, Methamphetamines      Comment: heroin IVDU daily   . Sexual activity: Yes     Partners: Female     Other Topics Concern   . None     Social History Narrative    Living with mother now - improved meal intake and assistance with DM care as mother is a former Scientist, clinical (histocompatibility and immunogenetics) Coma Scale  Eye Opening: Spontaneous  Best Verbal Response: Oriented  Best Motor Response: Obeys commands  Glasgow Coma Scale Score: 15      PHYSICAL EXAM    (up to 7 for level 4, 8 or more for level 5)     ED Triage Vitals   BP Temp Temp src Pulse Resp SpO2 Height Weight   -- -- -- -- -- -- -- --       Physical ExamWell-developed well-nourished male in moderate discomfort alert and oriented 3.  Normocephalic except for an abscess to his scalp.  Pupils equal round reactive to light.  Neck is supple with full range of motion.  Heart regular rate and rhythm lungs clear and equal bilaterally.  Abdomen soft nontender normal bowel sounds.  Extremities left arm  shows a very substantial abscess to the anterior lateral portion thereof.  He is right wrist is significant abscesses well on the lateral aspect of it.  Also has diffuse abscesses to the anterior portion Of his thighs and legs.  Neurologic exam is nonfocal psychiatrically he is mildly anxious.  DIAGNOSTIC RESULTS     EKG (Per Emergency Physician):       RADIOLOGY (Per Emergency Physician):     Interpretation per the Radiologist below, if available at the time of this note:  No results found.    ED BEDSIDE ULTRASOUND:   Performed by ED Physician - none    LABS:  Labs Reviewed   CBC WITH AUTO DIFFERENTIAL - Abnormal; Notable for the following:        Result Value    Hemoglobin 12.2 (*)     Hematocrit 36.8 (*)     MPV 6.7 (*)     Eosinophils 0.0 (*)     All other components within normal limits    Narrative:     Test Performed by [facility], [address] [place], [place]  [address]   COMPREHENSIVE METABOLIC PANEL - Abnormal; Notable for the following:     Glucose 188 (*)     All other components within normal limits    Narrative:     Test Performed by [facility], [address] [place], [place]  [address]   BASIC METABOLIC PANEL W/ REFLEX TO MG FOR LOW K - Abnormal; Notable for the following:     Glucose 276 (*)     BUN 21 (*)     All other components within normal limits    Narrative:     Test Performed by [facility], [address] [place], [place]  [address]   CBC - Abnormal; Notable for the following:     WBC 10.9 (*)     RBC 4.29 (*)     Hemoglobin 11.9 (*)  Hematocrit 35.3 (*)     RDW 14.6 (*)     MPV 7.2 (*)     All other components within normal limits    Narrative:     Test Performed by [facility], [address] [place], [place]  [id number]   HEMOGLOBIN A1C - Abnormal; Notable for the following:     Hemoglobin A1C 7.2 (*)     All other components within normal limits    Narrative:     Test Performed by [facility], [address] [place], [place]  [id number]   POCT GLUCOSE - Abnormal;  Notable for the following:     POC Glucose 436 (*)     All other components within normal limits    Narrative:     Test Performed by [facility], [address] [place], [place]  [id number]   POCT GLUCOSE - Abnormal; Notable for the following:     POC Glucose 340 (*)     All other components within normal limits    Narrative:     Test Performed by [facility], [address] [place], [place]  [id number]   CULTURE BLOOD #2   CULTURE BLOOD #1   LACTIC ACID, PLASMA    Narrative:     Test Performed by [facility], [address], [place], [place]  [id number]   PROTIME-INR    Narrative:     Test Performed by [facility], [address], [place], [place]  [id number]   URINE DRUG SCREEN   POCT GLUCOSE   POCT GLUCOSE   POCT GLUCOSE   POCT GLUCOSE   POCT GLUCOSE   POCT GLUCOSE   TYPE AND SCREEN    Narrative:     Test Performed by [facility], [address] [place], [place]  [id number]        All other labs were within normal range or not returned as of this dictation.    EMERGENCY DEPARTMENT COURSE and DIFFERENTIAL DIAGNOSIS/MDM:   Vitals:    Vitals:    10/05/17 1043 10/05/17 1125 10/05/17 1249 10/05/17 1942   BP: 107/65 118/72 112/70 125/75   Pulse: 71 69 70 78   Resp: 22 20 16 12    Temp:  97.5 F (36.4 C)  97.9 F (36.6 C)   TempSrc:  Oral  Oral   SpO2: 99% 100% 98% 98%   Weight:       Height:           Medications   sodium chloride flush 0.9 % injection 10 mL (10 mLs Intravenous Given 10/05/17 2057)   sodium chloride flush 0.9 % injection 10 mL (not administered)   enoxaparin (LOVENOX) injection 40 mg (40 mg Subcutaneous Given 10/05/17 1604)   ondansetron (ZOFRAN) injection 4 mg (4 mg Intravenous Given 10/06/17 0527)   nicotine (NICODERM CQ) 14 MG/24HR 1 patch (1 patch Transdermal Patch Applied 10/05/17 1717)   ketorolac (TORADOL) injection 15 mg (15 mg Intravenous Given 10/06/17 0418)   acetaminophen (TYLENOL) tablet 1,000 mg (1,000 mg Oral Not Given 10/06/17 0704)   insulin lispro (HUMALOG) injection vial 0-6  Units (6 Units Subcutaneous Given 10/05/17 1835)   insulin lispro (HUMALOG) injection vial 0-3 Units (2 Units Subcutaneous Given 10/05/17 2113)   docusate sodium (COLACE) capsule 100 mg (100 mg Oral Not Given 10/05/17 1713)   polyethylene glycol (GLYCOLAX) packet 17 g (not administered)   glucose (GLUTOSE) 40 % oral gel 15 g (not administered)  dextrose 50 % solution 12.5 g (not administered)   glucagon (rDNA) injection 1 mg (not administered)   dextrose 5 % solution (not administered)   vancomycin (VANCOCIN) 1,250 mg in dextrose 5 % 250 mL IVPB (0 mg Intravenous Stopped 10/06/17 0313)   oxyCODONE (ROXICODONE) immediate release tablet 5 mg (not administered)   oxyCODONE (ROXICODONE) immediate release tablet 10 mg (10 mg Oral Given 10/06/17 0129)   HYDROmorphone (DILAUDID) injection 1 mg (1 mg Intravenous Given 10/06/17 0522)   lidocaine 1 % injection 5 mL (not administered)   cefepime (MAXIPIME) 2 g IVPB extended (mini-bag) (0 g Intravenous Stopped 10/06/17 0056)   traZODone (DESYREL) tablet 100 mg (100 mg Oral Given 10/06/17 0132)   vancomycin (VANCOCIN) 1,000 mg in dextrose 5 % 250 mL IVPB (0 mg Intravenous Stopped 10/05/17 1125)   morphine injection 4 mg (4 mg Intravenous Given 10/05/17 0926)   HYDROmorphone (DILAUDID) injection 1 mg (1 mg Intravenous Given 10/05/17 1043)   diphenhydrAMINE (BENADRYL) injection 25 mg (25 mg Intravenous Given 10/05/17 1042)   famotidine (PEPCID) injection 40 mg (40 mg Intravenous Given 10/05/17 1040)   methylPREDNISolone sodium (SOLU-MEDROL) injection 125 mg (125 mg Intravenous Given 10/05/17 1040)       MDM.  I will get to full lab workup including blood cultures and to start this patient on vancomycin and admitted to the hospital for thorough and complete care.    REVAL:         CRITICAL CARE TIME   Total Critical Care time was 0 minutes, excluding separately reportable procedures.  There was a high probability of clinically significant/life threatening deterioration in the patient's  condition which required my urgent intervention.     CONSULTS:  IP CONSULT TO GENERAL SURGERY  PHARMACY TO DOSE VANCOMYCIN  IP CONSULT TO ADDICTION MEDICINE    PROCEDURES:  Unless otherwise noted below, none     Procedures stromal attempts by nurses unsuccessfully to place an IV an external jugular vein was identified on the right side of the patient's neck.  Patient's skin was prepped with ChloraPrep and a 20-gauge IV was placed on first attempt with excellent blood return.    FINAL IMPRESSION      1. Abscess    2. MRSA (methicillin resistant Staphylococcus aureus) infection    3. Intravenous drug abuse [facility])          DISPOSITION/PLAN   DISPOSITION        PATIENT REFERRED TO:  Ellene Route, MD  [address]  [place] [address]  [phone]            DISCHARGE MEDICATIONS:  Current Discharge Medication List             (Please note:  Portions of this note were completed with a voice recognition program.  Efforts were made to edit the dictations but occasionally words and phrases are mis-transcribed.)  Form v2016.J.[id number]    Aurelio Brash, MD (electronically signed)  Emergency Medicine Provider       Aurelio Brash, MD  10/06/17 [phone]

## 2017-10-05 NOTE — ED Notes (Signed)
[facility] Charity fundraisermedic with [facility]. [facility] IV site to right EJ intact, site without redness or edema.     Sunny SchleinSusan R. Viviana Trimble, RN  10/05/17 1251

## 2017-10-05 NOTE — ED Notes (Signed)
Pt states the itching is "gone" no c/o difficulty breathing or swallowing. Will continue to monitor pt. Call light in reach.     Sunny SchleinSusan R. Bricelyn Freestone, RN  10/05/17 1058

## 2017-10-05 NOTE — ED Notes (Signed)
Pt aware to be admitted. Waiting on bed assignment.     Sunny SchleinSusan R. Vandana Haman, RN  10/05/17 1047

## 2017-10-05 NOTE — ED Notes (Signed)
Pt amb to restroom     Sunny SchleinSusan R. Coriann Brouhard, RN  10/05/17 [phone]

## 2017-10-06 ENCOUNTER — Encounter: Payer: MEDICAID | Attending: Surgery | Primary: Internal Medicine

## 2017-10-06 LAB — BASIC METABOLIC PANEL W/ REFLEX TO MG FOR LOW K
Anion Gap: 9 NA
BUN: 21 mg/dL — ABNORMAL HIGH (ref 7–20)
CO2: 24 mmol/L (ref 22–30)
Calcium: 8.9 mg/dL (ref 8.4–10.4)
Chloride: 103 mmol/L (ref 98–107)
Creatinine: 0.54 mg/dL (ref 0.52–1.25)
EGFR IF NonAfrican American: >60 mL/min (ref >60)
Glucose: 276 mg/dL — ABNORMAL HIGH (ref 70–100)
Potassium: 4.8 mmol/L (ref 3.5–5.1)
Sodium: 136 mmol/L (ref 135–145)
eGFR African American: >60 mL/min (ref >60)

## 2017-10-06 LAB — TYPE AND SCREEN
Antibody Screen: NEGATIVE NA
Rh Type: NEGATIVE NA

## 2017-10-06 LAB — POCT GLUCOSE
POC Glucose: 340 mg/dL — ABNORMAL HIGH (ref 70–100)
POC Glucose: 263 mg/dL — ABNORMAL HIGH (ref 70–100)
POC Glucose: 236 mg/dL — ABNORMAL HIGH (ref 70–100)
POC Glucose: 266 mg/dL — ABNORMAL HIGH (ref 70–100)
POC Glucose: 326 mg/dL — ABNORMAL HIGH (ref 70–100)
POC Glucose: 429 mg/dL — ABNORMAL HIGH (ref 70–100)

## 2017-10-06 LAB — CBC
Hematocrit: 35.3 % — ABNORMAL LOW (ref 40.0–52.0)
Hemoglobin: 11.9 g/dL — ABNORMAL LOW (ref 13.0–18.0)
MCH: 27.8 pg (ref 26.0–34.0)
MCHC: 33.7 % (ref 32.0–36.0)
MCV: 82.4 fL (ref 80.0–98.0)
MPV: 7.2 fL — ABNORMAL LOW (ref 7.4–10.4)
Platelets: 273 10*3/uL (ref 140–440)
RBC: 4.29 10*6/uL — ABNORMAL LOW (ref 4.40–5.90)
RDW: 14.6 % — ABNORMAL HIGH (ref 11.5–14.5)
WBC: 10.9 10*3/uL — ABNORMAL HIGH (ref 3.6–10.7)

## 2017-10-06 LAB — HEMOGLOBIN A1C
Hemoglobin A1C: 7.2 % — ABNORMAL HIGH (ref 4.0–5.7)
eAG: 160 mg/dL

## 2017-10-06 MED ORDER — MEPERIDINE HCL 25 MG/ML IJ SOLN
25 MG/ML | INTRAMUSCULAR | Status: DC | PRN
Start: 2017-10-06 — End: 2017-10-06

## 2017-10-06 MED ORDER — HYDROMORPHONE HCL 1 MG/ML IJ SOLN
1 MG/ML | INTRAMUSCULAR | Status: DC | PRN
Start: 2017-10-06 — End: 2017-10-06

## 2017-10-06 MED ORDER — TRAZODONE HCL 50 MG PO TABS
50 MG | Freq: Every evening | ORAL | Status: DC
Start: 2017-10-06 — End: 2017-10-08
  Administered 2017-10-06 – 2017-10-08 (×2): 100 mg via ORAL

## 2017-10-06 MED ORDER — BACLOFEN 10 MG PO TABS
10 MG | Freq: Three times a day (TID) | ORAL | Status: DC
Start: 2017-10-06 — End: 2017-10-08
  Administered 2017-10-06 – 2017-10-08 (×7): 10 mg via ORAL

## 2017-10-06 MED ORDER — INSULIN GLARGINE 100 UNIT/ML SC SOLN
100 UNIT/ML | Freq: Every evening | SUBCUTANEOUS | Status: DC
Start: 2017-10-06 — End: 2017-10-08
  Administered 2017-10-07 – 2017-10-08 (×2): 10 [IU] via SUBCUTANEOUS

## 2017-10-06 MED ORDER — DIPHENHYDRAMINE HCL 50 MG/ML IJ SOLN
50 MG/ML | Freq: Once | INTRAMUSCULAR | Status: DC | PRN
Start: 2017-10-06 — End: 2017-10-06

## 2017-10-06 MED ORDER — TIZANIDINE HCL 4 MG PO TABS
4 MG | Freq: Four times a day (QID) | ORAL | Status: DC | PRN
Start: 2017-10-06 — End: 2017-10-08

## 2017-10-06 MED ORDER — HYDRALAZINE HCL 20 MG/ML IJ SOLN
20 MG/ML | INTRAMUSCULAR | Status: DC | PRN
Start: 2017-10-06 — End: 2017-10-06

## 2017-10-06 MED ORDER — OXYCODONE HCL 5 MG PO TABS
5 MG | ORAL | Status: DC | PRN
Start: 2017-10-06 — End: 2017-10-06

## 2017-10-06 MED ORDER — ONDANSETRON HCL 4 MG/2ML IJ SOLN
4 MG/2ML | Freq: Once | INTRAMUSCULAR | Status: DC | PRN
Start: 2017-10-06 — End: 2017-10-06

## 2017-10-06 MED ORDER — HYDROMORPHONE HCL 1 MG/ML IJ SOLN
1 MG/ML | INTRAMUSCULAR | Status: DC | PRN
Start: 2017-10-06 — End: 2017-10-06
  Administered 2017-10-06: 14:00:00 0.5 mg via INTRAVENOUS

## 2017-10-06 MED ORDER — LABETALOL HCL 5 MG/ML IV SOLN
5 MG/ML | INTRAVENOUS | Status: DC | PRN
Start: 2017-10-06 — End: 2017-10-06

## 2017-10-06 MED ORDER — XEROFORM PETROLAT GAUZE 5"X9" EX MISC
CUTANEOUS | Status: AC
Start: 2017-10-06 — End: 2017-10-06

## 2017-10-06 MED ORDER — PROMETHAZINE HCL 25 MG/ML IJ SOLN
25 MG/ML | Freq: Once | INTRAMUSCULAR | Status: DC | PRN
Start: 2017-10-06 — End: 2017-10-06

## 2017-10-06 MED FILL — OXYCODONE HCL 5 MG PO TABS: 5 mg | ORAL | Qty: 2

## 2017-10-06 MED FILL — HYDROMORPHONE HCL 1 MG/ML IJ SOLN: 1 mg/mL | INTRAMUSCULAR | Qty: 1

## 2017-10-06 MED FILL — MAPAP 500 MG PO TABS: 500 mg | ORAL | Qty: 2

## 2017-10-06 MED FILL — MAXIPIME 2 G IJ SOLR: 2 g | INTRAMUSCULAR | Qty: 2

## 2017-10-06 MED FILL — BACLOFEN 10 MG PO TABS: 10 mg | ORAL | Qty: 1

## 2017-10-06 MED FILL — VANCOMYCIN HCL 10 G IV SOLR: 10 g | INTRAVENOUS | Qty: 1250

## 2017-10-06 MED FILL — NICOTINE 14 MG/24HR TD PT24: 14 mg/(24.h) | TRANSDERMAL | Qty: 1

## 2017-10-06 MED FILL — TRAZODONE HCL 50 MG PO TABS: 50 mg | ORAL | Qty: 2

## 2017-10-06 MED FILL — "XEROFORM PETROLAT GAUZE 5""X9"" EX MISC": CUTANEOUS | Qty: 1

## 2017-10-06 MED FILL — KETOROLAC TROMETHAMINE 15 MG/ML IJ SOLN: 15 mg/mL | INTRAMUSCULAR | Qty: 1

## 2017-10-06 MED FILL — ONDANSETRON HCL 4 MG/2ML IJ SOLN: 4 MG/2ML | INTRAMUSCULAR | Qty: 2

## 2017-10-06 NOTE — Progress Notes (Signed)
Department of Surgery - Progress Note -  Surg 1    PATIENT NAME: Mason Garza    DOB:  09/05/1965    MRN:  [id number]  ATTENDING PHYSICIAN:  Esaw GrandchildBryan Patrick O'Connell,*  ADMIT DATE:  10/05/2017    TODAY'S DATE:  10/06/2017    SUBJECTIVE  Patient ready for surgery today. He denies any further questions. He states that his forearm abscess "popped" overnight and started expressing pus. He has some issues with pain overnight, that improved with pain medication.     OBJECTIVE    VITALS:  BP 130/76   Pulse 65   Temp 97 F (36.1 C) (Temporal)   Resp 18   Ht 5\' 11"  (1.803 m)   Wt 175 lb (79.4 kg)   SpO2 98%   BMI 24.41 kg/m     INTAKE/OUTPUT:    I/O last 3 completed shifts:  In: 240 [P.O.:240]  Out: -   I/O this shift:  In: 350 [I.V.:350]  Out: -     CONSTITUTIONAL: NAD, A&O X3.   HENT:  1.5 cm scab that expresses purulent discharge when palpated on the apex on scalp  LUNGS: Resp effort easy and unlabored  ABDOMEN: soft ND/NT  BUE: one large 5-6 cm abscess with fluctuance and surrounding erythema on the ventral left forearm, as well as one small 1 cm fluctuant abscess on the dorsal right forearm, one partially open abscess with fluctuance and erythema, covered by scab on the right elbow, and a similar lesion on the right wrist both 4-5 cm  BLE: 4 small 1 cm lesions with minimal fluctuance and overlying scab on the right shin, and a similar appearing lesion on the left    Data  Recent Labs      10/05/17   0828  10/06/17   0423   WBC  8.5  10.9*   HGB  12.2*  11.9*   HCT  36.8*  35.3*   PLT  253  273     Recent Labs      10/05/17   0828  10/06/17   0423   NA  137  136   K  4.2  4.8   CL  103  103   CO2  25  24   BUN  19  21*   CREATININE  0.55  0.54   GLUCOSE  188*  276*     Recent Labs      10/05/17   0828   AST  26   ALT  29   BILITOT  0.5   ALKPHOS  118         Current Inpatient Medications    Current Facility-Administered Medications: HYDROmorphone (DILAUDID) injection 0.25 mg, 0.25 mg, Intravenous, Q5 Min  PRN  HYDROmorphone (DILAUDID) injection 0.5 mg, 0.5 mg, Intravenous, Q5 Min PRN  HYDROmorphone (DILAUDID) injection 0.25 mg, 0.25 mg, Intravenous, Q5 Min PRN  HYDROmorphone (DILAUDID) injection 0.5 mg, 0.5 mg, Intravenous, Q5 Min PRN  oxyCODONE (ROXICODONE) immediate release tablet 5 mg, 5 mg, Oral, PRN **OR** oxyCODONE (ROXICODONE) immediate release tablet 10 mg, 10 mg, Oral, PRN  diphenhydrAMINE (BENADRYL) injection 12.5 mg, 12.5 mg, Intravenous, Once PRN  ondansetron (ZOFRAN) injection 4 mg, 4 mg, Intravenous, Once PRN  promethazine (PHENERGAN) injection 6.25 mg, 6.25 mg, Intravenous, Once PRN  labetalol (NORMODYNE;TRANDATE) injection 5 mg, 5 mg, Intravenous, Q10 Min PRN  hydrALAZINE (APRESOLINE) injection 5 mg, 5 mg, Intravenous, Q10 Min PRN  meperidine (DEMEROL) injection 12.5 mg, 12.5 mg, Intravenous, Q5 Min  PRN  [MAR Hold] traZODone (DESYREL) tablet 100 mg, 100 mg, Oral, Nightly  [MAR Hold] sodium chloride flush 0.9 % injection 10 mL, 10 mL, Intravenous, 2 times per day  [facility] Hold] sodium chloride flush 0.9 % injection 10 mL, 10 mL, Intravenous, PRN  [MAR Hold] enoxaparin (LOVENOX) injection 40 mg, 40 mg, Subcutaneous, Daily  [MAR Hold] ondansetron (ZOFRAN) injection 4 mg, 4 mg, Intravenous, Q6H PRN  [MAR Hold] nicotine (NICODERM CQ) 14 MG/24HR 1 patch, 1 patch, Transdermal, Daily  [MAR Hold] ketorolac (TORADOL) injection 15 mg, 15 mg, Intravenous, Q6H  [MAR Hold] acetaminophen (TYLENOL) tablet 1,000 mg, 1,000 mg, Oral, 3 times per day  [MAR Hold] insulin lispro (HUMALOG) injection vial 0-6 Units, 0-6 Units, Subcutaneous, TID WC  [MAR Hold] insulin lispro (HUMALOG) injection vial 0-3 Units, 0-3 Units, Subcutaneous, Nightly  [MAR Hold] docusate sodium (COLACE) capsule 100 mg, 100 mg, Oral, Daily  [MAR Hold] polyethylene glycol (GLYCOLAX) packet 17 g, 17 g, Oral, Daily PRN  [MAR Hold] glucose (GLUTOSE) 40 % oral gel 15 g, 15 g, Oral, PRN  [MAR Hold] dextrose 50 % solution 12.5 g, 12.5 g, Intravenous, PRN  [MAR  Hold] glucagon (rDNA) injection 1 mg, 1 mg, Intramuscular, PRN  [MAR Hold] dextrose 5 % solution, 100 mL/hr, Intravenous, PRN  [MAR Hold] vancomycin (VANCOCIN) 1,250 mg in dextrose 5 % 250 mL IVPB, 1,250 mg, Intravenous, Q12H  [MAR Hold] oxyCODONE (ROXICODONE) immediate release tablet 5 mg, 5 mg, Oral, Q6H PRN  [MAR Hold] oxyCODONE (ROXICODONE) immediate release tablet 10 mg, 10 mg, Oral, Q4H PRN  [MAR Hold] HYDROmorphone (DILAUDID) injection 1 mg, 1 mg, Intravenous, Q4H PRN  [MAR Hold] lidocaine 1 % injection 5 mL, 5 mL, Intradermal, Once  [MAR Hold] cefepime (MAXIPIME) 2 g IVPB extended (mini-bag), 2 g, Intravenous, Q12H    ASSESSMENT AND PLAN    51 y.o. male with multiple abscesses of the legs, arms and scalp    -to OR today for multiple I&D  -NPO, may resume carb control diet following procedure   -cont vanc for empiric coverage for MRSA  -await blood cultures  -will obtain wound cultures in OR  -cont pain control as needed    Electronically signed by Tillie Fantasia, MD on 10/06/17 at 8:50 AM  PGY-4  General surgery

## 2017-10-06 NOTE — Consults (Signed)
Chemical Dependency (Addiction Medicine)  Consultation H&P           Admit Date: 10/05/2017   Primary Care Physician: Omelia Blackwater, MD  MRN: [[id number]]     Chief Complaint   Patient presents with   . Abscess       History of Present Illness   Mason Garza is a 52 y.o. year old homeless, white male with a PMH of poorly controlled DM, neuropathy, hepatitis C and a 10-15 year history of polysubstance use. His drug of choice is IV heroin. He is also using IV meth and marijuana frequently. He has very limited social support, resources, and his numerous medical complications are being worsened by poor social determinants of health. He is being treated for cellulitis and is now POD #0 from numerous abscess I&D's for which he was taken to the OR earlier today. He is receiving full-opioid agonists for pain control now. This patient is known to chemical dependency services from two visits in Oct 2018 for a similar situation: abscesses in the context of IV drug use. He left AMA one of those times. The patient is not in acute drug withdrawal now.    Mason Garza states that he is using IV heroin now to deal with neuropathy. "I'll be honest, if I don't get something to help with my muscle spasms then I'll probably end up using again even though I don't want to." He says he uses drugs to help with neuropathy and muscle spasms. He says "doctors" won't prescribe him the insulin because he's an IV drug user. He also states he has a hard time reading and writing, and is homeless now.     He is using $10 worth of heroin every 2-3 days. He will also smoke marijuana when he can get it, and uses IV meth about once a week if one of his friends has it. He denies daily IV opioid use at this point. The last time he used heroin was the day before he came to our ER (so probably on 2/16).    On admission, a urine drug screen was not collected. He has received numerous opioids for pain control since arrival.    Substance Use  History  Consequences   IVDA[x]     Blackouts related to substance use[]     History of withdrawal seizures[]     History of delirium tremens[]     History of overdoses[x]    Legal consequences of substance use[x]      Current Substance Use   ETOH:  denies   BENZOS: denies   COCAINE: denies   AMPHETAMINES: IV meth use about once a week   OPIATES: IV heroin/fentanyl once every 2-3 days, $10 worth   MARIJUANA: occasional    TOBACCO: daily     Treatment History    Longest period of sobriety since daily use began is unclear.   INPATIENT REHABILATATION: never   CHEMICAL DEPENDENCY IOP: yes in the past, but doesn't remember the name   DETOXIFICATIONS: never   12 STEP MEETINGS: rarely, in the past, none recently   MEDICATION ASSISTED TREATMENT: never    Substance Use Disorder Criteria  2-3 = mild; 4-5 = moderate; 6 or >6 = severe substance use disorder    [x]  Taking substance in larger amounts and/or for longer than intended   [x]  Wanting to cut down or quit but not being able to   [x]  Spending a lot of time obtaining the substance   [x]  Craving or a strong  desire to use substance   [x]  Repeatedly doesn't carry out major obligations due to substance use   [x]  Using despite recurring social or interpersonal problems   [x]  Reducing social, occupational, or recreational activities   [x]  Recurrent use in physically hazardous situations   [x]  Consistent use despite recurrent physical or psychological difficulties   [x]  Tolerance (increased amounts to achieve intoxication or diminished effect)   [x]  Withdrawal syndrome or the substance is used to avoid withdrawal      Remaining History     Past Medical History:   Diagnosis Date   . Chronic hepatitis C ([place]) 11/22/2015    active IVDU   . H/O echocardiogram 05/12/11    EF62%, mild LAE   . Neuropathy    . Not immune to hepatitis B virus     need to re-vaccinate   . Psychiatric problem    . Type II or unspecified type diabetes mellitus without mention of  complication, not stated as uncontrolled      Past Surgical History:   Procedure Laterality Date   . OTHER SURGICAL HISTORY Bilateral 10/06/2017    Irrigation and Debridement of Abscesses arms and scalp     History reviewed. No pertinent family history.  Social History     Social History   . Marital status: Divorced     Spouse name: N/A   . Number of children: N/A   . Years of education: N/A     Occupational History   . Not on file.     Social History Main Topics   . Smoking status: Current Every Day Smoker     Packs/day: 1.00     Years: 30.00   . Smokeless tobacco: Never Used      Comment: nicotene patch requested   . Alcohol use No      Comment: 6 pack per yr now; former 6-12 pk per day for 10 yrs   . Drug use: Yes     Frequency: 10.0 times per week     Types: Opiates , IV, Methamphetamines      Comment: heroin IVDU daily   . Sexual activity: Yes     Partners: Female     Other Topics Concern   . Not on file     Social History Narrative    Living with mother now - improved meal intake and assistance with DM care as mother is a former Therapist, sports     Prescriptions Prior to Admission: naproxen (NAPROSYN) 500 MG tablet, Take 1 tablet by mouth 2 times daily as needed for Pain  buPROPion (WELLBUTRIN XL) 150 MG extended release tablet, Take 1 tablet by mouth every morning    Allergies: Patient has no known allergies.    Review of Systems     Constitutional: Negative for chills, fever, weakness, or weight loss.   Skin: Numerous abscesses, cellulitis  ENMT: Negative for congestion, hearing loss, sore throat and tinnitus.    Eyes: Negative for vision changes, vision loss or eye pain.   Respiratory: Negative for cough, shortness of breath and wheezing.    Cardiovascular: Negative for chest pain and leg swelling.   Gastrointestinal: Negative for abdominal pain, constipation/diarrhea, nausea/vomiting.   Genitourinary: Negative for dysuria or increased frequency.   Neurological: Negative for dizziness, sensory change, focal weakness and  headaches. +pins and needles sensation  Musculoskeletal: Negative for myalgias and arthralgias. +muscle spasms  Endocrine: Negative for polydipsia, polyphagia, or polyuria.  Hematologic: Negative for anemia, bleeding, or easy bruising.  Psychiatric:  Negative for depression, mania/hypomania, panic attacks, phobias, obsessions/compulsions, PTSD, hallucinations, delusions, SI/SA    Physical Exam     Vitals:    10/06/17 0845 10/06/17 0853 10/06/17 0915 10/06/17 0951   BP: 123/71 123/72 132/78 104/60   Pulse: 65 65 65 60   Resp: 20 18 20     Temp:  97.2 F (36.2 C)  97.6 F (36.4 C)   TempSrc:    Temporal   SpO2: 98% 98% 98% 97%   Weight:       Height:         General appearance: Cooperative, no distress, appears older stated age, non-toxic but ill-appearing, disheveled.   Skin: Dressing on all 4 extremities. +scattered areas of erythema. Negative for diaphoresis. Negative for palmar erythema.  ENMT: Conjunctivae/corneas clear. PERRL, EOM's intact. Nares normal.  Poor dentition. Negative for tongue fasciculations.  Neck: Normal range of motion.   Respiratory: Clear to auscultation bilaterally. Non-labored breathing.  Cardiovascular: RRR. S1, S2 normal. No murmur, click, rub or gallop. Pulses intact in 4 extremities.  Gastrointestinal: Soft, non-tender. Bowel sounds normal. No masses,  no organomegaly.  Neurologic: CN II-XII grossly intact. Negative for upper extremity tremors bilaterally.  Musculoskeletal: Normal muscle strength and tone in 4 distal extremities. Normal range of motion in 4 distal extremities.  Psychiatric:  Alert and oriented to person, place, and time. Insight: poor. Judgment: poor. Mood: irritable. Affect: congruent with mood.     Imaging/Labs/Meds     Recent Results (from the past 48 hour(s))   Hemogram  (CBC) w/Auto Diff    Collection Time: 10/05/17  8:28 AM   Result Value Ref Range    WBC 8.5 3.6 - 10.7 10*3/uL    RBC 4.48 4.40 - 5.90 10*6/uL    Hemoglobin 12.2 (L) 13.0 - 18.0 g/dL    Hematocrit  36.8 (L) 40.0 - 52.0 %    MCV 82.0 80.0 - 98.0 fL    MCH 27.2 26.0 - 34.0 pg    MCHC 33.2 32.0 - 36.0 %    RDW 14.3 11.5 - 14.5 %    Platelets 253 140 - 440 10*3/uL    MPV 6.7 (L) 7.4 - 10.4 fL    Granulocytes % 64.7 40.0 - 80.0 %    Lymphocyte % 25.2 20.0 - 40.0 %    Monocytes 8.9 2.0 - 10.0 %    Eosinophils 0.0 (L) 1.0 - 6.0 %    Basophils 1.2 0.0 - 2.0 %    Absolute Neut # 5.5 1.8 - 7.0 10*3/uL    Absolute Lymph # 2.1 1.0 - 4.3 10*3/uL    Absolute Mono # 0.8 0.0 - 0.8 10*3/uL    Absolute Eos # 0.0 0.0 - 0.5 10*3/uL    Absolute Baso # 0.1 0.0 - 0.2 10*3/uL   Comprehensive Metabolic Panel    Collection Time: 10/05/17  8:28 AM   Result Value Ref Range    Sodium 137 135 - 145 mmol/L    Potassium 4.2 3.5 - 5.1 mmol/L    Chloride 103 98 - 107 mmol/L    CO2 25 22 - 30 mmol/L    Anion Gap 8 NA    Glucose 188 (H) 70 - 100 mg/dL    BUN 19 7 - 20 mg/dL    CREATININE 0.55 0.52 - 1.25 mg/dL    eGFR African American >60.0 >60 mL/min    EGFR IF NonAfrican American >60.0 >60 mL/min    Calcium 9.0 8.4 - 10.4 mg/dL  Albumin,Serum 3.8 3.5 - 5.0 g/dL    Total Protein 7.5 6.3 - 8.2 g/dL    Total Bilirubin 0.5 0.2 - 1.3 mg/dL    Alkaline Phosphatase 118 38 - 126 U/L    ALT 29 13 - 69 U/L    AST 26 15 - 46 U/L   Lactic Acid, Plasma    Collection Time: 10/05/17  8:28 AM   Result Value Ref Range    Lactic Acid 1.0 0.7 - 2.0 mmol/L   Protime-INR    Collection Time: 10/05/17  8:31 AM   Result Value Ref Range    Protime 9.9 9.0 - 12.0 s    INR 0.98 0.90 - 1.10 NA   POCT Glucose    Collection Time: 10/05/17  6:30 PM   Result Value Ref Range    POC Glucose 436 (H) 70 - 100 mg/dL   POCT Glucose    Collection Time: 10/05/17  9:12 PM   Result Value Ref Range    POC Glucose 340 (H) 70 - 100 mg/dL   Basic Metabolic Panel w/ Reflex to MG    Collection Time: 10/06/17  4:23 AM   Result Value Ref Range    Sodium 136 135 - 145 mmol/L    Potassium 4.8 3.5 - 5.1 mmol/L    Chloride 103 98 - 107 mmol/L    CO2 24 22 - 30 mmol/L    Anion Gap 9 NA     Glucose 276 (H) 70 - 100 mg/dL    BUN 21 (H) 7 - 20 mg/dL    CREATININE 0.54 0.52 - 1.25 mg/dL    eGFR African American >60.0 >60 mL/min    EGFR IF NonAfrican American >60.0 >60 mL/min    Calcium 8.9 8.4 - 10.4 mg/dL   CBC    Collection Time: 10/06/17  4:23 AM   Result Value Ref Range    WBC 10.9 (H) 3.6 - 10.7 10*3/uL    RBC 4.29 (L) 4.40 - 5.90 10*6/uL    Hemoglobin 11.9 (L) 13.0 - 18.0 g/dL    Hematocrit 35.3 (L) 40.0 - 52.0 %    MCV 82.4 80.0 - 98.0 fL    MCH 27.8 26.0 - 34.0 pg    MCHC 33.7 32.0 - 36.0 %    RDW 14.6 (H) 11.5 - 14.5 %    Platelets 273 140 - 440 10*3/uL    MPV 7.2 (L) 7.4 - 10.4 fL   HEMOGLOBIN A1C    Collection Time: 10/06/17  4:23 AM   Result Value Ref Range    Hemoglobin A1C 7.2 (H) 4.0 - 5.7 %    eAG 160 mg/dL   TYPE AND SCREEN    Collection Time: 10/06/17  4:23 AM   Result Value Ref Range    ABO Grouping A NA    Rh Type NEG NA    Antibody Screen NEG NA   POCT Glucose    Collection Time: 10/06/17  7:08 AM   Result Value Ref Range    POC Glucose 263 (H) 70 - 100 mg/dL   Culture, Aerobic Bacteria with Gram Stai    Collection Time: 10/06/17  7:50 AM   Result Value Ref Range    Gram Stain Result       Few polymorphonuclear cells/lpf.  No organisms seen.     Culture, Aerobic Bacteria with Gram Stai    Collection Time: 10/06/17  7:50 AM   Result Value Ref Range    Gram Stain Result  Many polymorphonuclear cells/lpf.  Few gram positive cocci in clusters.     POCT Glucose    Collection Time: 10/06/17  8:28 AM   Result Value Ref Range    POC Glucose 236 (H) 70 - 100 mg/dL   POCT Glucose    Collection Time: 10/06/17 10:00 AM   Result Value Ref Range    POC Glucose 266 (H) 70 - 100 mg/dL   POCT Glucose    Collection Time: 10/06/17 12:31 PM   Result Value Ref Range    POC Glucose 326 (H) 70 - 100 mg/dL     . traZODone  100 mg Oral Nightly   . insulin glargine  10 Units Subcutaneous Nightly   . sodium chloride flush  10 mL Intravenous 2 times per day   . enoxaparin  40 mg Subcutaneous Daily   .  nicotine  1 patch Transdermal Daily   . acetaminophen  1,000 mg Oral 3 times per day   . insulin lispro  0-6 Units Subcutaneous TID WC   . insulin lispro  0-3 Units Subcutaneous Nightly   . docusate sodium  100 mg Oral Daily   . vancomycin  1,250 mg Intravenous Q12H   . lidocaine  5 mL Intradermal Once   . cefepime  2 g Intravenous Q12H     sodium chloride flush, ondansetron, polyethylene glycol, glucose, dextrose, glucagon (rDNA), dextrose, oxyCODONE, oxyCODONE, HYDROmorphone    Problem List     Active Problems:    Hepatitis C, Ab + no rna testing    IVDU (intravenous drug user)    Type 2 diabetes mellitus with diabetic neuropathy (HCC)    MRSA (methicillin resistant Staphylococcus aureus) infection    Polysubstance abuse ([place])    Cellulitis of left upper extremity    Abscess  Resolved Problems:    * No resolved hospital problems. *     Impression     52 year old male that has been using IV heroin/meth that presented with numerous abscesses on extremities. He now has acute pain in the setting of an opioid/meth use disorder, but is not in acute drug withdrawal. He has very poor social determinants of health which are contributing to his cycle of addiction.     Plan     Acute pain s/p I&D's in the setting of opioid use disorder/tolerance/hyperalgesia  - Detox not indicated now and he doesn't appear to be in opioid withdrawal.  - Current pain regimen is controlling pain now, but I discussed with him that he will need to be tapered off opioids soon.  - Will defer pain management to primary team but agree with using dilaudid for now given his tolerance levels.  - Encourage tapering dilaudid/oxycodone as soon as possible.  - Will add baclofen 10 mg TID for now for muscle spasms.  - I doubt he will need opioid detoxification during this admission even if we stop his opioids.    Severe opioid/meth use disorder  - At this point he seems to be using drugs to deal with neuropathy/pain related to diabetes.  - He is  non-compliant with diabetes mgmt and medical issues in general.  - He needs to be in inpatient residential drug treatment in my opinion, but he almost assuredly won't be accepted at any facility in his current state; him not having health insurance will delay matters as well even if he does agree to go to drug treatment; unfortunately our system isn't set up to deal with someone in his  condition.  - I tried to educate patient on the need to get diabetes under better control to help with neuropathy, and the fact that if he doesn't have neuropathy maybe his impetus to use drugs will stop.  - I believe he is also illiterate which multiples his social stressors.  - He said lyrica helped with neuropathic pain in the past, better than gabapentin; but without medical insurance he won't be able to use this at home either.    Remaining medical management per primary team and surgery.    Thank you for this consultation. He can meet with SW who can help direct him to drug treatment if he desires, however this will be difficult for the issues outlined above. Signing off. Please contact me with any questions or if he starts to show signs of opioid withdrawal once narcotics are stopped.    Fredricka Bonine, MD  Addiction Medicine  10/06/2017 at 4:13 PM    I spent over 51% of total time 50 minutes counseling or coordinating care and provided discussion regarding this patient's chemical dependency history, assessing withdrawal status and detox medications, and discussing treatment plan.

## 2017-10-06 NOTE — Progress Notes (Signed)
Dr Excell Seltzerooper aware of MBS of 236. No further orders at this time.

## 2017-10-06 NOTE — Other (Signed)
Dr. Tretha SciaraSubichin notified pt increasingly agitated throughout shift. Pt has multiple complaints. Pt upset that his abscesses were not dressed and left OTA. Pt expressing abscesses on his own, claims they opened up on their own and he simply pushed out the drainage. Two abscesses covered up at patient request with DSD. Pt also upset with the cleanliness of his restroom, pt states that surgical staff was unprofessional and is not comfortable with going to surgery in the morning. Pt wants to speak with Dr. Gershon Crane'Connell prior to any procedures. RN notified pt that Dr. Gershon Crane'Connell is not able to come and see him at this time but RN would contact the on call doctor. Pt believes that cutting open his abscesses is not necessary. Pt threatening to pull out IV access and leave AMA. RN advised pt this would not be in his best interest. New order for trazodone at pt request. Will continue to monitor. Electronically signed by Junie Spencerhelsea Sylvi Rybolt, RN on 10/06/2017 at 1:40 AM

## 2017-10-06 NOTE — Anesthesia Post-Procedure Evaluation (Signed)
Department of Anesthesiology                             Post Anesthesia Note    Name: Mason Garza MRN: [id number] DOB: 04/21/1966    (Age-52 y.o.)     This note was authored with review of the notes of the PACU and/or Same day stay and/or direct communication with the patient with an anesthesia care provider.     POST  ANESTHESIA  EVALUATION    No data found.       BP 104/60   Pulse 60   Temp 97.6 F (36.4 C) (Temporal)   Resp 20   Ht 5\' 11"  (1.803 m)   Wt 175 lb (79.4 kg)   SpO2 97%   BMI 24.41 kg/m      Pain Level: 10    Vital signs: Respiratory and Cardiovascular function within normal limits (See Nursing Record)  Level of consciousness: Patient awake/ Able to participate      Return to baseline mental status: Yes Airway status: Normal   Pain controlled: Yes Dental injury: No   Nausea/Vomiting controlled: Yes Complications: no   Well hydrated: Yes      Patient Experience comments: None expressed    Electronically signed by Skip Mayeronna Shantavia Jha, APRN - CRNA on 10/06/2017 at 1:06 PM

## 2017-10-06 NOTE — Brief Op Note (Signed)
Brief Postoperative Note    Mason Garza  Date of Birth:  08/13/1966  [id number]    Pre-operative Diagnosis: multiple abscesses    Post-operative Diagnosis: Same    Procedure: I&D of multiple abscesses    Anesthesia: General    Surgeons/Assistants: Delphina CahillBender, Amaryah Mallen    Estimated Blood Loss: less than 50     Complications: None    Specimens: Was Obtained: cultures bilateral forearm abscesses    Findings: large left forearm abscess, abscess of the right elbow, forearm and wrist, abscesses of the right shin x4 small, abscess of left shin, abscess of scalp     Electronically signed by Mason FantasiaMeridith Shalie Schremp, MD on 10/06/2017 at 8:28 AM

## 2017-10-06 NOTE — Care Coordination-Inpatient (Signed)
Went into room introduced self and role to pt.  Discharge preparation checklist given and reviewed.  Pt came to ER with c/o abscesses to L and R UE.  General surgery was consulted and took him to OR to I&D sights.  Started on IV antibiotics.  ADM has been consulted.  Pt lives with his brother and girlfriend.  States he is independent of ADL's.  Does not have insurance.  States he did not fill out paper work.  He thought someone in ER helped him to fill out, has pending Medicaid and [employer] involved.  Informed SW.  States he has trouble with transportation cannot get to appointments.  Unsure if he will need long term antibiotics.  Will continue to monitor.Electronically signed by Loran SentersSusan Healey-Fast, RN on 10/06/2017 at 1:26 PM

## 2017-10-06 NOTE — Progress Notes (Signed)
Notified Dr. Lonia SkinnerGinesi that patient has unwrapped all sites.  Per Dr. Lonia SkinnerGinesi redress with adaptic, Petroleum dressing, and kerlex.

## 2017-10-06 NOTE — Care Coordination-Inpatient (Signed)
Reviewed discharge planning sections and verified.Electronically signed by Loran SentersSusan Healey-Fast, RN on 10/06/2017 at 1:11 PM

## 2017-10-06 NOTE — Plan of Care (Signed)
Problem: Falls - Risk of:  Intervention: Assess risk factors for falls  Multiple medications; I & D of abscess  Intervention: Gait assessment  Ambulate independently with steady gait  Intervention: Assess fall prevention measures  Yellow armband, star, socks; bed locked and in lowest position; call light and bedside table within reach; hourly safety checks.    Goal: Will remain free from falls  Will remain free from falls   Outcome: Met This Shift    Goal: Absence of physical injury  Absence of physical injury   Outcome: Met This Shift      Problem: Pain:  Intervention: Opioid analgesia side-effects  No side effects noted  Intervention: Assess barriers to pain control  High opioid tolerance  Intervention: Promote participation in pain management plan  PO/IV pain medications dosages and schedule reviewed.  Non-pharmacological interventions (rest, elevation, ice) discussed.  Patient encouraged to notify RN of episodes of pain/efficacy of interventions and to report changes in condition.    Goal: Pain level will decrease  Pain level will decrease   Outcome: Ongoing    Goal: Control of acute pain  Control of acute pain   Outcome: Ongoing    Goal: Control of chronic pain  Control of chronic pain   Outcome: Ongoing

## 2017-10-06 NOTE — Op Note (Signed)
Mason Garza, Mason Garza  MEDICAL RECORD #:         [id number]  ADMISSION DATE:           10/05/2017  SURGERY DATE:             10/06/2017  ACCOUNT #:                [id number]  DATE OF BIRTH:            1966/01/25  AGE:                      51  ADMITTING PHYSICIAN:      Deborra MedinaBryan P O'Connell, MD  ATTENDING PHYSICIAN:      Benjaman LobeElizabeth A. Malissie Musgrave, M.D.  DICTATING PHYSICIAN:      Benjaman LobeElizabeth A. Preslynn Bier, M.D.                               OPERATIVE RECORD      Procedure:  INCISION AND DRAINAGE OF MULTIPLE ABSCESSES.    Preoperative Diagnoses:  Multiple superficial skin abscesses with  history of IV drug abuse:  2 cm subcutaneous abscess of mid parietal  occipital scalp, 2 cm abscess right forearm volar surface, 2 cm  abscess right forearm dorsal surface, 2 cm abscess left forearm volar  surface near antecubital fossa, three 1 cm abscesses right lower  extremity.    Postoperative Diagnoses:  Multiple superficial skin abscesses with  history of IV drug abuse:  2 cm subcutaneous abscess of mid parietal  occipital scalp, 2 cm abscess right forearm volar surface, 2 cm  abscess right forearm dorsal surface, 2 cm abscess left forearm volar  surface near antecubital fossa, three 1 cm abscesses right lower  extremity.    Anesthesia:  General.    Assistant:  Dr. Lonia SkinnerGinesi.    Specimen:  Culture of left forearm abscess sent separately from  culture of right forearm abscess.    Description of Procedure:  The patient had been marked prior to coming  to surgery.  History and physical had been performed and no drug  allergies were reviewed.  He was brought to operative suite #4 and  general anesthetic administered.  He was prepped and draped in a  sterile fashion and a time-out performed.  The largest abscess was the  left forearm abscess.  A cruciate incision was performed there.  At  all other abscesses, a single transverse incision was performed.  Pus  was obtained from all the sites.  All sites were irrigated with  saline  and loculations broken up with a hemostat and irrigated again and they  were covered with Surgicel, Adaptic, and Kerlix including the scalp.    [employer] Job ID: [id number]        Benjaman LobeElizabeth A. Rollin Kotowski, M.D.    DOD:10/06/2017 08:16 A  EB/dsk  DOT:10/06/2017 08:56 A  Job Number: [id number]  Document Number: [id number]  cc:   Benjaman LobeElizabeth A. Jemuel Laursen, M.D.        [address]        [address]        [place] [id number]          Deborra MedinaBryan P O'Connell, MD        [facility]        [address] [phone]  [place] [address]

## 2017-10-06 NOTE — Care Coordination-Inpatient (Addendum)
S/W following, recvd referral today. Patient in with abcesses, patient with IVDA, recent use of Heroin, Meth and Marijuana. I did leave a message for [employer] as to Medicaid. I will follow medical course to determine d/c needs.  Electronically signed by Laurette SchimkeAndrea Wilbon Obenchain, LSW on 10/06/17 at 1:42 PM  [employer] called back, they did apply for Medicaid for patient today, no pending number yet.  Electronically signed by Laurette SchimkeAndrea Emmett Bracknell, LSW on 10/06/17 at 1:53 PM

## 2017-10-06 NOTE — Progress Notes (Signed)
[name] Progress Note        Mason Garza  DOB:  09-May-1966(52 y.o.)  MRN:  [id number]    Date: 10/06/17   Subjective:    HPI  The patient complains of multiple abscesses. Patient states he feels worse today. Pain is 7/10, states he was very uncomfortable yesterday and states that he's going to leave if we don't control his pain. Dilaudid is the only medication that has helped him so far. Has neuropathic pain as well as pain from sites of abscesses. Overnight left forearm abscess spontaneously drained with pus expressed.  Denies any n/v, f/c, CP, SOB.    Scheduled Meds:  . traZODone  100 mg Oral Nightly   . sodium chloride flush  10 mL Intravenous 2 times per day   . enoxaparin  40 mg Subcutaneous Daily   . nicotine  1 patch Transdermal Daily   . acetaminophen  1,000 mg Oral 3 times per day   . insulin lispro  0-6 Units Subcutaneous TID WC   . insulin lispro  0-3 Units Subcutaneous Nightly   . docusate sodium  100 mg Oral Daily   . vancomycin  1,250 mg Intravenous Q12H   . lidocaine  5 mL Intradermal Once   . cefepime  2 g Intravenous Q12H     Continuous Infusions:  . dextrose       PRN Meds:sodium chloride flush, ondansetron, polyethylene glycol, glucose, dextrose, glucagon (rDNA), dextrose, oxyCODONE, oxyCODONE, HYDROmorphone    Review of Systems   Constitutional: Negative for chills and fever.   Respiratory: Negative for cough and shortness of breath.    Cardiovascular: Negative for chest pain.   Gastrointestinal: Negative for abdominal pain.   Skin: Positive for color change and wound.   Neurological:        Neuropathy    Psychiatric/Behavioral: The patient is nervous/anxious.        Interval Pertinent History:  Social History   Substance Use Topics   . Smoking status: Current Every Day Smoker     Packs/day: 1.00     Years: 30.00   . Smokeless tobacco: Never Used      Comment: nicotene patch requested   . Alcohol use No      Comment: 6 pack per yr now; former 6-12 pk per day for 10 yrs        Objective:     Patient Vitals for the past 24 hrs:   BP Temp Temp src Pulse Resp SpO2 Height Weight   10/06/17 0951 104/60 97.6 F (36.4 C) Temporal 60 - 97 % - -   10/06/17 0915 132/78 - - 65 20 98 % - -   10/06/17 0853 123/72 97.2 F (36.2 C) - 65 18 98 % - -   10/06/17 0845 123/71 - - 65 20 98 % - -   10/06/17 0830 130/76 - - 65 18 98 % - -   10/06/17 0824 122/76 97 F (36.1 C) Temporal 68 20 99 % - -   10/06/17 0714 97/64 - - - - - - -   10/06/17 0712 - 98 F (36.7 C) Temporal 59 16 95 % 5\' 11"  (1.803 m) 175 lb (79.4 kg)   10/05/17 1942 125/75 97.9 F (36.6 C) Oral 78 12 98 % - -   10/05/17 1249 112/70 - - 70 16 98 % - -   10/05/17 1125 118/72 97.5 F (36.4 C) Oral 69 20 100 % - -  Average, Min, and Max for last 24 hours Vitals:  TEMPERATURE:  Temp  Avg: 97.5 F (36.4 C)  Min: 97 F (36.1 C)  Max: 98 F (36.7 C)  RESPIRATIONS RANGE: Resp  Avg: 17.8  Min: 12  Max: 20  PULSE RANGE: Pulse  Avg: 66.4  Min: 59  Max: 78  BLOOD PRESSURE RANGE:  Systolic (24hrs), Avg:119 , Min:97 , Max:132   ; Diastolic (24hrs), Avg:71, Min:60, Max:78  PULSE OXIMETRY RANGE: SpO2  Avg: 97.9 %  Min: 95 %  Max: 100 %    I/O last 3 completed shifts:  In: 240 [P.O.:240]  Out: -     Physical Exam   Constitutional: He is oriented to person, place, and time. He appears well-developed and well-nourished.   Patient mildly anxious, appears uncomfortable    HENT:   Head: Normocephalic and atraumatic.   Right Ear: External ear normal.   Left Ear: External ear normal.   Nose: Nose normal.   Poor dentition, dry oral mucous membrane   Eyes: Pupils are equal, round, and reactive to light. Conjunctivae are normal. No scleral icterus.   Neck: Neck supple.   Cardiovascular: Normal rate, regular rhythm, normal heart sounds and intact distal pulses.    No murmur heard.  Radial and DP pulses 2+   Pulmonary/Chest: Effort normal and breath sounds normal. He has no wheezes.   Abdominal: Soft. Bowel sounds are normal. He exhibits no  distension.   Musculoskeletal: He exhibits no edema (in lower extremities b/l).   Neurological: He is alert and oriented to person, place, and time.   Skin:   Multiple areas with dressings left arm, right arm, scalp, right and left lower leg. Not examined   Psychiatric: He has a normal mood and affect. His behavior is normal.   Mildly anxious   Vitals reviewed.      Lab Results   Component Value Date    WBC 10.9 (H) 10/06/2017    HGB 11.9 (L) 10/06/2017    HCT 35.3 (L) 10/06/2017    MCV 82.4 10/06/2017    PLT 273 10/06/2017     Lab Results   Component Value Date    NA 136 10/06/2017    K 4.8 10/06/2017    CL 103 10/06/2017    CO2 24 10/06/2017    BUN 21 10/06/2017    CREATININE 0.54 10/06/2017    GLUCOSE 276 10/06/2017    CALCIUM 8.9 10/06/2017        Lab Results   Component Value Date    LABA1C 7.2 (H) 10/06/2017      Additional results of the last 24 hours have been reviewed.    Assessment and Plan:         Active Problems:    Hepatitis C, Ab + no rna testing    IVDU (intravenous drug user)    Type 2 diabetes mellitus with diabetic neuropathy (HCC)    MRSA (methicillin resistant Staphylococcus aureus) infection    Polysubstance abuse (HCC)    Cellulitis of left upper extremity    Abscess  Resolved Problems:    * No resolved hospital problems. *      1. Cellulitis and abscess of left arm  -multiple sites of skin wounds/abscesses - blood cultures pending. Pt had multiple I&Ds in OR today with cultures obtained.  -continue vanc + cefipime --BC negative, will await cultures from OR and sensitivities   -history of MRSA, likely another MRSA infection    2. Polysubstance abuse   -  IV heroin user, admits to marijuana and remote amphetamine usage  -check urine tox-needs to be collected  -consult ADM    3. Chronic Hep C infection  -not treated due to poor follow up compliance and continuing IVDA   -LFTs WNL    4. DM2 with neuropathy    A1C 7.2, not on any home meds  -BS in 200s  -continue SSI and ACHS BS checks     5.  Pain  -oxycodone 10mg  prn and toradol scheduled. IV dilaudid PRN- pt states this is all that helps his pain  -was on lyrica for neuropathic pain and states it was effective, will discuss adding to regimen     DVTProphylaxis: lovenox 40 q 24hr - creatinine clearance >30  Disposition:await test results, await consultant recommendations and await clinical improvement  I spent over 51% of total time providing counseling or incoordination of care: > NA   patient and family updated, I personally examined the patient and I personally reviewed chart, data, labs radiology reports    6AM-6PM please page: [phone], Hulen Luster, PA-C  Electronically signed by Robley Fries, PA-C on 10/06/17 at 11:17 AM    6PM-6AM please page:  [facility]

## 2017-10-07 LAB — POCT GLUCOSE
POC Glucose: 310 mg/dL — ABNORMAL HIGH (ref 70–100)
POC Glucose: 248 mg/dL — ABNORMAL HIGH (ref 70–100)
POC Glucose: 284 mg/dL — ABNORMAL HIGH (ref 70–100)
POC Glucose: 241 mg/dL — ABNORMAL HIGH (ref 70–100)

## 2017-10-07 LAB — VANCOMYCIN LEVEL, TROUGH: Vancomycin Tr: 7.6 ug/mL — ABNORMAL LOW (ref 15.0–20.0)

## 2017-10-07 MED ORDER — GABAPENTIN 100 MG PO CAPS
100 MG | Freq: Three times a day (TID) | ORAL | Status: DC
Start: 2017-10-07 — End: 2017-10-08
  Administered 2017-10-07 – 2017-10-08 (×5): 100 mg via ORAL

## 2017-10-07 MED ORDER — DEXTROSE 5 % IV SOLN
5 % | Freq: Two times a day (BID) | INTRAVENOUS | Status: DC
Start: 2017-10-07 — End: 2017-10-08
  Administered 2017-10-08: 04:00:00 1750 mg via INTRAVENOUS

## 2017-10-07 MED ORDER — MUPIROCIN 2 % EX OINT
2 % | Freq: Two times a day (BID) | CUTANEOUS | Status: DC
Start: 2017-10-07 — End: 2017-10-08
  Administered 2017-10-08 (×2): via TOPICAL

## 2017-10-07 MED FILL — GABAPENTIN 100 MG PO CAPS: 100 mg | ORAL | Qty: 1

## 2017-10-07 MED FILL — OXYCODONE HCL 5 MG PO TABS: 5 mg | ORAL | Qty: 2

## 2017-10-07 MED FILL — BACLOFEN 10 MG PO TABS: 10 mg | ORAL | Qty: 1

## 2017-10-07 MED FILL — MAXIPIME 2 G IJ SOLR: 2 g | INTRAMUSCULAR | Qty: 2

## 2017-10-07 MED FILL — MAPAP 500 MG PO TABS: 500 mg | ORAL | Qty: 2

## 2017-10-07 MED FILL — ENOXAPARIN SODIUM 40 MG/0.4ML SC SOLN: 40 MG/0.4ML | SUBCUTANEOUS | Qty: 0.4

## 2017-10-07 MED FILL — KETAMINE HCL 10 MG/ML IJ SOLN: 10 mg/mL | INTRAMUSCULAR | Qty: 3.6

## 2017-10-07 MED FILL — HYDROMORPHONE HCL 1 MG/ML IJ SOLN: 1 mg/mL | INTRAMUSCULAR | Qty: 1

## 2017-10-07 MED FILL — MUPIROCIN 2 % EX OINT: 2 % | CUTANEOUS | Qty: 22

## 2017-10-07 MED FILL — NICOTINE 14 MG/24HR TD PT24: 14 mg/(24.h) | TRANSDERMAL | Qty: 1

## 2017-10-07 MED FILL — VANCOMYCIN HCL 10 G IV SOLR: 10 g | INTRAVENOUS | Qty: 1250

## 2017-10-07 MED FILL — DOCUSATE SODIUM 100 MG PO CAPS: 100 mg | ORAL | Qty: 1

## 2017-10-07 MED FILL — VANCOMYCIN HCL 10 G IV SOLR: 10 g | INTRAVENOUS | Qty: 1750

## 2017-10-07 NOTE — Progress Notes (Signed)
Pharmacy Managed Vancomycin Dosing Service Progress Note  Date: 10/07/17 Room:1704/170401  Patient Name: Mason Garza  MRN: [id number]     Allergies: Patient has no known allergies.  Age: 52 y.o. Sex: male   Ht: Height: 5\' 11"  (180.3 cm)  TBW: Weight: 175 lb (79.4 kg)   BMI: Body mass index is 24.41 kg/m.  DW: 79.4 kg  Calculated CrCl: >80 mL/min    Lab Results   Component Value Date    CREATININE 0.54 10/06/2017    CREATININE 0.55 10/05/2017    CREATININE 0.75 06/08/2017    BUN 21 (H) 10/06/2017    BUN 19 10/05/2017    BUN 16 06/08/2017    WBC 10.9 (H) 10/06/2017    WBC 8.5 10/05/2017    WBC 6.2 06/08/2017       Infectious Diagnosis: Cellulitis (goal trough = 10-15 mcg/mL)  Antimicrobials:  Vancomycin 1250 mg Q12H    Assessment/Plan:  Vancomycin trough prior to fourth dose resulted at 7.6. Based on trough level and diagnosis will change dose to Vancomycin 1750 mg Q12H. Will check another trough prior to fourth dose of this regimen.     Please page/call with questions.  Date: 10/07/17 Time: 4:10 PM      _______________________________________  Name: Eugenie FillerAmanda Garielle Mroz, PharmD  Phone: [phone]

## 2017-10-07 NOTE — Care Coordination-Inpatient (Signed)
Continues on IV antibiotics and has dressing changes BID.  Unsure if he needs long term antibiotics.  Currently is self pay, [employer] assisting.  Will continue to monitor.Electronically signed by Loran SentersSusan Healey-Fast, RN on 10/07/2017 at 11:54 AM

## 2017-10-07 NOTE — Care Coordination-Inpatient (Signed)
S/W following, did attempt to see patient, patient sleeping soundly.  [employer] has started Medicaid process, no pending number as of yet.   Awaiting to see if patient needs IV Abx, if so, will need SNF placement. I will continue to follow for resources and planning.  Electronically signed by Laurette SchimkeAndrea Clea Dubach, LSW on 10/07/17 at 4:22 PM

## 2017-10-07 NOTE — Progress Notes (Signed)
Nutrition rescreen completed. Chart reviewed. Patient to be monitored and followed by the diet technician. Dietitian available upon request.

## 2017-10-07 NOTE — Progress Notes (Signed)
[name] Progress Note        Mason WEIDEMAN  DOB:  11-Dec-1965(52 y.o.)  MRN:  [id number]    Date: 10/07/17   Subjective:    HPI  The patient complains of multiple abscesses. Patient feels improved today. States he slept well. Complains of soreness in abscess sites. Baclofen helped pain in his legs. Denies any n/v, f/c, CP, SOB. Tolerating abx well.    Scheduled Meds:  . traZODone  100 mg Oral Nightly   . insulin glargine  10 Units Subcutaneous Nightly   . baclofen  10 mg Oral TID   . sodium chloride flush  10 mL Intravenous 2 times per day   . enoxaparin  40 mg Subcutaneous Daily   . nicotine  1 patch Transdermal Daily   . acetaminophen  1,000 mg Oral 3 times per day   . insulin lispro  0-6 Units Subcutaneous TID WC   . insulin lispro  0-3 Units Subcutaneous Nightly   . docusate sodium  100 mg Oral Daily   . vancomycin  1,250 mg Intravenous Q12H   . lidocaine  5 mL Intradermal Once   . cefepime  2 g Intravenous Q12H     Continuous Infusions:  . dextrose       PRN Meds:tiZANidine, sodium chloride flush, ondansetron, polyethylene glycol, glucose, dextrose, glucagon (rDNA), dextrose, oxyCODONE, oxyCODONE, HYDROmorphone    Review of Systems   Respiratory: Negative for shortness of breath.    Skin: Positive for color change and wound.   Neurological:        Neuropathy    Psychiatric/Behavioral: The patient is nervous/anxious.        Interval Pertinent History:  Social History   Substance Use Topics   . Smoking status: Current Every Day Smoker     Packs/day: 1.00     Years: 30.00   . Smokeless tobacco: Never Used      Comment: nicotene patch requested   . Alcohol use No      Comment: 6 pack per yr now; former 6-12 pk per day for 10 yrs       Objective:     Patient Vitals for the past 24 hrs:   BP Temp Temp src Pulse Resp SpO2   10/07/17 0738 104/65 97.7 F (36.5 C) Temporal 58 12 97 %   10/06/17 1933 111/70 97.9 F (36.6 C) Oral 72 18 98 %   10/06/17 0951 104/60 97.6 F (36.4 C) Temporal 60 - 97 %        Average, Min, and Max for last 24 hours Vitals:  TEMPERATURE:  Temp  Avg: 97.7 F (36.5 C)  Min: 97.6 F (36.4 C)  Max: 97.9 F (36.6 C)  RESPIRATIONS RANGE: Resp  Avg: 15  Min: 12  Max: 18  PULSE RANGE: Pulse  Avg: 63.3  Min: 58  Max: 72  BLOOD PRESSURE RANGE:  Systolic (24hrs), Avg:106 , Min:104 , Max:111   ; Diastolic (24hrs), Avg:65, Min:60, Max:70  PULSE OXIMETRY RANGE: SpO2  Avg: 97.3 %  Min: 97 %  Max: 98 %    I/O last 3 completed shifts:  In: 350 [I.V.:350]  Out: 615 [Urine:600; Blood:15]    Physical Exam   Constitutional: He is oriented to person, place, and time. He appears well-developed and well-nourished.   Patient mildly anxious, appears uncomfortable    HENT:   Head: Normocephalic and atraumatic.   Right Ear: External ear normal.   Left Ear: External ear normal.  Nose: Nose normal.   Poor dentition   Eyes: Pupils are equal, round, and reactive to light. Conjunctivae are normal. No scleral icterus.   Neck: Neck supple.   Cardiovascular: Normal rate, regular rhythm, normal heart sounds and intact distal pulses.    No murmur heard.  Radial and DP pulses 2+   Pulmonary/Chest: Effort normal and breath sounds normal. He has no wheezes.   Abdominal: Soft. Bowel sounds are normal. He exhibits no distension.   Musculoskeletal: He exhibits no edema (in lower extremities b/l).   Neurological: He is alert and oriented to person, place, and time.   Skin:   Multiple areas with dressings left arm, right arm, scalp, right and left lower leg. Not examined   Psychiatric: He has a normal mood and affect. His behavior is normal.   Mildly anxious   Vitals reviewed.      Lab Results   Component Value Date    WBC 10.9 (H) 10/06/2017    HGB 11.9 (L) 10/06/2017    HCT 35.3 (L) 10/06/2017    MCV 82.4 10/06/2017    PLT 273 10/06/2017     Lab Results   Component Value Date    NA 136 10/06/2017    K 4.8 10/06/2017    CL 103 10/06/2017    CO2 24 10/06/2017    BUN 21 10/06/2017    CREATININE 0.54 10/06/2017    GLUCOSE 276  10/06/2017    CALCIUM 8.9 10/06/2017        Lab Results   Component Value Date    LABA1C 7.2 (H) 10/06/2017      Additional results of the last 24 hours have been reviewed.    Assessment and Plan:         Active Problems:    Hepatitis C, Ab + no rna testing    IVDU (intravenous drug user)    Type 2 diabetes mellitus with diabetic neuropathy (HCC)    Severe opioid use disorder (HCC)    MRSA (methicillin resistant Staphylococcus aureus) infection    Polysubstance abuse (HCC)    Cellulitis of left upper extremity    Abscess  Resolved Problems:    * No resolved hospital problems. *      1. Cellulitis and abscess of left arm  -multiple sites of skin wounds/abscesses - blood cultures negative so far  -s/p I&D of multiple abscesses 2/19. Dressing changed this am by surgery. .  -continue vanc + cefipime - will await cultures from OR and sensitivities   -history of MRSA, likely another MRSA infection    2. Polysubstance abuse   -IV heroin user, admits to marijuana and remote amphetamine usage  -ADM recs reviewed, pt with poor social situation and compliance, appreciate help from SW/CM regarding d/c plans, need for ride coordination and insurance coverage. Minimize use of IV dilaudid    3. Chronic Hep C infection  -not treated due to poor follow up compliance and continuing IVDA   -LFTs WNL    4. DM2 with neuropathy    -A1C 7.2, not on any home meds-will benefit from metformin, pt endorses poor diet  -BS in 200s-300s  -continue SSI and ACHS BS checks--Lantus 10U started last night.      5. Pain  -oxycodone 10mg  prn and toradol scheduled. IV dilaudid PRN-   -Baclofen added by ADM, added on gabapentin for neuropathy    DVTProphylaxis: lovenox 40 q 24hr - creatinine clearance >30  Disposition:await test results, await consultant recommendations and await clinical  improvement  I spent over 51% of total time providing counseling or incoordination of care: > NA   patient and family updated, I personally examined the patient  and I personally reviewed chart, data, labs radiology reports    6AM-6PM please page: [phone], Hulen Luster, PA-C  Electronically signed by Robley Fries, PA-C on 10/07/17 at 9:52 AM    6PM-6AM please page:  [facility]

## 2017-10-07 NOTE — Progress Notes (Signed)
Department of Surgery - Progress Note -  Surg 1    PATIENT NAME: Mason Garza    DOB:  03/31/1966    MRN:  [id number]  ATTENDING PHYSICIAN:  Esaw GrandchildBryan Patrick O'Connell,*  ADMIT DATE:  10/05/2017    TODAY'S DATE:  10/07/2017    SUBJECTIVE  All sites redressed overnight. Patient feeling sore, but improved, tolerable with pain medications.    OBJECTIVE    VITALS:  BP 104/65   Pulse 58   Temp 97.7 F (36.5 C) (Temporal)   Resp 12   Ht 5\' 11"  (1.803 m)   Wt 175 lb (79.4 kg)   SpO2 97%   BMI 24.41 kg/m     INTAKE/OUTPUT:    I/O last 3 completed shifts:  In: 350 [I.V.:350]  Out: 615 [Urine:600; Blood:15]  No intake/output data recorded.    CONSTITUTIONAL: NAD, A&O X3.   HENT:  scalp wound c/d/i, minimal drainage  LUNGS: Resp effort easy and unlabored  ABDOMEN: soft ND/NT  BUE: LUE: wound c/d/i, erythema and swelling improved minimal drainage, RUE: elbow, forearm and wrist wounds all c/d/i swelling and erythema improved  BLE: All 4 I&d sites clean and improved from previous, left I&d c/d/i     Data  Recent Labs      10/05/17   0828  10/06/17   0423   WBC  8.5  10.9*   HGB  12.2*  11.9*   HCT  36.8*  35.3*   PLT  253  273     Recent Labs      10/05/17   0828  10/06/17   0423   NA  137  136   K  4.2  4.8   CL  103  103   CO2  25  24   BUN  19  21*   CREATININE  0.55  0.54   GLUCOSE  188*  276*     Recent Labs      10/05/17   0828   AST  26   ALT  29   BILITOT  0.5   ALKPHOS  118         Current Inpatient Medications    Current Facility-Administered Medications: traZODone (DESYREL) tablet 100 mg, 100 mg, Oral, Nightly  insulin glargine (LANTUS) injection vial 10 Units, 10 Units, Subcutaneous, Nightly  baclofen (LIORESAL) tablet 10 mg, 10 mg, Oral, TID  tiZANidine (ZANAFLEX) tablet 4 mg, 4 mg, Oral, Q6H PRN  sodium chloride flush 0.9 % injection 10 mL, 10 mL, Intravenous, 2 times per day  sodium chloride flush 0.9 % injection 10 mL, 10 mL, Intravenous, PRN  enoxaparin (LOVENOX) injection 40 mg, 40 mg, Subcutaneous,  Daily  ondansetron (ZOFRAN) injection 4 mg, 4 mg, Intravenous, Q6H PRN  nicotine (NICODERM CQ) 14 MG/24HR 1 patch, 1 patch, Transdermal, Daily  acetaminophen (TYLENOL) tablet 1,000 mg, 1,000 mg, Oral, 3 times per day  insulin lispro (HUMALOG) injection vial 0-6 Units, 0-6 Units, Subcutaneous, TID WC  insulin lispro (HUMALOG) injection vial 0-3 Units, 0-3 Units, Subcutaneous, Nightly  docusate sodium (COLACE) capsule 100 mg, 100 mg, Oral, Daily  polyethylene glycol (GLYCOLAX) packet 17 g, 17 g, Oral, Daily PRN  glucose (GLUTOSE) 40 % oral gel 15 g, 15 g, Oral, PRN  dextrose 50 % solution 12.5 g, 12.5 g, Intravenous, PRN  glucagon (rDNA) injection 1 mg, 1 mg, Intramuscular, PRN  dextrose 5 % solution, 100 mL/hr, Intravenous, PRN  vancomycin (VANCOCIN) 1,250 mg in dextrose 5 % 250 mL IVPB,  1,250 mg, Intravenous, Q12H  oxyCODONE (ROXICODONE) immediate release tablet 5 mg, 5 mg, Oral, Q6H PRN  oxyCODONE (ROXICODONE) immediate release tablet 10 mg, 10 mg, Oral, Q4H PRN  HYDROmorphone (DILAUDID) injection 1 mg, 1 mg, Intravenous, Q4H PRN  lidocaine 1 % injection 5 mL, 5 mL, Intradermal, Once  cefepime (MAXIPIME) 2 g IVPB extended (mini-bag), 2 g, Intravenous, Q12H    ASSESSMENT AND PLAN    52 y.o. male with multiple abscesses of the legs, arms and scalp    -okay for carb control diet  -pain control as need with PO pain medications  -cont antibiotics per primary team  -BID dressing changes with mucopuricin/adaptic and dry gauze   -will sign off continue care per primary     Electronically signed by Tillie Fantasia, MD on 10/06/17 at 8:50 AM  PGY-4  General surgery

## 2017-10-08 LAB — CBC
Hematocrit: 37.5 % — ABNORMAL LOW (ref 40.0–52.0)
Hemoglobin: 12.4 g/dL — ABNORMAL LOW (ref 13.0–18.0)
MCH: 27.6 pg (ref 26.0–34.0)
MCHC: 33.2 % (ref 32.0–36.0)
MCV: 83.1 fL (ref 80.0–98.0)
MPV: 7 fL — ABNORMAL LOW (ref 7.4–10.4)
Platelets: 288 10*3/uL (ref 140–440)
RBC: 4.51 10*6/uL (ref 4.40–5.90)
RDW: 14.3 % (ref 11.5–14.5)
WBC: 5.3 10*3/uL (ref 3.6–10.7)

## 2017-10-08 LAB — BASIC METABOLIC PANEL
Anion Gap: 2 NA
BUN: 16 mg/dL (ref 7–20)
CO2: 31 mmol/L — ABNORMAL HIGH (ref 22–30)
Calcium: 8.7 mg/dL (ref 8.4–10.4)
Chloride: 104 mmol/L (ref 98–107)
Creatinine: 0.56 mg/dL (ref 0.52–1.25)
EGFR IF NonAfrican American: >60 mL/min (ref >60)
Glucose: 237 mg/dL — ABNORMAL HIGH (ref 70–100)
Potassium: 5.9 mmol/L — ABNORMAL HIGH (ref 3.5–5.1)
Sodium: 137 mmol/L (ref 135–145)
eGFR African American: >60 mL/min (ref >60)

## 2017-10-08 LAB — POCT GLUCOSE
POC Glucose: 322 mg/dL — ABNORMAL HIGH (ref 70–100)
POC Glucose: 224 mg/dL — ABNORMAL HIGH (ref 70–100)
POC Glucose: 212 mg/dL — ABNORMAL HIGH (ref 70–100)

## 2017-10-08 MED ORDER — HYDROMORPHONE HCL 1 MG/ML IJ SOLN
1 MG/ML | INTRAMUSCULAR | Status: DC | PRN
Start: 2017-10-08 — End: 2017-10-08

## 2017-10-08 MED ORDER — SULFAMETHOXAZOLE-TRIMETHOPRIM 800-160 MG PO TABS
800-160 MG | ORAL_TABLET | ORAL | 0 refills | Status: DC
Start: 2017-10-08 — End: 2017-10-16

## 2017-10-08 MED ORDER — SULFAMETHOXAZOLE-TRIMETHOPRIM 800-160 MG PO TABS
800-160 MG | ORAL_TABLET | Freq: Every day | ORAL | 0 refills | Status: DC
Start: 2017-10-08 — End: 2017-10-08

## 2017-10-08 MED ORDER — BACLOFEN 10 MG PO TABS
10 MG | ORAL_TABLET | Freq: Three times a day (TID) | ORAL | 0 refills | Status: DC
Start: 2017-10-08 — End: 2017-10-16

## 2017-10-08 MED ORDER — AMOXICILLIN 500 MG PO CAPS
500 MG | ORAL_CAPSULE | Freq: Two times a day (BID) | ORAL | 0 refills | Status: AC
Start: 2017-10-08 — End: 2017-10-15

## 2017-10-08 MED ORDER — GABAPENTIN 100 MG PO CAPS
100 MG | ORAL_CAPSULE | Freq: Three times a day (TID) | ORAL | 0 refills | Status: DC
Start: 2017-10-08 — End: 2017-10-16

## 2017-10-08 MED ORDER — OXYCODONE HCL 10 MG PO TABS
10 MG | ORAL_TABLET | Freq: Four times a day (QID) | ORAL | 0 refills | Status: AC | PRN
Start: 2017-10-08 — End: 2017-10-13

## 2017-10-08 MED ORDER — BACLOFEN 10 MG PO TABS
10 MG | ORAL_TABLET | Freq: Three times a day (TID) | ORAL | 0 refills | Status: DC
Start: 2017-10-08 — End: 2017-10-08

## 2017-10-08 MED ORDER — GABAPENTIN 100 MG PO CAPS
100 MG | ORAL_CAPSULE | Freq: Three times a day (TID) | ORAL | 0 refills | Status: DC
Start: 2017-10-08 — End: 2017-10-08

## 2017-10-08 MED FILL — OXYCODONE HCL 5 MG PO TABS: 5 mg | ORAL | Qty: 2

## 2017-10-08 MED FILL — BACLOFEN 10 MG PO TABS: 10 mg | ORAL | Qty: 1

## 2017-10-08 MED FILL — MAPAP 500 MG PO TABS: 500 mg | ORAL | Qty: 2

## 2017-10-08 MED FILL — VANCOMYCIN HCL 1 G IV SOLR: 1 g | INTRAVENOUS | Qty: 1750

## 2017-10-08 MED FILL — GABAPENTIN 100 MG PO CAPS: 100 mg | ORAL | Qty: 1

## 2017-10-08 MED FILL — HYDROMORPHONE HCL 1 MG/ML IJ SOLN: 1 mg/mL | INTRAMUSCULAR | Qty: 1

## 2017-10-08 MED FILL — MUPIROCIN 2 % EX OINT: 2 % | CUTANEOUS | Qty: 22

## 2017-10-08 MED FILL — TRAZODONE HCL 50 MG PO TABS: 50 mg | ORAL | Qty: 2

## 2017-10-08 MED FILL — NICOTINE 14 MG/24HR TD PT24: 14 mg/(24.h) | TRANSDERMAL | Qty: 1

## 2017-10-08 MED FILL — ENOXAPARIN SODIUM 40 MG/0.4ML SC SOLN: 40 MG/0.4ML | SUBCUTANEOUS | Qty: 0.4

## 2017-10-08 NOTE — Discharge Summary (Signed)
[facility] Discharge Summary and Transition Note       Mason Garza  DOB:  07/28/1966  MRN:  [id number]    ADMIT DATE:  10/05/2017  DISCHARGE DATE:  10/08/2017    PRIMARY CARE PHYSICIAN:  Ellene Route, MD    VISIT STATUS: Admission    CODE STATUS:  Full Code    DISCHARGE DIAGNOSES:    Active Problems:    Hepatitis C, Ab + no rna testing    IVDU (intravenous drug user)    Type 2 diabetes mellitus with diabetic neuropathy (HCC)    Severe opioid use disorder (HCC)    MRSA (methicillin resistant Staphylococcus aureus) infection    Polysubstance abuse (HCC)    Cellulitis of left upper extremity    Abscess  Resolved Problems:    * No resolved hospital problems. *      HOSPITAL COURSE:  Patient is a 81 y/oM with PMHx of polysubstance drug abuse, DMII with neuropathy, chronic hep C, and MRSA cellulitis/abscess who presents with complaints of multiple skin abscesses on arms, legs, and scalp. Patient admits to noticing them for several weeks and expressing some purulent drainage, but they have subsequently scabbed over. He endorses pain and tenderness to a fluctuant left forearm abscess that was at a site of IV injection. Last heroin usage on Saturday. He endorses subjective fevers and chills at home for the past few days. He denies any n/v, CP, SOB, cough, abdominal pain, or dysuria. He also admits to tobacco and marijuana use as well as amphetamines. He denies any alcohol use. He lives at home with his brother and is not currently working.      Of note, he was admitted for a right lower leg abscess in 05/2017 and treated with bedside I&D as well as IV vanc which was switched to keflex and bactrim was added as he was MRSA positive. He is noncompliant with follow up appointments and states he hasn't taken any prescribed medications in awhile. In the ED VSS, lab work up was unremarkable with normal WBC. BCx were drawn and patient was given a dose of vancomycin and admitted for further management. He was treated  with IV vanc and cefipime.     General surgery was consulted and took patient to OR on 2/19 for I&D of multiple sites (left arm, right wrist and elbow, scalp, right knee, left lateral lower leg).  Pain was treated with schedule IV toradol, IV dilaudid PRN, and PO oxycodone PRN. Baclofen and gabapentin were effective in controlling his neuropathic pain which he states is one of the main reasons why he uses heroin. ADM evaluated patient and recommended inpatient drug rehab due to his polysubstance abuse but in his current state and lack of insurance this was not an option even if he did agree to go to treatement. Patient was hyperglycemic likely 2/2 steroids and surgery, and was treated with SSI and nightly lantus with adequate control. We discussed the importance of diabetes control to help with neuropathy, but patient states he can't afford any medications and with history of compliance, patient wasn't started on metformin on discharge.     BCx remained negative and wound cultures grew MRSA, strep pyogenes, and enterobacter. Sensitivities were still pending for enterobacter at discharge but Abx stewardship said Bactrim should cover. He was educated on dressing changes and wound care prior to discharge. SW was active in helping patient afford medications. He was discharged with Bactrim DS two tabs BID and amoxicillin BID  for one week. He was also sent with prescriptions for baclofen, gabapentin, and oxycodone (20 tabs) for pain control.  Pt has follow up on 10/16/17.    PROCEDURES:  I&D in OR on 2/19    CONSULTANTS:  ADM- Dr. Tamsen RoersVellanki   General Surgery- Dr. Hessie DienerBender     DISCHARGE MEDICATIONS:        Significant Medication Changes: gabapentin, baclofen, oxycodone, bactrim and amoxicillin x 7d     Alphia KavaWilliams, Gilbert R   Home Medication Instructions ZOX:[id number]    Printed on:10/08/17 1242   Medication Information                      amoxicillin (AMOXIL) 500 MG capsule  Take 1 capsule by mouth 2 times daily for 7 days              baclofen (LIORESAL) 10 MG tablet  Take 1 tablet by mouth 3 times daily             gabapentin (NEURONTIN) 100 MG capsule  Take 1 capsule by mouth 3 times daily for 180 days. Intended supply: 30 days.             naproxen (NAPROSYN) 500 MG tablet  Take 1 tablet by mouth 2 times daily as needed for Pain             oxyCODONE (OXY-IR) 10 MG immediate release tablet  Take 1 tablet by mouth every 6 hours as needed for Pain for up to 5 days. Take 1/2 tablet for moderate pain, 1 full tab for severe pain.             sulfamethoxazole-trimethoprim (BACTRIM DS;SEPTRA DS) 800-160 MG per tablet  Take 2 tablets by mouth two times daily x 7 days                 DIET: carb controlled    ACTIVITY: resume regular activity    SIGNIFICANT DIAGNOSTIC STUDIES:  None    COMPLEXITY OF FOLLOW UP:   [x]  Moderate Complexity: follow up within 7-14 calendar days ([id number])   []  Severe Complexity: follow up within 7 calendar days ([id number])    FOLLOW UP TESTING, PENDING RESULTS OR REFERRALS AT TRANSITIONAL CARE VISIT:    [x]   Yes Follow up on wound cultures sensitivity to ensure abx coverage for enterobacter   []   No    DISPOSITION: Home    FACILITY/HOME CARE AGENCY NAME: None    Follow up with Ellene RouteNaomi Tyree, MD scheduled  --Bonnita NasutiLauren Morlan, CNP    Notification (telephone encounter) to PCP initiated:    [x]   Yes    []   No    INSTRUCTIONS TO MA/SW: Please call patient on day after discharge (must document patient  contacted within 2 business days of discharge).    FOLLOW UP QUESTIONS FOR MA/SW:  1. Did you get medications filled and taking them as instructed from discharge?  2. Are you following your discharge instructions from your hospital stay?  3. Please confirm patient is scheduled for a follow up appointment within the above time frame.    DISCHARGE TIME: > 30 minutes    Electronically signed by Robley FriesAndrea L Hobson, PA-C on 10/08/17 at 12:42 PM

## 2017-10-08 NOTE — Telephone Encounter (Signed)
FYI-  Patient stated in the past due to transportation issues he could not make appointments.  He stated he wanted to go back to the [place] office because he will be living near there. Encouraged patient to call pcp office if he is unable to make the appointment. Verbalized understanding.              Discharge Summaries  Date of Service: 10/08/2017 12:42 PM  Robley Fries, PA-C   Internal Medicine   Cosigned by: [name], MD at 10/08/2017 3:33 PM   Attestation signed by [name], MD at 10/08/2017 3:33 PM   Agree with discharge summary. See progress note from 10/08/17 for attending comments.         [] Manual[] Template  [] Copied  [facility] Discharge Summary and Transition Note            Mason Garza                    DOB:  Apr 14, 1966                     MRN:  [id number]    ADMIT DATE:  10/05/2017                  DISCHARGE DATE:  10/08/2017    PRIMARY CARE PHYSICIAN:  Ellene Route, MD    VISIT STATUS: Admission    CODE STATUS:  Full Code    DISCHARGE DIAGNOSES:    Active Problems:    Hepatitis C, Ab + no rna testing    IVDU (intravenous drug user)    Type 2 diabetes mellitus with diabetic neuropathy (HCC)    Severe opioid use disorder (HCC)    MRSA (methicillin resistant Staphylococcus aureus) infection    Polysubstance abuse (HCC)    Cellulitis of left upper extremity    Abscess  Resolved Problems:    * No resolved hospital problems. *      HOSPITAL COURSE:  Patient is a 32 y/oM with PMHx of polysubstance drug abuse, DMII with neuropathy, chronic hep C, and MRSA cellulitis/abscess who presents with complaints of multiple skin abscesses on arms, legs, and scalp. Patient admits to noticing them for several weeks and expressing some purulent drainage, but they have subsequently scabbed over. He endorses pain and tenderness to a fluctuant left forearm abscess that was at a site of IV injection. Last heroin usage on Saturday. He endorses subjective fevers and chills  at home for the past few days. He denies any n/v, CP, SOB, cough, abdominal pain, or dysuria. He also admits to tobacco and marijuana use as well as amphetamines. He denies any alcohol use. He lives at home with his brother and is not currently working.     Of note, he was admitted for a right lower leg abscess in 05/2017 and treated with bedside I&D as well as IV vanc which was switched to keflex and bactrim was added as he was MRSA positive. He is noncompliant with follow up appointments and states he hasn't taken any prescribed medications in awhile. In the ED VSS, lab work up was unremarkable with normal WBC. BCx were drawn and patient was given a dose of vancomycin and admitted for further management. He was treated with IV vanc and cefipime.     General surgery was consulted and took patient to OR on 2/19 for I&D of multiple sites (left arm, right wrist and elbow, scalp, right knee, left  lateral lower leg).  Pain was treated with schedule IV toradol, IV dilaudid PRN, and PO oxycodone PRN. Baclofen and gabapentin were effective in controlling his neuropathic pain which he states is one of the main reasons why he uses heroin. ADM evaluated patient and recommended inpatient drug rehab due to his polysubstance abuse but in his current state and lack of insurance this was not an option even if he did agree to go to treatement. Patient was hyperglycemic likely 2/2 steroids and surgery, and was treated with SSI and nightly lantus with adequate control. We discussed the importance of diabetes control to help with neuropathy, but patient states he can't afford any medications and with history of compliance, patient wasn't started on metformin on discharge.     BCx remained negative and wound cultures grew MRSA, strep pyogenes, and enterobacter. Sensitivities were still pending for enterobacter at discharge but Abx stewardship said Bactrim should cover. He was educated on dressing changes and wound care prior to  discharge. SW was active in helping patient afford medications. He was discharged with Bactrim DS two tabs BID and amoxicillin BID for one week. He was also sent with prescriptions for baclofen, gabapentin, and oxycodone (20 tabs) for pain control.  Pt has follow up on 10/16/17.    PROCEDURES:  I&D in OR on 2/19    CONSULTANTS:  ADM- Dr. Tamsen RoersVellanki   General Surgery- Dr. Hessie DienerBender     DISCHARGE MEDICATIONS:        Significant Medication Changes: gabapentin, baclofen, oxycodone, bactrim and amoxicillin x 7d                 Alphia KavaWilliams, Raford R   Home Medication Instructions [id number]    Printed on:10/08/17 1242   Medication Information                      amoxicillin (AMOXIL) 500 MG capsule  Take 1 capsule by mouth 2 times daily for 7 days             baclofen (LIORESAL) 10 MG tablet  Take 1 tablet by mouth 3 times daily             gabapentin (NEURONTIN) 100 MG capsule  Take 1 capsule by mouth 3 times daily for 180 days. Intended supply: 30 days.             naproxen (NAPROSYN) 500 MG tablet  Take 1 tablet by mouth 2 times daily as needed for Pain             oxyCODONE (OXY-IR) 10 MG immediate release tablet  Take 1 tablet by mouth every 6 hours as needed for Pain for up to 5 days. Take 1/2 tablet for moderate pain, 1 full tab for severe pain.             sulfamethoxazole-trimethoprim (BACTRIM DS;SEPTRA DS) 800-160 MG per tablet  Take 2 tablets by mouth two times daily x 7 days                 DIET: carb controlled    ACTIVITY: resume regular activity    SIGNIFICANT DIAGNOSTIC STUDIES:  None    COMPLEXITY OF FOLLOW UP:   [x]  Moderate Complexity: follow up within 7-14 calendar days ([id number])   []  Severe Complexity: follow up within 7 calendar days ([id number])    FOLLOW UP TESTING, PENDING RESULTS OR REFERRALS AT TRANSITIONAL CARE VISIT:    [x]   Yes Follow up on wound cultures sensitivity to  ensure abx coverage for enterobacter   []    No    DISPOSITION: Home    FACILITY/HOME CARE AGENCY NAME: None    Follow up with Ellene Route, MD scheduled  --Bonnita Nasuti, CNP    Notification (telephone encounter) to PCP initiated:    [x]   Yes    []   No    INSTRUCTIONS TO MA/SW: Please call patient on day after discharge (must document patient  contacted within 2 business days of discharge).    FOLLOW UP QUESTIONS FOR MA/SW:  1. Did you get medications filled and taking them as instructed from discharge?  2. Are you following your discharge instructions from your hospital stay?  3. Please confirm patient is scheduled for a follow up appointment within the above time frame.    DISCHARGE TIME: > 30 minutes    Electronically signed by Robley Fries, PA-C on 10/08/17 at 12:42 PM

## 2017-10-08 NOTE — Other (Unsigned)
Patient Acct Nbr: [id number]   Primary AUTH/CERT:   Primary Insurance Company Name: [employer]  Primary [id number] name: [facility]  Primary Insurance Group Number: [facility]  Primary Insurance Plan Type: Health  Primary Insurance Policy Number: [id number]

## 2017-10-08 NOTE — Telephone Encounter (Signed)
INSTRUCTIONS TO MA/LPN: Please call patient on day after discharge (must document patient  contacted within 2 business days of discharge).    FOLLOW UP QUESTIONS FOR MA/LPN:  1. Did you get medications filled and taking them as instructed from discharge?  2. Are you following your discharge instructions from your hospital stay?  3. Please confirm patient is scheduled for a follow up appointment within the above time frame.

## 2017-10-08 NOTE — Care Coordination-Inpatient (Signed)
Patient is discharged today. Physician reports patient will not need to be seen at wound care center, patient given supplies to change wound dressings. I did [facility] patient's [facility] Truitt MerleGlenn Huth in Pharmacy did give approval for coverage of these meds.   I did discuss substance abuse treatment with the patient in room. Patient very much downplayed his use. Stated he only took "what I needed to get my pain under control, it was never to get high." Patient stated the meds he has been given here (Baclofen and Gabapentin) have controlled his pain from his Neuropathy well. Patient refuses any need for formal treatment. He is aware however of resources available to him. He will be following up with his Engineer, drillingrobation Officer as well. He will be staying with his ex girlfriend upon D/C.  Electronically signed by Laurette SchimkeAndrea Rashmi Tallent, LSW on 10/08/17 at 4:22 PM

## 2017-10-08 NOTE — Progress Notes (Signed)
[name] Progress Note        Mason KavaBobby R Nettle  DOB:  09/28/1965(52 y.o.)  MRN:  [id number]    Date: 10/08/17   Subjective:    HPI  The patient complains of multiple abscesses. Patient continues to improve. Abscess pain improving, neuropathic pain well controlled on baclofen and gabapentin. Denies any n/v, f/c, CP, SOB. Tolerating abx well. Hoping for d/c today on PO Bactrim.     Scheduled Meds:  . gabapentin  100 mg Oral TID   . mupirocin   Topical BID   . vancomycin  1,750 mg Intravenous Q12H   . traZODone  100 mg Oral Nightly   . insulin glargine  10 Units Subcutaneous Nightly   . baclofen  10 mg Oral TID   . sodium chloride flush  10 mL Intravenous 2 times per day   . enoxaparin  40 mg Subcutaneous Daily   . nicotine  1 patch Transdermal Daily   . acetaminophen  1,000 mg Oral 3 times per day   . insulin lispro  0-6 Units Subcutaneous TID WC   . insulin lispro  0-3 Units Subcutaneous Nightly   . docusate sodium  100 mg Oral Daily   . lidocaine  5 mL Intradermal Once     Continuous Infusions:  . dextrose       PRN Meds:HYDROmorphone, tiZANidine, sodium chloride flush, ondansetron, polyethylene glycol, glucose, dextrose, glucagon (rDNA), dextrose, oxyCODONE, oxyCODONE    Review of Systems   Respiratory: Negative for shortness of breath.    Skin: Positive for color change and wound.   Neurological:        Neuropathy    Psychiatric/Behavioral: The patient is not nervous/anxious.        Interval Pertinent History:  Social History   Substance Use Topics   . Smoking status: Current Every Day Smoker     Packs/day: 1.00     Years: 30.00   . Smokeless tobacco: Never Used      Comment: nicotene patch requested   . Alcohol use No      Comment: 6 pack per yr now; former 6-12 pk per day for 10 yrs       Objective:     Patient Vitals for the past 24 hrs:   BP Temp Temp src Pulse Resp SpO2   10/08/17 0809 (!) 97/58 - Temporal 52 - -   10/08/17 0724 (!) 90/58 97.5 F (36.4 C) Temporal 50 12 95 %   10/08/17 0324 (!)  103/56 97.3 F (36.3 C) Temporal (!) 48 12 97 %   10/07/17 2308 (!) 97/58 97.6 F (36.4 C) Temporal 56 12 96 %   10/07/17 1843 (!) 105/59 97.8 F (36.6 C) Temporal 55 14 97 %       Average, Min, and Max for last 24 hours Vitals:  TEMPERATURE:  Temp  Avg: 97.6 F (36.4 C)  Min: 97.3 F (36.3 C)  Max: 97.8 F (36.6 C)  RESPIRATIONS RANGE: Resp  Avg: 12.5  Min: 12  Max: 14  PULSE RANGE: Pulse  Avg: 52.2  Min: 48  Max: 56  BLOOD PRESSURE RANGE:  Systolic (24hrs), Avg:98 , Min:90 , Max:105   ; Diastolic (24hrs), Avg:58, Min:56, Max:59  PULSE OXIMETRY RANGE: SpO2  Avg: 96.3 %  Min: 95 %  Max: 97 %    No intake/output data recorded.    Physical Exam   Constitutional: He is oriented to person, place, and time. He appears well-developed and well-nourished.  Pt resting in bed, NAD   HENT:   Head: Normocephalic and atraumatic.   Right Ear: External ear normal.   Left Ear: External ear normal.   Nose: Nose normal.   Poor dentition   Eyes: Pupils are equal, round, and reactive to light. Conjunctivae are normal. No scleral icterus.   Neck: Neck supple.   Cardiovascular: Normal rate, regular rhythm, normal heart sounds and intact distal pulses.    No murmur heard.  Radial and DP pulses 2+   Pulmonary/Chest: Effort normal and breath sounds normal. He has no wheezes.   Abdominal: Soft. Bowel sounds are normal. He exhibits no distension.   Musculoskeletal: He exhibits no edema (in lower extremities b/l).   Neurological: He is alert and oriented to person, place, and time.   Skin:   Multiple areas with dressings left arm, right arm, scalp, right and left lower leg. Not examined. Recent dressing changes per RN, left forearm and right elbow sites still draining, most other sites scabbed over   Psychiatric: He has a normal mood and affect. His behavior is normal.   Vitals reviewed.      Lab Results   Component Value Date    WBC 5.3 10/08/2017    HGB 12.4 (L) 10/08/2017    HCT 37.5 (L) 10/08/2017    MCV 83.1 10/08/2017    PLT 288  10/08/2017     Lab Results   Component Value Date    NA 137 10/08/2017    K 5.9 10/08/2017    CL 104 10/08/2017    CO2 31 10/08/2017    BUN 16 10/08/2017    CREATININE 0.56 10/08/2017    GLUCOSE 237 10/08/2017    CALCIUM 8.7 10/08/2017        Lab Results   Component Value Date    LABA1C 7.2 (H) 10/06/2017      Additional results of the last 24 hours have been reviewed.    Assessment and Plan:         Active Problems:    Hepatitis C, Ab + no rna testing    IVDU (intravenous drug user)    Type 2 diabetes mellitus with diabetic neuropathy (HCC)    Severe opioid use disorder (HCC)    MRSA (methicillin resistant Staphylococcus aureus) infection    Polysubstance abuse (HCC)    Cellulitis of left upper extremity    Abscess  Resolved Problems:    * No resolved hospital problems. *      1. Cellulitis and abscess of left arm  -multiple sites of skin wounds/abscesses - blood cultures negative  -s/p I&D of multiple abscesses 2/19. Dressing changes per RN, will give pt supplies to go home, feels he is able to do this on his own  -continue vanc, cefipime- d/c'd 2/20, will send on PO bactrim as cultures with MRSA    2. Polysubstance abuse   -IV heroin user, admits to marijuana and remote amphetamine usage  -ADM recs reviewed, pt with poor social situation and compliance, appreciate help from SW/CM regarding d/c plans, need for ride coordination and insurance coverage. Minimize use of IV dilaudid    3. Chronic Hep C infection  -not treated due to poor follow up compliance and continuing IVDA   -LFTs WNL    4. DM2 with neuropathy    -A1C 7.2, not on any home meds-will benefit from metformin, pt endorses poor diet  -BS in 200s-300s  -continue SSI and ACHS BS checks--Lantus 10U while in house  5. Pain  -much better controlled  -oxycodone10mg  prn, IV dilaudid PRN and completed scheduled toradol.   -Baclofen added by ADM, also on gabapentin for neuropathy--feels this is well controlled- as neuropathic pain is one of the main  impetuses for using heroin, pt will benefit from compliance with these as outpatient.     DVTProphylaxis: lovenox 40 q 24hr - creatinine clearance >30  Disposition:await test results, await consultant recommendations and await clinical improvement  I spent over 51% of total time providing counseling or incoordination of care: > NA   patient and family updated, I personally examined the patient and I personally reviewed chart, data, labs radiology reports    6AM-6PM please page: [phone], Hulen Luster, PA-C  Electronically signed by Robley Fries, PA-C on 10/08/17 at 9:27 AM    6PM-6AM please page:  [facility] Internal Medicine

## 2017-10-08 NOTE — Care Coordination-Inpatient (Signed)
Pt is being discharged to home today.  SW trying to assist with meds.  RN taught pt how to do dressing changes.  Medicaid pending.Electronically signed by Loran SentersSusan Healey-Fast, RN on 10/08/2017 at 12:36 PM

## 2017-10-09 LAB — CULTURE, AEROBIC BACTERIA WITH GRAM STAIN

## 2017-10-09 NOTE — Telephone Encounter (Signed)
Pt hp no longer works, Medical [facility] [employer] number is busy.

## 2017-10-10 LAB — CULTURE BLOOD #1: Blood Culture, Routine: NO GROWTH

## 2017-10-11 LAB — CULTURE, ANAEROBIC
Anaerobic Culture: NO GROWTH
Anaerobic Culture: NO GROWTH

## 2017-10-12 NOTE — Telephone Encounter (Signed)
Mom hp still busy

## 2017-10-14 NOTE — Telephone Encounter (Signed)
Unable to contact pt due to not having a good number in chart.

## 2017-10-16 ENCOUNTER — Ambulatory Visit: Admit: 2017-10-16 | Discharge: 2017-10-16 | Payer: MEDICAID | Attending: Family | Primary: Internal Medicine

## 2017-10-16 DIAGNOSIS — A4902 Methicillin resistant Staphylococcus aureus infection, unspecified site: Secondary | ICD-10-CM

## 2017-10-16 MED ORDER — GABAPENTIN 100 MG PO CAPS
100 | ORAL_CAPSULE | Freq: Three times a day (TID) | ORAL | 2 refills | Status: DC
Start: 2017-10-16 — End: 2017-12-18

## 2017-10-16 MED ORDER — BACLOFEN 10 MG PO TABS
10 | ORAL_TABLET | Freq: Three times a day (TID) | ORAL | 2 refills | Status: DC
Start: 2017-10-16 — End: 2017-12-18

## 2017-10-16 MED ORDER — METFORMIN HCL 500 MG PO TABS
500 MG | ORAL_TABLET | Freq: Two times a day (BID) | ORAL | 3 refills | Status: DC
Start: 2017-10-16 — End: 2017-12-18

## 2017-10-16 MED ORDER — SULFAMETHOXAZOLE-TRIMETHOPRIM 800-160 MG PO TABS
800-160 | ORAL_TABLET | ORAL | 0 refills | Status: DC
Start: 2017-10-16 — End: 2017-12-18

## 2017-10-16 NOTE — Progress Notes (Signed)
Subjective:     Patient: Mason Garza is a 52 y.o. male     Post-hospital  Pt states that he still has abx left. States that the has a couple days left.   Pt states he is concerned about one area on his right leg. Pt states that he thinks it is just as red as when he went to the ER. Pt states that other areas on his arms that were I&Dd seem to all be healing.     Pt states that baclofen and gabapentin is helping with his neuropathy. Pt states that he also has a few percocet left fro the hospital and is concerned that his neuropathy will be worse when he is out of them. States that he has only taken when pain is severe. States that neuropathy is a large reason why he uses. States that a little "dope" takes the pain right away. Pt states that he had tried regimens with PCP in the past and was put on lyrica and sx were controlled. pt states that he "went away" and now does not have insurance which is why he is no longer on lyrica. Pt denies any IV drug use since he has been discharged.    Pt requests metformin. States that sugars were high in the hospital. He was not restarted d/t compliance but he would like a rx today.   Lab Results       Component                Value               Date                       LABA1C                   7.2 (H)             10/06/2017            Lab Results       Component                Value               Date                       EAG                      160                 10/06/2017                   ADMIT DATE:  10/05/2017                  DISCHARGE DATE:  10/08/2017  ??PHONE CONTACT: Mason Garza attempted x 3 without success  FACE-TO-FACE: Mason Garza    PRIMARY CARE PHYSICIAN:  Mason Garza  ??  VISIT STATUS: Admission  ??  CODE STATUS:  Full Code  ??  DISCHARGE DIAGNOSES:    Active Problems:    Hepatitis C, Ab + no rna testing    IVDU (intravenous drug user)    Type 2 diabetes mellitus with diabetic neuropathy (HCC)    Severe opioid use disorder (HCC)    MRSA  (methicillin resistant Staphylococcus aureus) infection    Polysubstance abuse (HCC)    Cellulitis of left upper extremity  Abscess  Resolved Problems:    * No resolved hospital problems. *  ??  ??  HOSPITAL COURSE:  Patient is a 11 y/oM with PMHx of polysubstance drug abuse, DMII with neuropathy, chronic hep C, and MRSA cellulitis/abscess who presents with complaints of multiple skin abscesses on arms, legs, and scalp. Patient admits to noticing them for several weeks and expressing some purulent drainage, but they have subsequently scabbed over. He endorses pain and tenderness to a fluctuant left forearm abscess that was at a site of IV injection. Last heroin usage on Saturday. He endorses subjective fevers and chills at home for the past few days. He denies any n/v, CP, SOB, cough, abdominal pain, or dysuria. He also admits to tobacco and marijuana use as well as amphetamines. He denies any alcohol use. He lives at home with his brother and is not currently working. ??  ??  Of note, he was admitted for a right lower leg abscess in 05/2017 and treated with bedside I&D as well as IV vanc which was switched to keflex and bactrim was added as he was MRSA positive. He is noncompliant with follow up appointments and states he hasn't taken any prescribed medications in awhile. In the ED VSS, lab work up was unremarkable with normal WBC. BCx were drawn and patient was given a dose of vancomycin and admitted for further management. He was treated with IV vanc and cefipime.   ??  General surgery was consulted and took patient to OR on 2/19 for I&D of multiple sites (left arm, right wrist and elbow, scalp, right knee, left lateral lower leg).  Pain was treated with schedule IV toradol, IV dilaudid PRN, and PO oxycodone PRN. Baclofen and gabapentin were effective in controlling his neuropathic pain which he states is one of the main reasons why he uses heroin. ADM evaluated patient and recommended inpatient drug rehab due to  his polysubstance abuse but in his current state and lack of insurance this was not an option even if he did agree to go to treatement. Patient was hyperglycemic likely 2/2 steroids and surgery, and was treated with SSI and nightly lantus with adequate control. We discussed the importance of diabetes control to help with neuropathy, but patient states he can't afford any medications and with history of compliance, patient wasn't started on metformin on discharge.     BCx remained negative and wound cultures grew MRSA, strep pyogenes, and enterobacter. Sensitivities were still pending for enterobacter at discharge but Abx stewardship said Bactrim should cover. He was educated on dressing changes and wound care prior to discharge. SW was active in helping patient afford medications. He was discharged with Bactrim DS two tabs BID and amoxicillin BID for one week. He was also sent with prescriptions for baclofen, gabapentin, and oxycodone (20 tabs) for pain control.  Pt has follow up on 10/16/17.  ??  PROCEDURES:  I&D in OR on 2/19  ??  CONSULTANTS:  ADM- Mason Garza   General Surgery- Mason Garza   ??  DISCHARGE MEDICATIONS:      ??  Significant Medication Changes: gabapentin, baclofen, oxycodone, bactrim and amoxicillin x 7d  ??              ?? Mason Garza   Home Medication Instructions KGM:[id number]   ?? Printed on:10/08/17 1242   Medication Information ?? ?? ?? ?? ?? ?? ?? ??   ?? ?? ?? ?? ?? ?? ?? ?? ??   amoxicillin (AMOXIL) 500 MG capsule  Take 1 capsule by mouth 2 times daily for 7 days  ?? ?? ?? ?? ?? ?? ?? ?? ??   baclofen (LIORESAL) 10 MG tablet  Take 1 tablet by mouth 3 times daily  ?? ?? ?? ?? ?? ?? ?? ?? ??   gabapentin (NEURONTIN) 100 MG capsule  Take 1 capsule by mouth 3 times daily for 180 days. Intended supply: 30 days.  ?? ?? ?? ?? ?? ?? ?? ?? ??   naproxen (NAPROSYN) 500 MG tablet  Take 1 tablet by mouth 2 times daily as needed for Pain  ?? ?? ?? ?? ?? ?? ?? ?? ??   oxyCODONE (OXY-IR) 10 MG immediate release tablet  Take 1 tablet by mouth every 6  hours as needed for Pain for up to 5 days. Take 1/2 tablet for moderate pain, 1 full tab for severe pain.  ?? ?? ?? ?? ?? ?? ?? ?? ??   sulfamethoxazole-trimethoprim (BACTRIM DS;SEPTRA DS) 800-160 MG per tablet  Take 2 tablets by mouth two times daily x 7 days  ?? ?? ?? ?? ?? ?? ?? ?? ??   ??  ??  DIET: carb controlled  ??  ACTIVITY: resume regular activity  ??  SIGNIFICANT DIAGNOSTIC STUDIES:  None  ??  COMPLEXITY OF FOLLOW UP:   [x]  Moderate Complexity: follow up within 7-14 calendar days (04540)   []  Severe Complexity: follow up within 7 calendar days (98119)  ??  FOLLOW UP TESTING, PENDING RESULTS OR REFERRALS AT TRANSITIONAL CARE VISIT:    [x]   Yes Follow up on wound cultures sensitivity to ensure abx coverage for enterobacter   []   No  ??  DISPOSITION: Home  ??  FACILITY/HOME CARE AGENCY NAME: None    Review of Systems   Constitutional: Negative for activity change, chills and fever.   Respiratory: Negative for cough and shortness of breath.    Cardiovascular: Negative for chest pain and leg swelling.   Gastrointestinal: Negative for abdominal pain, diarrhea, nausea and vomiting.   Musculoskeletal: Positive for myalgias (controlled with baclofen).   Skin: Positive for color change and wound.   Neurological: Positive for numbness (controlled with baclofen and gabapentin). Negative for dizziness.        No Known Allergies  Current Outpatient Prescriptions on File Prior to Visit   Medication Sig Dispense Refill   ??? naproxen (NAPROSYN) 500 MG tablet Take 1 tablet by mouth 2 times daily as needed for Pain 60 tablet 3     No current facility-administered medications on file prior to visit.       Past Medical History:   Diagnosis Date   ??? Chronic hepatitis C (HCC) 11/22/2015    active IVDU   ??? H/O echocardiogram 05/12/11    EF62%, mild LAE   ??? Neuropathy    ??? 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 Psychiatric problem    ??? Type II or unspecified type diabetes mellitus without mention of complication, not stated as  uncontrolled       Past Surgical History:   Procedure Laterality Date   ??? OTHER SURGICAL HISTORY Bilateral 10/06/2017    Irrigation and Debridement of Abscesses arms and scalp     No family history on file.  Social History   Substance Use Topics   ??? Smoking status: Current Every Day Smoker     Packs/day: 1.00     Years: 30.00   ??? Smokeless tobacco: Never Used  Comment: nicotene patch requested   ??? Alcohol use No      Comment: 6 pack per yr now; former 6-12 pk per day for 10 yrs          Objective:     BP 131/85 (Site: Right Upper Arm, Position: Sitting, Cuff Size: Medium Adult)    Pulse 102    Wt 176 lb 11.2 oz (80.2 kg)    SpO2 97%    BMI 24.64 kg/m??     Physical Exam   Constitutional: He appears well-developed and well-nourished. No distress.   HENT:   Head: Normocephalic and atraumatic.   Right Ear: External ear normal.   Left Ear: External ear normal.   Eyes: Conjunctivae are normal. Right eye exhibits no discharge.   Neck: Neck supple.   Cardiovascular: Normal rate and regular rhythm.    Pulmonary/Chest: Effort normal and breath sounds normal.   Abdominal: Soft. Bowel sounds are normal. There is no tenderness.   Lymphadenopathy:     He has no cervical adenopathy.   Neurological: He is alert.   Skin: Skin is warm and dry. He is not diaphoretic.   Multiple scabbed I&D lesions on arms . 2 lesions on legs with surrounding redness that pt states has not improved with abx. See images.    Psychiatric: His behavior is normal.   Disheveled, unkempt               Assessment      1. MRSA (methicillin resistant Staphylococcus aureus) infection    2. Type 2 diabetes mellitus with diabetic neuropathy, without long-term current use of insulin (HCC)    3. Hospital discharge follow-up    4. Neuropathy         Plan      1. MRSA (methicillin resistant Staphylococcus aureus) infection  - sulfamethoxazole-trimethoprim (BACTRIM DS;SEPTRA DS) 800-160 MG per tablet; Take 2 tablets by mouth two times daily x 7 days  Dispense: 28  tablet; Refill: 0    2. Type 2 diabetes mellitus with diabetic neuropathy, without long-term current use of insulin (HCC)  - metFORMIN (GLUCOPHAGE) 500 MG tablet; Take 1 tablet by mouth 2 times daily (with meals)  Dispense: 60 tablet; Refill: 3    3. Hospital discharge follow-up    4. Neuropathy  - baclofen (LIORESAL) 10 MG tablet; Take 1 tablet by mouth 3 times daily  Dispense: 90 tablet; Refill: 2  - gabapentin (NEURONTIN) 100 MG capsule; Take 1 capsule by mouth 3 times daily for 90 days. Intended supply: 30 days.  Dispense: 90 capsule; Refill: 2    Follow up with PCP as discussed in 3 months. Metformin as prescribed. Pt does not have insurance at this point. Neuropathy was controlled in the past on lyrica. He is doing well on the current dose of gabapentin and baclofen. He requested increased does of gabapentin as he is concerned that neuropathy will be worse when he is out of percocet. Pt states that he has not been taking it scheduled, only PRN.   Return if sx worsen or new concerns  Mason Nasuti, APRN - Garza  10/16/17  2:21 PM      Health Maintenance Due   Topic Date Due   ??? Diabetic retinal exam  07/24/1976   ??? Shingles Vaccine (1 of 2 - 2 Dose Series) 07/24/2016   ??? Colon cancer screen colonoscopy  07/24/2016   ??? Diabetic foot exam  09/27/2016   ??? Diabetic microalbuminuria test  09/27/2016   ???  Lipid screen  11/16/2016       I spent over 51% of total time **counseling and providing discussion regarding   1. MRSA (methicillin resistant Staphylococcus aureus) infection    2. Type 2 diabetes mellitus with diabetic neuropathy, without long-term current use of insulin (HCC)    3. Hospital discharge follow-up    4. Neuropathy     diagnosis, treatment and follow-up as noted above.    Dictated using Dragon Naturally SpeakingMedical Version 2.4  Proof read however unrecognized voice recognition errors may have occurred

## 2017-12-11 NOTE — Telephone Encounter (Signed)
Tired to call Cristal DeerChristopher. Phone just cont 'ed to ring. Pt needs an appt with Dr. [name] to discuss all theses issues. Letter sent since message stated the pt does not have a phone.

## 2017-12-11 NOTE — Telephone Encounter (Signed)
Name of Caller: Mason Garza    Contact phone number: [phone]    Relationship to Patient: patient    Provider: Dr Olivia Mackie    Practice:  [facility]    Chief Complaint/Reason for Call: Pt states that he is to get his teeth pulled but needs to have his sugar checked to go through with this. Pt is requesting a meter, test strips & lancets. Pt would like that rx to go to a pharmacy that can deliver meds to him. Pt was not sure where that would be. Pt states that if any questions, please call his case manager [name] @ the above #    Pt did not get any meds for his tooth pain because they did not know what would be best for pt. Pt is on an additional antibiotic, cephalexin 500 mg 30 tabs x 10 days, from the dentist. What would the Dr recommend for the dentist to prescribe for pain? Naproxen?    Pt ok to get all of his teeth pulled next week due to pt having a staph infection about a month ago? The dentist that the pt is seeing is at [facility] [phone]. Pt is requesting to coordinate w/ the dentist regarding the above as the pt does not have a phone. Dentist ofc is closed today, usually open on Friday    Pt requesting something to help stop smoking & the patch that the Dr prescribed previously did not work. Pt cannot have gum where he is living now. With this pt would like to have rx to be sent to a pharmacy that will deliver.    Pt has very low energy & tires quickly. Dr stated to pt that immune system was low. Pt is requesting protein of some kind. Meds that pt has to take @ facility he is at upsets his stomach as meds are sometimes taken not at meal times    Pt's address is [address] [place], [place] [address]    Best time of day caller can be reached: Any       Patient advised that office/PCP has 24-48 business hours to return their call: Yes

## 2017-12-14 NOTE — Telephone Encounter (Signed)
Name of Caller: Bobbdy    Contact phone number: verified above    Relationship to Patient: patient    Provider: Olivia Mackie    Practice:  [facility]    Chief Complaint/Reason for Call: Pt returning call.  Phone number corrected.  Rescheduled for 5/3 at 10:50 to discuss medications also, he was previously scheduled for 01/06/18    Best time of day caller can be reached: Any       Patient advised that office/PCP has 24-48 business hours to return their call: Yes

## 2017-12-18 ENCOUNTER — Encounter

## 2017-12-18 ENCOUNTER — Ambulatory Visit: Admit: 2017-12-18 | Discharge: 2017-12-18 | Payer: MEDICAID | Attending: Internal Medicine | Primary: Internal Medicine

## 2017-12-18 DIAGNOSIS — E1140 Type 2 diabetes mellitus with diabetic neuropathy, unspecified: Secondary | ICD-10-CM

## 2017-12-18 LAB — POCT GLYCOSYLATED HEMOGLOBIN (HGB A1C): Hemoglobin A1C: 9 %

## 2017-12-18 LAB — MICROALBUMIN / CREATININE URINE RATIO
Creatinine, Random Urine: 57.3 mg/dL
Microalb, Ur: <6 mg/L (ref 0.0–17.0)

## 2017-12-18 MED ORDER — VARENICLINE TARTRATE 0.5 MG PO TABS
0.5 MG | ORAL_TABLET | ORAL | 0 refills | Status: DC
Start: 2017-12-18 — End: 2018-02-11

## 2017-12-18 MED ORDER — SITAGLIPTIN PHOSPHATE 50 MG PO TABS
50 MG | ORAL_TABLET | Freq: Every day | ORAL | 1 refills | Status: DC
Start: 2017-12-18 — End: 2017-12-29

## 2017-12-18 MED ORDER — BACLOFEN 10 MG PO TABS
10 MG | ORAL_TABLET | ORAL | 2 refills | Status: DC
Start: 2017-12-18 — End: 2018-02-11

## 2017-12-18 MED ORDER — LANCETS MISC
3 refills | Status: AC
Start: 2017-12-18 — End: ?

## 2017-12-18 MED ORDER — GABAPENTIN 100 MG PO CAPS
100 MG | ORAL_CAPSULE | Freq: Two times a day (BID) | ORAL | 2 refills | Status: DC
Start: 2017-12-18 — End: 2017-12-22

## 2017-12-18 MED ORDER — KETOCONAZOLE 2 % EX CREA
2 | CUTANEOUS | 1 refills | Status: DC
Start: 2017-12-18 — End: 2018-02-11

## 2017-12-18 MED ORDER — METFORMIN HCL ER 500 MG PO TB24
500 MG | ORAL_TABLET | Freq: Two times a day (BID) | ORAL | 5 refills | Status: DC
Start: 2017-12-18 — End: 2018-02-11

## 2017-12-18 MED ORDER — NAPROXEN 500 MG PO TABS
500 MG | ORAL_TABLET | Freq: Two times a day (BID) | ORAL | 3 refills | Status: DC | PRN
Start: 2017-12-18 — End: 2018-02-11

## 2017-12-18 MED ORDER — ALCOHOL PADS 70 % PADS
70 | Freq: Three times a day (TID) | 5 refills | Status: AC
Start: 2017-12-18 — End: ?

## 2017-12-18 MED ORDER — FREESTYLE SYSTEM KIT
PACK | Freq: Every day | 0 refills | Status: AC
Start: 2017-12-18 — End: ?

## 2017-12-18 MED ORDER — LANCETS MISC
Freq: Three times a day (TID) | 11 refills | Status: AC
Start: 2017-12-18 — End: ?

## 2017-12-18 MED ORDER — BLOOD GLUCOSE TEST VI STRP
ORAL_STRIP | 5 refills | Status: AC
Start: 2017-12-18 — End: ?

## 2017-12-18 NOTE — Progress Notes (Signed)
[facility]       Mason Garza  DOB:  1966-04-05 Date: 12/18/17        Subjective:  Mason Garza is a 52 y.o. male who presents with chief complaint of Follow-up (Patient states he is here today because he would like to get something to stop smoking. He would like a meter ordered so he can check his sugars at home and he would like to talk about getting his meedications ajusted. Patient it getting most of his teeth pulled and he talked to them about a medication and they said to check with you and make sure it is okay with the PCP.) and Discuss Medications (Patient states he has no energy and would like to know what to do for it. Patient states he was on lyrica a while back and would like to start on it.)      HPI:  Patient presents for follow up.  Concerns about blood sugars, pain, and medication.  Wants smoking cessation aid.    oriana for about 2.5 weeks.  Feeling a lot better.  Clean now.  4 Took awhile to get diabetic trays.    The office is managing the gabapentin - getting about 2 pills per day now  Needs higher dose  Muscle spasm and twitching is better - taking about one per day now.    Current burning and stinging pain is 8/10.  When was getting it tid was 6/10.  Not eating well.  Does not have meter.  Lab Results   Component Value Date    LABA1C 9.0 12/18/2017     Lab Results   Component Value Date    EAG 160 10/06/2017       Lab Results   Component Value Date    LABA1C 7.2 (H) 10/06/2017     Lab Results   Component Value Date    EAG 160 10/06/2017     Lab Results   Component Value Date    CHOL 95 11/17/2015    CHOL 92 (L) 04/27/2014     Lab Results   Component Value Date    TRIG 370 (A) 11/17/2015    TRIG 114 04/27/2014     Lab Results   Component Value Date    HDL 26 (L) 11/17/2015    HDL 24 (L) 04/27/2014     Lab Results   Component Value Date    LDLCHOLESTEROL -5 (AA) 11/17/2015    LDLCALC 45 04/27/2014     No results found for: LABVLDL,  VLDL  Lab Results   Component Value Date    CHOLHDLRATIO 4 11/17/2015     Lab Results   Component Value Date    WBC 5.3 10/08/2017    HGB 12.4 (L) 10/08/2017    HCT 37.5 (L) 10/08/2017    MCV 83.1 10/08/2017    PLT 288 10/08/2017     Lab Results   Component Value Date    NA 137 10/08/2017    K 5.9 (H) 10/08/2017    CL 104 10/08/2017    CO2 31 (H) 10/08/2017    BUN 16 10/08/2017    CREATININE 0.56 10/08/2017    GLUCOSE 237 (H) 10/08/2017    CALCIUM 8.7 10/08/2017    PROT 7.5 10/05/2017    LABALBU 3.8 10/05/2017    BILITOT 0.5 10/05/2017    ALKPHOS 118 10/05/2017    AST 26 10/05/2017    ALT 29 10/05/2017  LABGLOM >60 04/27/2014    GFRAA >60 04/27/2014    AGRATIO 1.0 04/27/2014    GLOB 3.4 04/27/2014     Reports will probably need dental extraction soon.      Hx hep c - no previous complications.  Not evaluate for treatment previously due to active IVDU.     Current Meds:   Current Outpatient Medications   Medication Sig Dispense Refill   ??? cephALEXin (KEFLEX) 500 MG capsule  Last dose today Take 500 mg by mouth 3 times daily  0   ??? metFORMIN (GLUCOPHAGE) 500 MG tablet Take 1 tablet by mouth 2 times daily (with meals) 60 tablet 3   ??? baclofen (LIORESAL) 10 MG tablet  Taking about once a day currently Take 1 tablet by mouth 3 times daily 90 tablet 2   ??? gabapentin (NEURONTIN) 100 MG capsule  Taking twice daily because cant get to the dispensary three times a day - pain not adequately controlled.  Previously on lyrica due to ineffective control on gabapentin Take 1 capsule by mouth 3 times daily for 90 days. Intended supply: 30 days. 90 capsule 2   ??? sulfamethoxazole-trimethoprim (BACTRIM DS;SEPTRA DS) 800-160 MG per tablet  On last day of therapy Take 2 tablets by mouth two times daily x 7 days 28 tablet 0   ??? naproxen (NAPROSYN) 500 MG tablet  Would like refill Take 1 tablet by mouth 2 times daily as needed for Pain 60 tablet 3     No current facility-administered medications for this visit.        Past Medical  History:   Diagnosis Date   ??? Chronic hepatitis C ([place]) 11/22/2015    active IVDU   ??? H/O echocardiogram 05/12/11    EF62%, mild LAE   ??? Neuropathy    ??? 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 Psychiatric problem    ??? Type II or unspecified type diabetes mellitus without mention of complication, not stated as uncontrolled           Social History     Socioeconomic History   ??? Marital status: Divorced     Spouse name: None   ??? Number of children: None   ??? Years of education: None   ??? Highest education level: None   Occupational History   ??? None   Social Needs   ??? Financial resource strain: None   ??? Food insecurity:     Worry: None     Inability: None   ??? Transportation needs:     Medical: None     Non-medical: None   Tobacco Use   ??? Smoking status: Current Every Day Smoker     Packs/day: 1.00     Years: 30.00     Pack years: 30.00     Types: Cigarettes   ??? Smokeless tobacco: Never Used   ??? Tobacco comment: nicotene patch requested   Substance and Sexual Activity   ??? Alcohol use: No     Alcohol/week: 0.0 oz     Comment: 6 pack per yr now; former 6-12 pk per day for 10 yrs   ??? Drug use: Yes     Frequency: 10.0 times per week     Types: Opiates , IV, Methamphetamines     Comment: heroin IVDU daily   ??? Sexual activity: Yes     Partners: Female   Lifestyle   ??? Physical activity:  Days per week: None     Minutes per session: None   ??? Stress: None   Relationships   ??? Social connections:     Talks on phone: None     Gets together: None     Attends religious service: None     Active member of club or organization: None     Attends meetings of clubs or organizations: None     Relationship status: None   ??? Intimate partner violence:     Fear of current or ex partner: None     Emotionally abused: None     Physically abused: None     Forced sexual activity: None   Other Topics Concern   ??? None   Social History Narrative    In [place] 12/2017    Looking for a place to live       No Known  Allergies    ROS:  Review of Systems   Constitutional: Negative for activity change, appetite change, chills, fever and unexpected weight change.   HENT: Positive for dental problem.    Eyes: Negative.    Respiratory: Negative.    Cardiovascular: Negative.    Gastrointestinal: Negative.    Genitourinary: Negative.    Musculoskeletal: Negative for myalgias.   Skin: Negative for rash and wound.        healed   Neurological: Positive for numbness. Negative for dizziness, syncope and weakness.   Hematological: Negative.        Objective:  Vitals:    12/18/17 1110   BP: 120/70   Site: Right Upper Arm   Position: Sitting   Cuff Size: Medium Adult   Pulse: 77   SpO2: 96%   Weight: 175 lb 8 oz (79.6 kg)     Physical Exam   Constitutional: He is oriented to person, place, and time. He appears well-developed. No distress.   HENT:   Head: Normocephalic and atraumatic.   Mouth/Throat: Oropharynx is clear and moist.   Poor dentition   Eyes: Pupils are equal, round, and reactive to light. Conjunctivae and EOM are normal.   Neck: Neck supple. No thyromegaly present.   Cardiovascular: Normal rate, regular rhythm, normal heart sounds and intact distal pulses. Exam reveals no friction rub.   No murmur heard.  Pulmonary/Chest: Effort normal and breath sounds normal. He has no wheezes. He has no rales.   Abdominal: Soft. Bowel sounds are normal. There is no tenderness.   Musculoskeletal: He exhibits no edema or tenderness.   No active infection; benign tattoos   Lymphadenopathy:     He has no cervical adenopathy.   Neurological: He is alert and oriented to person, place, and time. No cranial nerve deficit.   Skin: Skin is warm and dry. Capillary refill takes less than 2 seconds. No rash noted. He is not diaphoretic.   Diabetic foot exam: pulses intact, nail changes, tinea pedis, + abnormal monofilament, + callus with cracking    Psychiatric: He has a normal mood and affect. His behavior is normal.       Assessment:   Diagnosis Orders    1. Type 2 diabetes mellitus with diabetic neuropathy, without long-term current use of insulin (HCC)  POCT glycosylated hemoglobin (Hb A1C)    Microalbumin / Creatinine Urine Ratio    metFORMIN (GLUCOPHAGE-XR) 500 MG extended release tablet    SITagliptin (JANUVIA) 50 MG tablet    glucose monitoring kit (FREESTYLE) monitoring kit    blood glucose monitor strips  Lancets MISC    Lancets MISC    Alcohol Swabs (ALCOHOL PADS) 70 % PADS   2. Neuropathy  baclofen (LIORESAL) 10 MG tablet    gabapentin (NEURONTIN) 100 MG capsule    Vitamin B12    TSH without Reflex   3. Screen for colon cancer  Fecal Blood Immunochemical Test   4. Poor dentition  naproxen (NAPROSYN) 500 MG tablet   5. Tinea pedis of both feet  ketoconazole (NIZORAL) 2 % cream   6. Polysubstance abuse ([place])     7. Chronic hepatitis C without hepatic coma (HCC)  Hepatitis B Surface Antibody    Hepatitis A Antibody, Total   8. Not immune to hepatitis B virus  Hepatitis B Surface Antibody    Hepatitis A Antibody, Total   9. Other fatigue  Vitamin B12    Vitamin D 25 Hydroxy    TSH without Reflex   10. Tobacco use  varenicline (CHANTIX) 0.5 MG tablet   11. Laboratory exam ordered as part of routine general medical examination  Comprehensive Metabolic Panel    CBC Auto Differential   12. Screening for lipid disorders  Lipid Panel       Plan:   1. Chronic problem, uncontrolled - worsened.  Now getting diabetic meal tray.  Encouraged to schedule eye exam.  Sees [facility].  Urine & dm foot exam today.  Reports some GI upset when takes metformin on empty stomach - will try to change to ER and titrate to max dosing. Will add additional oral therapy - patient to notify if not able to fill.  DM supplies ordered.  Referred to [facility] classes.  Following with podiatry - DM shoe form completed today  2. Chronic problem , uncontrolled.  Patient unable to reliably take gabapentin tid due to facility restrictions. Will adjust to 340m bid and titrate further.   Instructed to complete the 1067mprescription before we will fill at higher dose - letter written with instructions for facility.  3. Declines colonoscopy at this time.  Agrees to fit.  4. Chronic problem, unclear state - reports will likely need dental extractions.   5. New problem, start topical therapy.    6. Chronic problem, continued cessation efforts encouraged.    7. Chronic problem, appears stable.  Not candidate for treatment yet due to recent substance abuse.  May need additional immunization at fu appt.  8. Chronic problem, check titer levels  9. Chronic problem, check labs.  Likely multifactorial  10. Chronic problem, patient would like to trial chantix.  rx written  11. See orders  12. See orders - likely would benefit from statin therapy    The ASCVD Risk score (GMikey BussingC Jr., et al., 2013) failed to calculate for the following reasons:    The valid total cholesterol range is 130 to 320 mg/dL    Medication indications, directions,and side effects were discussed.     Health Maintenance Due   Topic Date Due   ??? Diabetic retinal exam  07/24/1976   ??? Hepatitis A vaccine (3 of 3 - Hep A Twinrix risk 3-dose series) 11/22/2014   ??? Shingles Vaccine (1 of 2) 07/24/2016   ??? Colon cancer screen colonoscopy  07/24/2016   ??? Lipid screen  11/16/2016     Immunization History   Administered Date(s) Administered   ??? Hepatitis A 05/19/2014, 06/23/2014   ??? Hepatitis B (Engerix-B) 05/19/2014, 06/23/2014   ??? Hepatitis B (Recombivax HB) 06/26/2015   ??? Influenza Vaccine, unspecified  formulation 05/19/2014   ??? Influenza, Quadv, 3 yrs and older, IM, PF (Fluzone 3 yrs and older or Afluria 5 yrs and older) 06/26/2015   ??? Influenza, Quadv, 6 mo and older, IM, PF (Flulaval, Fluarix) 05/29/2017   ??? Pneumococcal Polysaccharide (Pneumovax23) 05/12/2011   ??? Tdap (Boostrix, Adacel) 05/19/2014       Return in about 3 months (around 03/20/2018) for wellness exam; report blood sugars in 1 week .    Dictated using Dragon Naturally Speaking  Medical Version 2.4  Proof read however unrecognized voicerecognition errors may have occurred.    Electronically signed by Omelia Blackwater, MD on5/3/19 at 11:25 AM

## 2017-12-18 NOTE — Patient Instructions (Addendum)
Patient Education        sitagliptin  Pronunciation:  SI ta glip tin  Brand:  Januvia  What is the most important information I should know about sitagliptin?  Call your doctor if you have symptoms of heart failure --shortness of breath (even while lying down), swelling in your legs or feet, rapid weight gain.  Stop taking sitagliptin and call your doctor if you have symptoms of pancreatitis: severe pain in your upper stomach spreading to your back, with or without vomiting.  What is sitagliptin?  Sitagliptin is an oral diabetes medicine that helps control blood sugar levels. It works by regulating the levels of insulin your body produces after eating.  Sitagliptin is used together with diet and exercise to improve blood sugar control in adults with type 2 diabetes mellitus. Sitagliptin is not for treating type 1 diabetes.  Sitagliptin may also be used for purposes not listed in this medication guide.  What should I discuss with my healthcare provider before taking sitagliptin?  You should not use sitagliptin if you are allergic to it, or if you have diabetic ketoacidosis (call your doctor for treatment with insulin).  Tell your doctor if you have ever had:   kidney disease (or if you are on dialysis);   heart problems;   pancreatitis;   high triglycerides (a type of fat in the blood);   gallstones; or   alcoholism.  Follow your doctor's instructions about using this medicine if you are pregnant. Blood sugar control is very important during pregnancy, and your dose needs may be different during each trimester.  Your name may need to be listed on a sitagliptin pregnancy registry when you start using this medicine.  It may not be safe to breast-feed a baby while you are using this medicine. Ask your doctor about any risks.  Sitagliptin is not approved for use by anyone younger than 52 years old.  How should I take sitagliptin?  Follow all directions on your prescription label and read all medication guides or  instruction sheets. Your doctor may occasionally change your dose. Use the medicine exactly as directed.  You may take this medicine with or without food. Follow your doctor's instructions.  Your blood sugar will need to be checked often, and you may need other blood tests at your doctor's office.  Low blood sugar (hypoglycemia) can happen to everyone who has diabetes. Symptoms include headache, hunger, sweating, irritability, dizziness, nausea, and feeling shaky. To quickly treat low blood sugar, always keep a fast-acting source of sugar with you such as fruit juice, hard candy, crackers, raisins, or non-diet soda.  Your doctor can prescribe a glucagon emergency injection kit to use in case you have severe hypoglycemia and cannot eat or drink. Be sure your family and close friends know how to give you this injection in an emergency.  Also watch for signs of high blood sugar (hyperglycemia) such as increased thirst or urination, blurred vision, headache, and tiredness.  Blood sugar levels can be affected by stress, illness, surgery, exercise, alcohol use, or skipping meals. Ask your doctor before changing your dose or medication schedule.  Sitagliptin is only part of a complete treatment program that may also include diet, exercise, weight control, blood sugar testing, and special medical care. Follow your doctor's instructions very closely.  Store at room temperature away from moisture, heat, and light.  What happens if I miss a dose?  Take the medicine as soon as you can, but skip the missed dose  if it is almost time for your next dose. Do not take two doses at one time.  What happens if I overdose?  Seek emergency medical attention or call the Poison Help line at [phone]. You may have signs of low blood sugar, such as extreme weakness, blurred vision, sweating, trouble speaking, tremors, stomach pain, confusion, and seizure (convulsions).  What should I avoid while taking sitagliptin?  Follow your doctor's  instructions about any restrictions on food, beverages, or activity.  What are the possible side effects of sitagliptin?  Get emergency medical help if you have signs of an allergic reaction (hives, difficult breathing, swelling in your face or throat) or a severe skin reaction (fever, sore throat, burning in your eyes, skin pain, red or purple skin rash that spreads and causes blistering and peeling).  Stop taking sitagliptin and call your doctor right away if you have symptoms of pancreatitis: severe pain in your upper stomach spreading to your back, with or without vomiting.  Call your doctor at once if you have:   severe autoimmune reaction --itching, blisters, breakdown of the outer layer of skin;   severe or ongoing pain in your joints;   little or no urination; or   symptoms of heart failure --shortness of breath (even while lying down), swelling in your legs or feet, rapid weight gain.  Common side effects may include:   low blood sugar;   headache; or   runny or stuffy nose, sore throat.  This is not a complete list of side effects and others may occur. Call your doctor for medical advice about side effects. You may report side effects to FDA at 1-800-FDA-1088.  What other drugs will affect sitagliptin?  Sitagliptin may not work as well when you use other medicines at the same time. Many other drugs can also affect blood sugar control. This includes prescription and over-the-counter medicines, vitamins, and herbal products. Not all possible interactions are listed here. Tell your doctor about all medicines you start or stop using.  Where can I get more information?  Your pharmacist can provide more information about sitagliptin.  Remember, keep this and all other medicines out of the reach of children, never share your medicines with others, and use this medication only for the indication prescribed.  Every effort has been made to ensure that the information provided by [employer] ('[employer]')  is accurate, up-to-date, and complete, but no guarantee is made to that effect. Drug information contained herein may be time sensitive. [employer] information has been compiled for use by healthcare practitioners and consumers in the [place] and therefore [employer] does not warrant that uses outside of the [place] are appropriate, unless specifically indicated otherwise. [employer]'s drug information does not endorse drugs, diagnose patients or recommend therapy. [employer]'s drug information is an [employer], corporate to assist licensed healthcare practitioners in caring for their patients and/or to serve consumers viewing this service as a supplement to, and not a substitute for, the expertise, skill, knowledge and judgment of healthcare practitioners. The absence of a warning for a given drug or drug combination in no way should be construed to indicate that the drug or drug combination is safe, effective or appropriate for any given patient. [employer] does not assume any responsibility for any aspect of healthcare administered with the aid of information [employer] provides. The information contained herein is not intended to cover all possible uses, directions, precautions, warnings, drug interactions, allergic reactions, or adverse effects. If you have questions about  the drugs you are taking, check with your doctor, nurse or pharmacist.  Copyright [phone] [employer] Version: 9.04. Revision date: 10/22/2016.  Care instructions adapted under license by [facility]. If you have questions about a medical condition or this instruction, always ask your healthcare professional. [employer] disclaims any warranty or liability for your use of this information.       Patient Education        varenicline  Pronunciation:  ver EN e Hardin Negus  Brand:  Chantix  What is the most important information I should know about varenicline?  Do not drink large amounts alcohol. Varenicline can increase the  effects of alcohol or change the way you react to it.  What is varenicline?  Varenicline is a smoking cessation medicine. It is used together with behavior modification and counseling support to help you stop smoking.  Varenicline may also be used for purposes not listed in this medication guide.  What should I discuss with my health care provider before taking varenicline?  You should not use varenicline if you used it in the past and had:   a serious allergic reaction --trouble breathing, swelling in your face (lips, tongue, throat) or neck; or   a serious skin reaction --blisters in your mouth, peeling skin rash.  To make sure varenicline is safe for you, tell your doctor if you have:   a history of depression or mental illness;   a history of seizures;   kidney disease (or if you are on dialysis);   heart disease, circulation problems; or   if you drink alcohol.  It is not known whether this medicine will harm an unborn baby. Tell your doctor if you are pregnant or plan to become pregnant.  It is not known whether varenicline passes into breast milk. However, if you breast-feed while using this medicine, your baby may spit up or vomit more than normal, and may have a seizure.  Varenicline is not approved for use by anyone younger than 52 years old.  How should I take varenicline?  Follow all directions on your prescription label. Do not take this medicine in larger or smaller amounts or for longer than recommended.  When you first start taking varenicline, you will take a low dose and then gradually increase it over the first several days of treatment. Follow your doctor's dosing instructions very carefully.  You may choose from 3 ways to use varenicline. Ask your doctor which method is best for you:   Set a date to quit smoking and start taking varenicline 1 week before that date. This will allow the drug to build up in your body. Make sure to quit smoking on your planned quit date. Take varenicline for  a total of 12 weeks.   Start taking varenicline before you set a planned quit date. Once you start taking the medicine, choose a quit date that is between 8 and 35 days after you start treatment. Take varenicline for a total of 12 weeks.   Start taking varenicline and gradually reduce the number of cigarettes you smoke each day over a 12-week period, until you no longer smoke at all. Then take varenicline for another 12 weeks, for a total of 24 weeks.  Take varenicline after eating. Take the medicine with a full glass of water.  Use varenicline regularly to get the most benefit. Get your prescription refilled before you run out of medicine completely. You should remain under the care of a doctor while  taking varenicline.  Read all patient information, medication guides, and instruction sheets provided to you. Ask your doctor or pharmacist if you have any questions.  You may have nicotine withdrawal symptoms when you stop smoking, including: increased appetite, weight gain, trouble sleeping, trouble concentrating, slower heart rate, having the urge to smoke, and feeling anxious, restless, depressed, angry, frustrated, or irritated. These symptoms may occur with or without using medication such as varenicline.   Smoking cessation may also cause new or worsening mental health problems, such as depression.  Store at room temperature away from moisture and heat.  What happens if I miss a dose?  Take the missed dose as soon as you remember. Skip the missed dose if it is almost time for your next scheduled dose. Do not take extra medicine to make up the missed dose.  What happens if I overdose?  Seek emergency medical attention or call the Poison Help line at [phone].  What should I avoid while taking varenicline?  Do not drink large amounts alcohol while taking this medicine.  Varenicline can increase the effects of alcohol or change the way you react to it. Some people taking varenicline have had unusual or  aggressive behavior or forgetfulness while drinking alcohol.  Do not use other medicines to quit smoking, unless your doctor tells you to. Using varenicline while wearing a nicotine patch can cause unpleasant side effects.  This medicine may impair your thinking or reactions. You may also have mood or behavior changes when you quit smoking. Until you know how varenicline and the smoking cessation process are going to affect you, be careful if you drive or do anything that requires you to be cautious and alert.  What are the possible side effects of varenicline?  Get emergency medical help if you have signs of an allergic reaction: hives; difficulty breathing; swelling of your face, lips, tongue, or throat.  Stop using varenicline and call your doctor at once if you have:   a seizure (convulsions);   thoughts about suicide or hurting yourself;   strange dreams, sleepwalking, trouble sleeping;   new or worsening mental health problems --mood or behavior changes, depression, agitation, hostility, aggression;   heart attack symptoms --chest pain or pressure, pain spreading to your jaw or shoulder, nausea, sweating;   stroke symptoms --sudden numbness or weakness (especially on one side of the body), slurred speech, problems with vision or balance; or   severe skin reaction --swelling or redness of the skin, blisters in your mouth, or skin rash that spreads and causes blistering and peeling.  Your family or other caregivers should also be alert to changes in your mood or behavior.  Common side effects may include:   nausea (may persist for several months), vomiting;   constipation, gas;   sleep problems (insomnia); or   unusual dreams.  This is not a complete list of side effects and others may occur. Call your doctor for medical advice about side effects. You may report side effects to FDA at 1-800-FDA-1088.  What other drugs will affect varenicline?  After you stop smoking, the doses of your other  medications may need to be adjusted. Tell your doctor about all other medicines you use, especially:   insulin;   a blood thinner such as warfarin (Coumadin, Jantoven); or   asthma medicine (theophylline and others).  This list is not complete. Other drugs may interact with varenicline, including prescription and over-the-counter medicines, vitamins, and herbal products. Not all possible interactions are listed  in this medication guide.  Where can I get more information?  Your pharmacist can provide more information about varenicline.  Remember, keep this and all other medicines out of the reach of children, never share your medicines with others, and use this medication only for the indication prescribed.  Every effort has been made to ensure that the information provided by [employer]') is accurate, up-to-date, and complete, but no guarantee is made to that effect. Drug information contained herein may be time sensitive. [employer] information has been compiled for use by healthcare practitioners and consumers in the [place] and therefore [employer] does not warrant that uses outside of the [place] are appropriate, unless specifically indicated otherwise. [employer]'s drug information does not endorse drugs, diagnose patients or recommend therapy. [employer]'s drug information is an [employer], corporate to assist licensed healthcare practitioners in caring for their patients and/or to serve consumers viewing this service as a supplement to, and not a substitute for, the expertise, skill, knowledge and judgment of healthcare practitioners. The absence of a warning for a given drug or drug combination in no way should be construed to indicate that the drug or drug combination is safe, effective or appropriate for any given patient. [employer] does not assume any responsibility for any aspect of healthcare administered with the aid of information [employer] provides. The information contained  herein is not intended to cover all possible uses, directions, precautions, warnings, drug interactions, allergic reactions, or adverse effects. If you have questions about the drugs you are taking, check with your doctor, nurse or pharmacist.  Copyright [phone] [employer] Version: 8.03. Revision date: 08/08/2015.  Care instructions adapted under license by [facility]. If you have questions about a medical condition or this instruction, always ask your healthcare professional. [employer] disclaims any warranty or liability for your use of this information.       Patient Education        Blood Glucose: About This Test  What is it?    A blood glucose test measures the amount of a type of sugar, called glucose, in your blood. Several different types of blood glucose tests are used.   Fasting blood sugar (FBS) measures blood glucose after you have not eaten for at least 8 hours. It is often the first test done to check for prediabetes and diabetes.   Random blood sugar (RBS) measures blood glucose regardless of when you last ate.   Oral glucose tolerance test (OGTT) may be used to diagnose prediabetes and diabetes. This test is a series of blood glucose measurements taken after you drink a sweet liquid that contains glucose. This test is mostly used for pregnant women to check for gestational diabetes.  Why is this test done?  Blood glucose tests are done to:   Check for diabetes.   See how well treatment for diabetes is working.  The A1c is a different type of blood test used to diagnose diabetes.  How can you prepare for the test?  Be sure to tell your doctor about all the nonprescription and prescription medicines and herbs or other supplements you take. There are many medicines and supplements that can affect the results of these tests.  Fasting blood sugar (FBS)  For a fasting blood sugar test, do not eat or drink anything other than water for at least 8 hours before the blood sample  is taken.  If you have diabetes, you may be asked to wait until you have had  your blood tested before taking your morning dose of insulin or diabetes medicine.  Random blood sugar (RBS)  No special preparation is needed before having a random blood sugar test.  Oral glucose tolerance test  In the 3 days before this test, eat the way you are used to eating. In general, this means a well-balanced diet that contains at least 150 grams of carbohydrate a day. Good foods to eat are fruits, breads, cereals, grains, rice, crackers, potatoes, beans, and corn.  In the 8 hours right before the test, do not eat or drink anything (except water). Do not smoke, drink alcohol, or exercise.  What happens during the test?  A health professional takes a sample of your blood.  For a home glucose test, you may have a blood sample taken from your fingertip. This is done by:   Pricking your finger with a small needle (lancet) to collect a drop of blood.   Placing the blood on a special test strip.   Putting the test strip into a device called a blood glucose meter.  For the oral glucose tolerance test:   A blood sample will be taken when you arrive. This is your fasting blood glucose value. It will be compared with other samples during the day.   You will drink a sweet glucose liquid.   Depending on the reason for this test, blood samples may be taken 1, 2, or 3 hours after you drink the glucose. Blood samples may also be taken as soon as 30 minutes after you drink the glucose.  When should you call for help?  Watch closely for changes in your health, and be sure to contact your doctor if you have any problems.  Follow-up care is a key part of your treatment and safety. Be sure to make and go to all appointments, and call your doctor if you are having problems. It's also a good idea to keep a list of the medicines you take. Ask your doctor when you can expect to have your test results.  Where can you learn more?  Go to  [email or web address] and sign in to your MyChart account. Enter [phone] in the Search Health Information box to learn more about "Blood Glucose: About This Test."     If you do not have an account, please click on the "Sign Up Now" link.  Current as of: March 11, 2017  Content Version: 11.9   2006-2018 [employer]. Care instructions adapted under license by [facility]. If you have questions about a medical condition or this instruction, always ask your healthcare professional. [employer] disclaims any warranty or liability for your use of this information.         Patient Education        Home Blood Glucose Test: About This Test  What is it?    A home blood glucose test measures the amount of a type of sugar, called glucose, in your blood.  Why is this test done?  People who have diabetes need to check the amount of glucose in their blood. A home blood glucose test is an easy way to test your blood at home or when you are away from home. The results help you know when to take action to keep your blood glucose levels in a target range.  How can you prepare for the test?   Check the expiration date on the bottle of testing strips. Do not use test strips that have expired.   Match  the code number on the testing strips bottle with the number on the meter. If the numbers do not match, follow the directions with the meter for changing the code number.  What happens before the test?  The supplies you will need for testing blood glucose include:   A blood glucose meter.   Testing strips. These are made to be used with a specific model of meter.   Sugar control solutions. Some meters require a specific solution. Many new meters are made to operate without a control solution.   Short needles called lancets for pricking your skin.   A pen-sized holder for the lancet (lancet device), which positions the lancet and controls how deeply it goes into your skin.   Clean cotton  balls. These are used to stop the bleeding from the testing site.  What happens during the test?  A home blood glucose test involves pricking your finger, palm, or forearm with a lancet to collect a drop of blood. The blood drop is placed on a test strip, which you insert into the blood glucose meter. The instructions for testing are slightly different for each blood glucose meter model. Follow the instructions that came with your meter.   Wash your hands with warm, soapy water. Dry them well with a clean towel. You may also use an alcohol wipe to clean your finger or other site, but make sure your hands are dry before the test.   Insert a clean lancet into the lancet device.   Remove a test strip from the test strip bottle. Replace the lid immediately to keep moisture away from the other strips.   Follow the instructions that came with your meter to get it ready.   Use the lancet device to stick the side of your fingertip with the lancet. Do not stick the tip of your finger. Some blood sugar meters use lancet devices that take the blood sample from other sites, such as the palm of the hand or the forearm. But the finger is usually the most accurate place to test blood sugar.   Put a drop of blood on the correct spot on the test strip.   Apply pressure with a clean cotton ball to stop the bleeding.   Follow the directions that came with the meter to get the results.   Write down the results and the time that you tested your blood. Some meters will store the results for you.  What else should you know about the test?  The American Diabetes Association (ADA) recommends that you stay within the following blood glucose level ranges. But depending on your health, you and your doctor may set a different range for you.  For nonpregnant adults with diabetes:   80 milligrams per deciliter (mg/dL) to 621 mg/dL before a meal   Less than 180 mg/dL 1 to 2 hours after a meal  For women who have diabetes related to  pregnancy (gestational diabetes):   95 mg/dL or less before breakfast   120 to 140 mg/dL (or lower) 1 to 2 hours after a meal  How long does the test take?   The blood glucose meter will show the results of the test in a minute or less.  Where can you learn more?  Go to [email or web address] and sign in to your MyChart account. Enter [phone] in the Search Health Information box to learn more about "Home Blood Glucose Test: About This Test."     If you do not have  an account, please click on the "Sign Up Now" link.  Current as of: March 11, 2017  Content Version: 11.9   2006-2018 [employer]. Care instructions adapted under license by [facility] ([facility] If you have questions about a medical condition or this instruction, always ask your healthcare professional. Health[employer], Inc[employer] disclaims any warranty or liability for your use of this information.         Patient Education        Fecal Immunochemical Test (FIT): About This Test  What is it?    A fecal immunochemical test, or FIT, checks for hidden blood in the stool. Your doctor gives you a kit that contains everything you need. At home, you follow simple steps to collect a small amount of stool. You return the kit to the doctor or to a lab.  Why is this test done?  This test is done to check for colorectal cancer and other types of gastrointestinal problems, such as hemorrhoids, anal fissures, and colon polyps, that can cause blood in the stools.  How can you prepare for the test?   Don't do the test during your menstrual period or if you are having bleeding from hemorrhoids.  What happens during the test?  There are different types of home tests available. It is important to follow the instructions provided with any test.  Here are some general instructions:   Check the expiration date on the package. Don't use a test kit after its expiration date.   Follow the instructions exactly. Do all the steps, in order, without skipping  any of them.   After you finish your test, follow the instructions that you were given for returning the test.  There is a FIT test that shows the results right away. If your test shows that blood was found in your stool sample, call your doctor as soon as possible.  Follow-up care is a key part of your treatment and safety. Be sure to make and go to all appointments, and call your doctor if you are having problems. It's also a good idea to keep a list of the medicines you take. Ask your doctor when you can expect to have your test results.  Where can you learn more?  Go to [email or web address] and sign in to your MyChart account. Enter [phone] in the Search Health Information box to learn more about "Fecal Immunochemical Test (FIT): About This Test."     If you do not have an account, please click on the "Sign Up Now" link.  Current as of: November 11, 2016  Content Version: 11.9   2006-2018 [employer], [employer]. Care instructions adapted under license by [facility]. If you have questions about a medical condition or this instruction, always ask your healthcare professional. [employer], Inc[employer] disclaims any warranty or liability for your use of this information.

## 2017-12-21 NOTE — Telephone Encounter (Signed)
faxed

## 2017-12-21 NOTE — Telephone Encounter (Signed)
Name of Caller: Olevia Bowens phone number: [phone]    Relationship to Patient: [facility]    Provider: Dr. Olivia Mackie    Practice:  [facility]    Chief Complaint/Reason for Call: [place] [facility] is requesting that office visit notes from 05.03.2019 be faxed their office for this patient.  FAX [phone]    Best time of day caller can be reached: Any       Patient advised that office/PCP has 24-48 business hours to return their call: No

## 2017-12-22 ENCOUNTER — Encounter

## 2017-12-22 MED ORDER — GABAPENTIN 300 MG PO CAPS
300 MG | ORAL_CAPSULE | Freq: Two times a day (BID) | ORAL | 2 refills | Status: DC
Start: 2017-12-22 — End: 2018-02-11

## 2017-12-22 NOTE — Telephone Encounter (Signed)
Name of Caller: Quintell    Contact phone number: verified on file     Relationship to Patient: patient      Provider: Tyree    Practice:  [facility]    Chief Complaint/Reason for Call: pt almost out of gabapentin due to increase in dosage from 100 to  300 and would like it called into [facility] - [place], [place] - [address] - P [phone] - F [phone]    Also the [place] needs PA or an alternative med    Best time of day caller can be reached: anytime       Patient advised that office/PCP has 24-48 business hours to return their call:

## 2017-12-22 NOTE — Telephone Encounter (Signed)
That was a dose adjustment.  I will call I the  capsules.  No need for PA at this time unless new rx not covered.

## 2017-12-22 NOTE — Telephone Encounter (Signed)
Will work on the PA today.  Patient states he is almost out as he has had a dose increase.  Last OV 12/18/2017, Next OV 03/29/2018      Looking at this we sent him a refill to her pharmacy take 3 pills by mouth 2 days a day for 90 days.  Please advise if refill appropriate or if this was an error.

## 2017-12-29 ENCOUNTER — Telehealth

## 2017-12-29 MED ORDER — ALOGLIPTIN BENZOATE 25 MG PO TABS
25 | ORAL_TABLET | Freq: Every day | ORAL | 3 refills | Status: DC
Start: 2017-12-29 — End: 2018-02-11

## 2017-12-29 NOTE — Telephone Encounter (Signed)
PA for Januvia was denied. Stated pt has to try Nesina first. Please advise.     Letter scanned into media

## 2017-12-29 NOTE — Telephone Encounter (Signed)
Spoke with Bjorn Loser and informed her of the med change.

## 2017-12-29 NOTE — Telephone Encounter (Signed)
nesina prescriptions sent to the pharmacy.  Please inform patient of the change and reason.

## 2018-01-04 ENCOUNTER — Inpatient Hospital Stay
Admit: 2018-01-04 | Discharge: 2018-01-05 | Disposition: A | Discharge status: Home / Self Care | Attending: Emergency Medicine

## 2018-01-04 DIAGNOSIS — T40601A Poisoning by unspecified narcotics, accidental (unintentional), initial encounter: Secondary | ICD-10-CM

## 2018-01-04 LAB — URINE DRUG SCREEN
Amphetamines, urine: POSITIVE NA
Barbiturates, Urine: NEGATIVE NA
Benzodiazepine Ur Qual: NEGATIVE NA
Cocaine Metabolites, Ur: NEGATIVE NA
Methadone, Urine: NEGATIVE NA
Opiates, Urine: POSITIVE NA
Oxycodone Screen, Ur: NEGATIVE NA
PCP, Urine: NEGATIVE NA

## 2018-01-04 LAB — POCT GLUCOSE
POC Glucose: 342 mg/dL — ABNORMAL HIGH (ref 70–100)
QC OK?: 342

## 2018-01-04 LAB — ETHANOL: Ethanol Lvl: <0.01 g/dL (ref 0.000–0.010)

## 2018-01-04 MED ORDER — NALOXONE HCL 2 MG/2ML IJ SOSY
2 MG/ML | Freq: Once | INTRAMUSCULAR | Status: AC
Start: 2018-01-04 — End: 2018-01-04
  Administered 2018-01-04: 17:00:00 2 mg via INTRAVENOUS

## 2018-01-04 MED ORDER — NALOXONE HCL 2 MG/2ML IJ SOSY
2 MG/ML | Freq: Once | INTRAMUSCULAR | Status: AC
Start: 2018-01-04 — End: 2018-01-04
  Administered 2018-01-04: 20:00:00 2 mg via INTRAOSSEOUS

## 2018-01-04 MED ORDER — NALOXONE HCL 2 MG/2ML IJ SOSY
2 MG/ML | INTRAMUSCULAR | Status: AC
Start: 2018-01-04 — End: 2018-01-04
  Administered 2018-01-04: 20:00:00

## 2018-01-04 MED FILL — NALOXONE HCL 2 MG/2ML IJ SOSY: 2 mg/mL | INTRAMUSCULAR | Qty: 2

## 2018-01-04 NOTE — ED Notes (Signed)
Pt resting quietly in bed. Patient requesting ginger ale and graham crackers- nurse will continue to monitor pt      Pearlie Oyster, RN  01/04/18 1730

## 2018-01-04 NOTE — ED Notes (Signed)
Bed: 23  Expected date:   Expected time:   Means of arrival:   Comments:  EMS     Celester Lech A. Marga Hoots, RN  01/04/18 1250

## 2018-01-04 NOTE — ED Notes (Signed)
Report received from [place], RN      Pearlie Oyster, RN  01/04/18 1630

## 2018-01-04 NOTE — ED Provider Notes (Signed)
[facility] EMERGENCY DEPT  eMERGENCY dEPARTMENT eNCOUnter      Pt Name: Mason Garza  MRN: [id number]  Birthdate 1965/09/25  Date of evaluation: 01/04/2018  Provider: Benjaman Pott, PA-C     CHIEF COMPLAINT       Chief Complaint   Patient presents with   ??? Drug Overdose     pt was brought in by [facility]- per squad "patient was found unresponsive on a porch. 4 mg IN of narcan given, IO placed and 2 mg narcan given- patient responsed"      I evaluated and treated this patient in conjunction with supervising physician Dr. Orville Govern.  Diagnostics, plan, and disposition was discussed with supervising physician.    HISTORY OF PRESENT ILLNESS   (Location/Symptom, Timing/Onset,Context/Setting, Quality, Duration, Modifying Factors, Severity) Note limiting factors.   HPI    Mason Garza is a 52 y.o. male who presents to the emergency department with complaint of drug overdose. Patient was brought in by [facility] department found unresponsive. He was given a total of 4 mg of Narcan in the field and did respond. In the ER he is still slightly drowsy. He is maintaining his airway. He denies any pain anywhere. No chest pain, short of breath, fever, chills, sweats, nausea, vomiting. No lightheadedness or dizziness. Denies any cough. He denies any alcohol use. Denies suicidal or homicidal ideation.      I have reviewed the patient's personal and family past medical history as well as the nurse's notes and I agree. Personal history and family past medical history as listed in this chart.  I havereviewed the patient's vitals and agree.    REVIEW OF SYSTEMS    (2+ forlevel 4; 10+ for level 5)   Review of Systems  This patient's personal and family past medical history as stated in HPI and otherwise unremarkable.  ROS as stated in HPI otherwise unremarkable,a total of 10 systems reviewed.    PAST MEDICAL HISTORY     Past Medical History:   Diagnosis Date   ??? Chronic hepatitis C (HCC) 11/22/2015    active IVDU   ??? H/O echocardiogram  05/12/11    EF62%, mild LAE   ??? Neuropathy    ??? 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 Psychiatric problem    ??? Type II or unspecified type diabetes mellitus without mention of complication, not stated as uncontrolled        SURGICAL HISTORY       Past Surgical History:   Procedure Laterality Date   ??? OTHER SURGICAL HISTORY Bilateral 10/06/2017    Irrigation and Debridement of Abscesses arms and scalp       CURRENT MEDICATIONS       Previous Medications    ALCOHOL SWABS (ALCOHOL PADS) 70 % PADS    1 Package by Does not apply route 3 times daily (before meals)    ALOGLIPTIN (NESINA) 25 MG TABS TABLET    Take 1 tablet by mouth daily    BACLOFEN (LIORESAL) 10 MG TABLET    Take daily then decrease to 1/2 tab daily.  Discontinue in 1 week if feeling well.    BLOOD GLUCOSE MONITOR STRIPS    Test 3 times a day & as needed for symptoms of irregular blood glucose.dispense as covered by insurance    CEPHALEXIN (KEFLEX) 500 MG CAPSULE    Take 500 mg by mouth 3 times daily    GABAPENTIN (NEURONTIN)  300 MG CAPSULE    Take 1 capsule by mouth 2 times daily for 90 days. Intended supply: 30 days    GLUCOSE MONITORING KIT (FREESTYLE) MONITORING KIT    1 kit by Does not apply route daily Dispense as covered by insurance    KETOCONAZOLE (NIZORAL) 2 % CREAM    Apply topically daily to both feet/toes for 3 weeks    LANCETS MISC    Dispense lancet device DISPENSE AS COVERED BY INSURANCE    LANCETS MISC    1 each by Does not apply route 3 times daily (before meals) DISPENSE AS COVERED BY INSURANCE    METFORMIN (GLUCOPHAGE-XR) 500 MG EXTENDED RELEASE TABLET    Take 1 tablet by mouth 2 times daily (with meals)    NAPROXEN (NAPROSYN) 500 MG TABLET    Take 1 tablet by mouth 2 times daily as needed for Pain    VARENICLINE (CHANTIX) 0.5 MG TABLET    Take 1-2 tablets by mouth See Admin Instructions 0.5mg  DAILY for 3 days followed by 0.5mg  TWICE DAILY for 4 days followed by 1mg  DAILY       ALLERGIES     Patient has no known  allergies.    FAMILY HISTORY       Family History   Problem Relation Age of Onset   ??? Lung Cancer Mother 47   ??? Colon Cancer Father 61   ??? Cancer Brother 60        renal cell cancer        SOCIAL HISTORY       Social History     Socioeconomic History   ??? Marital status: Divorced     Spouse name: None   ??? Number of children: None   ??? Years of education: None   ??? Highest education level: None   Occupational History   ??? None   Social Needs   ??? Financial resource strain: None   ??? Food insecurity:     Worry: None     Inability: None   ??? Transportation needs:     Medical: None     Non-medical: None   Tobacco Use   ??? Smoking status: Current Every Day Smoker     Packs/day: 1.00     Years: 30.00     Pack years: 30.00     Types: Cigarettes   ??? Smokeless tobacco: Never Used   ??? Tobacco comment: nicotene patch requested   Substance and Sexual Activity   ??? Alcohol use: No     Alcohol/week: 0.0 oz     Comment: 6 pack per yr now; former 6-12 pk per day for 10 yrs   ??? Drug use: Yes     Frequency: 10.0 times per week     Types: Opiates , IV, Methamphetamines     Comment: heroin IVDU daily   ??? Sexual activity: Yes     Partners: Female   Lifestyle   ??? Physical activity:     Days per week: None     Minutes per session: None   ??? Stress: None   Relationships   ??? Social connections:     Talks on phone: None     Gets together: None     Attends religious service: None     Active member of club or organization: None     Attends meetings of clubs or organizations: None     Relationship status: None   ??? Intimate partner violence:     Fear  of current or ex partner: None     Emotionally abused: None     Physically abused: None     Forced sexual activity: None   Other Topics Concern   ??? None   Social History Narrative    [place] 12/2017    Looking for a place to live       SCREENINGS           PHYSICAL EXAM    (up to 7 for level 4, 8 or more for level 5)     ED Triage Vitals [01/04/18 1253]   BP Temp Temp Source Pulse Resp SpO2 Height  Weight   116/80 98.1 ??F (36.7 ??C) Oral 90 20 95 % 5\' 11"  (1.803 m) 176 lb (79.8 kg)       Physical Exam  Constitutional: Patient is AAO x3, appears to be well-nourished and hydrated.  Vitalsas stated above.   Psych: Appropriate mood and affect for chief complaint.  Patient is calm and pleasant.   Integumentary: Skin intact, no erythema, no ecchymosis, no soft tissue swelling, skin is warm and dry.  Neuro: Patient has sensation intact, no gross sensory or motor deficit.      Vascular: Good radial pulses bilaterally.  Capillary refill of all fingers less than 2 seconds.     Cardiac: Regular rhythm and rate, S1-S2 are bothaudible.  No murmurs rubs or gallops.   Respiratory: Lungs clear to auscultation in all fields.  No tachypnea.  Patient speaks in full sentences.  Extremities: Skin is warm and dry. No soft tissueswelling or edema.    HENT: Head appears atraumatic and normocephalic.  Trachea midline.  Neck is supple.  Throat, oral and nasal mucosa is moistand pink.   Eyes: Conjunctivae are clear. Full extraocular eye movements intact.  PERRL.      LABS:  Labs Reviewed   POCT GLUCOSE - Abnormal; Notable for the following components:       Result Value    POC Glucose 342 (*)     All other components within normal limits    Narrative:     Test Performed by [facility], [address], [place], [place]  [address]   POCT GLUCOSE - Normal   ETHANOL    Narrative:     Test Performed by [facility], [address] [place], [place]  [address]   URINE DRUG SCREEN    Narrative:     Test Performed by [facility], [address], [place], [place]  [address]        All other labs were within normal range or not returned as of this dictation.    EMERGENCY DEPARTMENT COURSE and DIFFERENTIAL DIAGNOSIS/MDM:   Vitals:    Vitals:    01/04/18 1253 01/04/18 1321 01/04/18 1707 01/04/18 1729   BP: 116/80 133/77 (!) 102/58 111/79   Pulse: 90 87 65 66   Resp: 20 18 20 18    Temp: 98.1 ??F (36.7 ??C)      TempSrc: Oral      SpO2: 95% 96% 97% 95%    Weight: 79.8 kg (176 lb)      Height: 5\' 11"  (1.803 m)          Medications   naloxone (NARCAN) 2 MG/2ML injection (  Given 01/04/18 1613)   naloxone [facility]) injection 2 mg (2 mg Intravenous Given 01/04/18 1321)   naloxone (NARCAN) injection 2 mg (2 mg Intraosseous Given 01/04/18 1613)  MDM  The patient came here with complaint of drug overdose.  By my findings the patient seems to be suffering from opiate overdose.    Diagnostic studies were not required for this patient.      Risk factors include drug overdose today.    Weconsidered opiate overdose in our differential diagnosis.    This patient does not appear to be septic or toxic. No evidence of focal deficits or weakness.    Treatment provided here:  Patient was given 2 more milligrams of Narcan in the ED today.    Plan will be to discharge this patient when clinically sober.    Please see attending physician note for further information regarding this patient's disposition and diagnosis.  REVAL:   On reevaluation the patient has improvement from baseline.  We discussed the findings/plan as appropriate and the patient agrees.        Comment: Please note this report has been produced using speech recognition software and may contain errors related to that system including errors in grammar, punctuation, and spelling, as wellas words and phrases that may be inappropriate.  If there are any questions or concerns please feel free to contact the dictating provider for clarification.      PROCEDURES:  Unless otherwise noted below, none     Procedures    FINAL IMPRESSION      1. Opiate overdose, accidental or unintentional, initial encounter (HCC)          DISPOSITION/PLAN   DISPOSITION        PATIENT REFERRED TO:  No follow-up provider specified.    DISCHARGE MEDICATIONS:  New Prescriptions    No medications on file          (Please note:  Portions of this note werecompleted with a voice recognition program.  Efforts were made to edit the dictations but  occasionally words and phrases are mis-transcribed.)  Form v2016.J.[id number]    Sarina Ill (electronically signed)  Emergency Medicine Provider        Benjaman Pott, PA-C  01/04/18 1812

## 2018-01-04 NOTE — ED Provider Notes (Signed)
Emergency Department Encounter  [facility] EMERGENCY DEPT    Patient: Mason Garza  MRN: [id number]  DOB: Jul 25, 1966  Date of Evaluation: 01/04/2018  ED Supervising Physician: Winona Legato, MD    I independently examined and evaluated [name].    In brief, [name] is a 52 y.o. male that presents to the emergency department complaining of unintentional overdose, [facility], was found unresponsive on porch, given 4 mg of intranasal Narcan, and 2 mg of intramuscular Narcan    Focused exam: Awake alert and oriented after another dose of 2 mg of intramuscular Narcan heart regular rate and rhythm    Brief ED course/MDM: Patient was observed for greater than 2 hours, we will discharge with follow-up    Diagnosis/Plan: Unintentional overdose    All diagnostic, treatment, and disposition decisions were made by myself in conjunction with the APP/Resident. For all further details of the patient's emergency department visit, please see their documentation.    Comment: Please note this report has been produced using speech recognition software and may contain errors related to that system including errors in grammar, punctuation, and spelling, as well as words and phrases that may be inappropriate.  If there are any questions or concerns please feel free to contact the dictating provider for clarification.    Winona Legato, MD  [employer]        Winona Legato, MD  01/04/18 [phone]

## 2018-01-04 NOTE — ED Notes (Signed)
Pt ambulated to bathroom without difficulty. A&Ox4. NAD Dr. Clois Comber notified     Danella Penton, RN  01/04/18 2037

## 2018-01-04 NOTE — ED Notes (Signed)
Resting quietly in bed. NAD. Respirations even and non labored. Easily arousable.      Danella Penton, RN  01/04/18 1925

## 2018-01-04 NOTE — ED Notes (Signed)
Pt resting with eyes closed in bed.  Awakens easily to name.  Requesting that light be turned off so that he can sleep a bit.  Light turned off.     Olam Idler, RN  01/04/18 1434

## 2018-01-04 NOTE — ED Provider Notes (Signed)
THIS NOTE CONTAINS PATIENT DISPOSITION.    USE THIS NOTE.    PATIENT WAS SIGNED OUT TO ME BY DR. Wellock.    Please see his/her initial documentation for details of the patient's initial ED presentation, physical exam and completed studies.    In brief, patient is a 52 year old male who overdosed on unknown drugs earlier today. He has been difficult to keep consistently awake. EMS inserted in intraosseous needle in the LEFT tibia, and administered Narcan. The ED has given him 2 additional doses of Narcan as he keeps falling asleep. Right now he is awake, but still slurring his speech, and difficult to keep him awake for very long. No trauma. When the patient is awake, he is able to tell me that he took "some drugs", but cannot remember what they were. He denies trying to kill himself or harm himself. This was not a suicidal gesture he was doing this to "relax and get high". Patient denies any pain anywhere with the exception of the LEFT tibial IO needle. He is sleepy but easily arousable. He can only stay awake for about 30 seconds at a time.     General: Lethargic disheveled middle-aged adult male lying semi-fowler's in bed, sleepy but easily arousable    HENT: Head NCAT, EOMI with no erythema, swelling or D/C. Pupils 3 mm PERRL  TMs normal name dependent  Nares normal no CSF rhinorrhea or otorrhea Oropharyngeal mucus membranes dry    Neck: Full ROM, supple, no rigidity    Cardio: RRR, nl s1 s2 no m/r/g, extremities warm, dry, well perfused, non-edematous    Lungs: CTAB, no wheezes, rales, rhonchi, normal work of breathing    Abdomen: Soft, NT, ND, non-rigid, BS x 4 normal    MSK: No midline cervical thoracic or lumbar spine tends to palpation    Skin: Warm, dry, pink, no rashes, bruising, or lacerations, no petechiae, no purpura. There is a LEFT tibial IO needle inserted. No signs of extravasation or infection or bleeding    Neuro: Sleepy but easily arousable. Can only stay awake for about 30 seconds at a time.  Able to answer simple questions follow simple commands    Psych: Denies suicidal ideation or homicidal ideation. This was an accidental overdose.    The plan is to get a glucose check as the patient is a diabetic on metformin to make sure he is not hypoglycemic, hydrate the patient as he appears dehydrated, given alcohol level and tox screen.    Bedside Accu-Chek 324. Patient is not hypoglycemic. Normal saline IV fluid infusion should help correct mild hyperglycemia. Patient's compliance with metformin regimen is questionable.    Alcohol level negative  Tox screen positive for amphetamines and opioids. Some of the patient's sleepiness may be secondary to the opioid use and some of it may be secondary to the amphetamines wearing off and leaving him with the "dysphoric crash"    Upon reevaluation, patient reiterates he is not suicidal or homicidal or depressed. He says that he was using the drugs in an effort to get high, not to hurt himself. He has no plans to hurt other people either. He still needs additional time to recover as he cannot stay awake for more than about 5 minutes at this time    After several hours in the emergency department, patient returned to a normal mental status. He was consistently awake, alert, sitting up in bed, looks and feels better, was ready to go home.    I instructed  the patient not combined opioid drugs with any other drugs that might make him sleepy or suppress his respiratory drive, and to do his best to avoid entirely as they are potentially lethal in a single dose. I recommended he follow up with his primary care physician to ensure he is feeling better, and we also connected him with multiple different detox programs in the area so that he can get treatment for his opioid abuse. If for whatever reason the patient develops extreme fatigue, difficulty staying awake, appears of amnesia or confusion, feels weak feels faint or passes out, you return to the ED immediately. However at  this time, he should be stable for outpatient management.    Results of workup and plan of care discussed with patient.  He indicated understanding. Strict return precautions and anticipatory guidance given. All questions answered to his satisfaction. Patient discharged home in improved condition.    Clinical impression: Opioid overdose    Patient was seen and evaluated by the Advanced Practice Provider as well as myself. I agree with the history, physical exam findings, and plan unless otherwise stated.     Pia Mau, MD  01/05/18 0200

## 2018-01-04 NOTE — ED Notes (Signed)
Report given to Lennox Laity, RN      Pearlie Oyster, RN  01/04/18 1359

## 2018-01-04 NOTE — ED Notes (Signed)
Report given to [place], RN      Pearlie Oyster, RN  01/04/18 [phone]

## 2018-01-04 NOTE — Discharge Instructions (Signed)
You have been seen in the Emergency Department for overdosing on opioids.  We have revived you here in the emergency department. The rest of your lab work, aside from the presence of diabetes, was normal. He needs to avoid combining opioids with alcohol or other drugs that could potentially make you sleepy, and to get off of the opioids as soon as possible as there potentially lethal in a single overdose.    After a visit to the Emergency Department, follow-up is important. You should follow-up with her primary care provider by the end of the week to ensure you are feeling better. I have also enclosed multiple detox resources for you to help you get treatment for your drug problem.    Return to the ED if you develop excessive sleepiness, difficulty staying awake, confusion, periods of amnesia, you feel weak feel faint or pass out, you feel like harming yourself or others, or if any new signs, symptoms, or concerns arise.

## 2018-01-04 NOTE — Other (Unsigned)
Patient Acct Nbr: [id number]   Primary AUTH/CERT:   Primary Insurance Company Name: [employer]  Primary Insurance Plan name: [facility]  Primary Insurance Group Number: [id number]  Primary Insurance Plan Type: Health  Primary Insurance Policy Number: [id number]

## 2018-01-06 ENCOUNTER — Encounter: Attending: Internal Medicine | Primary: Internal Medicine

## 2018-01-12 NOTE — Telephone Encounter (Signed)
Pt seen in [facility] on 01/04/18  Reason:Drug overdose     ED visits in the last yr:6  Hosp/ED adm risk %:84     Per ED discharge instructions: Follow up with Ellene Route, MD (Internal Medicine) on 01/08/2018    ED letter sent to home address.

## 2018-02-11 ENCOUNTER — Inpatient Hospital Stay
Admit: 2018-02-11 | Discharge: 2018-02-11 | Disposition: A | Discharge status: Home / Self Care | Attending: Emergency Medicine

## 2018-02-11 DIAGNOSIS — L03113 Cellulitis of right upper limb: Secondary | ICD-10-CM

## 2018-02-11 MED ORDER — SULFAMETHOXAZOLE-TRIMETHOPRIM 800-160 MG PO TABS
800-160 MG | Freq: Once | ORAL | Status: AC
Start: 2018-02-11 — End: 2018-02-11
  Administered 2018-02-11: 07:00:00 1 via ORAL

## 2018-02-11 MED ORDER — NAPROXEN 500 MG PO TABS
500 MG | ORAL_TABLET | Freq: Two times a day (BID) | ORAL | 0 refills | Status: DC | PRN
Start: 2018-02-11 — End: 2019-01-25

## 2018-02-11 MED ORDER — NAPROXEN 500 MG PO TABS
500 MG | Freq: Once | ORAL | Status: AC
Start: 2018-02-11 — End: 2018-02-11
  Administered 2018-02-11: 07:00:00 500 mg via ORAL

## 2018-02-11 MED ORDER — SULFAMETHOXAZOLE-TRIMETHOPRIM 800-160 MG PO TABS
800-160 MG | ORAL_TABLET | Freq: Two times a day (BID) | ORAL | 0 refills | Status: AC
Start: 2018-02-11 — End: 2018-02-18

## 2018-02-11 MED FILL — SULFAMETHOXAZOLE-TRIMETHOPRIM 800-160 MG PO TABS: 800-160 mg | ORAL | Qty: 1

## 2018-02-11 MED FILL — NAPROXEN 500 MG PO TABS: 500 mg | ORAL | Qty: 1

## 2018-02-11 NOTE — Other (Unsigned)
Patient Acct Nbr: [id number]   Primary AUTH/CERT:   Primary Insurance Company Name: [employer]  Primary Insurance Plan name: [facility]  Primary [facility] Group Number: [id number]  Primary Insurance Plan Type: Health  Primary Insurance Policy Number: [id number]

## 2018-02-11 NOTE — ED Triage Notes (Signed)
C/O abscess  To rightt forearm. Says recently had surg for staph infection

## 2018-02-11 NOTE — ED Provider Notes (Signed)
[facility] EMERGENCY DEPT  eMERGENCY dEPARTMENT eNCOUnter      Pt Name: Mason Garza  MRN: [id number]  Birthdate 1965-10-12  Date of evaluation: 02/11/2018  Provider: Basilio Cairo, MD     CHIEF COMPLAINT       Chief Complaint   Patient presents with   ??? Abscess     right forearm         HISTORY OF PRESENT ILLNESS   (Location/Symptom, Timing/Onset,Context/Setting, Quality, Duration, Modifying Factors, Severity) Note limiting factors.   HPI    Mason Garza is a 52 y.o. male who presents to the emergency department increasing pain and swelling right forearm.  Patient denies any fever chills nausea or vomiting.     Nursing Notes were reviewed.    REVIEW OF SYSTEMS    (2+ forlevel 4; 10+ for level 5)      Review of Systems   Constitutional: Negative for chills and fever.   HENT: Negative for facial swelling and trouble swallowing.    Eyes: Negative for photophobia and visual disturbance.   Respiratory: Negative for chest tightness and shortness of breath.    Cardiovascular: Negative for chest pain and palpitations.   Gastrointestinal: Negative for abdominal pain and vomiting.   Genitourinary: Negative for flank pain and hematuria.   Musculoskeletal: Negative for joint swelling and neck pain.   Skin: Positive for rash. Negative for wound.   Neurological: Negative for dizziness, light-headedness and headaches.   Psychiatric/Behavioral: Negative for agitation and confusion.       PAST MEDICAL HISTORY     Past Medical History:   Diagnosis Date   ??? Chronic hepatitis C (HCC) 11/22/2015    active IVDU   ??? H/O echocardiogram 05/12/11    EF62%, mild LAE   ??? Neuropathy    ??? 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 Psychiatric problem    ??? Type II or unspecified type diabetes mellitus without mention of complication, not stated as uncontrolled        SURGICALHISTORY       Past Surgical History:   Procedure Laterality Date   ??? OTHER SURGICAL HISTORY Bilateral 10/06/2017    Irrigation and Debridement of  Abscesses arms and scalp       CURRENT MEDICATIONS       Previous Medications    ALCOHOL SWABS (ALCOHOL PADS) 70 % PADS    1 Package by Does not apply route 3 times daily (before meals)    BLOOD GLUCOSE MONITOR STRIPS    Test 3 times a day & as needed for symptoms of irregular blood glucose.dispense as covered by insurance    GLUCOSE MONITORING KIT (FREESTYLE) MONITORING KIT    1 kit by Does not apply route daily Dispense as covered by insurance    LANCETS MISC    Dispense lancet device DISPENSE AS COVERED BY INSURANCE    LANCETS MISC    1 each by Does not apply route 3 times daily (before meals) DISPENSE AS COVERED BY INSURANCE       ALLERGIES     Patient has no known allergies.    FAMILY HISTORY       Family History   Problem Relation Age of Onset   ??? Lung Cancer Mother 54   ??? Colon Cancer Father 57   ??? Cancer Brother 65        renal cell cancer          SOCIAL  HISTORY       Social History     Socioeconomic History   ??? Marital status: Divorced     Spouse name: Not on file   ??? Number of children: Not on file   ??? Years of education: Not on file   ??? Highest education level: Not on file   Occupational History   ??? Not on file   Social Needs   ??? Financial resource strain: Not on file   ??? Food insecurity:     Worry: Not on file     Inability: Not on file   ??? Transportation needs:     Medical: Not on file     Non-medical: Not on file   Tobacco Use   ??? Smoking status: Current Every Day Smoker     Packs/day: 1.00     Years: 30.00     Pack years: 30.00     Types: Cigarettes   ??? Smokeless tobacco: Never Used   ??? Tobacco comment: nicotene patch requested   Substance and Sexual Activity   ??? Alcohol use: No     Alcohol/week: 0.0 oz     Comment: 6 pack per yr now; former 6-12 pk per day for 10 yrs   ??? Drug use: Yes     Frequency: 10.0 times per week     Types: Opiates , IV, Methamphetamines     Comment: heroin IVDU daily   ??? Sexual activity: Yes     Partners: Female   Lifestyle   ??? Physical activity:     Days per week: Not on  file     Minutes per session: Not on file   ??? Stress: Not on file   Relationships   ??? Social connections:     Talks on phone: Not on file     Gets together: Not on file     Attends religious service: Not on file     Active member of club or organization: Not on file     Attends meetings of clubs or organizations: Not on file     Relationship status: Not on file   ??? Intimate partner violence:     Fear of current or ex partner: Not on file     Emotionally abused: Not on file     Physically abused: Not on file     Forced sexual activity: Not on file   Other Topics Concern   ??? Not on file   Social History Narrative    [place] house 12/2017    Looking for a place to live       SCREENINGS      @FLOW ([phone])@    PHYSICAL EXAM    (5+ for level 4, 8+ for level 5)     ED Triage Vitals [02/11/18 0230]   BP Temp Temp Source Pulse Resp SpO2 Height Weight   (!) 143/91 99.1 ??F (37.3 ??C) Oral 123 20 95 % 5\' 11"  (1.803 m) 185 lb (83.9 kg)       Physical Exam   Constitutional: He is oriented to person, place, and time. He appears well-developed and well-nourished.   HENT:   Head: Normocephalic and atraumatic.   Nose: Nose normal.   Mouth/Throat: Oropharynx is clear and moist.   Eyes: Pupils are equal, round, and reactive to light. Conjunctivae and EOM are normal.   Neck: Normal range of motion. Neck supple.   Cardiovascular: Regular rhythm, normal heart sounds and intact distal pulses.   tachycardia  Pulmonary/Chest: Effort normal and breath sounds normal.   Abdominal: Bowel sounds are normal. There is no tenderness.   Musculoskeletal: He exhibits tenderness. He exhibits no deformity.   Right forearm with approximately 3 x 3 area of erythema and induration.  No fluctuance or discharge.  No red streaking.  The elbow wrist and hand are nontender   Neurological: He is alert and oriented to person, place, and time.   Skin: Skin is warm and dry. Capillary refill takes less than 2 seconds. Rash noted. There is erythema.   Psychiatric:  He has a normal mood and affect. His behavior is normal.   Nursing note and vitals reviewed.      DIAGNOSTIC RESULTS     EKG (Per Emergency Physician)       RADIOLOGY (Per Emergency Physician):       Interpretation per the Radiologist below, if available at the time of this note:   No results found.    LABS:  Labs Reviewed - No data to display    All other labs were within normal range or not returned as of this dictation.    EMERGENCY DEPARTMENT COURSE and DIFFERENTIAL DIAGNOSIS/MDM:   Vitals:    Vitals:    02/11/18 0230   BP: (!) 143/91   Pulse: 123   Resp: 20   Temp: 99.1 ??F (37.3 ??C)   TempSrc: Oral   SpO2: 95%   Weight: 83.9 kg (185 lb)   Height: 5\' 11"  (1.803 m)       Medications   sulfamethoxazole-trimethoprim (BACTRIM DS;SEPTRA DS) 800-160 MG per tablet 1 tablet (has no administration in time range)   naproxen (NAPROSYN) tablet 500 mg (has no administration in time range)       MDM  Number of Diagnoses or Management Options  Abscess of right upper extremity:   Cellulitis of right upper extremity:   Diagnosis management comments: Asian early abscess cellulitis fairly poor.  We will treat antibiotics warm compresses and close follow-up.  .    CONSULTS:  None    PROCEDURES:  Unless otherwise noted below, none     Procedures    FINAL IMPRESSION      1. Cellulitis of right upper extremity New Problem   2. Abscess of right upper extremity New Problem         DISPOSITION/PLAN   DISPOSITION Decision To Discharge 02/11/2018 02:40:39 AM      PATIENT REFERRED TO:  Ellene Route, MD  [address]  [place] [address]  [phone]    In 1 day  For wound re-check      DISCHARGE MEDICATIONS:  New Prescriptions    NAPROXEN (NAPROSYN) 500 MG TABLET    Take 1 tablet by mouth 2 times daily as needed for Pain    SULFAMETHOXAZOLE-TRIMETHOPRIM (BACTRIM DS) 800-160 MG PER TABLET    Take 1 tablet by mouth 2 times daily for 7 days          (Pleasenote:  Portions of this note were completed with a voice recognition program.   Efforts were made toedit the dictations but occasionally words and phrases are mis-transcribed.)    Form v2016.J.5-cn    Basilio Cairo, MD (electronically signed)  Emergency Medicine Provider              San Morelle, MD  02/11/18 [phone]

## 2018-02-19 NOTE — Telephone Encounter (Signed)
Patient seen in [facility] on 02/11/18  Reason:abscess     ED visits in last yr:7   Hosp/ED adm risk %:86     Per ED discharge instructions: Follow up with Ellene RouteNaomi Tyree, MD (Internal Medicine) in 1 day (02/12/2018); For wound re-check     Attempted to contact patient, check status and assist with follow up per discharge instructions with the following results: no answer, per recording voicemail box is full and unable to take messages at this time.     ED letter sent to home address.

## 2018-03-10 ENCOUNTER — Encounter (HOSPITAL_COMMUNITY): Payer: Self-pay | Admitting: Emergency Medicine

## 2018-03-10 ENCOUNTER — Inpatient Hospital Stay (HOSPITAL_COMMUNITY)
Admission: EM | Admit: 2018-03-10 | Discharge: 2018-04-07 | DRG: 659 | Disposition: A | Discharge status: Skilled Nursing Facility | Payer: Medicare Other | Attending: Internal Medicine | Admitting: Internal Medicine

## 2018-03-10 ENCOUNTER — Emergency Department (HOSPITAL_COMMUNITY): Payer: Medicare Other

## 2018-03-10 ENCOUNTER — Other Ambulatory Visit: Payer: Self-pay

## 2018-03-10 DIAGNOSIS — J9601 Acute respiratory failure with hypoxia: Secondary | ICD-10-CM | POA: Diagnosis present

## 2018-03-10 DIAGNOSIS — R634 Abnormal weight loss: Secondary | ICD-10-CM | POA: Diagnosis not present

## 2018-03-10 DIAGNOSIS — J189 Pneumonia, unspecified organism: Secondary | ICD-10-CM

## 2018-03-10 DIAGNOSIS — Y92239 Unspecified place in hospital as the place of occurrence of the external cause: Secondary | ICD-10-CM | POA: Diagnosis present

## 2018-03-10 DIAGNOSIS — E119 Type 2 diabetes mellitus without complications: Secondary | ICD-10-CM | POA: Diagnosis not present

## 2018-03-10 DIAGNOSIS — D696 Thrombocytopenia, unspecified: Secondary | ICD-10-CM | POA: Diagnosis not present

## 2018-03-10 DIAGNOSIS — T8089XA Other complications following infusion, transfusion and therapeutic injection, initial encounter: Secondary | ICD-10-CM | POA: Diagnosis not present

## 2018-03-10 DIAGNOSIS — B964 Proteus (mirabilis) (morganii) as the cause of diseases classified elsewhere: Secondary | ICD-10-CM | POA: Diagnosis not present

## 2018-03-10 DIAGNOSIS — I82409 Acute embolism and thrombosis of unspecified deep veins of unspecified lower extremity: Secondary | ICD-10-CM

## 2018-03-10 DIAGNOSIS — A419 Sepsis, unspecified organism: Secondary | ICD-10-CM | POA: Diagnosis present

## 2018-03-10 DIAGNOSIS — N132 Hydronephrosis with renal and ureteral calculous obstruction: Secondary | ICD-10-CM

## 2018-03-10 DIAGNOSIS — T83511A Infection and inflammatory reaction due to indwelling urethral catheter, initial encounter: Principal | ICD-10-CM | POA: Diagnosis present

## 2018-03-10 DIAGNOSIS — N111 Chronic obstructive pyelonephritis: Secondary | ICD-10-CM | POA: Diagnosis present

## 2018-03-10 DIAGNOSIS — N179 Acute kidney failure, unspecified: Secondary | ICD-10-CM | POA: Diagnosis present

## 2018-03-10 DIAGNOSIS — E869 Volume depletion, unspecified: Secondary | ICD-10-CM | POA: Diagnosis not present

## 2018-03-10 DIAGNOSIS — L97321 Non-pressure chronic ulcer of left ankle limited to breakdown of skin: Secondary | ICD-10-CM

## 2018-03-10 DIAGNOSIS — K8010 Calculus of gallbladder with chronic cholecystitis without obstruction: Secondary | ICD-10-CM | POA: Diagnosis present

## 2018-03-10 DIAGNOSIS — I10 Essential (primary) hypertension: Secondary | ICD-10-CM | POA: Diagnosis present

## 2018-03-10 DIAGNOSIS — N1330 Unspecified hydronephrosis: Secondary | ICD-10-CM | POA: Diagnosis present

## 2018-03-10 DIAGNOSIS — E232 Diabetes insipidus: Secondary | ICD-10-CM | POA: Diagnosis present

## 2018-03-10 DIAGNOSIS — Z982 Presence of cerebrospinal fluid drainage device: Secondary | ICD-10-CM

## 2018-03-10 DIAGNOSIS — I959 Hypotension, unspecified: Secondary | ICD-10-CM | POA: Diagnosis present

## 2018-03-10 DIAGNOSIS — L89323 Pressure ulcer of left buttock, stage 3: Secondary | ICD-10-CM | POA: Clinically undetermined

## 2018-03-10 DIAGNOSIS — L8992 Pressure ulcer of unspecified site, stage 2: Secondary | ICD-10-CM

## 2018-03-10 DIAGNOSIS — J690 Pneumonitis due to inhalation of food and vomit: Secondary | ICD-10-CM | POA: Diagnosis not present

## 2018-03-10 DIAGNOSIS — T17908A Unspecified foreign body in respiratory tract, part unspecified causing other injury, initial encounter: Secondary | ICD-10-CM

## 2018-03-10 DIAGNOSIS — F32A Depression, unspecified: Secondary | ICD-10-CM | POA: Diagnosis present

## 2018-03-10 DIAGNOSIS — L89623 Pressure ulcer of left heel, stage 3: Secondary | ICD-10-CM | POA: Clinically undetermined

## 2018-03-10 DIAGNOSIS — F329 Major depressive disorder, single episode, unspecified: Secondary | ICD-10-CM | POA: Diagnosis present

## 2018-03-10 DIAGNOSIS — Y848 Other medical procedures as the cause of abnormal reaction of the patient, or of later complication, without mention of misadventure at the time of the procedure: Secondary | ICD-10-CM | POA: Diagnosis present

## 2018-03-10 DIAGNOSIS — D649 Anemia, unspecified: Secondary | ICD-10-CM | POA: Diagnosis present

## 2018-03-10 DIAGNOSIS — T80818A Extravasation of other vesicant agent, initial encounter: Secondary | ICD-10-CM | POA: Diagnosis not present

## 2018-03-10 DIAGNOSIS — F411 Generalized anxiety disorder: Secondary | ICD-10-CM | POA: Diagnosis present

## 2018-03-10 DIAGNOSIS — Z515 Encounter for palliative care: Secondary | ICD-10-CM | POA: Diagnosis present

## 2018-03-10 DIAGNOSIS — Z6820 Body mass index (BMI) 20.0-20.9, adult: Secondary | ICD-10-CM

## 2018-03-10 DIAGNOSIS — G9341 Metabolic encephalopathy: Secondary | ICD-10-CM | POA: Diagnosis present

## 2018-03-10 DIAGNOSIS — Z87820 Personal history of traumatic brain injury: Secondary | ICD-10-CM

## 2018-03-10 DIAGNOSIS — F319 Bipolar disorder, unspecified: Secondary | ICD-10-CM | POA: Diagnosis present

## 2018-03-10 DIAGNOSIS — R4182 Altered mental status, unspecified: Secondary | ICD-10-CM

## 2018-03-10 DIAGNOSIS — L89893 Pressure ulcer of other site, stage 3: Secondary | ICD-10-CM | POA: Clinically undetermined

## 2018-03-10 DIAGNOSIS — N10 Acute tubulo-interstitial nephritis: Secondary | ICD-10-CM | POA: Diagnosis present

## 2018-03-10 DIAGNOSIS — R6520 Severe sepsis without septic shock: Secondary | ICD-10-CM | POA: Diagnosis present

## 2018-03-10 DIAGNOSIS — G9340 Encephalopathy, unspecified: Secondary | ICD-10-CM

## 2018-03-10 DIAGNOSIS — R509 Fever, unspecified: Secondary | ICD-10-CM

## 2018-03-10 DIAGNOSIS — I472 Ventricular tachycardia: Secondary | ICD-10-CM | POA: Diagnosis not present

## 2018-03-10 DIAGNOSIS — G8114 Spastic hemiplegia affecting left nondominant side: Secondary | ICD-10-CM | POA: Diagnosis present

## 2018-03-10 DIAGNOSIS — G8110 Spastic hemiplegia affecting unspecified side: Secondary | ICD-10-CM | POA: Diagnosis present

## 2018-03-10 DIAGNOSIS — E039 Hypothyroidism, unspecified: Secondary | ICD-10-CM | POA: Diagnosis present

## 2018-03-10 DIAGNOSIS — T85618A Breakdown (mechanical) of other specified internal prosthetic devices, implants and grafts, initial encounter: Secondary | ICD-10-CM

## 2018-03-10 DIAGNOSIS — N200 Calculus of kidney: Secondary | ICD-10-CM

## 2018-03-10 DIAGNOSIS — R1312 Dysphagia, oropharyngeal phase: Secondary | ICD-10-CM | POA: Diagnosis present

## 2018-03-10 DIAGNOSIS — Z7401 Bed confinement status: Secondary | ICD-10-CM

## 2018-03-10 DIAGNOSIS — L89890 Pressure ulcer of other site, unstageable: Secondary | ICD-10-CM | POA: Diagnosis present

## 2018-03-10 DIAGNOSIS — N135 Crossing vessel and stricture of ureter without hydronephrosis: Secondary | ICD-10-CM | POA: Diagnosis present

## 2018-03-10 DIAGNOSIS — E870 Hyperosmolality and hypernatremia: Secondary | ICD-10-CM

## 2018-03-10 HISTORY — DX: Hypothyroidism, unspecified: E03.9

## 2018-03-10 HISTORY — DX: Depression, unspecified: F32.A

## 2018-03-10 HISTORY — DX: Generalized anxiety disorder: F41.1

## 2018-03-10 HISTORY — DX: Essential (primary) hypertension: I10

## 2018-03-10 HISTORY — DX: Type 2 diabetes mellitus without complications: E11.9

## 2018-03-10 HISTORY — DX: Disorder of thyroid, unspecified: E07.9

## 2018-03-10 HISTORY — DX: Spastic hemiplegia affecting unspecified side: G81.10

## 2018-03-10 HISTORY — DX: Major depressive disorder, single episode, unspecified: F32.9

## 2018-03-10 LAB — COMPREHENSIVE METABOLIC PANEL WITH GFR
ALT: 32 U/L (ref 0–44)
AST: 48 U/L — ABNORMAL HIGH (ref 15–41)
Albumin: 2.3 g/dL — ABNORMAL LOW (ref 3.5–5.0)
Alkaline Phosphatase: 115 U/L (ref 38–126)
Anion gap: 10 (ref 5–15)
BUN: 49 mg/dL — ABNORMAL HIGH (ref 6–20)
CO2: 21 mmol/L — ABNORMAL LOW (ref 22–32)
Calcium: 7.7 mg/dL — ABNORMAL LOW (ref 8.9–10.3)
Chloride: 110 mmol/L (ref 98–111)
Creatinine, Ser: 2.07 mg/dL — ABNORMAL HIGH (ref 0.61–1.24)
GFR calc Af Amer: 41 mL/min — ABNORMAL LOW
GFR calc non Af Amer: 35 mL/min — ABNORMAL LOW
Glucose, Bld: 106 mg/dL — ABNORMAL HIGH (ref 70–99)
Potassium: 4.3 mmol/L (ref 3.5–5.1)
Sodium: 141 mmol/L (ref 135–145)
Total Bilirubin: 1.8 mg/dL — ABNORMAL HIGH (ref 0.3–1.2)
Total Protein: 7.8 g/dL (ref 6.5–8.1)

## 2018-03-10 LAB — URINALYSIS, ROUTINE W REFLEX MICROSCOPIC
Bilirubin Urine: NEGATIVE
Glucose, UA: NEGATIVE mg/dL
Ketones, ur: NEGATIVE mg/dL
Nitrite: NEGATIVE
Protein, ur: 100 mg/dL — AB
RBC / HPF: >50 RBC/hpf — ABNORMAL HIGH (ref 0–5)
Specific Gravity, Urine: 1.023 (ref 1.005–1.030)
pH: 5 (ref 5.0–8.0)

## 2018-03-10 LAB — CBC WITH DIFFERENTIAL/PLATELET
ABS IMMATURE GRANULOCYTES: 0.2 10*3/uL — AB (ref 0.0–0.1)
BASOS ABS: 0 10*3/uL (ref 0.0–0.1)
Basophils Relative: 0 %
Eosinophils Absolute: 0 10*3/uL (ref 0.0–0.7)
Eosinophils Relative: 0 %
HEMATOCRIT: 32.6 % — AB (ref 39.0–52.0)
HEMOGLOBIN: 10 g/dL — AB (ref 13.0–17.0)
Immature Granulocytes: 1 %
LYMPHS ABS: 2.1 10*3/uL (ref 0.7–4.0)
LYMPHS PCT: 14 %
MCH: 26.6 pg (ref 26.0–34.0)
MCHC: 30.7 g/dL (ref 30.0–36.0)
MCV: 86.7 fL (ref 78.0–100.0)
Monocytes Absolute: 2.2 10*3/uL — ABNORMAL HIGH (ref 0.1–1.0)
Monocytes Relative: 14 %
NEUTROS ABS: 11 10*3/uL — AB (ref 1.7–7.7)
Neutrophils Relative %: 71 %
Platelets: 148 10*3/uL — ABNORMAL LOW (ref 150–400)
RBC: 3.76 MIL/uL — AB (ref 4.22–5.81)
RDW: 18.1 % — ABNORMAL HIGH (ref 11.5–15.5)
WBC: 15.5 10*3/uL — AB (ref 4.0–10.5)

## 2018-03-10 LAB — I-STAT CG4 LACTIC ACID, ED
Lactic Acid, Venous: 1.13 mmol/L (ref 0.5–1.9)
Lactic Acid, Venous: 1.17 mmol/L (ref 0.5–1.9)

## 2018-03-10 LAB — I-STAT CHEM 8, ED
BUN: 48 mg/dL — ABNORMAL HIGH (ref 6–20)
Calcium, Ion: 1.01 mmol/L — ABNORMAL LOW (ref 1.15–1.40)
Chloride: 112 mmol/L — ABNORMAL HIGH (ref 98–111)
Creatinine, Ser: 2.1 mg/dL — ABNORMAL HIGH (ref 0.61–1.24)
Glucose, Bld: 111 mg/dL — ABNORMAL HIGH (ref 70–99)
HEMATOCRIT: 32 % — AB (ref 39.0–52.0)
HEMOGLOBIN: 10.9 g/dL — AB (ref 13.0–17.0)
Potassium: 3.6 mmol/L (ref 3.5–5.1)
SODIUM: 147 mmol/L — AB (ref 135–145)
TCO2: 25 mmol/L (ref 22–32)

## 2018-03-10 LAB — I-STAT TROPONIN, ED: TROPONIN I, POC: 0.03 ng/mL (ref 0.00–0.08)

## 2018-03-10 MED ORDER — IPRATROPIUM-ALBUTEROL 0.5-2.5 (3) MG/3ML IN SOLN
3.0000 mL | Freq: Once | RESPIRATORY_TRACT | Status: AC
  Administered 2018-03-10: 3 mL via RESPIRATORY_TRACT
  Filled 2018-03-10: qty 3

## 2018-03-10 MED ORDER — SODIUM CHLORIDE 0.9 % IV BOLUS
1000.0000 mL | Freq: Once | INTRAVENOUS | Status: AC
  Administered 2018-03-10: 1000 mL via INTRAVENOUS

## 2018-03-10 MED ORDER — VANCOMYCIN HCL IN DEXTROSE 1-5 GM/200ML-% IV SOLN
1000.0000 mg | Freq: Once | INTRAVENOUS | Status: AC
  Administered 2018-03-10: 1000 mg via INTRAVENOUS
  Filled 2018-03-10: qty 200

## 2018-03-10 MED ORDER — ACETAMINOPHEN 650 MG RE SUPP
650.0000 mg | Freq: Once | RECTAL | Status: AC
  Administered 2018-03-10: 650 mg via RECTAL
  Filled 2018-03-10: qty 1

## 2018-03-10 MED ORDER — PIPERACILLIN-TAZOBACTAM 3.375 G IVPB
3.3750 g | Freq: Three times a day (TID) | INTRAVENOUS | Status: DC
  Administered 2018-03-11 – 2018-03-14 (×9): 3.375 g via INTRAVENOUS
  Filled 2018-03-10 (×11): qty 50

## 2018-03-10 MED ORDER — PIPERACILLIN-TAZOBACTAM 3.375 G IVPB 30 MIN
3.3750 g | Freq: Once | INTRAVENOUS | Status: AC
  Administered 2018-03-10: 3.375 g via INTRAVENOUS
  Filled 2018-03-10: qty 50

## 2018-03-10 MED ORDER — VANCOMYCIN HCL IN DEXTROSE 1-5 GM/200ML-% IV SOLN
1000.0000 mg | INTRAVENOUS | Status: DC
  Filled 2018-03-10: qty 200

## 2018-03-10 MED ORDER — ACETAMINOPHEN 325 MG PO TABS
650.0000 mg | ORAL_TABLET | Freq: Once | ORAL | Status: DC
  Filled 2018-03-10: qty 2

## 2018-03-10 NOTE — Progress Notes (Signed)
Pharmacy Antibiotic Note  Rodrigo RanBobby Witting is a 52 y.o. male admitted on 03/10/2018 with sepsis.    Plan: Zosyn 3.375 gm iv q8 Vanc 1 g q24h Monitor renal fx cx vt prn  Height: 5\' 9"  (175.3 cm) Weight: 156 lb 15.5 oz (71.2 kg) IBW/kg (Calculated) : 70.7  No data recorded.  Recent Labs  Lab 03/10/18 1946 03/10/18 1958  CREATININE 2.07*  --   LATICACIDVEN  --  1.13    Estimated Creatinine Clearance: 42.2 mL/min (A) (by C-G formula based on SCr of 2.07 mg/dL (H)).    No Known Allergies  Isaac BlissMichael Artis Buechele, PharmD, BCPS, BCCCP Clinical Pharmacist [phone]  Please check AMION for all [facility] numbers  03/10/2018 8:52 PM

## 2018-03-10 NOTE — ED Notes (Addendum)
Cody [name] (brother)- [phone] Cody [name] (brother)- [phone] Cody [name] (father)
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
## 2018-03-10 NOTE — ED Notes (Signed)
EDP Kohut MD at bedside

## 2018-03-10 NOTE — ED Triage Notes (Signed)
[facility]. Facility reports pt had a fever 2 days ago and has been more lethargic than usual. At baseline, pt has garbled speech and is bed bound. Pt found to be hypoxic by EMS with SpO2 in the 70s on room air, given 2 duonebs en route, Spo2 improved to 95% on 4L [place]. EMS vitals: BP 118/70, HR 80.

## 2018-03-11 ENCOUNTER — Inpatient Hospital Stay (HOSPITAL_COMMUNITY): Payer: Medicare Other

## 2018-03-11 ENCOUNTER — Encounter (HOSPITAL_COMMUNITY)
Admission: EM | Disposition: A | Discharge status: Skilled Nursing Facility | Payer: Self-pay | Source: Home / Self Care | Attending: Family Medicine

## 2018-03-11 ENCOUNTER — Encounter (HOSPITAL_COMMUNITY): Payer: Self-pay | Admitting: Internal Medicine

## 2018-03-11 ENCOUNTER — Inpatient Hospital Stay (HOSPITAL_COMMUNITY): Payer: Medicare Other | Admitting: Anesthesiology

## 2018-03-11 ENCOUNTER — Emergency Department (HOSPITAL_COMMUNITY): Payer: Medicare Other

## 2018-03-11 DIAGNOSIS — T8089XA Other complications following infusion, transfusion and therapeutic injection, initial encounter: Secondary | ICD-10-CM | POA: Diagnosis not present

## 2018-03-11 DIAGNOSIS — L89623 Pressure ulcer of left heel, stage 3: Secondary | ICD-10-CM | POA: Diagnosis not present

## 2018-03-11 DIAGNOSIS — Z978 Presence of other specified devices: Secondary | ICD-10-CM | POA: Diagnosis not present

## 2018-03-11 DIAGNOSIS — L89323 Pressure ulcer of left buttock, stage 3: Secondary | ICD-10-CM | POA: Diagnosis not present

## 2018-03-11 DIAGNOSIS — Z936 Other artificial openings of urinary tract status: Secondary | ICD-10-CM | POA: Diagnosis not present

## 2018-03-11 DIAGNOSIS — G8110 Spastic hemiplegia affecting unspecified side: Secondary | ICD-10-CM | POA: Diagnosis not present

## 2018-03-11 DIAGNOSIS — N10 Acute tubulo-interstitial nephritis: Secondary | ICD-10-CM | POA: Diagnosis not present

## 2018-03-11 DIAGNOSIS — I472 Ventricular tachycardia: Secondary | ICD-10-CM | POA: Diagnosis not present

## 2018-03-11 DIAGNOSIS — E232 Diabetes insipidus: Secondary | ICD-10-CM | POA: Diagnosis present

## 2018-03-11 DIAGNOSIS — J690 Pneumonitis due to inhalation of food and vomit: Secondary | ICD-10-CM | POA: Diagnosis not present

## 2018-03-11 DIAGNOSIS — L89893 Pressure ulcer of other site, stage 3: Secondary | ICD-10-CM | POA: Diagnosis not present

## 2018-03-11 DIAGNOSIS — J9601 Acute respiratory failure with hypoxia: Secondary | ICD-10-CM | POA: Diagnosis present

## 2018-03-11 DIAGNOSIS — R0902 Hypoxemia: Secondary | ICD-10-CM | POA: Diagnosis not present

## 2018-03-11 DIAGNOSIS — T83511A Infection and inflammatory reaction due to indwelling urethral catheter, initial encounter: Secondary | ICD-10-CM | POA: Diagnosis present

## 2018-03-11 DIAGNOSIS — Z515 Encounter for palliative care: Secondary | ICD-10-CM | POA: Diagnosis not present

## 2018-03-11 DIAGNOSIS — R6520 Severe sepsis without septic shock: Secondary | ICD-10-CM | POA: Diagnosis present

## 2018-03-11 DIAGNOSIS — I10 Essential (primary) hypertension: Secondary | ICD-10-CM | POA: Diagnosis present

## 2018-03-11 DIAGNOSIS — F32A Depression, unspecified: Secondary | ICD-10-CM | POA: Diagnosis present

## 2018-03-11 DIAGNOSIS — K8010 Calculus of gallbladder with chronic cholecystitis without obstruction: Secondary | ICD-10-CM | POA: Diagnosis present

## 2018-03-11 DIAGNOSIS — L89899 Pressure ulcer of other site, unspecified stage: Secondary | ICD-10-CM | POA: Diagnosis not present

## 2018-03-11 DIAGNOSIS — I959 Hypotension, unspecified: Secondary | ICD-10-CM | POA: Diagnosis present

## 2018-03-11 DIAGNOSIS — N390 Urinary tract infection, site not specified: Secondary | ICD-10-CM | POA: Diagnosis not present

## 2018-03-11 DIAGNOSIS — R509 Fever, unspecified: Secondary | ICD-10-CM | POA: Diagnosis not present

## 2018-03-11 DIAGNOSIS — E870 Hyperosmolality and hypernatremia: Secondary | ICD-10-CM | POA: Diagnosis not present

## 2018-03-11 DIAGNOSIS — E039 Hypothyroidism, unspecified: Secondary | ICD-10-CM | POA: Diagnosis not present

## 2018-03-11 DIAGNOSIS — R918 Other nonspecific abnormal finding of lung field: Secondary | ICD-10-CM | POA: Diagnosis not present

## 2018-03-11 DIAGNOSIS — G9341 Metabolic encephalopathy: Secondary | ICD-10-CM | POA: Diagnosis present

## 2018-03-11 DIAGNOSIS — R10819 Abdominal tenderness, unspecified site: Secondary | ICD-10-CM | POA: Insufficient documentation

## 2018-03-11 DIAGNOSIS — E119 Type 2 diabetes mellitus without complications: Secondary | ICD-10-CM

## 2018-03-11 DIAGNOSIS — T17908A Unspecified foreign body in respiratory tract, part unspecified causing other injury, initial encounter: Secondary | ICD-10-CM | POA: Diagnosis not present

## 2018-03-11 DIAGNOSIS — N111 Chronic obstructive pyelonephritis: Secondary | ICD-10-CM | POA: Diagnosis present

## 2018-03-11 DIAGNOSIS — N200 Calculus of kidney: Secondary | ICD-10-CM | POA: Diagnosis not present

## 2018-03-11 DIAGNOSIS — B957 Other staphylococcus as the cause of diseases classified elsewhere: Secondary | ICD-10-CM | POA: Diagnosis not present

## 2018-03-11 DIAGNOSIS — Y92239 Unspecified place in hospital as the place of occurrence of the external cause: Secondary | ICD-10-CM | POA: Diagnosis present

## 2018-03-11 DIAGNOSIS — N179 Acute kidney failure, unspecified: Secondary | ICD-10-CM | POA: Diagnosis present

## 2018-03-11 DIAGNOSIS — K8020 Calculus of gallbladder without cholecystitis without obstruction: Secondary | ICD-10-CM | POA: Diagnosis not present

## 2018-03-11 DIAGNOSIS — T80818A Extravasation of other vesicant agent, initial encounter: Secondary | ICD-10-CM | POA: Diagnosis not present

## 2018-03-11 DIAGNOSIS — N135 Crossing vessel and stricture of ureter without hydronephrosis: Secondary | ICD-10-CM | POA: Diagnosis present

## 2018-03-11 DIAGNOSIS — Z1624 Resistance to multiple antibiotics: Secondary | ICD-10-CM | POA: Diagnosis not present

## 2018-03-11 DIAGNOSIS — D649 Anemia, unspecified: Secondary | ICD-10-CM | POA: Diagnosis present

## 2018-03-11 DIAGNOSIS — G8114 Spastic hemiplegia affecting left nondominant side: Secondary | ICD-10-CM | POA: Diagnosis present

## 2018-03-11 DIAGNOSIS — F329 Major depressive disorder, single episode, unspecified: Secondary | ICD-10-CM | POA: Diagnosis present

## 2018-03-11 DIAGNOSIS — A419 Sepsis, unspecified organism: Secondary | ICD-10-CM | POA: Diagnosis present

## 2018-03-11 DIAGNOSIS — L89890 Pressure ulcer of other site, unstageable: Secondary | ICD-10-CM | POA: Diagnosis present

## 2018-03-11 DIAGNOSIS — N131 Hydronephrosis with ureteral stricture, not elsewhere classified: Secondary | ICD-10-CM | POA: Diagnosis not present

## 2018-03-11 DIAGNOSIS — R4182 Altered mental status, unspecified: Secondary | ICD-10-CM | POA: Diagnosis not present

## 2018-03-11 DIAGNOSIS — M79609 Pain in unspecified limb: Secondary | ICD-10-CM | POA: Diagnosis not present

## 2018-03-11 DIAGNOSIS — L89629 Pressure ulcer of left heel, unspecified stage: Secondary | ICD-10-CM | POA: Diagnosis not present

## 2018-03-11 DIAGNOSIS — B964 Proteus (mirabilis) (morganii) as the cause of diseases classified elsewhere: Secondary | ICD-10-CM | POA: Diagnosis not present

## 2018-03-11 DIAGNOSIS — Z1611 Resistance to penicillins: Secondary | ICD-10-CM | POA: Diagnosis not present

## 2018-03-11 DIAGNOSIS — Y848 Other medical procedures as the cause of abnormal reaction of the patient, or of later complication, without mention of misadventure at the time of the procedure: Secondary | ICD-10-CM | POA: Diagnosis present

## 2018-03-11 DIAGNOSIS — F809 Developmental disorder of speech and language, unspecified: Secondary | ICD-10-CM | POA: Diagnosis not present

## 2018-03-11 DIAGNOSIS — N1330 Unspecified hydronephrosis: Secondary | ICD-10-CM | POA: Diagnosis present

## 2018-03-11 HISTORY — PX: CYSTOSCOPY W/ URETERAL STENT PLACEMENT: SHX1429

## 2018-03-11 LAB — POCT I-STAT 3, ART BLOOD GAS (G3+)
Acid-base deficit: 4 mmol/L — ABNORMAL HIGH (ref 0.0–2.0)
BICARBONATE: 21.6 mmol/L (ref 20.0–28.0)
O2 SAT: 93 %
TCO2: 23 mmol/L (ref 22–32)
pCO2 arterial: 44.8 mmHg (ref 32.0–48.0)
pH, Arterial: 7.297 — ABNORMAL LOW (ref 7.350–7.450)
pO2, Arterial: 78 mmHg — ABNORMAL LOW (ref 83.0–108.0)

## 2018-03-11 LAB — BASIC METABOLIC PANEL
Anion gap: 11 (ref 5–15)
BUN: 32 mg/dL — ABNORMAL HIGH (ref 6–20)
CO2: 18 mmol/L — ABNORMAL LOW (ref 22–32)
CREATININE: 1.58 mg/dL — AB (ref 0.61–1.24)
Calcium: 7.5 mg/dL — ABNORMAL LOW (ref 8.9–10.3)
Chloride: 117 mmol/L — ABNORMAL HIGH (ref 98–111)
GFR calc Af Amer: 57 mL/min — ABNORMAL LOW (ref >60)
GFR calc non Af Amer: 49 mL/min — ABNORMAL LOW (ref >60)
GLUCOSE: 102 mg/dL — AB (ref 70–99)
Potassium: 3.7 mmol/L (ref 3.5–5.1)
Sodium: 146 mmol/L — ABNORMAL HIGH (ref 135–145)

## 2018-03-11 LAB — PROTIME-INR
INR: 1.41
PROTHROMBIN TIME: 17.1 s — AB (ref 11.4–15.2)

## 2018-03-11 LAB — TYPE AND SCREEN
ABO/RH(D): O POS
Antibody Screen: NEGATIVE

## 2018-03-11 LAB — CBC
HCT: 35.9 % — ABNORMAL LOW (ref 39.0–52.0)
Hemoglobin: 10.6 g/dL — ABNORMAL LOW (ref 13.0–17.0)
MCH: 26.3 pg (ref 26.0–34.0)
MCHC: 29.5 g/dL — ABNORMAL LOW (ref 30.0–36.0)
MCV: 89.1 fL (ref 78.0–100.0)
PLATELETS: 136 10*3/uL — AB (ref 150–400)
RBC: 4.03 MIL/uL — ABNORMAL LOW (ref 4.22–5.81)
RDW: 18.2 % — AB (ref 11.5–15.5)
WBC: 19.7 10*3/uL — AB (ref 4.0–10.5)

## 2018-03-11 LAB — GLUCOSE, CAPILLARY
GLUCOSE-CAPILLARY: 87 mg/dL (ref 70–99)
GLUCOSE-CAPILLARY: 94 mg/dL (ref 70–99)
GLUCOSE-CAPILLARY: 118 mg/dL — AB (ref 70–99)
GLUCOSE-CAPILLARY: 112 mg/dL — AB (ref 70–99)
Glucose-Capillary: 119 mg/dL — ABNORMAL HIGH (ref 70–99)

## 2018-03-11 LAB — PROCALCITONIN: PROCALCITONIN: 3.8 ng/mL

## 2018-03-11 LAB — HIV ANTIBODY (ROUTINE TESTING W REFLEX): HIV SCREEN 4TH GENERATION: NONREACTIVE

## 2018-03-11 LAB — MRSA PCR SCREENING: MRSA by PCR: NEGATIVE

## 2018-03-11 LAB — LACTIC ACID, PLASMA: LACTIC ACID, VENOUS: 1.6 mmol/L (ref 0.5–1.9)

## 2018-03-11 LAB — APTT: APTT: 35 s (ref 24–36)

## 2018-03-11 LAB — ABO/RH: ABO/RH(D): O POS

## 2018-03-11 SURGERY — CYSTOSCOPY, WITH RETROGRADE PYELOGRAM AND URETERAL STENT INSERTION
Anesthesia: General | Site: Penis | Laterality: Right

## 2018-03-11 MED ORDER — ORAL CARE MOUTH RINSE
15.0000 mL | Freq: Two times a day (BID) | OROMUCOSAL | Status: DC
  Administered 2018-03-11 – 2018-04-07 (×50): 15 mL via OROMUCOSAL

## 2018-03-11 MED ORDER — SODIUM CHLORIDE 0.9 % IV SOLN
INTRAVENOUS | Status: DC
  Administered 2018-03-11: 12:00:00 via INTRAVENOUS

## 2018-03-11 MED ORDER — PROMETHAZINE HCL 25 MG/ML IJ SOLN: 6.2500 mg | INTRAMUSCULAR | Status: DC | PRN

## 2018-03-11 MED ORDER — PHENYLEPHRINE HCL 10 MG/ML IJ SOLN
INTRAMUSCULAR | Status: DC | PRN
  Administered 2018-03-11 (×2): 80 ug via INTRAVENOUS

## 2018-03-11 MED ORDER — LIDOCAINE HCL 1 % IJ SOLN
INTRAMUSCULAR | Status: DC | PRN
  Administered 2018-03-11: 50 mg via INTRADERMAL

## 2018-03-11 MED ORDER — QUETIAPINE FUMARATE 50 MG PO TABS
125.0000 mg | ORAL_TABLET | Freq: Every day | ORAL | Status: DC
  Administered 2018-03-12 – 2018-04-05 (×25): 125 mg via ORAL
  Filled 2018-03-11 (×28): qty 1

## 2018-03-11 MED ORDER — PROPOFOL 10 MG/ML IV BOLUS
INTRAVENOUS | Status: DC | PRN
  Administered 2018-03-11: 80 mg via INTRAVENOUS

## 2018-03-11 MED ORDER — DIVALPROEX SODIUM 125 MG PO CSDR
500.0000 mg | DELAYED_RELEASE_CAPSULE | Freq: Three times a day (TID) | ORAL | Status: DC
  Administered 2018-03-11 – 2018-04-07 (×80): 500 mg via ORAL
  Filled 2018-03-11 (×86): qty 4

## 2018-03-11 MED ORDER — ONDANSETRON HCL 4 MG/2ML IJ SOLN
INTRAMUSCULAR | Status: DC | PRN
  Administered 2018-03-11: 4 mg via INTRAVENOUS

## 2018-03-11 MED ORDER — SERTRALINE HCL 100 MG PO TABS
100.0000 mg | ORAL_TABLET | Freq: Every day | ORAL | Status: DC
  Administered 2018-03-11 – 2018-04-07 (×27): 100 mg via ORAL
  Filled 2018-03-11 (×27): qty 1

## 2018-03-11 MED ORDER — ACETAMINOPHEN 325 MG PO TABS
650.0000 mg | ORAL_TABLET | Freq: Four times a day (QID) | ORAL | Status: DC | PRN
  Administered 2018-03-11 – 2018-03-29 (×19): 650 mg via ORAL
  Filled 2018-03-11 (×22): qty 2

## 2018-03-11 MED ORDER — BACLOFEN 10 MG PO TABS
10.0000 mg | ORAL_TABLET | Freq: Three times a day (TID) | ORAL | Status: DC
  Administered 2018-03-11 – 2018-04-07 (×80): 10 mg via ORAL
  Filled 2018-03-11 (×82): qty 1

## 2018-03-11 MED ORDER — SODIUM CHLORIDE 0.9 % IV SOLN
Freq: Once | INTRAVENOUS | Status: AC
  Administered 2018-03-11: 01:00:00 via INTRAVENOUS

## 2018-03-11 MED ORDER — QUETIAPINE FUMARATE 25 MG PO TABS: 25.0000 mg | ORAL_TABLET | Freq: Every day | ORAL | Status: DC

## 2018-03-11 MED ORDER — METOPROLOL TARTRATE 50 MG PO TABS
50.0000 mg | ORAL_TABLET | Freq: Every day | ORAL | Status: DC
  Administered 2018-03-12 – 2018-04-05 (×25): 50 mg via ORAL
  Filled 2018-03-11 (×26): qty 1

## 2018-03-11 MED ORDER — FENTANYL CITRATE (PF) 250 MCG/5ML IJ SOLN
INTRAMUSCULAR | Status: AC
  Filled 2018-03-11: qty 5

## 2018-03-11 MED ORDER — SODIUM CHLORIDE 0.9 % IV SOLN
INTRAVENOUS | Status: DC
  Administered 2018-03-11: 14:00:00 via INTRAVENOUS

## 2018-03-11 MED ORDER — SODIUM CHLORIDE 0.9 % IV BOLUS
500.0000 mL | Freq: Once | INTRAVENOUS | Status: AC
  Administered 2018-03-11: 500 mL via INTRAVENOUS

## 2018-03-11 MED ORDER — INSULIN ASPART 100 UNIT/ML ~~LOC~~ SOLN
0.0000 [IU] | Freq: Three times a day (TID) | SUBCUTANEOUS | Status: DC
  Administered 2018-03-17 – 2018-03-29 (×14): 1 [IU] via SUBCUTANEOUS

## 2018-03-11 MED ORDER — ALBUTEROL SULFATE (2.5 MG/3ML) 0.083% IN NEBU: 2.5000 mg | INHALATION_SOLUTION | RESPIRATORY_TRACT | Status: DC | PRN

## 2018-03-11 MED ORDER — QUETIAPINE FUMARATE 50 MG PO TABS
150.0000 mg | ORAL_TABLET | Freq: Every day | ORAL | Status: DC
  Administered 2018-03-11 – 2018-04-07 (×27): 150 mg via ORAL
  Filled 2018-03-11 (×29): qty 3

## 2018-03-11 MED ORDER — LACTATED RINGERS IV SOLN
INTRAVENOUS | Status: DC | PRN
  Administered 2018-03-11: 03:00:00 via INTRAVENOUS

## 2018-03-11 MED ORDER — SUCCINYLCHOLINE CHLORIDE 200 MG/10ML IV SOSY
PREFILLED_SYRINGE | INTRAVENOUS | Status: AC
  Filled 2018-03-11: qty 10

## 2018-03-11 MED ORDER — POLYETHYLENE GLYCOL 3350 17 G PO PACK
17.0000 g | PACK | Freq: Every day | ORAL | Status: DC
  Administered 2018-03-11 – 2018-04-01 (×11): 17 g via ORAL
  Filled 2018-03-11 (×18): qty 1

## 2018-03-11 MED ORDER — PRO-STAT SUGAR FREE PO LIQD
30.0000 mL | Freq: Every day | ORAL | Status: DC
  Administered 2018-03-11 – 2018-03-22 (×11): 30 mL via ORAL
  Filled 2018-03-11 (×11): qty 30

## 2018-03-11 MED ORDER — VANCOMYCIN HCL IN DEXTROSE 750-5 MG/150ML-% IV SOLN
750.0000 mg | Freq: Two times a day (BID) | INTRAVENOUS | Status: DC
  Administered 2018-03-11 – 2018-03-13 (×5): 750 mg via INTRAVENOUS
  Filled 2018-03-11 (×5): qty 150

## 2018-03-11 MED ORDER — IPRATROPIUM-ALBUTEROL 0.5-2.5 (3) MG/3ML IN SOLN
3.0000 mL | Freq: Four times a day (QID) | RESPIRATORY_TRACT | Status: DC
  Administered 2018-03-11: 3 mL via RESPIRATORY_TRACT
  Filled 2018-03-11: qty 3

## 2018-03-11 MED ORDER — METOPROLOL TARTRATE 50 MG PO TABS
75.0000 mg | ORAL_TABLET | Freq: Every day | ORAL | Status: DC
  Administered 2018-03-11 – 2018-04-07 (×28): 75 mg via ORAL
  Filled 2018-03-11 (×28): qty 1

## 2018-03-11 MED ORDER — PHENYLEPH-SHARK LIV OIL-MO-PET 0.25-3-14-71.9 % RE OINT: 1.0000 "application " | TOPICAL_OINTMENT | Freq: Three times a day (TID) | RECTAL | Status: DC | PRN

## 2018-03-11 MED ORDER — FENTANYL CITRATE (PF) 100 MCG/2ML IJ SOLN: 25.0000 ug | INTRAMUSCULAR | Status: DC | PRN

## 2018-03-11 MED ORDER — DM-GUAIFENESIN ER 30-600 MG PO TB12
1.0000 | ORAL_TABLET | Freq: Two times a day (BID) | ORAL | Status: DC | PRN
  Administered 2018-03-17 – 2018-03-24 (×3): 1 via ORAL
  Filled 2018-03-11 (×3): qty 1

## 2018-03-11 MED ORDER — SODIUM CHLORIDE 0.9 % IV SOLN
INTRAVENOUS | Status: DC | PRN
  Administered 2018-03-11: 03:00:00 via INTRAVENOUS

## 2018-03-11 MED ORDER — INSULIN DETEMIR 100 UNIT/ML ~~LOC~~ SOLN
10.0000 [IU] | Freq: Every day | SUBCUTANEOUS | Status: DC
  Administered 2018-03-11 – 2018-04-07 (×28): 10 [IU] via SUBCUTANEOUS
  Filled 2018-03-11 (×29): qty 0.1

## 2018-03-11 MED ORDER — SUCCINYLCHOLINE CHLORIDE 20 MG/ML IJ SOLN
INTRAMUSCULAR | Status: DC | PRN
  Administered 2018-03-11: 90 mg via INTRAVENOUS

## 2018-03-11 MED ORDER — CLONAZEPAM 0.5 MG PO TABS
0.5000 mg | ORAL_TABLET | ORAL | Status: DC
  Administered 2018-03-11 – 2018-04-06 (×21): 0.5 mg via ORAL
  Filled 2018-03-11 (×21): qty 1

## 2018-03-11 MED ORDER — IOPAMIDOL (ISOVUE-300) INJECTION 61%
INTRAVENOUS | Status: AC
  Filled 2018-03-11: qty 50

## 2018-03-11 MED ORDER — SELENIUM SULFIDE 1 % EX LOTN
1.0000 "application " | TOPICAL_LOTION | CUTANEOUS | Status: DC
  Administered 2018-03-11 – 2018-04-06 (×9): 1 via TOPICAL
  Filled 2018-03-11 (×2): qty 207

## 2018-03-11 MED ORDER — PROPOFOL 10 MG/ML IV BOLUS
INTRAVENOUS | Status: AC
  Filled 2018-03-11: qty 20

## 2018-03-11 MED ORDER — SODIUM CHLORIDE 0.9 % IV SOLN
INTRAVENOUS | Status: DC
  Administered 2018-03-11 – 2018-03-12 (×3): via INTRAVENOUS

## 2018-03-11 MED ORDER — STERILE WATER FOR IRRIGATION IR SOLN
Status: DC | PRN
  Administered 2018-03-11: 3000 mL

## 2018-03-11 MED ORDER — LACTATED RINGERS IV SOLN: INTRAVENOUS | Status: DC

## 2018-03-11 MED ORDER — METOPROLOL TARTRATE 5 MG/5ML IV SOLN
2.5000 mg | Freq: Four times a day (QID) | INTRAVENOUS | Status: DC
  Administered 2018-03-11 – 2018-03-26 (×55): 2.5 mg via INTRAVENOUS
  Filled 2018-03-11 (×51): qty 5

## 2018-03-11 MED ORDER — SODIUM CHLORIDE 0.9 % IV BOLUS
500.0000 mL | Freq: Once | INTRAVENOUS | Status: AC
  Administered 2018-03-12: 500 mL via INTRAVENOUS

## 2018-03-11 MED ORDER — FENTANYL CITRATE (PF) 100 MCG/2ML IJ SOLN
INTRAMUSCULAR | Status: DC | PRN
  Administered 2018-03-11 (×2): 50 ug via INTRAVENOUS

## 2018-03-11 MED ORDER — ADULT MULTIVITAMIN W/MINERALS CH
1.0000 | ORAL_TABLET | Freq: Every day | ORAL | Status: DC
  Administered 2018-03-11 – 2018-04-07 (×28): 1 via ORAL
  Filled 2018-03-11 (×28): qty 1

## 2018-03-11 MED ORDER — CLONAZEPAM 0.5 MG PO TABS
1.0000 mg | ORAL_TABLET | Freq: Two times a day (BID) | ORAL | Status: DC
  Administered 2018-03-11 – 2018-04-07 (×50): 1 mg via ORAL
  Filled 2018-03-11: qty 1
  Filled 2018-03-11 (×29): qty 2
  Filled 2018-03-11: qty 1
  Filled 2018-03-11 (×22): qty 2

## 2018-03-11 MED ORDER — PHENYLEPHRINE 40 MCG/ML (10ML) SYRINGE FOR IV PUSH (FOR BLOOD PRESSURE SUPPORT)
PREFILLED_SYRINGE | INTRAVENOUS | Status: AC
  Filled 2018-03-11: qty 10

## 2018-03-11 MED ORDER — LIDOCAINE 2% (20 MG/ML) 5 ML SYRINGE
INTRAMUSCULAR | Status: AC
  Filled 2018-03-11: qty 5

## 2018-03-11 MED ORDER — IOPAMIDOL (ISOVUE-300) INJECTION 61%
INTRAVENOUS | Status: DC | PRN
  Administered 2018-03-11: 4 mL via URETHRAL

## 2018-03-11 MED ORDER — ENOXAPARIN SODIUM 40 MG/0.4ML ~~LOC~~ SOLN: 40.0000 mg | SUBCUTANEOUS | Status: DC

## 2018-03-11 SURGICAL SUPPLY — 29 items
ADAPTER CATH URET PLST 4-6FR (CATHETERS) IMPLANT
BAG URINE DRAINAGE (UROLOGICAL SUPPLIES) ×3 IMPLANT
BAG URO CATCHER STRL LF (MISCELLANEOUS) ×3 IMPLANT
BENZOIN TINCTURE PRP APPL 2/3 (GAUZE/BANDAGES/DRESSINGS) IMPLANT
BLADE 10 SAFETY STRL DISP (BLADE) ×3 IMPLANT
BNDG GAUZE ELAST 4 BULKY (GAUZE/BANDAGES/DRESSINGS) ×3 IMPLANT
BUCKET BIOHAZARD WASTE 5 GAL (MISCELLANEOUS) ×3 IMPLANT
CATH FOLEY 2WAY SLVR  5CC 16FR (CATHETERS) ×2
CATH FOLEY 2WAY SLVR 5CC 16FR (CATHETERS) ×1 IMPLANT
CATH INTERMIT  6FR 70CM (CATHETERS) ×3 IMPLANT
CATH URET 5FR 28IN CONE TIP (BALLOONS)
CATH URET 5FR 70CM CONE TIP (BALLOONS) IMPLANT
DRAPE CAMERA CLOSED 9X96 (DRAPES) IMPLANT
GOWN STRL REUS W/ TWL LRG LVL3 (GOWN DISPOSABLE) ×1 IMPLANT
GOWN STRL REUS W/ TWL XL LVL3 (GOWN DISPOSABLE) ×1 IMPLANT
GOWN STRL REUS W/TWL LRG LVL3 (GOWN DISPOSABLE) ×2
GOWN STRL REUS W/TWL XL LVL3 (GOWN DISPOSABLE) ×2
GUIDEWIRE COOK  .035 (WIRE) IMPLANT
KIT TURNOVER KIT B (KITS) ×3 IMPLANT
NS IRRIG 1000ML POUR BTL (IV SOLUTION) ×3 IMPLANT
PACK CYSTO (CUSTOM PROCEDURE TRAY) ×3 IMPLANT
PAD ARMBOARD 7.5X6 YLW CONV (MISCELLANEOUS) ×6 IMPLANT
PLUG CATH AND CAP STER (CATHETERS) IMPLANT
STENT URET 6FRX24 CONTOUR (STENTS) ×3 IMPLANT
SYRINGE CONTROL L 12CC (SYRINGE) ×3 IMPLANT
SYRINGE TOOMEY DISP (SYRINGE) ×3 IMPLANT
UNDERPAD 30X30 (UNDERPADS AND DIAPERS) ×3 IMPLANT
WATER STERILE IRR 1000ML POUR (IV SOLUTION) ×3 IMPLANT
WIRE COONS/BENSON .038X145CM (WIRE) IMPLANT
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
## 2018-03-11 NOTE — Progress Notes (Signed)
Tylenol given

## 2018-03-11 NOTE — Anesthesia Postprocedure Evaluation (Signed)
Anesthesia Post Note  Patient: Rodrigo RanBobby Ricketts  Procedure(s) Performed: CYSTOSCOPY WITH RETROGRADE PYELOGRAM/DOUBLE J STENT PLACEMENT (Right Penis)     Patient location during evaluation: PACU Anesthesia Type: General Level of consciousness: awake and alert Pain management: pain level controlled Vital Signs Assessment: post-procedure vital signs reviewed and stable Respiratory status: spontaneous breathing, nonlabored ventilation, respiratory function stable and patient connected to nasal cannula oxygen Cardiovascular status: blood pressure returned to baseline and stable Postop Assessment: no apparent nausea or vomiting Anesthetic complications: no    Last Vitals:  Vitals:   03/11/18 0600 03/11/18 0630  BP: 111/63 91/68  Pulse: (!) 117 (!) 122  Resp: 18 16  Temp:    SpO2: 98% 95%    Last Pain:  Vitals:   03/10/18 2130  TempSrc: Rectal                 Shelton SilvasKevin D Haroon Shatto

## 2018-03-11 NOTE — Anesthesia Procedure Notes (Signed)
Procedure Name: Intubation Date/Time: 03/11/2018 2:38 AM Performed by: Molli HazardGordon, Shayann Garbutt M, CRNA Pre-anesthesia Checklist: Patient identified, Emergency Drugs available, Suction available and Patient being monitored Patient Re-evaluated:Patient Re-evaluated prior to induction Oxygen Delivery Method: Circle System Utilized Preoxygenation: Pre-oxygenation with 100% oxygen Induction Type: IV induction Ventilation: Mask ventilation without difficulty Laryngoscope Size: Miller and 2 Grade View: Grade II Tube type: Oral Tube size: 7.5 mm Number of attempts: 1 Airway Equipment and Method: Stylet and Oral airway Placement Confirmation: ETT inserted through vocal cords under direct vision,  positive ETCO2 and breath sounds checked- equal and bilateral Secured at: 22 cm Tube secured with: Tape Dental Injury: Teeth and Oropharynx as per pre-operative assessment

## 2018-03-11 NOTE — Anesthesia Preprocedure Evaluation (Addendum)
Anesthesia Evaluation  Patient identified by MRN, date of birth, ID band Patient unresponsive    Reviewed: Allergy & Precautions, NPO status , Patient's Chart, lab work & pertinent test results, Unable to perform ROS - Chart review onlyPreop documentation limited or incomplete due to emergent nature of procedure.  Airway   TM Distance: <3 FB    Comment: Patient unresponsive so unable to do Mallampati exam. Teeth intact. Dental  (+) Teeth Intact   Pulmonary     + decreased breath sounds      Cardiovascular Pt. on medications and Pt. on home beta blockers  Rhythm:Regular Rate:Normal     Neuro/Psych    GI/Hepatic   Endo/Other  diabetes, Insulin Dependent  Renal/GU      Musculoskeletal   Abdominal   Peds  Hematology   Anesthesia Other Findings   Reproductive/Obstetrics                            Lab Results  Component Value Date   WBC 15.5 (H) 03/10/2018   HGB 10.9 (L) 03/10/2018   HCT 32.0 (L) 03/10/2018   MCV 86.7 03/10/2018   PLT 148 (L) 03/10/2018   Lab Results  Component Value Date   CREATININE 2.10 (H) 03/10/2018   BUN 48 (H) 03/10/2018   NA 147 (H) 03/10/2018   K 3.6 03/10/2018   CL 112 (H) 03/10/2018   CO2 21 (L) 03/10/2018   Lab Results  Component Value Date   INR 1.41 03/10/2018   INR 1.07 11/21/2009   INR 1.6 (H) 10/14/2008     Anesthesia Physical Anesthesia Plan  ASA: III and emergent  Anesthesia Plan: General   Post-op Pain Management:    Induction: Intravenous, Rapid sequence and Cricoid pressure planned  PONV Risk Score and Plan: 3 and Ondansetron and Treatment may vary due to age or medical condition  Airway Management Planned: Oral ETT  Additional Equipment: None  Intra-op Plan:   Post-operative Plan: Extubation in OR  Informed Consent: I have reviewed the patients History and Physical, chart, labs and discussed the procedure including the risks,  benefits and alternatives for the proposed anesthesia with the patient or authorized representative who has indicated his/her understanding and acceptance.     Plan Discussed with: CRNA  Anesthesia Plan Comments: (Unable to discuss anesthetic plan with POA due to her being on a flight per RN. )       Anesthesia Quick Evaluation

## 2018-03-11 NOTE — Transfer of Care (Signed)
Immediate Anesthesia Transfer of Care Note  Patient: Cody [name]  Procedure(s) Performed: CYSTOSCOPY WITH RETROGRADE PYELOGRAM/DOUBLE J STENT PLACEMENT (Right Penis)  Patient Location: PACU  Anesthesia Type:General  Level of Consciousness: unresponsive  Airway & Oxygen Therapy: Patient connected to nasal cannula oxygen  Post-op Assessment: Report given to RN and Post -op Vital signs reviewed and stable  Post vital signs: Reviewed and stable  Last Vitals:  Vitals Value Taken Time  BP 113/61 03/11/2018  3:56 AM  Temp    Pulse 116 03/11/2018  4:02 AM  Resp 29 03/11/2018  4:02 AM  SpO2 95 % 03/11/2018  4:02 AM  Vitals shown include unvalidated device data.  Last Pain:  Vitals:   03/10/18 2130  TempSrc: Rectal         Complications: No apparent anesthesia complications

## 2018-03-11 NOTE — Progress Notes (Signed)
PROGRESS NOTE    Cody [name]  ZOX:[id number] DOB: March 14, 1966 DOA: 03/10/2018 PCP: [name], MD   Brief Narrative:  HPI On 03/11/2018 by Dr. Lorretta Harp Talib [name] is a 52 y.o. 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  Per report, pt has garbled speech and is bed bound at baseline. Pt is noted to be more confused, very lethargic and less responsive in facililty in the past 2 days. He has fever and right sided abdominal tenderness. No nausea, vomiting, diarrhea notated. Per his sister, pt seems to have dysuria. Pt was reportedly to have dry cough. He was found to be hypoxic by EMS with SpO2 in the 70s on room air, given 2 duonebs en route, Spo2 improved to 95% on 4L [place]. Not sure if he has any chest pain. He has left sided spasmatic hemiplegia. No facial droop. He moves his right extremities. He is initially hypotensive with SBP 70s, which improved to SBP>100 after 3L NS bolus in ED.  Assessment & Plan   Severe sepsis secondary to acute pyelonephrosis secondary to obstructive uropathy -Presented with tachycardia, fever, leukocytosis, AKI and hypotension.  Hypotension has been responsive to IV fluids. -CT abdomen/pelvis showed right obstructive uropathy with 7 x 5 mm right ureterovesicular junction stone causing mild hydroureteronephrosis and perinephric stranding.  Multiple nonobstructing right renal calculi measuring up to 10 mm. -Blood cultures show no growth to date -Urine culture pending -Continue vancomycin and Zosyn -Urology consulted and appreciated, status post cystoscopy with right retrograde ureteropyelogram, fluoroscopic interpretation, placement of 6 French by 24 similar contour double-J stent without tether  Acute kidney injury secondary to  obstructive uropathy and hydroureteronephrosis -Creatinine on admission was 2.07, has improved to 1.58 -Baseline creatinine 0.8 in 2011 -Treatment plan as above  Acute metabolic encephalopathy -Suspect secondary to sepsis -CT head unremarkable for acute intercranial normalities, however showed slight interval enlargement of the ventricular system as compared with 2009 CT, it is unclear if this is secondary to progression of atrophy and volume loss versus some degree of shunt malfunction -Will discuss with neurosurgery  Acute respiratory failure with hypoxia -Etiology unclear -Chest x-ray unremarkable -Currently on antibiotics as above -Continue nebulizer treatments and supplemental oxygen  Normocytic anemia -Hemoglobin currently 10, no recent previous labs for comparison.  Hemoglobin was 16 back in 2011. 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   Consultants Urology   Procedures  Cystoscopy with right retrograde ureteropyelogram, fluoroscopic interpretation, placement of 6 French by 24 similar contour double-J stent without tether  Antibiotics   Anti-infectives (From admission, onward)   Start     Dose/Rate Route Frequency Ordered Stop   03/11/18 2000  vancomycin (VANCOCIN) IVPB 1000 mg/200 mL premix  Status:  Discontinued     1,000 mg 200 mL/hr over 60 Minutes Intravenous Every 24 hours 03/10/18 2053 03/11/18 1342   03/11/18 1400  vancomycin (VANCOCIN) IVPB 750 mg/150 ml premix     750 mg 150 mL/hr over 60 Minutes Intravenous Every 12  hours  03/11/18 1342     03/11/18 0400  piperacillin-tazobactam (ZOSYN) IVPB 3.375 g     3.375 g 12.5 mL/hr over 240 Minutes Intravenous Every 8 hours 03/10/18 2053     03/10/18 2030  piperacillin-tazobactam (ZOSYN) IVPB 3.375 g     3.375 g 100 mL/hr over 30 Minutes Intravenous  Once 03/10/18 2023 03/10/18 2139   03/10/18 2030  vancomycin (VANCOCIN) IVPB 1000 mg/200 mL premix     1,000 mg 200 mL/hr over 60 Minutes Intravenous  Once 03/10/18 2023 03/10/18 2208      Subjective:   Rodrigo RanBobby Macdonnell seen and examined today.  Patient with history of traumatic brain injury.  Unknown baseline.  Currently has no complaints and states he is feeling better.  Objective:   Vitals:   03/11/18 1135 03/11/18 1200 03/11/18 1230 03/11/18 1300  BP:  (!) 108/54 123/78 127/61  Pulse:  (!) 123 (!) 123 (!) 124  Resp:  (!) 28 (!) 27 (!) 22  Temp: (!) 100.9 F (38.3 C)     TempSrc: Oral     SpO2:  98% 99% 100%  Weight:      Height:        Intake/Output Summary (Last 24 hours) at 03/11/2018 1408 Last data filed at 03/11/2018 1300 Gross per 24 hour  Intake 6288.14 ml  Output 1250 ml  Net 5038.14 ml   Filed Weights   03/10/18 2023 03/11/18 0515  Weight: 71.2 kg (156 lb 15.5 oz) 71.8 kg (158 lb 4.6 oz)    Exam  General: Well developed, chronically ill-appearing, no apparent distress  HEENT: NCAT, mucous membranes moist.   Neck: Suppl  Cardiovascular: S1 S2 auscultated, tachycardic  Respiratory: Clear to auscultation bilaterally with equal chest rise  Abdomen: Soft, nontender, nondistended, + bowel sounds  Extremities: warm dry without cyanosis clubbing or edema  Neuro: AAOx1, left-sided spastic hemiplegia (chronic), otherwise nonfocal  Skin: Without rashes exudates or nodules  Psych: Pleasant   Data Reviewed: I have personally reviewed following labs and imaging studies  CBC: Recent Labs  Lab 03/10/18 2332 03/10/18 2341 03/11/18 0828  WBC 15.5*  --  19.7*  NEUTROABS 11.0*   --   --   HGB 10.0* 10.9* 10.6*  HCT 32.6* 32.0* 35.9*  MCV 86.7  --  89.1  PLT 148*  --  136*   Basic Metabolic Panel: Recent Labs  Lab 03/10/18 1946 03/10/18 2341 03/11/18 0828  NA 141 147* 146*  K 4.3 3.6 3.7  CL 110 112* 117*  CO2 21*  --  18*  GLUCOSE 106* 111* 102*  BUN 49* 48* 32*  CREATININE 2.07* 2.10* 1.58*  CALCIUM 7.7*  --  7.5*   GFR: Estimated Creatinine Clearance: 55.3 mL/min (A) (by C-G formula based on SCr of 1.58 mg/dL (H)). Liver Function Tests: Recent Labs  Lab 03/10/18 1946  AST 48*  ALT 32  ALKPHOS 115  BILITOT 1.8*  PROT 7.8  ALBUMIN 2.3*   No results for input(s): LIPASE, AMYLASE in the last 168 hours. No results for input(s): AMMONIA in the last 168 hours. Coagulation Profile: Recent Labs  Lab 03/10/18 2332  INR 1.41   Cardiac Enzymes: No results for input(s): CKTOTAL, CKMB, CKMBINDEX, TROPONINI in the last 168 hours. BNP (last 3 results) No results for input(s): PROBNP in the last 8760 hours. HbA1C: No results for input(s): HGBA1C in the last 72 hours. CBG: Recent Labs  Lab 03/11/18 0358 03/11/18 0751 03/11/18 1130  GLUCAP 87 94 118*   Lipid Profile:  No results for input(s): CHOL, HDL, LDLCALC, TRIG, CHOLHDL, LDLDIRECT in the last 72 hours. Thyroid Function Tests: No results for input(s): TSH, T4TOTAL, FREET4, T3FREE, THYROIDAB in the last 72 hours. Anemia Panel: No results for input(s): VITAMINB12, FOLATE, FERRITIN, TIBC, IRON, RETICCTPCT in the last 72 hours. Urine analysis:    Component Value Date/Time   COLORURINE YELLOW 03/10/2018 2110   APPEARANCEUR HAZY (A) 03/10/2018 2110   LABSPEC 1.023 03/10/2018 2110   PHURINE 5.0 03/10/2018 2110   GLUCOSEU NEGATIVE 03/10/2018 2110   HGBUR LARGE (A) 03/10/2018 2110   BILIRUBINUR NEGATIVE 03/10/2018 2110   KETONESUR NEGATIVE 03/10/2018 2110   PROTEINUR 100 (A) 03/10/2018 2110   UROBILINOGEN 1.0 11/20/2009 1327   NITRITE NEGATIVE 03/10/2018 2110   LEUKOCYTESUR SMALL (A)  03/10/2018 2110   Sepsis Labs: @LABRCNTIP (procalcitonin:4,lacticidven:4)  ) Recent Results (from the past 240 hour(s))  MRSA PCR Screening     Status: None   Collection Time: 03/11/18  5:15 AM  Result Value Ref Range Status   MRSA by PCR NEGATIVE NEGATIVE Final    Comment:        The GeneXpert MRSA Assay (FDA approved for NASAL specimens only), is one component of a comprehensive MRSA colonization surveillance program. It is not intended to diagnose MRSA infection nor to guide or monitor treatment for MRSA infections. Performed at [facility], [address] [place], [place] [address]       Radiology Studies: Ct Abdomen Pelvis Wo Contrast  Result Date: 03/11/2018 CLINICAL DATA:  Fever and lethargy. EXAM: CT ABDOMEN AND PELVIS WITHOUT CONTRAST TECHNIQUE: Multidetector CT imaging of the abdomen and pelvis was performed following the standard protocol without IV contrast. COMPARISON:  None. FINDINGS: LOWER CHEST: Small pleural effusions with bibasilar atelectasis. HEPATOBILIARY: Normal hepatic contours and density. No intra- or extrahepatic biliary dilatation. Cholelithiasis without acute inflammation. PANCREAS: Normal parenchymal contours without ductal dilatation. No peripancreatic fluid collection. SPLEEN: Normal. ADRENALS/URINARY TRACT: --Adrenal glands: Left adrenal myelolipoma. --Right kidney/ureter: There is a 7 x 5 mm stone at the right ureterovesical junction causing mild hydroureteronephrosis and perinephric stranding. There are 2-3 other nonobstructing renal calculi measuring approximately 2 mm. --Left kidney/ureter: No hydronephrosis, nephroureterolithiasis, perinephric stranding or solid renal mass. --Urinary bladder: Normal for degree of distention STOMACH/BOWEL: --Stomach/Duodenum: No hiatal hernia or other gastric abnormality. Normal duodenal course. --Small bowel: No dilatation or inflammation. --Colon: No focal abnormality. --Appendix: Not visualized. No right  lower quadrant inflammation or free fluid. VASCULAR/LYMPHATIC: Infrarenal IVC filter. Minimal aortic calcification. No abdominal or pelvic lymphadenopathy. REPRODUCTIVE: Normal prostate and seminal vesicles. MUSCULOSKELETAL. 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 Soft tissue thickening extending to the skin surface superficial to the left greater trochanter (axial image 93). OTHER: None. IMPRESSION: 1. Right obstructive uropathy with 7 x 5 mm right ureterovesical junction stone causing mild hydroureteronephrosis and perinephric stranding. Multiple nonobstructing right renal calculi measuring up to 10 mm. 2. 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 This may indicate early decubitus ulcer formation. 3. Small pleural effusions and bibasilar atelectasis. 4. Aortic Atherosclerosis (ICD10-I70.0). Electronically Signed   By: Deatra Robinson M.D.   On: 03/11/2018 00:51   Ct Head Wo Contrast  Result Date: 03/11/2018 CLINICAL DATA:  Fever lethargy abnormal speech EXAM: CT HEAD WITHOUT CONTRAST TECHNIQUE: Contiguous axial images were obtained from the base of the skull through the vertex without intravenous contrast. COMPARISON:  CT brain 09/19/2007 FINDINGS: Brain: No acute  territorial infarction, hemorrhage or intracranial mass is visualized. Right posterior shunt catheter with tip terminating in the anterior aspect of the left lateral ventricle. Extensive encephalomalacia involving the right cerebral hemisphere with encephalomalacia at the inferior left frontal lobe. Moderate severe atrophy and volume loss, slight progression since comparison CT. Slight interval enlargement of the dilated ventricular system, bifrontal measurement of 6 cm, as compared with 5.5 cm on the comparison study. No midline shift. Vascular: No hyperdense vessels.  Carotid vascular calcification Skull: No fracture.  Old  left frontal and parietal burr holes. Sinuses/Orbits: Mucosal thickening in the ethmoid sinuses. No acute orbital abnormality. Other: None IMPRESSION: 1. Negative for hemorrhage or intracranial mass lesion. 2. Extensive encephalomalacia of the right hemisphere with encephalomalacia of the left frontal lobe. Cortical volume loss which appears slightly progressed since the prior CT. 3. Similar appearance and positioning of right posterior shunt catheter. Slight interval enlargement of the ventricular system as compared with 2009 CT, it is unclear if this is secondary to progression of atrophy and volume loss versus some degree of shunt malfunction. Electronically Signed   By: Jasmine Pang M.D.   On: 03/11/2018 00:50   Dg Retrograde Pyelogram  Result Date: 03/11/2018 CLINICAL DATA:  Right ureter stone. EXAM: RETROGRADE PYELOGRAM COMPARISON:  CT 03/11/2018 FINDINGS: Single retrograde pyelogram image was submitted. This appears to be an image of the right ureter and right renal collecting system. Image limited due to motion. There is a filling defect in the proximal right ureter that corresponds with the recent CT findings. IMPRESSION: Stone in the proximal right ureter. Electronically Signed   By: Richarda Overlie M.D.   On: 03/11/2018 07:41   Dg Chest Portable 1 View  Result Date: 03/10/2018 CLINICAL DATA:  Sepsis. EXAM: PORTABLE CHEST 1 VIEW COMPARISON:  Radiograph of November 20, 2009. FINDINGS: Stable cardiomediastinal silhouette. No pneumothorax or pleural effusion is noted. Stable minimal bibasilar subsegmental atelectasis is noted. Right-sided ventriculoperitoneal shunt is again noted. Bony thorax is unremarkable. IMPRESSION: Stable minimal bibasilar subsegmental atelectasis. Electronically Signed   By: Lupita Raider, M.D.   On: 03/10/2018 19:54     Scheduled Meds: . baclofen  10 mg Oral TID  . clonazePAM  0.5 mg Oral Q24H  . clonazePAM  1 mg Oral BID  . divalproex  500 mg Oral TID  . feeding supplement  (PRO-STAT SUGAR FREE 64)  30 mL Oral Daily  . insulin aspart  0-9 Units Subcutaneous TID WC  . insulin detemir  10 Units Subcutaneous Daily  . mouth rinse  15 mL Mouth Rinse BID  . metoprolol tartrate  2.5 mg Intravenous Q6H  . metoprolol tartrate  50 mg Oral QHS  . metoprolol tartrate  75 mg Oral Daily  . multivitamin with minerals  1 tablet Oral Daily  . polyethylene glycol  17 g Oral Daily  . QUEtiapine  125 mg Oral QHS  . QUEtiapine  150 mg Oral Daily  . selenium sulfide  1 application Topical Q T,Th,Sat-1800  . sertraline  100 mg Oral Daily   Continuous Infusions: . sodium chloride Stopped (03/11/18 1134)  . piperacillin-tazobactam (ZOSYN)  IV 12.5 mL/hr at 03/11/18 1300  . vancomycin       LOS: 0 days   Time Spent in minutes   45 minutes  Gennell How D.O. on 03/11/2018 at 2:08 PM  Between 7am to 7pm - Pager - [phone]  After 7pm go to [email or web address] - password [email or web address]  And look for the night coverage person covering  for me after hours  [facility] Office  [phone]

## 2018-03-11 NOTE — Consult Note (Addendum)
Urology Consult  Consulting MD:Niu, Brien FewXilin, MD  CC: Right renal stone, sepsis  HPI: This is a [age]year old male with history of traumatic head injury, who is institutionalized, who presented to the emergency room with history of fever.  On evaluation, he was found to have abdominal tenderness and underwent CT scan.  This revealed a right ureteropelvic junction stone.  Because of his hypotension, urology was consulted because of a question of sepsis.  There is not a long history of frequent urinary tract infections, but the patient's sister, who is his power of attorney, states that he had an infection about a year ago that was treated.  There is no prior history of urolithiasis.  I have spoken with the patient's sister, and I recommended urgent stent placement.  Although the urine does not appear significantly infected, there is significant hydronephrosis and perinephric inflammation.  PMH: Past Medical History:  Diagnosis Date  . Depression   . Diabetes mellitus without complication (HCC)   . GAD (generalized anxiety disorder)   . Hypertension   . Hypothyroidism   . Spastic hemiplegia (HCC)   . Thyroid disease    hypothyroidism    PSH: Past Surgical History:  Procedure Laterality Date  . VENTRICULOPERITONEAL SHUNT      Allergies: Not on File  Medications:  (Not in a hospital admission)   Social History: Social History   Socioeconomic History  . Marital status: Single    Spouse name: Not on file  . Number of children: Not on file  . Years of education: Not on file  . Highest education level: Not on file  Occupational History  . Not on file  Social Needs  . Financial resource strain: Not on file  . Food insecurity:    Worry: Not on file    Inability: Not on file  . Transportation needs:    Medical: Not on file    Non-medical: Not on file  Tobacco Use  . Smoking status: Not on file  Substance and Sexual Activity  . Alcohol use: Not on file  . Drug use: Not on  file  . Sexual activity: Not on file  Lifestyle  . Physical activity:    Days per week: Not on file    Minutes per session: Not on file  . Stress: Not on file  Relationships  . Social connections:    Talks on phone: Not on file    Gets together: Not on file    Attends religious service: Not on file    Active member of club or organization: Not on file    Attends meetings of clubs or organizations: Not on file    Relationship status: Not on file  . Intimate partner violence:    Fear of current or ex partner: Not on file    Emotionally abused: Not on file    Physically abused: Not on file    Forced sexual activity: Not on file  Other Topics Concern  . Not on file  Social History Narrative  . Not on file    Family History: No family history on file.  Review of Systems: Level V caveat.  Physical Exam: @VITALS2 @ General:   Awake. Noncommunicative Head:  Normocephalic.  Atraumatic. ENT:  EOMI.  Mucous membranes moist Neck:  Supple.  No lymphadenopathy. CV:  Regular rate. Pulmonary: Equal effort bilaterally.   Abdomen: Soft Skin:  Normal turgor.  No visible rash. Extremity: No gross deformity of upper extremities.  No gross deformity of lower  extremities. Neurologic: Awake    Studies:  Recent Labs    03/10/18 2332 03/10/18 2341  HGB 10.0* 10.9*  WBC 15.5*  --   PLT 148*  --     Recent Labs    03/10/18 1946 03/10/18 2341  NA 141 147*  K 4.3 3.6  CL 110 112*  CO2 21*  --   BUN 49* 48*  CREATININE 2.07* 2.10*  CALCIUM 7.7*  --   GFRNONAA 35*  --   GFRAA 41*  --      Recent Labs    03/10/18 2332  INR 1.41     Invalid input(s): ABG    Assessment: Probable infected, obstructing right ureteropelvic junction stone strike that  Plan: Cystoscopy, right retrograde ureteral pyelogram, placement of double-J stent emergently.  This will be followed by adequate antibiotic management, characterization of any infection and at a later date, definitive  stone management with ureteroscopy.  I have discussed the case with the patient's sister, who has power of attorney.    Pager:[phone]

## 2018-03-11 NOTE — Progress Notes (Signed)
Pre-bolus, 500 cc

## 2018-03-11 NOTE — Op Note (Signed)
Preoperative diagnosis: Infected, obstructive right proximal ureteral stone  Postoperative diagnosis: Same  Principal procedure: Cystoscopy, right retrograde ureteropyelogram, fluoroscopic interpretation, placement of 6 French by 24 cm contour double-J stent without tether  Surgeon: Helaina Stefano  Anesthesia: General endotracheal  Complications/difficulties: Extremely difficult visualization of the trigone and right ureteral orifice secondary to patient's body habitus (bilateral lower extremity contractions), difficult stent placement  Drains: 16 French Foley, 24 cm x 6 French contour double-J stent  Specimen: None  Estimated blood loss: None  Indications: 52 year old male with history of traumatic head injury, institutionalized in a skilled nursing facility, presenting earlier with an infected, obstructing right ureteral stone.  The patient is admitted for management of sepsis.  Urologic consultation is requested for management of his stone.  I have discussed the process of cystoscopy, stent placement with the patient's sister who is power of attorney.  She agrees with this procedure.  Findings: The patient had significant lower extremity contractures which made positioning extremely difficult and access to his bladder with the cystoscope difficult as well.  There were no urothelial abnormalities of the bladder, although after passing the cystoscope, the trigone was somewhat erythematous.  The left ureteral orifice was normal, the right was very difficult to find.  It was of normal configuration, however.  Retrograde study revealed a tortuous but normal in caliber right distal ureter.  There was pyelocaliectasis of the renal collecting system without evidence of filling defects.  Description of procedure: The patient was properly identified and marked in the holding area.  He is received preoperative IV antibiotics.  He was taken to the operating room where general anesthetic was administered.   Unfortunately, dorsolithotomy positioning was not possible.  His legs crossed the midline bilaterally.  We did the best we could to position him, he was then prepped and draped as well as possible.  Timeout was performed.  A 21 French panendoscope was advanced through his urethra which was found to be normal.  The prostate was nonobstructive.  It was somewhat difficult to enter the bladder due to his body habitus.  However, the scope was passed into the bladder and brief inspection of the urothelium was normal.  It took approximately 20 minutes to find even the left ureteral orifice with the 70 degree lens.  Thinking that this might be the right orifice, I did cannulated and perform a gentle retrograde which revealed a normal-appearing left distal ureter.  It took approximately 30 more minutes to find the right ureteral orifice.  Once we did find this, it was cannulated with the help of the guidewire.  Retrograde ureteral pyelogram with Omnipaque was then performed with the above-mentioned findings.  I then passed a guidewire through the open-ended catheter, and with the catheter removed approximately the ureter, was eventually able to get the guidewire all the way into the patient's upper pole calyceal system.  Following this, the open-ended catheter was removed and I passed the double-J stent over top of the guidewire.  Because of the patient's positioning and the use of the right angle lens, it was difficult to advance the double-J stent.  However, eventually I could see it was positioned within the upper pole calyceal system.  At this point, the guidewire was removed, although it was extremely difficult to remove and the end of the double-J stent tore from the procedure.  I did not quite understand why this happened, but the end was telescoping somewhat at the end of the placement.  There was a good curl at  the end of the stent in the bladder.  At this point, the bladder was drained, the scope removed and a 16  French Foley placed.  The patient was then intubated and taken to the PACU in stable condition.  He tolerated the procedure well.

## 2018-03-11 NOTE — H&P (Addendum)
History and Physical    [name] WNU:[id number] DOB: 1966/01/09 DOA: 03/10/2018  Referring MD/NP/PA:   PCP: [name], MD   Patient coming from:  The patient is coming from SNF.  At baseline, pt is dependent for most of ADL.   Chief Complaint: Fever, worsening mental status, abdominal pain  HPI: Cody [name] is a 52 y.o. 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  Per report, pt has garbled speech and is bed bound at baseline. Pt is noted to be more confused, very lethargic and less responsive in facililty in the past 2 days. He has fever and right sided abdominal tenderness. No nausea, vomiting, diarrhea notated. Per his sister, pt seems to have dysuria. Pt was reportedly to have dry cough. He was found to be hypoxic by EMS with SpO2 in the 70s on room air, given 2 duonebs en route, Spo2 improved to 95% on 4L [place]. Not sure if he has any chest pain. He has left sided spasmatic hemiplegia. No facial droop. He moves his right extremities. He is initially hypotensive with SBP 70s, which improved to SBP>100 after 3L NS bolus in ED.  ED Course: pt was found to have WBC 15.5, urinalysis with a small amount of leukocyte, 11-20 WBC, hazy appearance, rare bacteria, lactic acid 1.13, 1.17, AKI with creatinine 2.10, BUN 40, temperature 102.6, tachycardia, tachypnea, chest x-ray showed bilateral basilar subsegment atelectasis.  Patient is admitted to stepdown as inpatient. Urology, Dr.  Retta Diones was consulted.  CT-abd/pelvis showed: 1. Right obstructive uropathy with 7 x 5 mm right ureterovesical junction stone causing mild hydroureteronephrosis and perinephric stranding. Multiple nonobstructing right renal calculi measuring up to 10 mm. 2. 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 This may indicate early decubitus ulcer formation. 3. Small pleural effusions and bibasilar atelectasis. 4. Aortic Atherosclerosis (ICD10-I70.0).  CT-head showed: 1. Negative for hemorrhage or intracranial mass lesion. 2. Extensive encephalomalacia of the right hemisphere with encephalomalacia of the left frontal lobe. Cortical volume loss which appears slightly progressed since the prior CT. 3. Similar appearance and positioning of right posterior shunt catheter. Slight interval enlargement of the ventricular system ascompared with 2009 CT, it is unclear if this is secondary to progression of atrophy and volume loss versus some degree of shunt malfunction.  Review of Systems: Could not be reviewed due to nonverbal status and altered mental status  Allergy: Not on File  Past Medical History:  Diagnosis Date  . Depression   . Diabetes mellitus without complication (HCC)   . GAD (generalized anxiety disorder)   . Hypertension   . Hypothyroidism   . Spastic hemiplegia (HCC)   . Thyroid disease    hypothyroidism    Past Surgical History:  Procedure Laterality Date  . VENTRICULOPERITONEAL SHUNT      Social History:  reports that he has never smoked. He has never used smokeless tobacco. He reports that he does not drink alcohol or use drugs.  Family History:  Family History  Problem Relation Age of Onset  . Hypertension Mother   . Stroke Mother   . Hypertension Father   . Hyperlipidemia Father   . COPD Father   . Heart disease Father   . Diabetes Mellitus II Father      Prior  to Admission medications   Not on File    Physical Exam: Vitals:   03/11/18 0045 03/11/18 0100 03/11/18 0115 03/11/18 0145  BP: 101/64 127/74 133/90 (!) 105/59  Pulse: (!) 110 (!) 114 (!) 119 (!) 113  Resp: 20 (!) 25 (!) 28 (!) 23  Temp:      TempSrc:      SpO2: 97%  97% 96% 96%  Weight:      Height:       General: Not in acute distress HEENT:       Eyes: PERRL, EOMI, no scleral icterus.       ENT: No discharge from the ears and nose, no pharynx injection, no tonsillar enlargement.        Neck: No JVD, no bruit, no mass felt. Heme: No neck lymph node enlargement. Cardiac: S1/S2, RRR, No murmurs, No gallops or rubs. Respiratory: No rales, wheezing, rhonchi or rubs. GI: Soft, nondistended, right sided tenderness, no organomegaly, BS present. GU: No hematuria Ext: No pitting leg edema bilaterally. 2+DP/PT pulse bilaterally. Musculoskeletal: No joint deformities, No joint redness or warmth, no limitation of ROM in spin. Skin: No rashes.  Neuro: confused, garbled speech, cranial nerves II-XII grossly intact, left sided spasmatic hemiplegia Psych: Patient is not psychotic, no suicidal or hemocidal ideation.  Labs on Admission: I have personally reviewed following labs and imaging studies  CBC: Recent Labs  Lab 03/10/18 2332 03/10/18 2341  WBC 15.5*  --   NEUTROABS 11.0*  --   HGB 10.0* 10.9*  HCT 32.6* 32.0*  MCV 86.7  --   PLT 148*  --    Basic Metabolic Panel: Recent Labs  Lab 03/10/18 1946 03/10/18 2341  NA 141 147*  K 4.3 3.6  CL 110 112*  CO2 21*  --   GLUCOSE 106* 111*  BUN 49* 48*  CREATININE 2.07* 2.10*  CALCIUM 7.7*  --    GFR: Estimated Creatinine Clearance: 41.6 mL/min (A) (by C-G formula based on SCr of 2.1 mg/dL (H)). Liver Function Tests: Recent Labs  Lab 03/10/18 1946  AST 48*  ALT 32  ALKPHOS 115  BILITOT 1.8*  PROT 7.8  ALBUMIN 2.3*   No results for input(s): LIPASE, AMYLASE in the last 168 hours. No results for input(s): AMMONIA in the last 168 hours. Coagulation Profile: Recent Labs  Lab 03/10/18 2332  INR 1.41   Cardiac Enzymes: No results for input(s): CKTOTAL, CKMB, CKMBINDEX, TROPONINI in the last 168 hours. BNP (last 3 results) No results for input(s): PROBNP in the last 8760  hours. HbA1C: No results for input(s): HGBA1C in the last 72 hours. CBG: No results for input(s): GLUCAP in the last 168 hours. Lipid Profile: No results for input(s): CHOL, HDL, LDLCALC, TRIG, CHOLHDL, LDLDIRECT in the last 72 hours. Thyroid Function Tests: No results for input(s): TSH, T4TOTAL, FREET4, T3FREE, THYROIDAB in the last 72 hours. Anemia Panel: No results for input(s): VITAMINB12, FOLATE, FERRITIN, TIBC, IRON, RETICCTPCT in the last 72 hours. Urine analysis:    Component Value Date/Time   COLORURINE YELLOW 03/10/2018 2110   APPEARANCEUR HAZY (A) 03/10/2018 2110   LABSPEC 1.023 03/10/2018 2110   PHURINE 5.0 03/10/2018 2110   GLUCOSEU NEGATIVE 03/10/2018 2110   HGBUR LARGE (A) 03/10/2018 2110   BILIRUBINUR NEGATIVE 03/10/2018 2110   KETONESUR NEGATIVE 03/10/2018 2110   PROTEINUR 100 (A) 03/10/2018 2110   UROBILINOGEN 1.0 11/20/2009 1327   NITRITE NEGATIVE 03/10/2018 2110   LEUKOCYTESUR SMALL (A) 03/10/2018 2110   Sepsis Labs: @LABRCNTIP (procalcitonin:4,lacticidven:4) )  No results found for this or any previous visit (from the past 240 hour(s)).   Radiological Exams on Admission: Ct Abdomen Pelvis Wo Contrast  Result Date: 03/11/2018 CLINICAL DATA:  Fever and lethargy. EXAM: CT ABDOMEN AND PELVIS WITHOUT CONTRAST TECHNIQUE: Multidetector CT imaging of the abdomen and pelvis was performed following the standard protocol without IV contrast. COMPARISON:  None. FINDINGS: LOWER CHEST: Small pleural effusions with bibasilar atelectasis. HEPATOBILIARY: Normal hepatic contours and density. No intra- or extrahepatic biliary dilatation. Cholelithiasis without acute inflammation. PANCREAS: Normal parenchymal contours without ductal dilatation. No peripancreatic fluid collection. SPLEEN: Normal. ADRENALS/URINARY TRACT: --Adrenal glands: Left adrenal myelolipoma. --Right kidney/ureter: There is a 7 x 5 mm stone at the right ureterovesical junction causing mild hydroureteronephrosis and  perinephric stranding. There are 2-3 other nonobstructing renal calculi measuring approximately 2 mm. --Left kidney/ureter: No hydronephrosis, nephroureterolithiasis, perinephric stranding or solid renal mass. --Urinary bladder: Normal for degree of distention STOMACH/BOWEL: --Stomach/Duodenum: No hiatal hernia or other gastric abnormality. Normal duodenal course. --Small bowel: No dilatation or inflammation. --Colon: No focal abnormality. --Appendix: Not visualized. No right lower quadrant inflammation or free fluid. VASCULAR/LYMPHATIC: Infrarenal IVC filter. Minimal aortic calcification. No abdominal or pelvic lymphadenopathy. REPRODUCTIVE: Normal prostate and seminal vesicles. MUSCULOSKELETAL. 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 Soft tissue thickening extending to the skin surface superficial to the left greater trochanter (axial image 93). OTHER: None. IMPRESSION: 1. Right obstructive uropathy with 7 x 5 mm right ureterovesical junction stone causing mild hydroureteronephrosis and perinephric stranding. Multiple nonobstructing right renal calculi measuring up to 10 mm. 2. 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 This may indicate early decubitus ulcer formation. 3. Small pleural effusions and bibasilar atelectasis. 4. Aortic Atherosclerosis (ICD10-I70.0). Electronically Signed   By: Deatra RobinsonKevin  Herman M.D.   On: 03/11/2018 00:51   Ct Head Wo Contrast  Result Date: 03/11/2018 CLINICAL DATA:  Fever lethargy abnormal speech EXAM: CT HEAD WITHOUT CONTRAST TECHNIQUE: Contiguous axial images were obtained from the base of the skull through the vertex without intravenous contrast. COMPARISON:  CT brain 09/19/2007 FINDINGS: Brain: No acute territorial infarction, hemorrhage or intracranial mass is visualized. Right posterior shunt catheter with tip terminating in the anterior aspect of the  left lateral ventricle. Extensive encephalomalacia involving the right cerebral hemisphere with encephalomalacia at the inferior left frontal lobe. Moderate severe atrophy and volume loss, slight progression since comparison CT. Slight interval enlargement of the dilated ventricular system, bifrontal measurement of 6 cm, as compared with 5.5 cm on the comparison study. No midline shift. Vascular: No hyperdense vessels.  Carotid vascular calcification Skull: No fracture.  Old left frontal and parietal burr holes. Sinuses/Orbits: Mucosal thickening in the ethmoid sinuses. No acute orbital abnormality. Other: None IMPRESSION: 1. Negative for hemorrhage or intracranial mass lesion. 2. Extensive encephalomalacia of the right hemisphere with encephalomalacia of the left frontal lobe. Cortical volume loss which appears slightly progressed since the prior CT. 3. Similar appearance and positioning of right posterior shunt catheter. Slight interval enlargement of the ventricular system as compared with 2009 CT, it is unclear if this is secondary to progression of atrophy and volume loss versus some degree of shunt malfunction. Electronically Signed   By: Jasmine PangKim  Fujinaga M.D.   On: 03/11/2018 00:50   Dg Chest Portable 1 View  Result Date: 03/10/2018 CLINICAL DATA:  Sepsis. EXAM: PORTABLE CHEST 1 VIEW COMPARISON:  Radiograph of November 20, 2009. FINDINGS: Stable cardiomediastinal silhouette.  No pneumothorax or pleural effusion is noted. Stable minimal bibasilar subsegmental atelectasis is noted. Right-sided ventriculoperitoneal shunt is again noted. Bony thorax is unremarkable. IMPRESSION: Stable minimal bibasilar subsegmental atelectasis. Electronically Signed   By: Lupita Raider, M.D.   On: 03/10/2018 19:54     EKG: Independently reviewed.  Sinus rhythm, QTC 480, LAD, LAE, poor R wave progression, nonspecific T wave change.    Assessment/Plan Principal Problem:   Acute pyelonephritis Active Problems:    Depression   Diabetes mellitus without complication (HCC)   Hypertension   Spastic hemiplegia (HCC)   Sepsis (HCC)   AKI (acute kidney injury) (HCC)   Hydroureteronephrosis-right   Ureterovesical junction (UVJ) obstruction   Acute metabolic encephalopathy   Acute respiratory failure with hypoxia (HCC)   Sepsis due to acute pyelonephritis 2/2 to ureterovesical junction (UVJ) obstruction: Patient is septic with fever, hypotension, leukocytosis, tachycardia and tachypnea.  Lactic acid is normal.  Blood pressure responded to IV fluid resuscitation. currently hemodynamically stable.  Dr. Retta Diones of neurology was consulted for urgent stent placement.  -will admit to SDU as inpt -IV vanco and zosyn -f/u Bx and Ux -will get Procalcitonin and trend lactic acid levels per sepsis protocol. -IVF: 4.5 L of NS bolus in ED, followed by 125 cc/h -->if pt develops hypotension again, may need to consult PCCM for vasopressor use.  AKI due to acute pyelonephritis, obstructive uropathy and hydroureteronephrosis-right: -Abx as above -per Dr. Retta Diones, patient needs "urgent cystoscopy, right retrograde ureteral pyelogram, placement of double-J stent emergently".  Hypertension: -hold home Bp meds -IV hydralazine as needed  Depression and anxiety: -Klonopin, Seroquel, Zoloft, Depakote  Diabetes mellitus without complication (HCC): Last A1c 7.5 on 11/21/09. Not well controled. Patient is taking Levemir at home -will decrease Levemir dose from 20 to 10 units daily -SSI -Check A1c  Spastic hemiplegia 2/2  traumatic brain injury: -Baclofen  Acute metabolic encephalopathy: Likely due to sepsis and pyelonephritis.  CT head did not show acute intracranial abnormalities, but showed "Slight interval enlargement of the ventricular system ascompared with 2009 CT, it is unclear if this is secondary to progression of atrophy and volume loss versus some degree of shunt malfunction". -Need to discuss with  neurosurgeon -Frequent neuro check  Acute respiratory failure with hypoxia [facility]): Etiology is not clear.  Currently patient does not have active cough.  Saturation 95% on 2 L nasal cannula oxygen.  Chest x-ray did not show obvious infiltration.  Patient may have mild aspiration?. -on antibiotics as above -DuoNeb nebulizers, PRN albuterol nebulizers, prn Mucinex for cough   DVT ppx: SCD Code Status: Full code Family Communication:   Yes, patient's sister at bed side Disposition Plan:  Anticipate discharge back to previous SNF Consults called: Urology, Dr. Retta Diones Admission status:   SDU/inpation       Date of Service 03/11/2018    Lorretta Harp [facility] Pager [phone]  If 7PM-7AM, please contact night-coverage [email or web address] Password [facility] 03/11/2018, 2:46 AM

## 2018-03-11 NOTE — Progress Notes (Signed)
After 500 cc bolus

## 2018-03-12 ENCOUNTER — Other Ambulatory Visit: Payer: Self-pay

## 2018-03-12 ENCOUNTER — Inpatient Hospital Stay (HOSPITAL_COMMUNITY): Payer: Medicare Other

## 2018-03-12 ENCOUNTER — Encounter (HOSPITAL_COMMUNITY): Payer: Self-pay | Admitting: Urology

## 2018-03-12 DIAGNOSIS — L8992 Pressure ulcer of unspecified site, stage 2: Secondary | ICD-10-CM

## 2018-03-12 DIAGNOSIS — J9601 Acute respiratory failure with hypoxia: Secondary | ICD-10-CM

## 2018-03-12 DIAGNOSIS — F329 Major depressive disorder, single episode, unspecified: Secondary | ICD-10-CM

## 2018-03-12 LAB — CBC
HCT: 31.2 % — ABNORMAL LOW (ref 39.0–52.0)
HEMOGLOBIN: 9.4 g/dL — AB (ref 13.0–17.0)
MCH: 26.3 pg (ref 26.0–34.0)
MCHC: 30.1 g/dL (ref 30.0–36.0)
MCV: 87.2 fL (ref 78.0–100.0)
Platelets: 141 10*3/uL — ABNORMAL LOW (ref 150–400)
RBC: 3.58 MIL/uL — ABNORMAL LOW (ref 4.22–5.81)
RDW: 18 % — ABNORMAL HIGH (ref 11.5–15.5)
WBC: 16.1 10*3/uL — AB (ref 4.0–10.5)

## 2018-03-12 LAB — GLUCOSE, CAPILLARY
GLUCOSE-CAPILLARY: 100 mg/dL — AB (ref 70–99)
GLUCOSE-CAPILLARY: 82 mg/dL (ref 70–99)
Glucose-Capillary: 95 mg/dL (ref 70–99)
Glucose-Capillary: 78 mg/dL (ref 70–99)

## 2018-03-12 LAB — URINE CULTURE: CULTURE: NO GROWTH

## 2018-03-12 LAB — BASIC METABOLIC PANEL
ANION GAP: 9 (ref 5–15)
BUN: 44 mg/dL — ABNORMAL HIGH (ref 6–20)
CALCIUM: 7.7 mg/dL — AB (ref 8.9–10.3)
CO2: 23 mmol/L (ref 22–32)
CREATININE: 2.22 mg/dL — AB (ref 0.61–1.24)
Chloride: 114 mmol/L — ABNORMAL HIGH (ref 98–111)
GFR calc non Af Amer: 33 mL/min — ABNORMAL LOW (ref >60)
GFR, EST AFRICAN AMERICAN: 38 mL/min — AB (ref >60)
Glucose, Bld: 101 mg/dL — ABNORMAL HIGH (ref 70–99)
Potassium: 3.5 mmol/L (ref 3.5–5.1)
SODIUM: 146 mmol/L — AB (ref 135–145)

## 2018-03-12 MED ORDER — FUROSEMIDE 10 MG/ML IJ SOLN
40.0000 mg | Freq: Once | INTRAMUSCULAR | Status: AC
  Administered 2018-03-12: 40 mg via INTRAVENOUS
  Filled 2018-03-12: qty 4

## 2018-03-12 NOTE — Progress Notes (Signed)
SLP Cancellation Note  Patient Details Name: Rodrigo RanBobby Bala MRN: [id number] DOB: 02/01/1966   Cancelled treatment:        RN informed this SLP that pt is very sleepy today. She reported pt coughing Thurs night with food. SLP downgraded diet to D1.   [name], Alexiah Koroma Willis 03/12/2018, 5:21 PM   Breck CoonsLisa Willis Lonell FaceLitaker M.Ed [employer] Pager [phone]

## 2018-03-12 NOTE — Progress Notes (Signed)
I was called by the nurse to review the patient's KUB.  Per the operative note the wire broke when removing it from the stent and this is visible on the KUB today with a piece of the wire in the distal stent. This was likely a sensor wire as they stents can be difficult to slide over a sensor wire. This should not affect stent function, nor future ureteroscopy, but it may change how Dr. Retta Dionesahlstedt removes the stent for ureteroscopy. He may want to pass a wire beside the stent prior to removal. I will notify him.

## 2018-03-12 NOTE — Progress Notes (Signed)
I have reviewed the KUB with Dr. Mena GoesEskridge.  The stent placement was very difficult due to the patient's body habitus.  I do believe that there may be some parts of the guidewire that may be retained in the stent.  With the patient having a negative urine culture and blood cultures, there is a chance that his current status is not due to urosepsis.  If there is question of inadequate drainage of the right renal unit, and further drainage is needed, I would suggest proceeding with percutaneous nephrostomy tube placement by interventional radiology.  We will arrange for proper follow-up from our standpoint at a later time.

## 2018-03-12 NOTE — Consult Note (Signed)
Reason for Consult: Question shunt malfunction Referring Physician: [facility]  Cody [name] is an 52 y.o. male.  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 Patient has been on IV vancomycin and Zosyn and was taken to surgery yesterday for stent placement. Yesterday evening was noted to have a decline in mental status to which she bounced back from but then this morning was also noted to have a decline in his mental status. He is on a host of psychotropic medications in addition to his urosepsis and he has been febrile throughout the night. Upon my evaluation patient was wide-awake and cannot can follow commands the nurses said he was significantly improved from this morning but not quite at the baseline that he presented to the hospital with. CT scan obtained does show ventriculomegaly and compared to a CT scan from 2009 there may be slight dilatation of the gets a significant change but there may be a little bit more transependymal absorption.  Past Medical History:  Diagnosis Date  . Depression   . Diabetes mellitus without complication ([place])   . GAD (generalized anxiety disorder)   . Hypertension   . Hypothyroidism   . Spastic hemiplegia ([place])   . Thyroid disease    hypothyroidism    Past Surgical History:  Procedure Laterality Date  . CYSTOSCOPY W/ URETERAL STENT PLACEMENT Right 03/11/2018   Procedure: CYSTOSCOPY WITH RETROGRADE PYELOGRAM/DOUBLE J STENT PLACEMENT;  Surgeon: Franchot Gallo, MD;  Location: [facility];  Service: Urology;  Laterality: Right;  . VENTRICULOPERITONEAL SHUNT      Family History  Problem Relation Age of Onset  . Hypertension Mother   . Stroke Mother   . Hypertension Father   . Hyperlipidemia Father   . COPD Father   . Heart disease Father   . Diabetes Mellitus II Father     Social  History:  reports that he has never smoked. He has never used smokeless tobacco. He reports that he does not drink alcohol or use drugs.  Allergies: Not on File  Medications: I have reviewed the patient's current medications.  Results for orders placed or performed during the hospital encounter of 03/10/18 (from the past 48 hour(s))  Comprehensive metabolic panel     Status: Abnormal   Collection Time: 03/10/18  7:46 PM  Result Value Ref Range   Sodium 141 135 - 145 mmol/L   Potassium 4.3 3.5 - 5.1 mmol/L   Chloride 110 98 - 111 mmol/L   CO2 21 (L) 22 - 32 mmol/L   Glucose, Bld 106 (H) 70 - 99 mg/dL   BUN 49 (H) 6 - 20 mg/dL   Creatinine, Ser 2.07 (H) 0.61 - 1.24 mg/dL   Calcium 7.7 (L) 8.9 - 10.3 mg/dL   Total Protein 7.8 6.5 - 8.1 g/dL   Albumin 2.3 (L) 3.5 - 5.0 g/dL   AST 48 (H) 15 - 41 U/L   ALT 32 0 - 44 U/L   Alkaline Phosphatase 115 38 - 126 U/L   Total Bilirubin 1.8 (H) 0.3 - 1.2 mg/dL   GFR calc non Af Amer 35 (L) >60 mL/min   GFR calc Af Amer 41 (L) >60 mL/min    Comment: (NOTE) The eGFR has been calculated using the CKD EPI equation. This calculation has not been validated in all clinical situations. eGFR's persistently <60 mL/min signify possible Chronic  Kidney Disease.    Anion gap 10 5 - 15    Comment: Performed at [facility] [address] [place], [place] [address]  Culture, blood (Routine x 2)     Status: None (Preliminary result)   Collection Time: 03/10/18  7:46 PM  Result Value Ref Range   Specimen Description BLOOD RIGHT ANTECUBITAL    Special Requests      BOTTLES DRAWN AEROBIC AND ANAEROBIC Blood Culture adequate volume   Culture      NO GROWTH 2 DAYS Performed at [facility], [place] [name] [place], [place] [address]    Report Status PENDING   I-Stat CG4 Lactic Acid, ED     Status: None   Collection Time: 03/10/18  7:58 PM  Result Value Ref Range   Lactic Acid, Venous 1.13 0.5 - 1.9 mmol/L  Culture, blood (Routine x 2)      Status: None (Preliminary result)   Collection Time: 03/10/18  8:35 PM  Result Value Ref Range   Specimen Description BLOOD RIGHT HAND    Special Requests      BOTTLES DRAWN AEROBIC ONLY Blood Culture results may not be optimal due to an inadequate volume of blood received in culture bottles   Culture      NO GROWTH 2 DAYS Performed at [facility] [address], [place], [place] [address]    Report Status PENDING   Urinalysis, Routine w reflex microscopic     Status: Abnormal   Collection Time: 03/10/18  9:10 PM  Result Value Ref Range   Color, Urine YELLOW YELLOW   APPearance HAZY (A) CLEAR   Specific Gravity, Urine 1.023 1.005 - 1.030   pH 5.0 5.0 - 8.0   Glucose, UA NEGATIVE NEGATIVE mg/dL   Hgb urine dipstick LARGE (A) NEGATIVE   Bilirubin Urine NEGATIVE NEGATIVE   Ketones, ur NEGATIVE NEGATIVE mg/dL   Protein, ur 100 (A) NEGATIVE mg/dL   Nitrite NEGATIVE NEGATIVE   Leukocytes, UA SMALL (A) NEGATIVE   RBC / HPF >50 (H) 0 - 5 RBC/hpf   WBC, UA 11-20 0 - 5 WBC/hpf   Bacteria, UA RARE (A) NONE SEEN   Mucus PRESENT    Amorphous Crystal PRESENT     Comment: Performed at [facility], [address], [place], [place] [address]  Urine culture     Status: None   Collection Time: 03/10/18  9:10 PM  Result Value Ref Range   Specimen Description URINE, RANDOM    Special Requests NONE    Culture      NO GROWTH Performed at [facility], [place] [address] [place],  [address]    Report Status 03/12/2018 FINAL   CBC with Differential     Status: Abnormal   Collection Time: 03/10/18 11:32 PM  Result Value Ref Range   WBC 15.5 (H) 4.0 - 10.5 K/uL   RBC 3.76 (L) 4.22 - 5.81 MIL/uL   Hemoglobin 10.0 (L) 13.0 - 17.0 g/dL   HCT 32.6 (L) 39.0 - 52.0 %   MCV 86.7 78.0 - 100.0 fL   MCH 26.6 26.0 - 34.0 pg   MCHC 30.7 30.0 - 36.0 g/dL   RDW 18.1 (H) 11.5 - 15.5 %   Platelets 148 (L) 150 - 400 K/uL   Neutrophils Relative % 71 %   Neutro Abs 11.0 (H) 1.7 - 7.7  K/uL   Lymphocytes Relative 14 %   Lymphs Abs 2.1 0.7 -  4.0 K/uL   Monocytes Relative 14 %   Monocytes Absolute 2.2 (H) 0.1 - 1.0 K/uL   Eosinophils Relative 0 %   Eosinophils Absolute 0.0 0.0 - 0.7 K/uL   Basophils Relative 0 %   Basophils Absolute 0.0 0.0 - 0.1 K/uL   Immature Granulocytes 1 %   Abs Immature Granulocytes 0.2 (H) 0.0 - 0.1 K/uL    Comment: Performed at [facility] [address] [place], [place] [address]  Protime-INR     Status: Abnormal   Collection Time: 03/10/18 11:32 PM  Result Value Ref Range   Prothrombin Time 17.1 (H) 11.4 - 15.2 seconds   INR 1.41     Comment: Performed at [facility] [address], [place], [place] [address]  I-Stat CG4 Lactic Acid, ED     Status: None   Collection Time: 03/10/18 11:40 PM  Result Value Ref Range   Lactic Acid, Venous 1.17 0.5 - 1.9 mmol/L  I-Stat Chem 8, ED     Status: Abnormal   Collection Time: 03/10/18 11:41 PM  Result Value Ref Range   Sodium 147 (H) 135 - 145 mmol/L   Potassium 3.6 3.5 - 5.1 mmol/L   Chloride 112 (H) 98 - 111 mmol/L   BUN 48 (H) 6 - 20 mg/dL   Creatinine, Ser 2.10 (H) 0.61 - 1.24 mg/dL   Glucose, Bld 111 (H) 70 - 99 mg/dL   Calcium, Ion 1.01 (L) 1.15 - 1.40 mmol/L   TCO2 25 22 - 32 mmol/L   Hemoglobin 10.9 (L) 13.0 - 17.0 g/dL   HCT 32.0 (L) 39.0 - 52.0 %  I-Stat Troponin, ED (not at [facility])     Status: None   Collection Time: 03/10/18 11:42 PM  Result Value Ref Range   Troponin i, poc 0.03 0.00 - 0.08 ng/mL   Comment 3            Comment: Due to the release kinetics of cTnI, a negative result within the first hours of the onset of symptoms does not rule out myocardial infarction with certainty. If myocardial infarction is still suspected, repeat the test at appropriate intervals.   Type and screen [place]     Status: None   Collection Time: 03/11/18  1:30 AM  Result Value Ref Range   ABO/RH(D) O POS    Antibody Screen NEG    Sample Expiration       03/14/2018 Performed at [facility], [place] [address], [place], [place] [address]   ABO/Rh     Status: None   Collection Time: 03/11/18  1:30 AM  Result Value Ref Range   ABO/RH(D)      O POS Performed at [facility] [address] [place], [place] [address]   HIV antibody (Routine Screening)     Status: None   Collection Time: 03/11/18  1:37 AM  Result Value Ref Range   HIV Screen 4th Generation wRfx Non Reactive Non Reactive    Comment: (NOTE) Performed At: [facility] [place], [place] [id number] Rush Farmer MD [id number]   APTT     Status: None   Collection Time: 03/11/18  1:37 AM  Result Value Ref Range   aPTT 35 24 - 36 seconds    Comment: Performed at [facility], [place] [address], [place], [place] [address]  Glucose, capillary     Status: None   Collection Time: 03/11/18  3:58  AM  Result Value Ref Range   Glucose-Capillary 87 70 - 99 mg/dL  MRSA PCR Screening     Status: None   Collection Time: 03/11/18  5:15 AM  Result Value Ref Range   MRSA by PCR NEGATIVE NEGATIVE    Comment:        The GeneXpert MRSA Assay (FDA approved for NASAL specimens only), is one component of a comprehensive MRSA colonization surveillance program. It is not intended to diagnose MRSA infection nor to guide or monitor treatment for MRSA infections. Performed at [facility], [place] [address] [place], [place] [address]   Lactic acid, plasma     Status: None   Collection Time: 03/11/18  6:24 AM  Result Value Ref Range   Lactic Acid, Venous 1.6 0.5 - 1.9 mmol/L    Comment: Performed at [facility] [address], [place], [place] [address]  Procalcitonin     Status: None   Collection Time: 03/11/18  6:24 AM  Result Value Ref Range   Procalcitonin 3.80 ng/mL    Comment:        Interpretation: PCT > 2 ng/mL: Systemic infection (sepsis) is likely, unless other causes are known. (NOTE)       Sepsis PCT Algorithm            Lower Respiratory Tract                                      Infection PCT Algorithm    ----------------------------     ----------------------------         PCT < 0.25 ng/mL                PCT < 0.10 ng/mL         Strongly encourage             Strongly discourage   discontinuation of antibiotics    initiation of antibiotics    ----------------------------     -----------------------------       PCT 0.25 - 0.50 ng/mL            PCT 0.10 - 0.25 ng/mL               OR       >80% decrease in PCT            Discourage initiation of                                            antibiotics      Encourage discontinuation           of antibiotics    ----------------------------     -----------------------------         PCT >= 0.50 ng/mL              PCT 0.26 - 0.50 ng/mL               AND       <80% decrease in PCT              Encourage initiation of  antibiotics       Encourage continuation           of antibiotics    ----------------------------     -----------------------------        PCT >= 0.50 ng/mL                  PCT > 0.50 ng/mL               AND         increase in PCT                  Strongly encourage                                      initiation of antibiotics    Strongly encourage escalation           of antibiotics                                     -----------------------------                                           PCT <= 0.25 ng/mL                                                 OR                                        > 80% decrease in PCT                                     Discontinue / Do not initiate                                             antibiotics Performed at [facility], [address], [place], [place] [address]   Glucose, capillary     Status: None   Collection Time: 03/11/18  7:51 AM  Result Value Ref Range   Glucose-Capillary 94 70 - 99 mg/dL   Comment 1 Capillary Specimen   CBC      Status: Abnormal   Collection Time: 03/11/18  8:28 AM  Result Value Ref Range   WBC 19.7 (H) 4.0 - 10.5 K/uL   RBC 4.03 (L) 4.22 - 5.81 MIL/uL   Hemoglobin 10.6 (L) 13.0 - 17.0 g/dL   HCT 35.9 (L) 39.0 - 52.0 %   MCV 89.1 78.0 - 100.0 fL   MCH 26.3 26.0 - 34.0 pg   MCHC 29.5 (L) 30.0 - 36.0 g/dL   RDW 18.2 (H) 11.5 - 15.5 %   Platelets 136 (L) 150 - 400 K/uL    Comment: Performed at [facility], [place] [address], [place], [place] [address]  Basic metabolic panel  Status: Abnormal   Collection Time: 03/11/18  8:28 AM  Result Value Ref Range   Sodium 146 (H) 135 - 145 mmol/L   Potassium 3.7 3.5 - 5.1 mmol/L   Chloride 117 (H) 98 - 111 mmol/L   CO2 18 (L) 22 - 32 mmol/L   Glucose, Bld 102 (H) 70 - 99 mg/dL   BUN 32 (H) 6 - 20 mg/dL   Creatinine, Ser 1.58 (H) 0.61 - 1.24 mg/dL   Calcium 7.5 (L) 8.9 - 10.3 mg/dL   GFR calc non Af Amer 49 (L) >60 mL/min   GFR calc Af Amer 57 (L) >60 mL/min    Comment: (NOTE) The eGFR has been calculated using the CKD EPI equation. This calculation has not been validated in all clinical situations. eGFR's persistently <60 mL/min signify possible Chronic Kidney Disease.    Anion gap 11 5 - 15    Comment: Performed at [facility] [address], [place], [place] [address]  Glucose, capillary     Status: Abnormal   Collection Time: 03/11/18 11:30 AM  Result Value Ref Range   Glucose-Capillary 118 (H) 70 - 99 mg/dL   Comment 1 Capillary Specimen   Glucose, capillary     Status: Abnormal   Collection Time: 03/11/18  4:01 PM  Result Value Ref Range   Glucose-Capillary 112 (H) 70 - 99 mg/dL   Comment 1 Capillary Specimen   Glucose, capillary     Status: Abnormal   Collection Time: 03/11/18  9:55 PM  Result Value Ref Range   Glucose-Capillary 119 (H) 70 - 99 mg/dL   Comment 1 Notify RN   I-STAT 3, arterial blood gas (G3+)     Status: Abnormal   Collection Time: 03/11/18 11:02 PM  Result Value Ref Range   pH, Arterial 7.297 (L)  7.350 - 7.450   pCO2 arterial 44.8 32.0 - 48.0 mmHg   pO2, Arterial 78.0 (L) 83.0 - 108.0 mmHg   Bicarbonate 21.6 20.0 - 28.0 mmol/L   TCO2 23 22 - 32 mmol/L   O2 Saturation 93.0 %   Acid-base deficit 4.0 (H) 0.0 - 2.0 mmol/L   Patient temperature 101.0 F    Collection site RADIAL, ALLEN'S TEST ACCEPTABLE    Drawn by RT    Sample type ARTERIAL   Glucose, capillary     Status: None   Collection Time: 03/12/18  7:20 AM  Result Value Ref Range   Glucose-Capillary 95 70 - 99 mg/dL   Comment 1 Capillary Specimen   CBC     Status: Abnormal   Collection Time: 03/12/18  7:59 AM  Result Value Ref Range   WBC 16.1 (H) 4.0 - 10.5 K/uL   RBC 3.58 (L) 4.22 - 5.81 MIL/uL   Hemoglobin 9.4 (L) 13.0 - 17.0 g/dL   HCT 31.2 (L) 39.0 - 52.0 %   MCV 87.2 78.0 - 100.0 fL   MCH 26.3 26.0 - 34.0 pg   MCHC 30.1 30.0 - 36.0 g/dL   RDW 18.0 (H) 11.5 - 15.5 %   Platelets 141 (L) 150 - 400 K/uL    Comment: Performed at [facility], [place] [address] [place], [place] [address]  Basic metabolic panel     Status: Abnormal   Collection Time: 03/12/18  7:59 AM  Result Value Ref Range   Sodium 146 (H) 135 - 145 mmol/L   Potassium 3.5 3.5 - 5.1 mmol/L   Chloride 114 (H) 98 - 111 mmol/L  CO2 23 22 - 32 mmol/L   Glucose, Bld 101 (H) 70 - 99 mg/dL   BUN 44 (H) 6 - 20 mg/dL   Creatinine, Ser 2.22 (H) 0.61 - 1.24 mg/dL   Calcium 7.7 (L) 8.9 - 10.3 mg/dL   GFR calc non Af Amer 33 (L) >60 mL/min   GFR calc Af Amer 38 (L) >60 mL/min    Comment: (NOTE) The eGFR has been calculated using the CKD EPI equation. This calculation has not been validated in all clinical situations. eGFR's persistently <60 mL/min signify possible Chronic Kidney Disease.    Anion gap 9 5 - 15    Comment: Performed at [facility] [address] [place], [place] [address]    Ct Abdomen Pelvis Wo Contrast  Result Date: 03/11/2018 CLINICAL DATA:  Fever and lethargy. EXAM: CT ABDOMEN AND PELVIS WITHOUT CONTRAST TECHNIQUE:  Multidetector CT imaging of the abdomen and pelvis was performed following the standard protocol without IV contrast. COMPARISON:  None. FINDINGS: LOWER CHEST: Small pleural effusions with bibasilar atelectasis. HEPATOBILIARY: Normal hepatic contours and density. No intra- or extrahepatic biliary dilatation. Cholelithiasis without acute inflammation. PANCREAS: Normal parenchymal contours without ductal dilatation. No peripancreatic fluid collection. SPLEEN: Normal. ADRENALS/URINARY TRACT: --Adrenal glands: Left adrenal myelolipoma. --Right kidney/ureter: There is a 7 x 5 mm stone at the right ureterovesical junction causing mild hydroureteronephrosis and perinephric stranding. There are 2-3 other nonobstructing renal calculi measuring approximately 2 mm. --Left kidney/ureter: No hydronephrosis, nephroureterolithiasis, perinephric stranding or solid renal mass. --Urinary bladder: Normal for degree of distention STOMACH/BOWEL: --Stomach/Duodenum: No hiatal hernia or other gastric abnormality. Normal duodenal course. --Small bowel: No dilatation or inflammation. --Colon: No focal abnormality. --Appendix: Not visualized. No right lower quadrant inflammation or free fluid. VASCULAR/LYMPHATIC: Infrarenal IVC filter. Minimal aortic calcification. No abdominal or pelvic lymphadenopathy. REPRODUCTIVE: Normal prostate and seminal vesicles. MUSCULOSKELETAL. 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 Soft tissue thickening extending to the skin surface superficial to the left greater trochanter (axial image 93). OTHER: None. IMPRESSION: 1. Right obstructive uropathy with 7 x 5 mm right ureterovesical junction stone causing mild hydroureteronephrosis and perinephric stranding. Multiple nonobstructing right renal calculi measuring up to 10 mm. 2. 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 This may indicate  early decubitus ulcer formation. 3. Small pleural effusions and bibasilar atelectasis. 4. Aortic Atherosclerosis (ICD10-I70.0). Electronically Signed   By: Ulyses Jarred M.D.   On: 03/11/2018 00:51   Ct Head Wo Contrast  Result Date: 03/11/2018 CLINICAL DATA:  Fever lethargy abnormal speech EXAM: CT HEAD WITHOUT CONTRAST TECHNIQUE: Contiguous axial images were obtained from the base of the skull through the vertex without intravenous contrast. COMPARISON:  CT brain 09/19/2007 FINDINGS: Brain: No acute territorial infarction, hemorrhage or intracranial mass is visualized. Right posterior shunt catheter with tip terminating in the anterior aspect of the left lateral ventricle. Extensive encephalomalacia involving the right cerebral hemisphere with encephalomalacia at the inferior left frontal lobe. Moderate severe atrophy and volume loss, slight progression since comparison CT. Slight interval enlargement of the dilated ventricular system, bifrontal measurement of 6 cm, as compared with 5.5 cm on the comparison study. No midline shift. Vascular: No hyperdense vessels.  Carotid vascular calcification Skull: No fracture.  Old left frontal and parietal burr holes. Sinuses/Orbits: Mucosal thickening in the ethmoid sinuses. No acute orbital abnormality. Other: None IMPRESSION: 1. Negative for hemorrhage or intracranial mass lesion. 2. Extensive encephalomalacia of  the right hemisphere with encephalomalacia of the left frontal lobe. Cortical volume loss which appears slightly progressed since the prior CT. 3. Similar appearance and positioning of right posterior shunt catheter. Slight interval enlargement of the ventricular system as compared with 2009 CT, it is unclear if this is secondary to progression of atrophy and volume loss versus some degree of shunt malfunction. Electronically Signed   By: Donavan Foil M.D.   On: 03/11/2018 00:50   Dg Retrograde Pyelogram  Result Date: 03/11/2018 CLINICAL DATA:  Right  ureter stone. EXAM: RETROGRADE PYELOGRAM COMPARISON:  CT 03/11/2018 FINDINGS: Single retrograde pyelogram image was submitted. This appears to be an image of the right ureter and right renal collecting system. Image limited due to motion. There is a filling defect in the proximal right ureter that corresponds with the recent CT findings. IMPRESSION: Stone in the proximal right ureter. Electronically Signed   By: Markus Daft M.D.   On: 03/11/2018 07:41   Dg Chest Port 1 View  Result Date: 03/12/2018 CLINICAL DATA:  Acute in cephalopathy EXAM: PORTABLE CHEST 1 VIEW COMPARISON:  03/10/2018 FINDINGS: Cardiac shadow is stable. The overall inspiratory effort is poor with bibasilar atelectasis right greater than left. No bony abnormality is seen. VP shunt is noted as well as postsurgical change in the cervical spine. IMPRESSION: Poor inspiratory effort with bibasilar atelectasis right greater than left. Electronically Signed   By: Inez Catalina M.D.   On: 03/12/2018 08:22   Dg Chest Portable 1 View  Result Date: 03/10/2018 CLINICAL DATA:  Sepsis. EXAM: PORTABLE CHEST 1 VIEW COMPARISON:  Radiograph of November 20, 2009. FINDINGS: Stable cardiomediastinal silhouette. No pneumothorax or pleural effusion is noted. Stable minimal bibasilar subsegmental atelectasis is noted. Right-sided ventriculoperitoneal shunt is again noted. Bony thorax is unremarkable. IMPRESSION: Stable minimal bibasilar subsegmental atelectasis. Electronically Signed   By: Marijo Conception, M.D.   On: 03/10/2018 19:54    Review of Systems  Unable to perform ROS: Critical illness   Blood pressure 106/67, pulse (!) 116, temperature 97.7 F (36.5 C), temperature source Oral, resp. rate (!) 25, height _0  (1.753 m), weight 78.1 kg (172 lb 2.9 oz), SpO2 96 %. Physical Exam  Constitutional: He appears well-developed.  HENT:  Head: Normocephalic.  Eyes: Pupils are equal, round, and reactive to light.  Neurological: He is alert.  Patient is  wide-awake he tracks he will not follow commands he has a baseline left spastic hemiparesis right side appears to have good strength to painful stimuli withdrawal but again will not follow commands.    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 He does appear to be an older [employer] or [employer] medical nonprogrammable shunt. There is a reservoir that's palpable and visualized on the CAT scan. We have ordered a shunt series for that'll give [place] a little more information. CT of his abdomen shows no evidence of pseudocyst or overt distal obstruction. I think at this point is unclear whether is declining mental status is related to the shunt or his urosepsis. I think it is very unlikely that his shunt failed acutely and do not think it significantly changed from his 2009 shunt. I do think that the shunt may have either a partial obstruction or his compliance is so poor from his previous stroke and brain injury that the valve setting may need to be lowered however  in a nonprogrammable shunt this will require shunt replacement. Certainly we would not want to consider replacing his shunt until we were completely cleared from any type of infection especially urosepsis. So we'll await the results shunt series continue to follow him neurologically watch his mental status will give consideration to a shunt tap however I do not want to introduce infection to the shunt to be systemically so infected. So, in order the supplies and keep him at the bedside. I do not think that at this point he needs to have a shunt removed and an external ventricular drain placed based on the only slight dilatation of his vents compared to 2009. We will continue to follow his mental status along with you and consider either shunt tap or drain placement showed the infection become more  under control and mental status not improved.  Versie Fleener P 03/12/2018, 10:12 AM

## 2018-03-12 NOTE — Progress Notes (Signed)
PROGRESS NOTE    Cody [name]  ZOX:[id number] DOB: 03-08-1966 DOA: 03/10/2018 PCP: [name], MD   Brief Narrative:  HPI On 03/11/2018 by Dr. Lorretta Harp Cody [name] is a 52 y.o. 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  Per report, pt has garbled speech and is bed bound at baseline. Pt is noted to be more confused, very lethargic and less responsive in facililty in the past 2 days. He has fever and right sided abdominal tenderness. No nausea, vomiting, diarrhea notated. Per his sister, pt seems to have dysuria. Pt was reportedly to have dry cough. He was found to be hypoxic by EMS with SpO2 in the 70s on room air, given 2 duonebs en route, Spo2 improved to 95% on 4L [place]. Not sure if he has any chest pain. He has left sided spasmatic hemiplegia. No facial droop. He moves his right extremities. He is initially hypotensive with SBP 70s, which improved to SBP>100 after 3L NS bolus in ED.  Assessment & Plan   Severe sepsis secondary to acute pyelonephrosis secondary to obstructive uropathy -Presented with tachycardia, fever, leukocytosis, AKI and hypotension.  Hypotension has been responsive to IV fluids. -CT abdomen/pelvis showed right obstructive uropathy with 7 x 5 mm right ureterovesicular junction stone causing mild hydroureteronephrosis and perinephric stranding.  Multiple nonobstructing right renal calculi measuring up to 10 mm. -Blood cultures show no growth to date -Urine culture shows no growth -Continue vancomycin and Zosyn -Urology consulted and appreciated, status post cystoscopy with right retrograde ureteropyelogram, fluoroscopic interpretation, placement of 6 French by 24 similar contour double-J stent without tether  Acute kidney injury secondary  to obstructive uropathy and hydroureteronephrosis -Creatinine on admission was 2.07, has improved to 1.58 (pending labs this morning) -Baseline creatinine 0.8 in 2011 -Treatment plan as above  Acute metabolic encephalopathy -Suspect secondary to sepsis vs psych meds -CT head unremarkable for acute intercranial normalities, however showed slight interval enlargement of the ventricular system as compared with 2009 CT, it is unclear if this is secondary to progression of atrophy and volume loss versus some degree of shunt malfunction -Will discuss with neurosurgery -will obtain CXR, bladder scan, irrigate foley -ABG not revealing  -continue to monitor closely  Acute respiratory failure with hypoxia -Etiology unclear -Chest x-ray unremarkable -Currently on antibiotics as above -Continue nebulizer treatments and supplemental oxygen  Normocytic anemia -Hemoglobin 10- pending labs this morning -no recent previous labs for comparison.  Hemoglobin was 16 back in 2011.  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   Consultants Urology   Procedures  Cystoscopy with right retrograde ureteropyelogram, fluoroscopic interpretation, placement of 6 French by 24 similar contour double-J stent without tether  Antibiotics   Anti-infectives (From admission, onward)   Start     Dose/Rate  Route Frequency Ordered Stop   03/11/18 2000  vancomycin (VANCOCIN)  IVPB 1000 mg/200 mL premix  Status:  Discontinued     1,000 mg 200 mL/hr over 60 Minutes Intravenous Every 24 hours 03/10/18 2053 03/11/18 1342   03/11/18 1400  vancomycin (VANCOCIN) IVPB 750 mg/150 ml premix     750 mg 150 mL/hr over 60 Minutes Intravenous Every 12 hours 03/11/18 1342     03/11/18 0400  piperacillin-tazobactam (ZOSYN) IVPB 3.375 g     3.375 g 12.5 mL/hr over 240 Minutes Intravenous Every 8 hours 03/10/18 2053     03/10/18 2030  piperacillin-tazobactam (ZOSYN) IVPB 3.375 g     3.375 g 100 mL/hr over 30 Minutes Intravenous  Once 03/10/18 2023 03/10/18 2139   03/10/18 2030  vancomycin (VANCOCIN) IVPB 1000 mg/200 mL premix     1,000 mg 200 mL/hr over 60 Minutes Intravenous  Once 03/10/18 2023 03/10/18 2208      Subjective:   Cody [name] seen and examined today.  Patient with history of traumatic brain injury.  Unknown baseline.  Not interactive this morning.  Objective:   Vitals:   03/12/18 0600 03/12/18 0630 03/12/18 0700 03/12/18 0715  BP: 127/68 112/66 (!) 94/57   Pulse: (!) 123 (!) 127 (!) 117   Resp: 19 (!) 26 (!) 26   Temp:    97.7 F (36.5 C)  TempSrc:    Oral  SpO2: 100% 100% 96%   Weight:  78.1 kg (172 lb 2.9 oz)    Height:        Intake/Output Summary (Last 24 hours) at 03/12/2018 0754 Last data filed at 03/12/2018 0600 Gross per 24 hour  Intake 2183.02 ml  Output 935 ml  Net 1248.02 ml   Filed Weights   03/10/18 2023 03/11/18 0515 03/12/18 0630  Weight: 71.2 kg (156 lb 15.5 oz) 71.8 kg (158 lb 4.6 oz) 78.1 kg (172 lb 2.9 oz)   Exam  General: Well developed, chronically ill appearing, NAD  HEENT: NCAT, mucous membranes moist.   Neck: Supple  Cardiovascular: S1 S2 auscultated, tachycardic   Respiratory: Clear to auscultation bilaterally   Abdomen: Soft, nontender, nondistended, + bowel sounds  Extremities: warm dry without cyanosis clubbing or edema  Neuro:  Cannot assess, withdraws to painful stimuli of the lower ext  Skin: Without rashes exudates or nodules  Psych: Cannot assess  Data Reviewed: I have personally reviewed following labs and imaging studies  CBC: Recent Labs  Lab 03/10/18 2332 03/10/18 2341 03/11/18 0828  WBC 15.5*  --  19.7*  NEUTROABS 11.0*  --   --   HGB 10.0* 10.9* 10.6*  HCT 32.6* 32.0* 35.9*  MCV 86.7  --  89.1  PLT 148*  --  136*   Basic Metabolic Panel: Recent Labs  Lab 03/10/18 1946 03/10/18 2341 03/11/18 0828  NA 141 147* 146*  K 4.3 3.6 3.7  CL 110 112* 117*  CO2 21*  --  18*  GLUCOSE 106* 111* 102*  BUN 49* 48* 32*  CREATININE 2.07* 2.10* 1.58*  CALCIUM 7.7*  --  7.5*   GFR: Estimated Creatinine Clearance: 55.3 mL/min (A) (by C-G formula based on SCr of 1.58 mg/dL (H)). Liver Function Tests: Recent Labs  Lab 03/10/18 1946  AST 48*  ALT 32  ALKPHOS 115  BILITOT 1.8*  PROT 7.8  ALBUMIN 2.3*   No results for input(s): LIPASE, AMYLASE in the last 168 hours. No results for input(s): AMMONIA in the last 168 hours. Coagulation Profile: Recent Labs  Lab 03/10/18 2332  INR 1.41   Cardiac  Enzymes: No results for input(s): CKTOTAL, CKMB, CKMBINDEX, TROPONINI in the last 168 hours. BNP (last 3 results) No results for input(s): PROBNP in the last 8760 hours. HbA1C: No results for input(s): HGBA1C in the last 72 hours. CBG: Recent Labs  Lab 03/11/18 0358 03/11/18 0751 03/11/18 1130 03/11/18 1601 03/11/18 2155  GLUCAP 87 94 118* 112* 119*   Lipid Profile: No results for input(s): CHOL, HDL, LDLCALC, TRIG, CHOLHDL, LDLDIRECT in the last 72 hours. Thyroid Function Tests: No results for input(s): TSH, T4TOTAL, FREET4, T3FREE, THYROIDAB in the last 72 hours. Anemia Panel: No results for input(s): VITAMINB12, FOLATE, FERRITIN, TIBC, IRON, RETICCTPCT in the last 72 hours. Urine analysis:    Component Value Date/Time   COLORURINE YELLOW 03/10/2018 2110   APPEARANCEUR HAZY (A)  03/10/2018 2110   LABSPEC 1.023 03/10/2018 2110   PHURINE 5.0 03/10/2018 2110   GLUCOSEU NEGATIVE 03/10/2018 2110   HGBUR LARGE (A) 03/10/2018 2110   BILIRUBINUR NEGATIVE 03/10/2018 2110   KETONESUR NEGATIVE 03/10/2018 2110   PROTEINUR 100 (A) 03/10/2018 2110   UROBILINOGEN 1.0 11/20/2009 1327   NITRITE NEGATIVE 03/10/2018 2110   LEUKOCYTESUR SMALL (A) 03/10/2018 2110   Sepsis Labs: @LABRCNTIP (procalcitonin:4,lacticidven:4)  ) Recent Results (from the past 240 hour(s))  Culture, blood (Routine x 2)     Status: None (Preliminary result)   Collection Time: 03/10/18  7:46 PM  Result Value Ref Range Status   Specimen Description BLOOD RIGHT ANTECUBITAL  Final   Special Requests   Final    BOTTLES DRAWN AEROBIC AND ANAEROBIC Blood Culture adequate volume   Culture   Final    NO GROWTH < 24 HOURS Performed at [facility], [address], [place], [place] [address]    Report Status PENDING  Incomplete  Culture, blood (Routine x 2)     Status: None (Preliminary result)   Collection Time: 03/10/18  8:35 PM  Result Value Ref Range Status   Specimen Description BLOOD RIGHT HAND  Final   Special Requests   Final    BOTTLES DRAWN AEROBIC ONLY Blood Culture results may not be optimal due to an inadequate volume of blood received in culture bottles   Culture   Final    NO GROWTH < 24 HOURS Performed at [facility], [address] [place], [place] [address]    Report Status PENDING  Incomplete  Urine culture     Status: None   Collection Time: 03/10/18  9:10 PM  Result Value Ref Range Status   Specimen Description URINE, RANDOM  Final   Special Requests NONE  Final   Culture   Final    NO GROWTH Performed at [facility], [address], [place], [place] [address]    Report Status 03/12/2018 FINAL  Final  MRSA PCR Screening     Status: None   Collection Time: 03/11/18  5:15 AM  Result Value Ref Range Status   MRSA by PCR NEGATIVE NEGATIVE Final    Comment:         The GeneXpert MRSA Assay (FDA approved for NASAL specimens only), is one component of a comprehensive MRSA colonization surveillance program. It is not intended to diagnose MRSA infection nor to guide or monitor treatment for MRSA infections. Performed at [facility], [address], [place], [place] [address]       Radiology Studies: Ct Abdomen Pelvis Wo Contrast  Result Date: 03/11/2018 CLINICAL DATA:  Fever and lethargy. EXAM: CT ABDOMEN AND  PELVIS WITHOUT CONTRAST TECHNIQUE: Multidetector CT imaging of the abdomen and pelvis was performed following the standard protocol without IV contrast. COMPARISON:  None. FINDINGS: LOWER CHEST: Small pleural effusions with bibasilar atelectasis. HEPATOBILIARY: Normal hepatic contours and density. No intra- or extrahepatic biliary dilatation. Cholelithiasis without acute inflammation. PANCREAS: Normal parenchymal contours without ductal dilatation. No peripancreatic fluid collection. SPLEEN: Normal. ADRENALS/URINARY TRACT: --Adrenal glands: Left adrenal myelolipoma. --Right kidney/ureter: There is a 7 x 5 mm stone at the right ureterovesical junction causing mild hydroureteronephrosis and perinephric stranding. There are 2-3 other nonobstructing renal calculi measuring approximately 2 mm. --Left kidney/ureter: No hydronephrosis, nephroureterolithiasis, perinephric stranding or solid renal mass. --Urinary bladder: Normal for degree of distention STOMACH/BOWEL: --Stomach/Duodenum: No hiatal hernia or other gastric abnormality. Normal duodenal course. --Small bowel: No dilatation or inflammation. --Colon: No focal abnormality. --Appendix: Not visualized. No right lower quadrant inflammation or free fluid. VASCULAR/LYMPHATIC: Infrarenal IVC filter. Minimal aortic calcification. No abdominal or pelvic lymphadenopathy. REPRODUCTIVE: Normal prostate and seminal vesicles. MUSCULOSKELETAL. 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 Soft tissue  thickening extending to the skin surface superficial to the left greater trochanter (axial image 93). OTHER: None. IMPRESSION: 1. Right obstructive uropathy with 7 x 5 mm right ureterovesical junction stone causing mild hydroureteronephrosis and perinephric stranding. Multiple nonobstructing right renal calculi measuring up to 10 mm. 2. 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 This may indicate early decubitus ulcer formation. 3. Small pleural effusions and bibasilar atelectasis. 4. Aortic Atherosclerosis (ICD10-I70.0). Electronically Signed   By: Deatra RobinsonKevin  Herman M.D.   On: 03/11/2018 00:51   Ct Head Wo Contrast  Result Date: 03/11/2018 CLINICAL DATA:  Fever lethargy abnormal speech EXAM: CT HEAD WITHOUT CONTRAST TECHNIQUE: Contiguous axial images were obtained from the base of the skull through the vertex without intravenous contrast. COMPARISON:  CT brain 09/19/2007 FINDINGS: Brain: No acute territorial infarction, hemorrhage or intracranial mass is visualized. Right posterior shunt catheter with tip terminating in the anterior aspect of the left lateral ventricle. Extensive encephalomalacia involving the right cerebral hemisphere with encephalomalacia at the inferior left frontal lobe. Moderate severe atrophy and volume loss, slight progression since comparison CT. Slight interval enlargement of the dilated ventricular system, bifrontal measurement of 6 cm, as compared with 5.5 cm on the comparison study. No midline shift. Vascular: No hyperdense vessels.  Carotid vascular calcification Skull: No fracture.  Old left frontal and parietal burr holes. Sinuses/Orbits: Mucosal thickening in the ethmoid sinuses. No acute orbital abnormality. Other: None IMPRESSION: 1. Negative for hemorrhage or intracranial mass lesion. 2. Extensive encephalomalacia of the right hemisphere with encephalomalacia of the  left frontal lobe. Cortical volume loss which appears slightly progressed since the prior CT. 3. Similar appearance and positioning of right posterior shunt catheter. Slight interval enlargement of the ventricular system as compared with 2009 CT, it is unclear if this is secondary to progression of atrophy and volume loss versus some degree of shunt malfunction. Electronically Signed   By: Jasmine PangKim  Fujinaga M.D.   On: 03/11/2018 00:50   Dg Retrograde Pyelogram  Result Date: 03/11/2018 CLINICAL DATA:  Right ureter stone. EXAM: RETROGRADE PYELOGRAM COMPARISON:  CT 03/11/2018 FINDINGS: Single retrograde pyelogram image was submitted. This appears to be an image of the right ureter and right renal collecting system. Image limited due to motion. There is a filling defect in the proximal right ureter that corresponds with the recent CT findings. IMPRESSION: Stone in the  proximal right ureter. Electronically Signed   By: Richarda Overlie M.D.   On: 03/11/2018 07:41   Dg Chest Portable 1 View  Result Date: 03/10/2018 CLINICAL DATA:  Sepsis. EXAM: PORTABLE CHEST 1 VIEW COMPARISON:  Radiograph of November 20, 2009. FINDINGS: Stable cardiomediastinal silhouette. No pneumothorax or pleural effusion is noted. Stable minimal bibasilar subsegmental atelectasis is noted. Right-sided ventriculoperitoneal shunt is again noted. Bony thorax is unremarkable. IMPRESSION: Stable minimal bibasilar subsegmental atelectasis. Electronically Signed   By: Lupita Raider, M.D.   On: 03/10/2018 19:54     Scheduled Meds: . baclofen  10 mg Oral TID  . clonazePAM  0.5 mg Oral Q24H  . clonazePAM  1 mg Oral BID  . divalproex  500 mg Oral TID  . feeding supplement (PRO-STAT SUGAR FREE 64)  30 mL Oral Daily  . insulin aspart  0-9 Units Subcutaneous TID WC  . insulin detemir  10 Units Subcutaneous Daily  . mouth rinse  15 mL Mouth Rinse BID  . metoprolol tartrate  2.5 mg Intravenous Q6H  . metoprolol tartrate  50 mg Oral QHS  . metoprolol  tartrate  75 mg Oral Daily  . multivitamin with minerals  1 tablet Oral Daily  . polyethylene glycol  17 g Oral Daily  . QUEtiapine  125 mg Oral QHS  . QUEtiapine  150 mg Oral Daily  . selenium sulfide  1 application Topical Q T,Th,Sat-1800  . sertraline  100 mg Oral Daily   Continuous Infusions: . sodium chloride Stopped (03/12/18 0354)  . sodium chloride 10 mL/hr at 03/11/18 1900  . sodium chloride 75 mL/hr at 03/12/18 0600  . piperacillin-tazobactam (ZOSYN)  IV 3.375 g (03/12/18 0549)  . vancomycin 750 mg (03/12/18 0354)     LOS: 1 day   Time Spent in minutes   45 minutes  Melo Stauber D.O. on 03/12/2018 at 7:54 AM  Between 7am to 7pm - Pager - [phone]  After 7pm go to [email or web address] - password [email or web address]  And look for the night coverage person covering for me after hours  [facility] Office  [phone]

## 2018-03-13 LAB — BASIC METABOLIC PANEL
Anion gap: 11 (ref 5–15)
BUN: 41 mg/dL — ABNORMAL HIGH (ref 6–20)
CALCIUM: 8 mg/dL — AB (ref 8.9–10.3)
CHLORIDE: 121 mmol/L — AB (ref 98–111)
CO2: 23 mmol/L (ref 22–32)
CREATININE: 1.78 mg/dL — AB (ref 0.61–1.24)
GFR calc Af Amer: 49 mL/min — ABNORMAL LOW (ref >60)
GFR calc non Af Amer: 42 mL/min — ABNORMAL LOW (ref >60)
Glucose, Bld: 87 mg/dL (ref 70–99)
POTASSIUM: 3.7 mmol/L (ref 3.5–5.1)
SODIUM: 155 mmol/L — AB (ref 135–145)

## 2018-03-13 LAB — VANCOMYCIN, TROUGH: VANCOMYCIN TR: 25 ug/mL — AB (ref 15–20)

## 2018-03-13 LAB — CBC
HCT: 33.3 % — ABNORMAL LOW (ref 39.0–52.0)
Hemoglobin: 10.3 g/dL — ABNORMAL LOW (ref 13.0–17.0)
MCH: 26.5 pg (ref 26.0–34.0)
MCHC: 30.9 g/dL (ref 30.0–36.0)
MCV: 85.8 fL (ref 78.0–100.0)
PLATELETS: 143 10*3/uL — AB (ref 150–400)
RBC: 3.88 MIL/uL — AB (ref 4.22–5.81)
RDW: 17.9 % — AB (ref 11.5–15.5)
WBC: 15.5 10*3/uL — ABNORMAL HIGH (ref 4.0–10.5)

## 2018-03-13 LAB — GLUCOSE, CAPILLARY
GLUCOSE-CAPILLARY: 69 mg/dL — AB (ref 70–99)
GLUCOSE-CAPILLARY: 80 mg/dL (ref 70–99)
Glucose-Capillary: 82 mg/dL (ref 70–99)
Glucose-Capillary: 88 mg/dL (ref 70–99)
Glucose-Capillary: 107 mg/dL — ABNORMAL HIGH (ref 70–99)

## 2018-03-13 MED ORDER — DEXTROSE 5 % IV SOLN
INTRAVENOUS | Status: DC
  Administered 2018-03-13 – 2018-03-16 (×4): via INTRAVENOUS

## 2018-03-13 MED ORDER — DILTIAZEM HCL 25 MG/5ML IV SOLN
10.0000 mg | Freq: Once | INTRAVENOUS | Status: AC
  Administered 2018-03-13: 10 mg via INTRAVENOUS
  Filled 2018-03-13 (×4): qty 5

## 2018-03-13 MED ORDER — VANCOMYCIN HCL 500 MG IV SOLR
500.0000 mg | Freq: Two times a day (BID) | INTRAVENOUS | Status: DC
  Administered 2018-03-14 – 2018-03-15 (×3): 500 mg via INTRAVENOUS
  Filled 2018-03-13 (×4): qty 500

## 2018-03-13 NOTE — Progress Notes (Signed)
SLP Cancellation Note  Patient Details Name: Cody [name] MRN: [id number] DOB: 04/25/1966   Cancelled treatment:       Reason Eval/Treat Not Completed: Other (comment) Pt being cleaned by nursing - will f/u as schedule allows.   Maxcine Hamaiewonsky, Dionte Blaustein 03/13/2018, 2:42 PM  Maxcine HamLaura Paiewonsky, M.A. CCC-SLP [phone]

## 2018-03-13 NOTE — Progress Notes (Addendum)
Pharmacy Antibiotic Note  Cody [name] is a 52 y.o. male admitted on 03/10/2018 with sepsis. Patient presented with fever and worsening mental status. PMH includes HTN, DM, hypothyroidism, depression, anxiety. Pharmacy has been consulted for Vancomycin and zosyn dosing.   WBC trending down, Tmax 100.5, renal function stable.   Vancomycin trough supratherapeutic today at 25   Plan: Vancomycin 500 mg IV every 12 hours.  Goal trough 15-20 mcg/mL.  Continue Zosyn 3.375mg  q8h Recheck vancomycin trough in 4-5 doses Monitor renal function, cultures, and patient clinical status.   Height: 5\' 9"  (175.3 cm) Weight: 159 lb 6.3 oz (72.3 kg) IBW/kg (Calculated) : 70.7  Temp (24hrs), Avg:99.5 F (37.5 C), Min:98.2 F (36.8 C), Max:101.3 F (38.5 C)  Recent Labs  Lab 03/10/18 1946 03/10/18 1958 03/10/18 2332 03/10/18 2340 03/10/18 2341 03/11/18 0624 03/11/18 0828 03/12/18 0759 03/13/18 0417 03/13/18 1252  WBC  --   --  15.5*  --   --   --  19.7* 16.1* 15.5*  --   CREATININE 2.07*  --   --   --  2.10*  --  1.58* 2.22* 1.78*  --   LATICACIDVEN  --  1.13  --  1.17  --  1.6  --   --   --   --   VANCOTROUGH  --   --   --   --   --   --   --   --   --  25*    Estimated Creatinine Clearance: 49.1 mL/min (A) (by C-G formula based on SCr of 1.78 mg/dL (H)).    Not on File   Dose adjustments this admission: Vancomycin trough 25 (goal 15-20). Will decrease to Vancomycin 500 mg q12h  Microbiology results: Blood cx (7/24) >> Urine cx (7/24)>>   Thank you for allowing pharmacy to be a part of this patient's care.  Gwynneth AlbrightSara Brihany Butch, Ilda BassetPharm D PGY1 Pharmacy Resident  Phone [phone] 03/13/2018   2:51 PM

## 2018-03-13 NOTE — Progress Notes (Signed)
PROGRESS NOTE    Cody [name]  EAV:[id number] DOB: 06/18/1966 DOA: 03/10/2018 PCP: Shelbie AmmonsHaque, Imran P, MD   Brief Narrative:  HPI On 03/11/2018 by Dr. Lorretta HarpXilin Niu Cody [name] is a 52 y.o. 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  Per report, pt has garbled speech and is bed bound at baseline. Pt is noted to be more confused, very lethargic and less responsive in facililty in the past 2 days. He has fever and right sided abdominal tenderness. No nausea, vomiting, diarrhea notated. Per his sister, pt seems to have dysuria. Pt was reportedly to have dry cough. He was found to be hypoxic by EMS with SpO2 in the 70s on room air, given 2 duonebs en route, Spo2 improved to 95% on 4L [place]. Not sure if he has any chest pain. He has left sided spasmatic hemiplegia. No facial droop. He moves his right extremities. He is initially hypotensive with SBP 70s, which improved to SBP>100 after 3L NS bolus in ED.  Assessment & Plan   Severe sepsis secondary to acute pyelonephrosis secondary to obstructive uropathy -Presented with tachycardia, fever, leukocytosis, AKI and hypotension.  Hypotension has been responsive to IV fluids. -CT abdomen/pelvis showed right obstructive uropathy with 7 x 5 mm right ureterovesicular junction stone causing mild hydroureteronephrosis and perinephric stranding.  Multiple nonobstructing right renal calculi measuring up to 10 mm. -Blood cultures show no growth to date -Urine culture shows no growth -Continue vancomycin and Zosyn -Urology consulted and appreciated, status post cystoscopy with right retrograde ureteropyelogram, fluoroscopic interpretation, placement of 6 French by 24 similar contour double-J stent without tether -IR consulted for percutaneous  nephrostomy tube placement   Acute kidney injury secondary to obstructive uropathy and hydroureteronephrosis -Creatinine on admission was 2.07, has improved to 1.78 -Baseline creatinine 0.8 in 2011 -Treatment plan as above  Acute metabolic encephalopathy -Suspect secondary to sepsis vs psych meds. Unknown baseline. -CT head unremarkable for acute intercranial normalities, however showed slight interval enlargement of the ventricular system as compared with 2009 CT, it is unclear if this is secondary to progression of atrophy and volume loss versus some degree of shunt malfunction -Neurosurgery consulted and appreciated. Obtained shunt series and pending further recommendations. -ABG not revealing  -continue to monitor closely  Acute respiratory failure with hypoxia -Etiology unclear -Chest x-ray unremarkable -Currently on antibiotics as above -Continue nebulizer treatments and supplemental oxygen  Normocytic anemia -Hemoglobin 10.3 -no recent previous labs for comparison.  Hemoglobin was 16 back in 2011.  -Continue to monitor CBC  Essential hypertension -Restarted metoprolol, continue IV hydralazine as needed  Sinus tachycardia -suspect secondary to sepsis vs pain -have added on IV metoprolol along with oral metoprolol -continue IVF  Diabetes mellitus, type II -Hemoglobin A1c pending  -Continue Levemir, insulin sliding scale and CBG monitoring  Spastic hemiplegia secondary to traumatic brain injury -Continue baclofen -As above, neurosurgery consulted and appreciated  Depression and anxiety -Continue Klonopin, Seroquel, Zoloft, Depakote  DVT Prophylaxis  SCDs  Code Status: Full  Family Communication: None at bedside  Disposition Plan: Admitted.   Consultants Urology   Procedures  Cystoscopy with right retrograde ureteropyelogram, fluoroscopic interpretation, placement of 6 French by 24 similar contour double-J stent without tether  Antibiotics     Anti-infectives (From admission, onward)   Start     Dose/Rate Route Frequency Ordered Stop   03/11/18 2000  vancomycin (VANCOCIN) IVPB 1000 mg/200 mL premix  Status:  Discontinued     1,000 mg 200 mL/hr over 60 Minutes Intravenous Every 24 hours 03/10/18 2053 03/11/18 1342   03/11/18 1400  vancomycin (VANCOCIN) IVPB 750 mg/150 ml premix     750 mg 150 mL/hr over 60 Minutes Intravenous Every 12 hours 03/11/18 1342     03/11/18 0400  piperacillin-tazobactam (ZOSYN) IVPB 3.375 g     3.375 g 12.5 mL/hr over 240 Minutes Intravenous Every 8 hours 03/10/18 2053     03/10/18 2030  piperacillin-tazobactam (ZOSYN) IVPB 3.375 g     3.375 g 100 mL/hr over 30 Minutes Intravenous  Once 03/10/18 2023 03/10/18 2139   03/10/18 2030  vancomycin (VANCOCIN) IVPB 1000 mg/200 mL premix     1,000 mg 200 mL/hr over 60 Minutes Intravenous  Once 03/10/18 2023 03/10/18 2208      Subjective:   Cody [name] seen and examined today.  Patient with history of traumatic brain injury.  Unknown baseline.    Objective:   Vitals:   03/13/18 0600 03/13/18 0652 03/13/18 0754 03/13/18 1127  BP: 110/71 114/72 119/77 123/79  Pulse: (!) 107 (!) 103 (!) 110 (!) 111  Resp: (!) 21 20    Temp:  98.2 F (36.8 C) (!) 100.5 F (38.1 C) (!) 101.3 F (38.5 C)  TempSrc:  Axillary Axillary Oral  SpO2: 100% 100% (!) 89% 94%  Weight:  72.3 kg (159 lb 6.3 oz)    Height:        Intake/Output Summary (Last 24 hours) at 03/13/2018 1200 Last data filed at 03/13/2018 0919 Gross per 24 hour  Intake 890.83 ml  Output 4000 ml  Net -3109.17 ml   Filed Weights   03/12/18 0630 03/12/18 2004 03/13/18 0652  Weight: 78.1 kg (172 lb 2.9 oz) 72.3 kg (159 lb 6.3 oz) 72.3 kg (159 lb 6.3 oz)   Exam  General: Well developed, chronically ill appearing, NAD  HEENT: NCAT, mucous membranes moist.   Neck: Supple  Cardiovascular: S1 S2 auscultated, tachycardic   Respiratory: Clear to auscultation bilaterally   Abdomen: Soft,  nontender, nondistended, + bowel sounds  Extremities: warm dry without cyanosis clubbing or edema  Neuro: Cannot assess, withdraws to painful stimuli of the lower ext  Skin: Without rashes exudates or nodules  Psych: Cannot assess  Data Reviewed: I have personally reviewed following labs and imaging studies  CBC: Recent Labs  Lab 03/10/18 2332 03/10/18 2341 03/11/18 0828 03/12/18 0759 03/13/18 0417  WBC 15.5*  --  19.7* 16.1* 15.5*  NEUTROABS 11.0*  --   --   --   --   HGB 10.0* 10.9* 10.6* 9.4* 10.3*  HCT 32.6* 32.0* 35.9* 31.2* 33.3*  MCV 86.7  --  89.1 87.2 85.8  PLT 148*  --  136* 141* 143*   Basic Metabolic Panel: Recent Labs  Lab 03/10/18 1946 03/10/18 2341 03/11/18 0828 03/12/18 0759 03/13/18 0417  NA 141 147* 146* 146* 155*  K 4.3 3.6 3.7 3.5 3.7  CL 110 112* 117* 114* 121*  CO2 21*  --  18* 23 23  GLUCOSE 106* 111* 102* 101* 87  BUN 49* 48* 32* 44* 41*  CREATININE 2.07* 2.10* 1.58* 2.22* 1.78*  CALCIUM 7.7*  --  7.5* 7.7* 8.0*   GFR: Estimated Creatinine Clearance: 49.1 mL/min (A) (by C-G formula based on SCr of 1.78 mg/dL (H)). Liver Function Tests: Recent Labs  Lab 03/10/18 1946  AST 48*  ALT 32  ALKPHOS  115  BILITOT 1.8*  PROT 7.8  ALBUMIN 2.3*   No results for input(s): LIPASE, AMYLASE in the last 168 hours. No results for input(s): AMMONIA in the last 168 hours. Coagulation Profile: Recent Labs  Lab 03/10/18 2332  INR 1.41   Cardiac Enzymes: No results for input(s): CKTOTAL, CKMB, CKMBINDEX, TROPONINI in the last 168 hours. BNP (last 3 results) No results for input(s): PROBNP in the last 8760 hours. HbA1C: No results for input(s): HGBA1C in the last 72 hours. CBG: Recent Labs  Lab 03/12/18 1121 03/12/18 1619 03/12/18 2244 03/13/18 0745 03/13/18 1124  GLUCAP 78 100* 82 82 88   Lipid Profile: No results for input(s): CHOL, HDL, LDLCALC, TRIG, CHOLHDL, LDLDIRECT in the last 72 hours. Thyroid Function Tests: No results for  input(s): TSH, T4TOTAL, FREET4, T3FREE, THYROIDAB in the last 72 hours. Anemia Panel: No results for input(s): VITAMINB12, FOLATE, FERRITIN, TIBC, IRON, RETICCTPCT in the last 72 hours. Urine analysis:    Component Value Date/Time   COLORURINE YELLOW 03/10/2018 2110   APPEARANCEUR HAZY (A) 03/10/2018 2110   LABSPEC 1.023 03/10/2018 2110   PHURINE 5.0 03/10/2018 2110   GLUCOSEU NEGATIVE 03/10/2018 2110   HGBUR LARGE (A) 03/10/2018 2110   BILIRUBINUR NEGATIVE 03/10/2018 2110   KETONESUR NEGATIVE 03/10/2018 2110   PROTEINUR 100 (A) 03/10/2018 2110   UROBILINOGEN 1.0 11/20/2009 1327   NITRITE NEGATIVE 03/10/2018 2110   LEUKOCYTESUR SMALL (A) 03/10/2018 2110   Sepsis Labs: @LABRCNTIP (procalcitonin:4,lacticidven:4)  ) Recent Results (from the past 240 hour(s))  Culture, blood (Routine x 2)     Status: None (Preliminary result)   Collection Time: 03/10/18  7:46 PM  Result Value Ref Range Status   Specimen Description BLOOD RIGHT ANTECUBITAL  Final   Special Requests   Final    BOTTLES DRAWN AEROBIC AND ANAEROBIC Blood Culture adequate volume   Culture   Final    NO GROWTH 3 DAYS Performed at [facility], [address] [place], [place] [address]    Report Status PENDING  Incomplete  Culture, blood (Routine x 2)     Status: None (Preliminary result)   Collection Time: 03/10/18  8:35 PM  Result Value Ref Range Status   Specimen Description BLOOD RIGHT HAND  Final   Special Requests   Final    BOTTLES DRAWN AEROBIC ONLY Blood Culture results may not be optimal due to an inadequate volume of blood received in culture bottles   Culture   Final    NO GROWTH 3 DAYS Performed at [facility], [address] [place], [place] [address]    Report Status PENDING  Incomplete  Urine culture     Status: None   Collection Time: 03/10/18  9:10 PM  Result Value Ref Range Status   Specimen Description URINE, RANDOM  Final   Special Requests NONE  Final   Culture   Final    NO  GROWTH Performed at [facility], [address] [place], [place] [address]    Report Status 03/12/2018 FINAL  Final  MRSA PCR Screening     Status: None   Collection Time: 03/11/18  5:15 AM  Result Value Ref Range Status   MRSA by PCR NEGATIVE NEGATIVE Final    Comment:        The GeneXpert MRSA Assay (FDA approved for NASAL specimens only), is one component of a comprehensive MRSA colonization surveillance program. It is not intended to diagnose MRSA infection nor to guide or  monitor treatment for MRSA infections. Performed at [facility], [address] [place], [place] [address]       Radiology Studies: Dg Skull 1-3 Views  Result Date: 03/12/2018 CLINICAL DATA:  Recent shunt malfunction EXAM: SKULL - 1-3 VIEW COMPARISON:  09/19/2007 FINDINGS: Right-sided ventriculostomy catheter is noted extending across the midline to the left. The catheter as visualized is intact. Postsurgical changes in the cervical spine are noted. No other focal abnormality is seen. IMPRESSION: Intact shunt catheter as visualized. Electronically Signed   By: Alcide Clever M.D.   On: 03/12/2018 10:26   Dg Cervical Spine 1 View  Result Date: 03/12/2018 CLINICAL DATA:  Shunt malfunction EXAM: DG CERVICAL SPINE - 1 VIEW COMPARISON:  None. FINDINGS: Visualize shunt catheter in the right neck is intact. No acute abnormality is noted. Postsurgical changes in the cervical spine are noted. IMPRESSION: Intact shunt catheter. Electronically Signed   By: Alcide Clever M.D.   On: 03/12/2018 10:26   Dg Abd 1 View  Result Date: 03/12/2018 CLINICAL DATA:  Shunt malfunction EXAM: ABDOMEN - 1 VIEW COMPARISON:  None. FINDINGS: Shunt catheter is noted in the right upper quadrant with the tip projecting over the superior aspect of the right lobe of the liver. IVC filter is noted in place. No bony abnormality is seen. A ureteral stent is noted on the right. Radiopaque density is noted in the distal aspect of the  stent which may represent a wire fragment. Correlation with the operative findings is recommended. The superior aspect of the right ureteral stent is elongated and likely within the right kidney due to patient rotation. No other focal abnormality is noted. IMPRESSION: Intact shunt catheter in the right upper abdomen. Right ureteral stent identified. There is a metallic density identified in the distal aspect of the stent likely representing a retained wire fragment. Clinical correlation is recommended. The proximal portion of the right ureteral stent is elongated and not coiled in the normal fashion although likely present in the right collecting system. Again correlation with the operative findings is recommended. These results will be called to the ordering clinician or representative by the Radiologist Assistant, and communication documented in the PACS or zVision Dashboard. Electronically Signed   By: Alcide Clever M.D.   On: 03/12/2018 10:30   Dg Chest Port 1 View  Result Date: 03/12/2018 CLINICAL DATA:  Acute in cephalopathy EXAM: PORTABLE CHEST 1 VIEW COMPARISON:  03/10/2018 FINDINGS: Cardiac shadow is stable. The overall inspiratory effort is poor with bibasilar atelectasis right greater than left. No bony abnormality is seen. VP shunt is noted as well as postsurgical change in the cervical spine. IMPRESSION: Poor inspiratory effort with bibasilar atelectasis right greater than left. Electronically Signed   By: Alcide Clever M.D.   On: 03/12/2018 08:22     Scheduled Meds: . baclofen  10 mg Oral TID  . clonazePAM  0.5 mg Oral Q24H  . clonazePAM  1 mg Oral BID  . divalproex  500 mg Oral TID  . feeding supplement (PRO-STAT SUGAR FREE 64)  30 mL Oral Daily  . insulin aspart  0-9 Units Subcutaneous TID WC  . insulin detemir  10 Units Subcutaneous Daily  . mouth rinse  15 mL Mouth Rinse BID  . metoprolol tartrate  2.5 mg Intravenous Q6H  . metoprolol tartrate  50 mg Oral QHS  . metoprolol tartrate   75 mg Oral Daily  . multivitamin with minerals  1 tablet Oral Daily  . polyethylene glycol  17 g Oral Daily  . QUEtiapine  125 mg Oral QHS  . QUEtiapine  150 mg Oral Daily  . selenium sulfide  1 application Topical Q T,Th,Sat-1800  . sertraline  100 mg Oral Daily   Continuous Infusions: . dextrose 75 mL/hr at 03/13/18 0912  . piperacillin-tazobactam (ZOSYN)  IV 3.375 g (03/13/18 0334)  . vancomycin 750 mg (03/13/18 0116)     LOS: 2 days   Time Spent in minutes   45 minutes  Holston Oyama D.O. on 03/13/2018 at 12:00 PM  Between 7am to 7pm - Pager - [phone]  After 7pm go to [email or web address] - password [email or web address]  And look for the night coverage person covering for me after hours  [facility] Office  [phone]

## 2018-03-14 ENCOUNTER — Inpatient Hospital Stay (HOSPITAL_COMMUNITY): Payer: Medicare Other

## 2018-03-14 LAB — CBC
HCT: 29.3 % — ABNORMAL LOW (ref 39.0–52.0)
HEMOGLOBIN: 8.9 g/dL — AB (ref 13.0–17.0)
MCH: 26.1 pg (ref 26.0–34.0)
MCHC: 30.4 g/dL (ref 30.0–36.0)
MCV: 85.9 fL (ref 78.0–100.0)
Platelets: 162 10*3/uL (ref 150–400)
RBC: 3.41 MIL/uL — AB (ref 4.22–5.81)
RDW: 17.9 % — ABNORMAL HIGH (ref 11.5–15.5)
WBC: 12.9 10*3/uL — ABNORMAL HIGH (ref 4.0–10.5)

## 2018-03-14 LAB — BASIC METABOLIC PANEL
Anion gap: 8 (ref 5–15)
BUN: 33 mg/dL — ABNORMAL HIGH (ref 6–20)
CHLORIDE: 124 mmol/L — AB (ref 98–111)
CO2: 22 mmol/L (ref 22–32)
CREATININE: 1.73 mg/dL — AB (ref 0.61–1.24)
Calcium: 7.5 mg/dL — ABNORMAL LOW (ref 8.9–10.3)
GFR, EST AFRICAN AMERICAN: 51 mL/min — AB (ref >60)
GFR, EST NON AFRICAN AMERICAN: 44 mL/min — AB (ref >60)
Glucose, Bld: 104 mg/dL — ABNORMAL HIGH (ref 70–99)
POTASSIUM: 3.1 mmol/L — AB (ref 3.5–5.1)
SODIUM: 154 mmol/L — AB (ref 135–145)

## 2018-03-14 LAB — HEMOGLOBIN A1C
HEMOGLOBIN A1C: 5.4 % (ref 4.8–5.6)
MEAN PLASMA GLUCOSE: 108.28 mg/dL

## 2018-03-14 LAB — GLUCOSE, CAPILLARY
GLUCOSE-CAPILLARY: 106 mg/dL — AB (ref 70–99)
GLUCOSE-CAPILLARY: 103 mg/dL — AB (ref 70–99)
Glucose-Capillary: 102 mg/dL — ABNORMAL HIGH (ref 70–99)

## 2018-03-14 LAB — MAGNESIUM: Magnesium: 2.4 mg/dL (ref 1.7–2.4)

## 2018-03-14 MED ORDER — CEFEPIME HCL 2 G IJ SOLR
2.0000 g | INTRAMUSCULAR | Status: DC
  Administered 2018-03-14: 2 g via INTRAVENOUS
  Filled 2018-03-14 (×2): qty 2

## 2018-03-14 MED ORDER — POTASSIUM CHLORIDE CRYS ER 20 MEQ PO TBCR
40.0000 meq | EXTENDED_RELEASE_TABLET | Freq: Two times a day (BID) | ORAL | Status: AC
  Administered 2018-03-14 (×2): 40 meq via ORAL
  Filled 2018-03-14 (×2): qty 2

## 2018-03-14 MED ORDER — IOHEXOL 300 MG/ML  SOLN
100.0000 mL | Freq: Once | INTRAMUSCULAR | Status: AC | PRN
  Administered 2018-03-14: 100 mL via INTRAVENOUS

## 2018-03-14 NOTE — Progress Notes (Signed)
Temperature of 104 rectal noted. PRN Tylenol given as ordered. Temperature decreased to 102.9, rectal. Ice packs placed in groin area and axillary. Will notify provider after next VS check if Temperature remains this high.

## 2018-03-14 NOTE — Progress Notes (Signed)
23:55 Patient had an approximate 40 cc extravasation of contrast during CT A/P in left hand. Dr. Manus GunningEhinger, radiologist, examined patient post extravasation. Her recommendation was to remove IV and follow extravasation orders that have been entered. Also, attending MD should do serial exams on hand because patient has contraction of left extremities due to traumatic brain injury and she can't evaluate completely since she had not seen extremity at baseline. These were communicated to patient's nurse, Darlina RumpfMartina, RN at 23:40

## 2018-03-14 NOTE — Progress Notes (Addendum)
Pharmacy Antibiotic Note  Cody [name] is a 52 y.o. male admitted on 03/10/2018 with sepsis. Patient presented with fever and worsening mental status. PMH includes HTN, DM, hypothyroidism, depression, anxiety. Patient initially placed on vancomycin and Zosyn for sepsis. Zosyn has been changed to cefepime. Pharmacy has been consulted for Vancomycin and cefepime dosing.   WBC trending down, Tmax 103.2, renal function stable.    Plan: D/c zosyn Vancomycin 500 mg IV every 12 hours.  Goal trough 15-20 mcg/mL.  Start Cefepime 2 g every 24 hours Recheck vancomycin trough in 4-5 doses Monitor renal function, cultures, and patient clinical status.   Height: 5\' 9"  (175.3 cm) Weight: 159 lb 9.8 oz (72.4 kg) IBW/kg (Calculated) : 70.7  Temp (24hrs), Avg:100.9 F (38.3 C), Min:98.3 F (36.8 C), Max:103.2 F (39.6 C)  Recent Labs  Lab 03/10/18 1958 03/10/18 2332 03/10/18 2340 03/10/18 2341 03/11/18 0624 03/11/18 0828 03/12/18 0759 03/13/18 0417 03/13/18 1252 03/14/18 0723  WBC  --  15.5*  --   --   --  19.7* 16.1* 15.5*  --  12.9*  CREATININE  --   --   --  2.10*  --  1.58* 2.22* 1.78*  --  1.73*  LATICACIDVEN 1.13  --  1.17  --  1.6  --   --   --   --   --   VANCOTROUGH  --   --   --   --   --   --   --   --  25*  --     Estimated Creatinine Clearance: 50.5 mL/min (A) (by C-G formula based on SCr of 1.73 mg/dL (H)).    Not on File   Dose adjustments this admission: 7/27: Vancomycin trough 25 (goal 15-20)   Microbiology results: Blood cx (7/24) >> Urine cx (7/24)>>   Thank you for allowing pharmacy to be a part of this patient's care.  Gwynneth AlbrightSara Jamela Cumbo, Ilda BassetPharm D PGY1 Pharmacy Resident  Phone [phone] 03/14/2018   11:23 AM
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
## 2018-03-15 ENCOUNTER — Inpatient Hospital Stay (HOSPITAL_COMMUNITY): Payer: Medicare Other

## 2018-03-15 DIAGNOSIS — Z8249 Family history of ischemic heart disease and other diseases of the circulatory system: Secondary | ICD-10-CM

## 2018-03-15 DIAGNOSIS — K8020 Calculus of gallbladder without cholecystitis without obstruction: Secondary | ICD-10-CM

## 2018-03-15 DIAGNOSIS — E039 Hypothyroidism, unspecified: Secondary | ICD-10-CM

## 2018-03-15 DIAGNOSIS — E119 Type 2 diabetes mellitus without complications: Secondary | ICD-10-CM

## 2018-03-15 DIAGNOSIS — R509 Fever, unspecified: Secondary | ICD-10-CM

## 2018-03-15 DIAGNOSIS — Z936 Other artificial openings of urinary tract status: Secondary | ICD-10-CM

## 2018-03-15 DIAGNOSIS — I10 Essential (primary) hypertension: Secondary | ICD-10-CM

## 2018-03-15 DIAGNOSIS — N131 Hydronephrosis with ureteral stricture, not elsewhere classified: Secondary | ICD-10-CM

## 2018-03-15 DIAGNOSIS — N200 Calculus of kidney: Secondary | ICD-10-CM

## 2018-03-15 DIAGNOSIS — G8110 Spastic hemiplegia affecting unspecified side: Secondary | ICD-10-CM

## 2018-03-15 DIAGNOSIS — R918 Other nonspecific abnormal finding of lung field: Secondary | ICD-10-CM

## 2018-03-15 DIAGNOSIS — L89899 Pressure ulcer of other site, unspecified stage: Secondary | ICD-10-CM

## 2018-03-15 DIAGNOSIS — Z87820 Personal history of traumatic brain injury: Secondary | ICD-10-CM

## 2018-03-15 DIAGNOSIS — L89629 Pressure ulcer of left heel, unspecified stage: Secondary | ICD-10-CM

## 2018-03-15 LAB — COMPREHENSIVE METABOLIC PANEL
ALBUMIN: 1.6 g/dL — AB (ref 3.5–5.0)
ALK PHOS: 102 U/L (ref 38–126)
ALT: 24 U/L (ref 0–44)
ANION GAP: 7 (ref 5–15)
AST: 39 U/L (ref 15–41)
BILIRUBIN TOTAL: 0.9 mg/dL (ref 0.3–1.2)
BUN: 28 mg/dL — AB (ref 6–20)
CALCIUM: 7.6 mg/dL — AB (ref 8.9–10.3)
CO2: 25 mmol/L (ref 22–32)
Chloride: 125 mmol/L — ABNORMAL HIGH (ref 98–111)
Creatinine, Ser: 1.47 mg/dL — ABNORMAL HIGH (ref 0.61–1.24)
GFR calc Af Amer: >60 mL/min (ref >60)
GFR, EST NON AFRICAN AMERICAN: 54 mL/min — AB (ref >60)
GLUCOSE: 113 mg/dL — AB (ref 70–99)
Potassium: 3.8 mmol/L (ref 3.5–5.1)
Sodium: 157 mmol/L — ABNORMAL HIGH (ref 135–145)
TOTAL PROTEIN: 7.3 g/dL (ref 6.5–8.1)

## 2018-03-15 LAB — CULTURE, BLOOD (ROUTINE X 2)
Culture: NO GROWTH
Culture: NO GROWTH
Special Requests: ADEQUATE

## 2018-03-15 LAB — GLUCOSE, CAPILLARY
GLUCOSE-CAPILLARY: 102 mg/dL — AB (ref 70–99)
GLUCOSE-CAPILLARY: 79 mg/dL (ref 70–99)
Glucose-Capillary: 112 mg/dL — ABNORMAL HIGH (ref 70–99)
Glucose-Capillary: 111 mg/dL — ABNORMAL HIGH (ref 70–99)
Glucose-Capillary: 90 mg/dL (ref 70–99)

## 2018-03-15 LAB — CBC
HEMATOCRIT: 30.1 % — AB (ref 39.0–52.0)
HEMOGLOBIN: 9 g/dL — AB (ref 13.0–17.0)
MCH: 25.7 pg — ABNORMAL LOW (ref 26.0–34.0)
MCHC: 29.9 g/dL — ABNORMAL LOW (ref 30.0–36.0)
MCV: 86 fL (ref 78.0–100.0)
Platelets: 165 10*3/uL (ref 150–400)
RBC: 3.5 MIL/uL — ABNORMAL LOW (ref 4.22–5.81)
RDW: 18.1 % — ABNORMAL HIGH (ref 11.5–15.5)
WBC: 13.2 10*3/uL — ABNORMAL HIGH (ref 4.0–10.5)

## 2018-03-15 MED ORDER — IOPAMIDOL (ISOVUE-370) INJECTION 76%
INTRAVENOUS | Status: AC
  Filled 2018-03-15: qty 100

## 2018-03-15 MED ORDER — COLLAGENASE 250 UNIT/GM EX OINT
TOPICAL_OINTMENT | Freq: Every day | CUTANEOUS | Status: AC
  Administered 2018-03-15 – 2018-04-04 (×21): via TOPICAL
  Filled 2018-03-15: qty 30

## 2018-03-15 MED ORDER — IOPAMIDOL (ISOVUE-370) INJECTION 76%
100.0000 mL | Freq: Once | INTRAVENOUS | Status: AC | PRN
  Administered 2018-03-15: 100 mL via INTRAVENOUS

## 2018-03-15 NOTE — Progress Notes (Addendum)
Patient ID: Cody [name], male   DOB: 09/29/1965, 52 y.o.   MRN: [id number]   40 cc contrast extravasation into Left hand  While in CT yesterday 2355.  Hand shows only minimal swelling at site of extravasation. No redness; no blisters NT  Pulses ++ Contracture noted-- unable to open hand  Will recheck in am  Elevation and ice to area

## 2018-03-15 NOTE — Progress Notes (Signed)
Patient ID: Cody [name], male   DOB: 12/18/1965, 52 y.o.   MRN: [id number] Patient seems to be at his neurologic baseline he was easily arousable.  Recommend continued exhaustively work-up all other sources of fever I do not think that tapping his shunt in the setting of potentially undiagnosed infection is worthwhile I see no evidence of a shunt infection with him.  I do think there is a chance that the shunt is not draining enough.  But that would cause fevers necessarily.  I be happy to tap the shunt and assess functioning of the shunt once were comfortable with source of his fevers and adequately treating infection so that the actual shunt tap does not introduce an infection to the shunt system.
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
## 2018-03-15 NOTE — Progress Notes (Signed)
Called by CT tech to evaluate contrast extravasation in left hand.  Patient seen and examined.  Tightness in the region of extravsation, no skin changes.  Entire arm with some degree of tightness due to contracture. Good pulse and capillary refill.  Patient not responsive, did not show secondary signs of pain.   Recommend serial exams to ensure decreased tightness with post-extravasation protocol.  If any change in skin integrity should occur, recommend plastic surgery consult.    Rubye OaksMelanie Ehinger MD, radiology

## 2018-03-15 NOTE — Progress Notes (Signed)
  Speech Language Pathology Treatment: Dysphagia  Patient Details Name: Cody [name] MRN: [id number] DOB: 02/17/1966 Today's Date: [name] Time: [id number] SLP Time Calculation (min) (ACUTE ONLY): 22 min  Assessment / Plan / Recommendation Clinical Impression  Pt's sister present and reports he has been sleepy since Fri taking little po's per her knowlegde. SLP downgraded diet Fri when informed by RN that pt was sleepy and had difficulty with po's during the night. He had audible congestion (chest/pharyngeal) at baseline which continues today. CXR ordered and is pending. He continues with lethargy but woke for approximately 4 minutes after repositioning and tactile/verbal cues with cold cloth. Consumed thin liquid via straw ("only way he can get it" per sister- will continue to assess in tx) without overt s/s aspiration however limited amount given due to decreased alertness. Mild bilateral pocketing with 2 bites pudding cleared with second swallow facilitated by dry spoon. Discussed option of MBS once pt is alert enough if silent aspiration is a concern. Continue puree and thin liquids ONLY when awake/alert in upright position. Will follow.    HPI HPI: Cody [name] is a 52 y.o. 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  Chest x-ray showed bilateral basilar subsegment atelectasis. CT-abd/pelvis showed right obstructive uropathy with 7 x 5 mm right ureterovesical junction stone causing mild hydroureteronephrosis and perinephric stranding. Multiple nonobstructing right renal calculi measuring up to 10 mm. Early this am pt underwent cystoscopy, right retrograde ureteropyelogram, placement of stent. [facility] he did not aspirate or penetrate, dealyed swallow  (thin liq's and appears regular texture recommended).      SLP Plan  Continue with current plan of care       Recommendations  Diet recommendations: Thin liquid;Dysphagia 1 (puree) Liquids provided via: Cup;Straw(allowing straws now) Medication Administration: Crushed with puree Supervision: Staff to assist with self feeding;Full supervision/cueing for compensatory strategies Compensations: Minimize environmental distractions;Slow rate;Small sips/bites;Lingual sweep for clearance of pocketing Postural Changes and/or Swallow Maneuvers: Seated upright 90 degrees                Oral Care Recommendations: Oral care BID Follow up Recommendations: Other (comment)(TBD) SLP Visit Diagnosis: Dysphagia, unspecified (R13.10) Plan: Continue with current plan of care       GO                Cody [name], 11:28 AM  Cody [name] M.Ed [employer]SLP Pager [phone]

## 2018-03-15 NOTE — Consult Note (Signed)
WOC Nurse wound consult note Reason for Consult: Pressure injury to left medial heel from weight of right thigh resting upon foot due to contracture. Also left IT pressure injury, Unstageable due to increased HOB elevation for prolonged periods  Wound type: Pressure Injury POA: Yes Measurement: Left IT:  2.5cm x 3.4cm area with central wound measuring 2.5cm x 2.5cm x 0.2cm with undermining from 6-8 o'clock to 0.5cm. Small amount of maroon exudate on old dressing Left medial heel with 4cm x 3.5cm x 0.1cm wound reepithelializing in areas and contracting and scarring in others, scant serous exudate. Wound bed:As described above Drainage (amount, consistency, odor) As described above Periwound: Intact, dry Dressing procedure/placement/frequency: I will provide a mattress replacement with low air loss feature to reduce and redistribute pressure in the IT area and a Prevalon pressure redistribution heel boot for the right foot.  A topical dressing (foam) is provided for the area under pressure from the right thigh.   WOC nursing team will not follow, but will remain available to this patient, the nursing and medical teams.  Please re-consult if needed. Thanks, Ladona MowLaurie Nadiyah Zeis, MSN, RN, GNP, Hans EdenCWOCN, CWON-AP, FAAN  Pager# [phone]

## 2018-03-15 NOTE — Progress Notes (Signed)
IV access from left hand removed. Limb elevated. Warm compress applied. (Heating pack not available).

## 2018-03-15 NOTE — Progress Notes (Signed)
LE venous duplex prelim: Negative for DVT RLE. Poor imaging of LLE due to patient's leg contracted, however no obvious DVT noted. Farrel DemarkJill Eunice, RDMS, RVT

## 2018-03-15 NOTE — Progress Notes (Signed)
CSW attempted initial assessment, as patient is from nursing facility. Patient not in room and no family present. Called listed contact, Cody [name], and no answer. CSW to follow and support; full assessment to follow.  Abigail ButtsSusan Inigo Lantigua, LCSWA [phone]

## 2018-03-15 NOTE — Progress Notes (Signed)
PROGRESS NOTE    Cody RanBobby [name]  VZC:[id number] DOB: 03/14/1966 DOA: 03/10/2018 PCP: Shelbie AmmonsHaque, Imran P, MD   Brief Narrative:  HPI On 03/11/2018 by Dr. Lorretta HarpXilin Niu Cody [name] is a 52 y.o. 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  Per report, pt has garbled speech and is bed bound at baseline. Pt is noted to be more confused, very lethargic and less responsive in facililty in the past 2 days. He has fever and right sided abdominal tenderness. No nausea, vomiting, diarrhea notated. Per his sister, pt seems to have dysuria. Pt was reportedly to have dry cough. He was found to be hypoxic by EMS with SpO2 in the 70s on room air, given 2 duonebs en route, Spo2 improved to 95% on 4L [place]. Not sure if he has any chest pain. He has left sided spasmatic hemiplegia. No facial droop. He moves his right extremities. He is initially hypotensive with SBP 70s, which improved to SBP>100 after 3L NS bolus in ED.  Interim history Admitted with sepsis, thought to be secondary to pyelonephritis, however continues to spike fevers.   Assessment & Plan   Severe sepsis secondary to ?acute pyelonephrosis secondary to obstructive uropathy vs other source -Presented with tachycardia, fever, leukocytosis, AKI and hypotension.  Hypotension has been responsive to IV fluids. -CT abdomen/pelvis showed right obstructive uropathy with 7 x 5 mm right ureterovesicular junction stone causing mild hydroureteronephrosis and perinephric stranding.  Multiple nonobstructing right renal calculi measuring up to 10 mm. -Blood cultures show no growth to date -Urine culture shows no growth -was placed on vancomycin and Zosyn -Urology consulted and appreciated, status post cystoscopy with right retrograde  ureteropyelogram, fluoroscopic interpretation, placement of 6 French by 24 similar contour double-J stent without tether -Urology recommended that if there was inadequate drainage of the right renal uint, to consult IR for percutaneous nephrostomy tube placement  -Continues to spike fevers and have tachycardia. Discussed with ID, recommended CT abd/pelvis, and change zosyn to cefepime.  -Repeat CT abd/pelvis with contrast showed interval placement of right ureteral stent with 2 small stone fragment persisting at right UPJ. Mild persistent but improved right hydronephrosis. ?Gallstone. ?Pneumonia. VP shunt catheter tip in the LUQ abutting the dome of the liver, no adjacent fluid collection; no free fluid or free air in the abdomen or pelvis.  -Discussed with ID, will hold antibiotics and obtain CXR.  -If CXR looks worse, will place on unasyn for possible aspiration? -Discussed with Dr. Wynetta Emeryram, would not want to tap the shunt as this may introduce infection -Repeat blood cultures pending   Acute kidney injury secondary to obstructive uropathy and hydroureteronephrosis vs CKD -Creatinine on admission was 2.07, has improved to 1.47 -Baseline creatinine 0.8 in 2011 -Treatment plan as above  Acute metabolic encephalopathy -Suspect secondary to sepsis vs psych meds.  -CT head unremarkable for acute intercranial normalities, however showed slight interval enlargement of the ventricular system as compared with 2009 CT, it is unclear if this is secondary to progression of atrophy and volume loss versus some degree of shunt malfunction -Neurosurgery consulted and appreciated. Obtained shunt series and pending further recommendations. -ABG not revealing  -continue to monitor closely -Currently at baseline per sister  Acute respiratory failure with hypoxia -Etiology unclear -Chest x-ray unremarkable -Currently on antibiotics as above -Continue nebulizer treatments and supplemental  oxygen  Hypernatremia -was on NS,  have switched to D5W and rate increased to 158ml/hr -Na 157 -will encourage oral intake -may give a dose of IV lasix -continue to monitor BMP  Normocytic anemia -Hemoglobin 9 -no recent previous labs for comparison.  Hemoglobin was 16 back in 2011.  -Continue to monitor CBC  Essential hypertension -Continue metoprolol, continue IV hydralazine as needed  Sinus tachycardia -suspect secondary to sepsis vs pain -HR controlled, currently in the 90s -Continue IV metoprolol along with oral metoprolol -continue IVF  Diabetes mellitus, type II -Hemoglobin A1c  5.4 -Continue Levemir, insulin sliding scale and CBG monitoring  Spastic hemiplegia secondary to traumatic brain injury -Continue baclofen -As above, neurosurgery consulted and appreciated  Depression and anxiety -Continue Klonopin, Seroquel, Zoloft, Depakote  DVT Prophylaxis  SCDs  Code Status: Full  Family Communication: Discussed with sister  Disposition Plan: Admitted.   Consultants Urology  Neurosurgery Infectious disease, Dr. Luciana Axe, via phone  Procedures  Cystoscopy with right retrograde ureteropyelogram, fluoroscopic interpretation, placement of 6 French by 24 similar contour double-J stent without tether  Antibiotics   Anti-infectives (From admission, onward)   Start     Dose/Rate Route Frequency Ordered Stop   03/14/18 1430  ceFEPIme (MAXIPIME) 2 g in sodium chloride 0.9 % 100 mL IVPB  Status:  Discontinued     2 g 200 mL/hr over 30 Minutes Intravenous Every 24 hours 03/14/18 1136 03/15/18 0956   03/14/18 0100  vancomycin (VANCOCIN) 500 mg in sodium chloride 0.9 % 100 mL IVPB  Status:  Discontinued     500 mg 100 mL/hr over 60 Minutes Intravenous Every 12 hours 03/13/18 1510 03/15/18 0956   03/11/18 2000  vancomycin (VANCOCIN) IVPB 1000 mg/200 mL premix  Status:  Discontinued     1,000 mg 200 mL/hr over 60 Minutes Intravenous Every 24 hours 03/10/18 2053 03/11/18  1342   03/11/18 1400  vancomycin (VANCOCIN) IVPB 750 mg/150 ml premix  Status:  Discontinued     750 mg 150 mL/hr over 60 Minutes Intravenous Every 12 hours 03/11/18 1342 03/13/18 1510   03/11/18 0400  piperacillin-tazobactam (ZOSYN) IVPB 3.375 g  Status:  Discontinued     3.375 g 12.5 mL/hr over 240 Minutes Intravenous Every 8 hours 03/10/18 2053 03/14/18 1136   03/10/18 2030  piperacillin-tazobactam (ZOSYN) IVPB 3.375 g     3.375 g 100 mL/hr over 30 Minutes Intravenous  Once 03/10/18 2023 03/10/18 2139   03/10/18 2030  vancomycin (VANCOCIN) IVPB 1000 mg/200 mL premix     1,000 mg 200 mL/hr over 60 Minutes Intravenous  Once 03/10/18 2023 03/10/18 2208      Subjective:   Doyel Mulkern seen and examined today.  Patient with history of traumatic brain injury.    Objective:   Vitals:   03/15/18 0001 03/15/18 0410 03/15/18 0531 03/15/18 0743  BP: 121/73 122/76  111/74  Pulse: (!) 105 95  88  Resp: (!) 22 18  13   Temp: (!) 101.9 F (38.8 C) (!) 100.4 F (38 C)  98.2 F (36.8 C)  TempSrc: Rectal Rectal  Axillary  SpO2: 96% 100%  100%  Weight:   70.3 kg (154 lb 15.7 oz)   Height:        Intake/Output Summary (Last 24 hours) at 03/15/2018 1001 Last data filed at 03/15/2018 0715 Gross per 24 hour  Intake 4066.05 ml  Output 3750 ml  Net 316.05 ml   Filed Weights   03/13/18 0652 03/14/18 0508 03/15/18 0531  Weight: 72.3 kg (159 lb 6.3 oz) 72.4 kg (  159 lb 9.8 oz) 70.3 kg (154 lb 15.7 oz)   Exam  General: Well developed, chronically ill appearing, NAD  HEENT: NCAT,  mucous membranes moist.   Neck: Supple  Cardiovascular: S1 S2 auscultated, RRR  Respiratory: diminished breath sounds, no wheezing   Abdomen: Soft, nontender, nondistended, + bowel sounds  Extremities: warm dry without cyanosis clubbing or edema  Neuro: AAOx1, chronic left sided hemiplegia  Skin: Without rashes exudates or nodules  Data Reviewed: I have personally reviewed following labs and imaging  studies  CBC: Recent Labs  Lab 03/10/18 2332  03/11/18 0828 03/12/18 0759 03/13/18 0417 03/14/18 0723 03/15/18 0504  WBC 15.5*  --  19.7* 16.1* 15.5* 12.9* 13.2*  NEUTROABS 11.0*  --   --   --   --   --   --   HGB 10.0*   < > 10.6* 9.4* 10.3* 8.9* 9.0*  HCT 32.6*   < > 35.9* 31.2* 33.3* 29.3* 30.1*  MCV 86.7  --  89.1 87.2 85.8 85.9 86.0  PLT 148*  --  136* 141* 143* 162 165   < > = values in this interval not displayed.   Basic Metabolic Panel: Recent Labs  Lab 03/11/18 0828 03/12/18 0759 03/13/18 0417 03/14/18 0723 03/15/18 0504  NA 146* 146* 155* 154* 157*  K 3.7 3.5 3.7 3.1* 3.8  CL 117* 114* 121* 124* 125*  CO2 18* 23 23 22 25   GLUCOSE 102* 101* 87 104* 113*  BUN 32* 44* 41* 33* 28*  CREATININE 1.58* 2.22* 1.78* 1.73* 1.47*  CALCIUM 7.5* 7.7* 8.0* 7.5* 7.6*  MG  --   --   --  2.4  --    GFR: Estimated Creatinine Clearance: 59.1 mL/min (A) (by C-G formula based on SCr of 1.47 mg/dL (H)). Liver Function Tests: Recent Labs  Lab 03/10/18 1946 03/15/18 0504  AST 48* 39  ALT 32 24  ALKPHOS 115 102  BILITOT 1.8* 0.9  PROT 7.8 7.3  ALBUMIN 2.3* 1.6*   No results for input(s): LIPASE, AMYLASE in the last 168 hours. No results for input(s): AMMONIA in the last 168 hours. Coagulation Profile: Recent Labs  Lab 03/10/18 2332  INR 1.41   Cardiac Enzymes: No results for input(s): CKTOTAL, CKMB, CKMBINDEX, TROPONINI in the last 168 hours. BNP (last 3 results) No results for input(s): PROBNP in the last 8760 hours. HbA1C: Recent Labs    03/14/18 0723  HGBA1C 5.4   CBG: Recent Labs  Lab 03/14/18 0743 03/14/18 1128 03/14/18 1620 03/14/18 2055 03/15/18 0742  GLUCAP 102* 106* 103* 112* 111*   Lipid Profile: No results for input(s): CHOL, HDL, LDLCALC, TRIG, CHOLHDL, LDLDIRECT in the last 72 hours. Thyroid Function Tests: No results for input(s): TSH, T4TOTAL, FREET4, T3FREE, THYROIDAB in the last 72 hours. Anemia Panel: No results for input(s):  VITAMINB12, FOLATE, FERRITIN, TIBC, IRON, RETICCTPCT in the last 72 hours. Urine analysis:    Component Value Date/Time   COLORURINE YELLOW 03/10/2018 2110   APPEARANCEUR HAZY (A) 03/10/2018 2110   LABSPEC 1.023 03/10/2018 2110   PHURINE 5.0 03/10/2018 2110   GLUCOSEU NEGATIVE 03/10/2018 2110   HGBUR LARGE (A) 03/10/2018 2110   BILIRUBINUR NEGATIVE 03/10/2018 2110   KETONESUR NEGATIVE 03/10/2018 2110   PROTEINUR 100 (A) 03/10/2018 2110   UROBILINOGEN 1.0 11/20/2009 1327   NITRITE NEGATIVE 03/10/2018 2110   LEUKOCYTESUR SMALL (A) 03/10/2018 2110   Sepsis Labs: @LABRCNTIP (procalcitonin:4,lacticidven:4)  ) Recent Results (from the past 240 hour(s))  Culture, blood (Routine x 2)  Status: None (Preliminary result)   Collection Time: 03/10/18  7:46 PM  Result Value Ref Range Status   Specimen Description BLOOD RIGHT ANTECUBITAL  Final   Special Requests   Final    BOTTLES DRAWN AEROBIC AND ANAEROBIC Blood Culture adequate volume   Culture   Final    NO GROWTH 4 DAYS Performed at [facility], [address], [place], [place] [address]    Report Status PENDING  Incomplete  Culture, blood (Routine x 2)     Status: None (Preliminary result)   Collection Time: 03/10/18  8:35 PM  Result Value Ref Range Status   Specimen Description BLOOD RIGHT HAND  Final   Special Requests   Final    BOTTLES DRAWN AEROBIC ONLY Blood Culture results may not be optimal due to an inadequate volume of blood received in culture bottles   Culture   Final    NO GROWTH 4 DAYS Performed at [facility], [address] [place], [place] [address]    Report Status PENDING  Incomplete  Urine culture     Status: None   Collection Time: 03/10/18  9:10 PM  Result Value Ref Range Status   Specimen Description URINE, RANDOM  Final   Special Requests NONE  Final   Culture   Final    NO GROWTH Performed at [facility], [address] [place], [place] [address]    Report Status  03/12/2018 FINAL  Final  MRSA PCR Screening     Status: None   Collection Time: 03/11/18  5:15 AM  Result Value Ref Range Status   MRSA by PCR NEGATIVE NEGATIVE Final    Comment:        The GeneXpert MRSA Assay (FDA approved for NASAL specimens only), is one component of a comprehensive MRSA colonization surveillance program. It is not intended to diagnose MRSA infection nor to guide or monitor treatment for MRSA infections. Performed at [facility], [address], [place], [place] [address]       Radiology Studies: Ct Abdomen Pelvis W Contrast  Result Date: 03/15/2018 CLINICAL DATA:  Right-sided abdominal pain/tenderness.  Sepsis. EXAM: CT ABDOMEN AND PELVIS WITH CONTRAST TECHNIQUE: Multidetector CT imaging of the abdomen and pelvis was performed using the standard protocol following bolus administration of intravenous contrast. CONTRAST:  OMNIPAQUE IOHEXOL 300 MG/ML  SOLN CONTRAST EXTRAVASATION CONSULTATION: Type of contrast:  Isovue 300 Site of extravasation: Left hand Estimated volume of extravasation: 50 ml Area of extravasation scanned with CT? Yes PATIENT'S SIGNS AND SYMPTOMS Skin blistering/ulceration: no Decrease capillary refill: no Change in skin color: no Decreased motor function or severe tightness: Tightness present at site of extravasation, however patient with contracture and hemiplegia throughout the left upper extremity Decreased pulses distal to site of extravasation: no Altered sensation: Patient nonresponsive, no secondary signs of pain. Increasing pain or signs of increased swelling during observation: Patient nonresponsive, no secondary signs of pain. TREATMENT Observation period at site: Inpatient to returned to the floor. Limb elevation: Requested per protocol Ice packs applied: Requested per protocol Heat pads applied: Requested per protocol Plastic surgery consulted? no DOCUMENTATION AND FOLLOW-UP Site contrast extravasation forms submitted? yes Post  extravasation orders completed? yes Was additional follow up assigned to PA's? Yes Patient's questions answered? Patient nonresponsive Patient instructed to call [phone] or seek immediate medical care if symptoms progress. COMPARISON:  Abdominal radiograph 03/12/2018, CT 03/11/2018 FINDINGS: Lower chest: Patchy opacities in the right middle lobe have  increased from prior CT. Right greater than left lower lobe opacities with small pleural effusions are similar. Bilateral gynecomastia. Hepatobiliary: Lamellated gallstone in gallbladder, motion artifact at this level limits assessment for pericholecystic inflammation. No focal hepatic lesion. Pancreas: No ductal dilatation or inflammation. Spleen: Normal in size without focal abnormality. Adrenals/Urinary Tract: Left adrenal myelolipoma, unchanged. Right adrenal gland is normal. Interval placement of right ureteral stent, the proximal stent may extend into the renal parenchyma. Distal pigtail is within the urinary bladder, as seen on prior radiograph possible retained wire fragment in the distal aspect of the stent. Persistent but improved right hydroureteronephrosis with small 2 mm residual stone fragment at the ureteropelvic junction adjacent to the stent. Cyst in the anterior mid right kidney. No left hydronephrosis or obstructive uropathy. Urinary bladder is decompressed by Foley catheter. Stomach/Bowel: Stomach physiologically distended. No bowel wall thickening or inflammatory change. No bowel obstruction. Appendix not visualized, no evidence of appendicitis. Small to moderate colonic stool burden with stool distending the rectum. Vascular/Lymphatic: Mild aortic atherosclerosis. Infrarenal IVC filter in place. Small retroperitoneal lymph nodes likely reactive. No enlarged abdominal or pelvic lymph nodes. Reproductive: Prostate is unremarkable. Other: Ventriculoperitoneal shunt catheter with tip in the left upper quadrant abutting the dome of the liver. No  adjacent fluid collection. No free fluid or free air in the abdomen or pelvis. Subcutaneous edema and bilateral flanks with slight increase from prior exam. Musculoskeletal: Bony fusion of the spinous processes of the spine. Bilateral hip osteoarthritis. Previously described soft tissue track posterior to the left hip is not included in the field of view. IMPRESSION: 1. Interval placement of right ureteral stent with small 2 mm stone fragment persisting at the right ureteropelvic junction. Proximal aspect of the stent may extend into the renal parenchyma. Possible retained wire in the distal aspect of the stent as seen on prior radiograph. Mild persistent but improved right hydronephrosis. 2. Gallstone within the gallbladder, motion artifact obscures detailed gallbladder evaluation. 3. Increased patchy and ground-glass opacities in the right middle lobe from recent CT, suggesting pneumonia or aspiration. Bibasilar atelectasis and small pleural effusions are similar. 4. Contrast extravasation within the left hand. Tightness in the region of the extravasation. Patient with left hemiplegia and arm contracture as well as unresponsive which limits clinical evaluation. Recommend close clinical follow-up, as degree of background changes in the region of extravasation is unknown. Electronically Signed   By: Rubye Oaks M.D.   On: 03/15/2018 00:37     Scheduled Meds: . baclofen  10 mg Oral TID  . clonazePAM  0.5 mg Oral Q24H  . clonazePAM  1 mg Oral BID  . collagenase   Topical Daily  . divalproex  500 mg Oral TID  . feeding supplement (PRO-STAT SUGAR FREE 64)  30 mL Oral Daily  . insulin aspart  0-9 Units Subcutaneous TID WC  . insulin detemir  10 Units Subcutaneous Daily  . mouth rinse  15 mL Mouth Rinse BID  . metoprolol tartrate  2.5 mg Intravenous Q6H  . metoprolol tartrate  50 mg Oral QHS  . metoprolol tartrate  75 mg Oral Daily  . multivitamin with minerals  1 tablet Oral Daily  . polyethylene  glycol  17 g Oral Daily  . QUEtiapine  125 mg Oral QHS  . QUEtiapine  150 mg Oral Daily  . selenium sulfide  1 application Topical Q T,Th,Sat-1800  . sertraline  100 mg Oral Daily   Continuous Infusions: . dextrose 100 mL/hr at 03/15/18 0600  LOS: 4 days   Time Spent in minutes   45 minutes  Marquee Fuchs D.O. on 03/15/2018 at 10:01 AM  Between 7am to 7pm - Pager - [phone]  After 7pm go to [email or web address] - password [email or web address]  And look for the night coverage person covering for me after hours  [facility] Office  [phone]

## 2018-03-16 DIAGNOSIS — F809 Developmental disorder of speech and language, unspecified: Secondary | ICD-10-CM

## 2018-03-16 DIAGNOSIS — Z978 Presence of other specified devices: Secondary | ICD-10-CM

## 2018-03-16 LAB — BASIC METABOLIC PANEL
ANION GAP: 10 (ref 5–15)
ANION GAP: 9 (ref 5–15)
Anion gap: 6 (ref 5–15)
BUN: 32 mg/dL — AB (ref 6–20)
BUN: 32 mg/dL — AB (ref 6–20)
BUN: 32 mg/dL — ABNORMAL HIGH (ref 6–20)
CALCIUM: 7.7 mg/dL — AB (ref 8.9–10.3)
CALCIUM: 7.6 mg/dL — AB (ref 8.9–10.3)
CHLORIDE: 128 mmol/L — AB (ref 98–111)
CHLORIDE: 130 mmol/L — AB (ref 98–111)
CO2: 26 mmol/L (ref 22–32)
CO2: 28 mmol/L (ref 22–32)
CO2: 26 mmol/L (ref 22–32)
CREATININE: 1.63 mg/dL — AB (ref 0.61–1.24)
Calcium: 7.7 mg/dL — ABNORMAL LOW (ref 8.9–10.3)
Chloride: 127 mmol/L — ABNORMAL HIGH (ref 98–111)
Creatinine, Ser: 1.63 mg/dL — ABNORMAL HIGH (ref 0.61–1.24)
Creatinine, Ser: 1.66 mg/dL — ABNORMAL HIGH (ref 0.61–1.24)
GFR calc Af Amer: 55 mL/min — ABNORMAL LOW (ref >60)
GFR calc Af Amer: 55 mL/min — ABNORMAL LOW (ref >60)
GFR calc Af Amer: 54 mL/min — ABNORMAL LOW (ref >60)
GFR calc non Af Amer: 47 mL/min — ABNORMAL LOW (ref >60)
GFR calc non Af Amer: 47 mL/min — ABNORMAL LOW (ref >60)
GFR calc non Af Amer: 46 mL/min — ABNORMAL LOW (ref >60)
GLUCOSE: 95 mg/dL (ref 70–99)
Glucose, Bld: 89 mg/dL (ref 70–99)
Glucose, Bld: 92 mg/dL (ref 70–99)
POTASSIUM: 3 mmol/L — AB (ref 3.5–5.1)
Potassium: 3 mmol/L — ABNORMAL LOW (ref 3.5–5.1)
Potassium: 3.1 mmol/L — ABNORMAL LOW (ref 3.5–5.1)
SODIUM: 160 mmol/L — AB (ref 135–145)
SODIUM: 165 mmol/L — AB (ref 135–145)
Sodium: 165 mmol/L (ref 135–145)

## 2018-03-16 LAB — BASIC METABOLIC PANEL WITH GFR
Anion gap: 14 (ref 5–15)
Anion gap: 7 (ref 5–15)
BUN: 36 mg/dL — ABNORMAL HIGH (ref 6–20)
BUN: 36 mg/dL — ABNORMAL HIGH (ref 6–20)
CO2: 26 mmol/L (ref 22–32)
CO2: 26 mmol/L (ref 22–32)
Calcium: 7.4 mg/dL — ABNORMAL LOW (ref 8.9–10.3)
Calcium: 7.1 mg/dL — ABNORMAL LOW (ref 8.9–10.3)
Chloride: 124 mmol/L — ABNORMAL HIGH (ref 98–111)
Chloride: 127 mmol/L — ABNORMAL HIGH (ref 98–111)
Creatinine, Ser: 1.73 mg/dL — ABNORMAL HIGH (ref 0.61–1.24)
Creatinine, Ser: 1.78 mg/dL — ABNORMAL HIGH (ref 0.61–1.24)
GFR calc Af Amer: 51 mL/min — ABNORMAL LOW
GFR calc Af Amer: 49 mL/min — ABNORMAL LOW
GFR calc non Af Amer: 44 mL/min — ABNORMAL LOW
GFR calc non Af Amer: 42 mL/min — ABNORMAL LOW
Glucose, Bld: 136 mg/dL — ABNORMAL HIGH (ref 70–99)
Glucose, Bld: 148 mg/dL — ABNORMAL HIGH (ref 70–99)
Potassium: 3.1 mmol/L — ABNORMAL LOW (ref 3.5–5.1)
Potassium: 3.3 mmol/L — ABNORMAL LOW (ref 3.5–5.1)
Sodium: 164 mmol/L (ref 135–145)
Sodium: 160 mmol/L — ABNORMAL HIGH (ref 135–145)

## 2018-03-16 LAB — CBC
HCT: 32 % — ABNORMAL LOW (ref 39.0–52.0)
HEMOGLOBIN: 9.7 g/dL — AB (ref 13.0–17.0)
MCH: 25.9 pg — AB (ref 26.0–34.0)
MCHC: 30.3 g/dL (ref 30.0–36.0)
MCV: 85.3 fL (ref 78.0–100.0)
PLATELETS: 203 10*3/uL (ref 150–400)
RBC: 3.75 MIL/uL — ABNORMAL LOW (ref 4.22–5.81)
RDW: 18.4 % — AB (ref 11.5–15.5)
WBC: 13.7 10*3/uL — ABNORMAL HIGH (ref 4.0–10.5)

## 2018-03-16 LAB — NA AND K (SODIUM & POTASSIUM), RAND UR
POTASSIUM UR: 20 mmol/L
Sodium, Ur: 87 mmol/L

## 2018-03-16 LAB — GLUCOSE, CAPILLARY
Glucose-Capillary: 83 mg/dL (ref 70–99)
Glucose-Capillary: 81 mg/dL (ref 70–99)
Glucose-Capillary: 125 mg/dL — ABNORMAL HIGH (ref 70–99)

## 2018-03-16 LAB — OSMOLALITY, URINE: Osmolality, Ur: 304 mOsm/kg (ref 300–900)

## 2018-03-16 LAB — OSMOLALITY: Osmolality: 353 mOsm/kg (ref 275–295)

## 2018-03-16 LAB — CREATININE, URINE, RANDOM: Creatinine, Urine: 27.87 mg/dL

## 2018-03-16 MED ORDER — ACETAMINOPHEN 325 MG PO TABS
325.0000 mg | ORAL_TABLET | Freq: Once | ORAL | Status: AC
  Administered 2018-03-16: 325 mg via ORAL
  Filled 2018-03-16: qty 1

## 2018-03-16 MED ORDER — FUROSEMIDE 10 MG/ML IJ SOLN
20.0000 mg | Freq: Once | INTRAMUSCULAR | Status: AC
  Administered 2018-03-16: 20 mg via INTRAVENOUS
  Filled 2018-03-16: qty 2

## 2018-03-16 MED ORDER — DEXTROSE 5 % IV SOLN
INTRAVENOUS | Status: DC
  Administered 2018-03-16 – 2018-03-17 (×3): via INTRAVENOUS

## 2018-03-16 MED ORDER — SODIUM CHLORIDE 0.9 % IV BOLUS
500.0000 mL | Freq: Once | INTRAVENOUS | Status: AC
  Administered 2018-03-16: 500 mL via INTRAVENOUS

## 2018-03-16 MED ORDER — POTASSIUM CHLORIDE CRYS ER 20 MEQ PO TBCR
40.0000 meq | EXTENDED_RELEASE_TABLET | Freq: Once | ORAL | Status: AC
  Administered 2018-03-16: 40 meq via ORAL
  Filled 2018-03-16: qty 2

## 2018-03-16 NOTE — Progress Notes (Signed)
[facility]   Reason for visit: Follow up on fever  Interval History: fever curve trending down, remains off of antibiotics, no acute events.   No family at bedside and patient non-verbal  Physical Exam: Constitutional:  Vitals:   03/16/18 0824 03/16/18 0900  BP: 138/83   Pulse: 98   Resp:    Temp:  98.7 F (37.1 C)  SpO2:  92%   patient appears in NAD Eyes: anicteric Respiratory: Normal respiratory effort; CTA B Cardiovascular: RRR GI: soft, nt, nd  Review of Systems: Unable to be assessed due to mental status  Lab Results  Component Value Date   WBC 13.7 (H) 03/16/2018   HGB 9.7 (L) 03/16/2018   HCT 32.0 (L) 03/16/2018   MCV 85.3 03/16/2018   PLT 203 03/16/2018    Lab Results  Component Value Date   CREATININE 1.63 (H) 03/16/2018   BUN 32 (H) 03/16/2018   NA 160 (H) 03/16/2018   K 3.0 (L) 03/16/2018   CL 128 (H) 03/16/2018   CO2 26 03/16/2018    Lab Results  Component Value Date   ALT 24 03/15/2018   AST 39 03/15/2018   ALKPHOS 102 03/15/2018     Microbiology: Recent Results (from the past 240 hour(s))  Culture, blood (Routine x 2)     Status: None   Collection Time: 03/10/18  7:46 PM  Result Value Ref Range Status   Specimen Description BLOOD RIGHT ANTECUBITAL  Final   Special Requests   Final    BOTTLES DRAWN AEROBIC AND ANAEROBIC Blood Culture adequate volume   Culture   Final    NO GROWTH 5 DAYS Performed at [facility], [address] [place], [place] [address]    Report Status 03/15/2018 FINAL  Final  Culture, blood (Routine x 2)     Status: None   Collection Time: 03/10/18  8:35 PM  Result Value Ref Range Status   Specimen Description BLOOD RIGHT HAND  Final   Special Requests   Final    BOTTLES DRAWN AEROBIC ONLY Blood Culture results may not be optimal due to an inadequate volume of blood received in culture bottles   Culture   Final    NO GROWTH 5 DAYS Performed at [facility], [address], [place], [place] [address]    Report Status 03/15/2018 FINAL  Final  Urine culture     Status: None   Collection Time: 03/10/18  9:10 PM  Result Value Ref Range Status   Specimen Description URINE, RANDOM  Final   Special Requests NONE  Final   Culture   Final    NO GROWTH Performed at [facility], [address], [place], [place] [address]    Report Status 03/12/2018 FINAL  Final  MRSA PCR Screening     Status: None   Collection Time: 03/11/18  5:15 AM  Result Value Ref Range Status   MRSA by PCR NEGATIVE NEGATIVE Final    Comment:        The GeneXpert MRSA Assay (FDA approved for NASAL specimens only), is one component of a comprehensive MRSA colonization surveillance program. It is not intended to diagnose MRSA infection nor to guide or monitor treatment for MRSA infections. Performed at [facility], [address], [place], [place] [address]   Culture, blood (routine x 2)     Status: None (Preliminary result)   Collection Time: 03/14/18 11:27 AM  Result Value Ref Range Status   Specimen Description BLOOD RIGHT HAND  Final   Special Requests   Final    BOTTLES DRAWN AEROBIC ONLY Blood Culture results may not be optimal due to an inadequate volume of blood received in culture bottles   Culture   Final    NO GROWTH 1 DAY Performed at [facility], [address] [place], [place] [id number]    Report Status PENDING  Incomplete  Culture, blood (routine x 2)     Status: None (Preliminary result)   Collection Time: 03/14/18 11:31 AM  Result Value Ref Range Status   Specimen Description BLOOD RIGHT WRIST  Final   Special Requests   Final    BOTTLES DRAWN AEROBIC ONLY Blood Culture adequate volume   Culture   Final    NO GROWTH 1 DAY Performed at [facility], [address] [place], [place] [address]    Report Status PENDING  Incomplete    Impression/Plan:  1. Fever - improving off of treatment.  Possible medication-induced.  Will  continue to monitor off of antibiotics.  2.  Shunt - appreciate Dr. Lonie Peakram's evaluation.  No tap indicated at this time.    3. ? Aspiration - CXR not concerning.

## 2018-03-16 NOTE — Progress Notes (Signed)
PROGRESS NOTE    Cody [name] Weider  MVH:[id number] DOB: 05/11/1966 DOA: 03/10/2018 PCP: Shelbie AmmonsHaque, Imran P, MD   Brief Narrative:  HPI On 03/11/2018 by Dr. Lorretta HarpXilin Niu Cody [name] Hampe is a 52 y.o. 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  Per report, pt has garbled speech and is bed bound at baseline. Pt is noted to be more confused, very lethargic and less responsive in facililty in the past 2 days. He has fever and right sided abdominal tenderness. No nausea, vomiting, diarrhea notated. Per his sister, pt seems to have dysuria. Pt was reportedly to have dry cough. He was found to be hypoxic by EMS with SpO2 in the 70s on room air, given 2 duonebs en route, Spo2 improved to 95% on 4L [place]. Not sure if he has any chest pain. He has left sided spasmatic hemiplegia. No facial droop. He moves his right extremities. He is initially hypotensive with SBP 70s, which improved to SBP>100 after 3L NS bolus in ED.  Interim history Admitted with sepsis, thought to be secondary to pyelonephritis, however continues to spike fevers. ID consulted.   Assessment & Plan   Severe sepsis secondary to ?acute pyelonephrosis secondary to obstructive uropathy vs other source with Fever  -Presented with tachycardia, fever, leukocytosis, AKI and hypotension.  Hypotension has been responsive to IV fluids. -CT abdomen/pelvis showed right obstructive uropathy with 7 x 5 mm right ureterovesicular junction stone causing mild hydroureteronephrosis and perinephric stranding.  Multiple nonobstructing right renal calculi measuring up to 10 mm. -Blood cultures 03/10/2018 show no growth to date -Urine culture shows no growth -was placed on vancomycin and Zosyn -Urology consulted and appreciated, status post  cystoscopy with right retrograde ureteropyelogram, fluoroscopic interpretation, placement of 6 French by 24 similar contour double-J stent without tether -Urology recommended that if there was inadequate drainage of the right renal uint, to consult IR for percutaneous nephrostomy tube placement  -Continues to spike fevers and have tachycardia. Discussed with ID, recommended CT abd/pelvis, and change zosyn to cefepime.  -Repeat CT abd/pelvis with contrast showed interval placement of right ureteral stent with 2 small stone fragment persisting at right UPJ. Mild persistent but improved right hydronephrosis. ?Gallstone. ?Pneumonia. VP shunt catheter tip in the LUQ abutting the dome of the liver, no adjacent fluid collection; no free fluid or free air in the abdomen or pelvis.  -discontinued antibiotics  -CXR showed RUL infiltrate  -CTA chest obtained; no acute pulmonary embolism.  Dense focal areas of airspace filling throughout the lungs bilaterally, likely infectious. -lower ext doppler negative for DVT -Infectious disease consulted and appreciated, agreed with holding antibiotics.  Discussed findings of CT a with Dr. Luciana Axeomer- recommended continuing to monitor patient off of antibiotics.  -Discussed with Dr. Wynetta Emeryram, would not want to tap the shunt as this may introduce infection -Repeat blood cultures 03/14/2018 showed no growth to date  Acute kidney injury secondary to obstructive uropathy and hydroureteronephrosis vs CKD -Creatinine on admission was 2.07, has improved to 1.63 -Baseline creatinine 0.8 in 2011 -Treatment plan as above  Acute metabolic encephalopathy -Suspect secondary to sepsis vs psych meds.  -CT head unremarkable for acute intercranial normalities, however showed slight interval enlargement of the ventricular system as compared with 2009 CT, it is unclear if this is secondary to progression of atrophy and volume loss versus some degree of shunt malfunction -Neurosurgery consulted  and  appreciated. Obtained shunt series and pending further recommendations. -ABG not revealing  -continue to monitor closely -Currently at baseline per sister  Acute respiratory failure with hypoxia -Etiology unclear -CXR and CTA chest as above -Continue nebulizer treatments and supplemental oxygen  Hypernatremia -was on NS, have switched to D5W and rate increased to 186ml/hr -despite switching IVF, sodium continued to rise- however, it was noted that patient was not palced on D5W, instead he was still on normal saline -will encourage oral intake -have ordered a dose of lasix, urine electrolytes -continue to monitor BMP- repeat at 12 pm -if no improvement, may consult nephrology  Normocytic anemia -Hemoglobin 9.7 -no recent previous labs for comparison.  Hemoglobin was 16 back in 2011.  -Continue to monitor CBC  Essential hypertension -Continue metoprolol, continue IV hydralazine as needed  Sinus tachycardia -suspect secondary to sepsis vs pain -HR controlled, currently in the 90s -Continue IV metoprolol along with oral metoprolol  Diabetes mellitus, type II -Hemoglobin A1c  5.4 -Continue Levemir, insulin sliding scale and CBG monitoring  Spastic hemiplegia secondary to traumatic brain injury -Continue baclofen -As above, neurosurgery consulted and appreciated  Depression and anxiety -Continue Klonopin, Seroquel, Zoloft, Depakote  DVT Prophylaxis  SCDs  Code Status: Full  Family Communication: Discussed with sister via phone  Disposition Plan: Admitted. Pending improvement. Dispo SNF  Consultants Urology  Neurosurgery Infectious disease, Dr. Luciana Axe, via phone  Procedures  Cystoscopy with right retrograde ureteropyelogram, fluoroscopic interpretation, placement of 6 French by 24 similar contour double-J stent without tether  Antibiotics   Anti-infectives (From admission, onward)   Start     Dose/Rate Route Frequency Ordered Stop   03/14/18 1430  ceFEPIme  (MAXIPIME) 2 g in sodium chloride 0.9 % 100 mL IVPB  Status:  Discontinued     2 g 200 mL/hr over 30 Minutes Intravenous Every 24 hours 03/14/18 1136 03/15/18 0956   03/14/18 0100  vancomycin (VANCOCIN) 500 mg in sodium chloride 0.9 % 100 mL IVPB  Status:  Discontinued     500 mg 100 mL/hr over 60 Minutes Intravenous Every 12 hours 03/13/18 1510 03/15/18 0956   03/11/18 2000  vancomycin (VANCOCIN) IVPB 1000 mg/200 mL premix  Status:  Discontinued     1,000 mg 200 mL/hr over 60 Minutes Intravenous Every 24 hours 03/10/18 2053 03/11/18 1342   03/11/18 1400  vancomycin (VANCOCIN) IVPB 750 mg/150 ml premix  Status:  Discontinued     750 mg 150 mL/hr over 60 Minutes Intravenous Every 12 hours 03/11/18 1342 03/13/18 1510   03/11/18 0400  piperacillin-tazobactam (ZOSYN) IVPB 3.375 g  Status:  Discontinued     3.375 g 12.5 mL/hr over 240 Minutes Intravenous Every 8 hours 03/10/18 2053 03/14/18 1136   03/10/18 2030  piperacillin-tazobactam (ZOSYN) IVPB 3.375 g     3.375 g 100 mL/hr over 30 Minutes Intravenous  Once 03/10/18 2023 03/10/18 2139   03/10/18 2030  vancomycin (VANCOCIN) IVPB 1000 mg/200 mL premix     1,000 mg 200 mL/hr over 60 Minutes Intravenous  Once 03/10/18 2023 03/10/18 2208      Subjective:   Apolo Cutshaw seen and examined today.  Patient with history of traumatic brain injury.    Objective:   Vitals:   03/15/18 2346 03/16/18 0427 03/16/18 0824 03/16/18 0900  BP: 112/75 124/77 138/83   Pulse: 92 93 98   Resp: 18 (!) 22    Temp: 99.1 F (37.3 C) 100.2 F (37.9 C)  98.7 F (37.1 C)  TempSrc:  Axillary Axillary  Oral  SpO2: 97% 95%  92%  Weight:  66.2 kg (146 lb)    Height:        Intake/Output Summary (Last 24 hours) at 03/16/2018 1118 Last data filed at 03/16/2018 0905 Gross per 24 hour  Intake 2721.71 ml  Output 5900 ml  Net -3178.29 ml   Filed Weights   03/14/18 0508 03/15/18 0531 03/16/18 0427  Weight: 72.4 kg (159 lb 9.8 oz) 70.3 kg (154 lb 15.7 oz)  66.2 kg (146 lb)   Exam (no change from physical exam on 03/15/2018)  General: Well developed, chronically ill appearing, NAD  HEENT: NCAT,  mucous membranes moist.   Neck: Supple  Cardiovascular: S1 S2 auscultated, RRR  Respiratory: diminished breath sounds, no wheezing   Abdomen: Soft, nontender, nondistended, + bowel sounds  Extremities: warm dry without cyanosis clubbing or edema  Neuro: AAOx1, chronic left sided hemiplegia  Skin: Without rashes exudates or nodules  Data Reviewed: I have personally reviewed following labs and imaging studies  CBC: Recent Labs  Lab 03/10/18 2332  03/12/18 0759 03/13/18 0417 03/14/18 0723 03/15/18 0504 03/16/18 0511  WBC 15.5*   < > 16.1* 15.5* 12.9* 13.2* 13.7*  NEUTROABS 11.0*  --   --   --   --   --   --   HGB 10.0*   < > 9.4* 10.3* 8.9* 9.0* 9.7*  HCT 32.6*   < > 31.2* 33.3* 29.3* 30.1* 32.0*  MCV 86.7   < > 87.2 85.8 85.9 86.0 85.3  PLT 148*   < > 141* 143* 162 165 203   < > = values in this interval not displayed.   Basic Metabolic Panel: Recent Labs  Lab 03/12/18 0759 03/13/18 0417 03/14/18 0723 03/15/18 0504 03/16/18 0511  NA 146* 155* 154* 157* 160*  K 3.5 3.7 3.1* 3.8 3.0*  CL 114* 121* 124* 125* 128*  CO2 23 23 22 25 26   GLUCOSE 101* 87 104* 113* 89  BUN 44* 41* 33* 28* 32*  CREATININE 2.22* 1.78* 1.73* 1.47* 1.63*  CALCIUM 7.7* 8.0* 7.5* 7.6* 7.7*  MG  --   --  2.4  --   --    GFR: Estimated Creatinine Clearance: 50.2 mL/min (A) (by C-G formula based on SCr of 1.63 mg/dL (H)). Liver Function Tests: Recent Labs  Lab 03/10/18 1946 03/15/18 0504  AST 48* 39  ALT 32 24  ALKPHOS 115 102  BILITOT 1.8* 0.9  PROT 7.8 7.3  ALBUMIN 2.3* 1.6*   No results for input(s): LIPASE, AMYLASE in the last 168 hours. No results for input(s): AMMONIA in the last 168 hours. Coagulation Profile: Recent Labs  Lab 03/10/18 2332  INR 1.41   Cardiac Enzymes: No results for input(s): CKTOTAL, CKMB, CKMBINDEX,  TROPONINI in the last 168 hours. BNP (last 3 results) No results for input(s): PROBNP in the last 8760 hours. HbA1C: Recent Labs    03/14/18 0723  HGBA1C 5.4   CBG: Recent Labs  Lab 03/15/18 0742 03/15/18 1218 03/15/18 1734 03/15/18 2221 03/16/18 0742  GLUCAP 111* 102* 79 90 83   Lipid Profile: No results for input(s): CHOL, HDL, LDLCALC, TRIG, CHOLHDL, LDLDIRECT in the last 72 hours. Thyroid Function Tests: No results for input(s): TSH, T4TOTAL, FREET4, T3FREE, THYROIDAB in the last 72 hours. Anemia Panel: No results for input(s): VITAMINB12, FOLATE, FERRITIN, TIBC, IRON, RETICCTPCT in the last 72 hours. Urine analysis:    Component Value Date/Time   COLORURINE YELLOW 03/10/2018 2110  APPEARANCEUR HAZY (A) 03/10/2018 2110   LABSPEC 1.023 03/10/2018 2110   PHURINE 5.0 03/10/2018 2110   GLUCOSEU NEGATIVE 03/10/2018 2110   HGBUR LARGE (A) 03/10/2018 2110   BILIRUBINUR NEGATIVE 03/10/2018 2110   KETONESUR NEGATIVE 03/10/2018 2110   PROTEINUR 100 (A) 03/10/2018 2110   UROBILINOGEN 1.0 11/20/2009 1327   NITRITE NEGATIVE 03/10/2018 2110   LEUKOCYTESUR SMALL (A) 03/10/2018 2110   Sepsis Labs: @LABRCNTIP (procalcitonin:4,lacticidven:4)  ) Recent Results (from the past 240 hour(s))  Culture, blood (Routine x 2)     Status: None   Collection Time: 03/10/18  7:46 PM  Result Value Ref Range Status   Specimen Description BLOOD RIGHT ANTECUBITAL  Final   Special Requests   Final    BOTTLES DRAWN AEROBIC AND ANAEROBIC Blood Culture adequate volume   Culture   Final    NO GROWTH 5 DAYS Performed at [facility], [address], [place], [place] [address]    Report Status 03/15/2018 FINAL  Final  Culture, blood (Routine x 2)     Status: None   Collection Time: 03/10/18  8:35 PM  Result Value Ref Range Status   Specimen Description BLOOD RIGHT HAND  Final   Special Requests   Final    BOTTLES DRAWN AEROBIC ONLY Blood Culture results may not be optimal due to an  inadequate volume of blood received in culture bottles   Culture   Final    NO GROWTH 5 DAYS Performed at [facility], [address] [place], [place] [address]    Report Status 03/15/2018 FINAL  Final  Urine culture     Status: None   Collection Time: 03/10/18  9:10 PM  Result Value Ref Range Status   Specimen Description URINE, RANDOM  Final   Special Requests NONE  Final   Culture   Final    NO GROWTH Performed at [facility], [address], [place], [place] [address]    Report Status 03/12/2018 FINAL  Final  MRSA PCR Screening     Status: None   Collection Time: 03/11/18  5:15 AM  Result Value Ref Range Status   MRSA by PCR NEGATIVE NEGATIVE Final    Comment:        The GeneXpert MRSA Assay (FDA approved for NASAL specimens only), is one component of a comprehensive MRSA colonization surveillance program. It is not intended to diagnose MRSA infection nor to guide or monitor treatment for MRSA infections. Performed at [facility], [address], [place], [place] [address]   Culture, blood (routine x 2)     Status: None (Preliminary result)   Collection Time: 03/14/18 11:27 AM  Result Value Ref Range Status   Specimen Description BLOOD RIGHT HAND  Final   Special Requests   Final    BOTTLES DRAWN AEROBIC ONLY Blood Culture results may not be optimal due to an inadequate volume of blood received in culture bottles   Culture   Final    NO GROWTH 1 DAY Performed at [facility], [address], [place], [place] [address]    Report Status PENDING  Incomplete  Culture, blood (routine x 2)     Status: None (Preliminary result)   Collection Time: 03/14/18 11:31 AM  Result Value Ref Range Status   Specimen Description BLOOD RIGHT WRIST  Final   Special Requests   Final    BOTTLES DRAWN AEROBIC ONLY Blood Culture adequate volume   Culture  Final    NO GROWTH 1 DAY Performed at [facility], [address], [place], [place] [address]      Report Status PENDING  Incomplete      Radiology Studies: Ct Angio Chest Pe W Or Wo Contrast  Result Date: 03/16/2018 CLINICAL DATA:  Ct chest PE. He had audible congestion (chest/pharyngeal) at baseline which continues today. CXR ordered and is pending. Lethargy. EXAM: CT ANGIOGRAPHY CHEST WITH CONTRAST TECHNIQUE: Multidetector CT imaging of the chest was performed using the standard protocol during bolus administration of intravenous contrast. Multiplanar CT image reconstructions and MIPs were obtained to evaluate the vascular anatomy. CONTRAST:  ISOVUE-370 IOPAMIDOL (ISOVUE-370) INJECTION 76% COMPARISON:  Chest x-ray on 03/15/2018 FINDINGS: Cardiovascular: Pulmonary arteries are moderately well opacified. There is significant streak artifact from arms at patient's side. There is no acute pulmonary embolus. Pulmonary artery is enlarged, 3.9 centimeters. The heart is mildly enlarged. Bovine arch anatomy. No significant thoracic aortic atherosclerosis or aneurysm. Mediastinum/Nodes: The visualized portion of the thyroid gland has a normal appearance. Esophagus is normal in appearance. No significant mediastinal, hilar, or axillary adenopathy. Lungs/Pleura: There are dense areas of airspace filling throughout the lungs bilaterally. Bilateral small pleural effusions. Upper Abdomen: No acute abnormality. Musculoskeletal: Degenerative changes are seen in thoracic spine. Bilateral gynecomastia. Review of the MIP images confirms the above findings. IMPRESSION: 1. Moderate opacification of pulmonary arteries. No acute pulmonary embolus. Enlargement of pulmonary artery, consistent arterial hypertension. 2. Dense focal areas of airspace filling throughout the lungs bilaterally, likely infectious. 3. Small bilateral pleural effusions. 4. Bovine arch anatomy incidentally noted. Electronically Signed   By: Norva Pavlov M.D.   On: 03/16/2018 10:42   Ct Abdomen Pelvis W Contrast  Result Date:  03/15/2018 CLINICAL DATA:  Right-sided abdominal pain/tenderness.  Sepsis. EXAM: CT ABDOMEN AND PELVIS WITH CONTRAST TECHNIQUE: Multidetector CT imaging of the abdomen and pelvis was performed using the standard protocol following bolus administration of intravenous contrast. CONTRAST:  OMNIPAQUE IOHEXOL 300 MG/ML  SOLN CONTRAST EXTRAVASATION CONSULTATION: Type of contrast:  Isovue 300 Site of extravasation: Left hand Estimated volume of extravasation: 50 ml Area of extravasation scanned with CT? Yes PATIENT'S SIGNS AND SYMPTOMS Skin blistering/ulceration: no Decrease capillary refill: no Change in skin color: no Decreased motor function or severe tightness: Tightness present at site of extravasation, however patient with contracture and hemiplegia throughout the left upper extremity Decreased pulses distal to site of extravasation: no Altered sensation: Patient nonresponsive, no secondary signs of pain. Increasing pain or signs of increased swelling during observation: Patient nonresponsive, no secondary signs of pain. TREATMENT Observation period at site: Inpatient to returned to the floor. Limb elevation: Requested per protocol Ice packs applied: Requested per protocol Heat pads applied: Requested per protocol Plastic surgery consulted? no DOCUMENTATION AND FOLLOW-UP Site contrast extravasation forms submitted? yes Post extravasation orders completed? yes Was additional follow up assigned to PA's? Yes Patient's questions answered? Patient nonresponsive Patient instructed to call [phone] or seek immediate medical care if symptoms progress. COMPARISON:  Abdominal radiograph 03/12/2018, CT 03/11/2018 FINDINGS: Lower chest: Patchy opacities in the right middle lobe have increased from prior CT. Right greater than left lower lobe opacities with small pleural effusions are similar. Bilateral gynecomastia. Hepatobiliary: Lamellated gallstone in gallbladder, motion artifact at this level limits assessment for  pericholecystic inflammation. No focal hepatic lesion. Pancreas: No ductal dilatation or inflammation. Spleen: Normal in size without focal abnormality. Adrenals/Urinary Tract: Left adrenal myelolipoma, unchanged. Right adrenal gland is normal. Interval placement  of right ureteral stent, the proximal stent may extend into the renal parenchyma. Distal pigtail is within the urinary bladder, as seen on prior radiograph possible retained wire fragment in the distal aspect of the stent. Persistent but improved right hydroureteronephrosis with small 2 mm residual stone fragment at the ureteropelvic junction adjacent to the stent. Cyst in the anterior mid right kidney. No left hydronephrosis or obstructive uropathy. Urinary bladder is decompressed by Foley catheter. Stomach/Bowel: Stomach physiologically distended. No bowel wall thickening or inflammatory change. No bowel obstruction. Appendix not visualized, no evidence of appendicitis. Small to moderate colonic stool burden with stool distending the rectum. Vascular/Lymphatic: Mild aortic atherosclerosis. Infrarenal IVC filter in place. Small retroperitoneal lymph nodes likely reactive. No enlarged abdominal or pelvic lymph nodes. Reproductive: Prostate is unremarkable. Other: Ventriculoperitoneal shunt catheter with tip in the left upper quadrant abutting the dome of the liver. No adjacent fluid collection. No free fluid or free air in the abdomen or pelvis. Subcutaneous edema and bilateral flanks with slight increase from prior exam. Musculoskeletal: Bony fusion of the spinous processes of the spine. Bilateral hip osteoarthritis. Previously described soft tissue track posterior to the left hip is not included in the field of view. IMPRESSION: 1. Interval placement of right ureteral stent with small 2 mm stone fragment persisting at the right ureteropelvic junction. Proximal aspect of the stent may extend into the renal parenchyma. Possible retained wire in the distal  aspect of the stent as seen on prior radiograph. Mild persistent but improved right hydronephrosis. 2. Gallstone within the gallbladder, motion artifact obscures detailed gallbladder evaluation. 3. Increased patchy and ground-glass opacities in the right middle lobe from recent CT, suggesting pneumonia or aspiration. Bibasilar atelectasis and small pleural effusions are similar. 4. Contrast extravasation within the left hand. Tightness in the region of the extravasation. Patient with left hemiplegia and arm contracture as well as unresponsive which limits clinical evaluation. Recommend close clinical follow-up, as degree of background changes in the region of extravasation is unknown. Electronically Signed   By: Rubye Oaks M.D.   On: 03/15/2018 00:37   Dg Chest Port 1 View  Result Date: 03/15/2018 CLINICAL DATA:  Fever EXAM: PORTABLE CHEST 1 VIEW COMPARISON:  03/12/2018 FINDINGS: Overall improved aeration especially in the lung bases. Progression of right upper lobe infiltrate. Improved bibasilar airspace disease. Improved lung volume. No effusion identified. Shunt tubing in the right chest again noted. IMPRESSION: Improved lung volume and improved aeration of the lungs bilaterally. Progression of right upper lobe infiltrate. Electronically Signed   By: Marlan Palau M.D.   On: 03/15/2018 11:57     Scheduled Meds: . baclofen  10 mg Oral TID  . clonazePAM  0.5 mg Oral Q24H  . clonazePAM  1 mg Oral BID  . collagenase   Topical Daily  . divalproex  500 mg Oral TID  . feeding supplement (PRO-STAT SUGAR FREE 64)  30 mL Oral Daily  . insulin aspart  0-9 Units Subcutaneous TID WC  . insulin detemir  10 Units Subcutaneous Daily  . mouth rinse  15 mL Mouth Rinse BID  . metoprolol tartrate  2.5 mg Intravenous Q6H  . metoprolol tartrate  50 mg Oral QHS  . metoprolol tartrate  75 mg Oral Daily  . multivitamin with minerals  1 tablet Oral Daily  . polyethylene glycol  17 g Oral Daily  . QUEtiapine   125 mg Oral QHS  . QUEtiapine  150 mg Oral Daily  . selenium sulfide  1 application  Topical Q T,Th,Sat-1800  . sertraline  100 mg Oral Daily   Continuous Infusions:    LOS: 5 days   Time Spent in minutes   30 minutes  Layli Capshaw D.O. on 03/16/2018 at 11:18 AM  Between 7am to 7pm - Pager - [phone]  After 7pm go to [email or web address] - password [email or web address]  And look for the night coverage person covering for me after hours  [facility] Office  [phone]

## 2018-03-16 NOTE — Progress Notes (Signed)
Patient ID: Cody [name], male   DOB: 01/21/1966, 52 y.o.   MRN: [id number]  Rt hand Ct contrast extravasation 7/29 2355  Right hand is less red today No swelling noted at extravasation site NT +Pulses  Rad to sign off Call if need us

## 2018-03-16 NOTE — Plan of Care (Signed)
  Problem: Elimination: Goal: Will not experience complications related to bowel motility Outcome: Progressing Note:  No complications r/t bowel motility noted.   Problem: Pain Managment: Goal: General experience of comfort will improve Outcome: Progressing Note:  Comfort improved with repositioning and changing to an air mattress.

## 2018-03-16 NOTE — Care Management Important Message (Signed)
Important Message  Patient Details  Name: Rodrigo RanBobby Ostrovsky MRN: [id number] Date of Birth: 02/06/1966   Medicare Important Message Given:  Yes    Bernadette HoitShoffner, Tayshawn Purnell Coleman 03/16/2018, 11:35 AM

## 2018-03-16 NOTE — Progress Notes (Signed)
The patient developed a fever this afternoon of 102.9 rectally. Tylenol given and RN will continue to monitor the patient closely.   Sheppard Evensina Seng Fouts RN

## 2018-03-16 NOTE — Progress Notes (Signed)
CRITICAL VALUE ALERT  Critical Value:  NA 165  Date & Time Notied:  7/30; 1150  Provider Notified: Mikhail  Orders Received/Actions taken: I will continue to monitor the patient closely.   Sheppard Evensina Kensly Bowmer RN

## 2018-03-16 NOTE — [place] FL2 (Signed)
[place] MEDICAID FL2 LEVEL OF CARE SCREENING TOOL     IDENTIFICATION  Patient Name: Cody [name] Birthdate: November 03, 1965 Sex: male Admission Date (Current Location): 03/10/2018  [facility] and [place] Number:  Producer, television/film/video and Address:  [facility] [place]. [facility], [address] [place], [place] [address]      Provider Number: [id number]  Attending Physician Name and Address:  Edsel Petrin, DO  Relative Name and Phone Number:  Jorene Minors, sister, [phone]    Current Level of Care: Hospital Recommended Level of Care: Skilled Nursing Facility Prior Approval Number:    Date Approved/Denied:   PASRR Number: [id number]  Discharge Plan: SNF    Current Diagnoses: Patient Active Problem List   Diagnosis Date Noted  . Pressure injury of skin 03/12/2018  . Sepsis (HCC) 03/11/2018  . AKI (acute kidney injury) (HCC) 03/11/2018  . Abdominal tenderness 03/11/2018  . Acute pyelonephritis 03/11/2018  . Hydroureteronephrosis-right 03/11/2018  . 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 Acute metabolic encephalopathy 03/11/2018  . Acute respiratory failure with hypoxia (HCC) 03/11/2018  . Depression   . Diabetes mellitus without complication (HCC)   . Hypertension   . Spastic hemiplegia (HCC)   . Altered mental status     Orientation RESPIRATION BLADDER Height & Weight     Self  O2(nasal cannula 2L) Incontinent, Indwelling catheter Weight: 146 lb (66.2 kg) Height:  5\' 9"  (175.3 cm)  BEHAVIORAL SYMPTOMS/MOOD NEUROLOGICAL BOWEL NUTRITION STATUS      Incontinent Diet(dysphagia I; please see DC summary)  AMBULATORY STATUS COMMUNICATION OF NEEDS Skin   (baseline) Verbally PU Stage and Appropriate Care(PU II L ankle, PU II L hip)                       Personal Care Assistance Level of Assistance  Bathing, Feeding, Dressing(baseline)           Functional Limitations Info  Sight, Hearing, Speech Sight Info: Adequate Hearing  Info: Adequate Speech Info: Impaired    SPECIAL CARE FACTORS FREQUENCY                       Contractures Contractures Info: Not present    Additional Factors Info  Code Status, Allergies, Psychotropic, Insulin Sliding Scale Code Status Info: Full Allergies Info: Not on file Psychotropic Info: klonopin, divalproex, seroquel, zoloft Insulin Sliding Scale Info: novolog 3x/day with meals; levemir daily       Current Medications (03/16/2018):  This is the current hospital active medication list Current Facility-Administered Medications  Medication Dose Route Frequency Provider Last Rate Last Dose  . acetaminophen (TYLENOL) tablet 650 mg  650 mg Oral Q6H PRN Edsel Petrin, DO   650 mg at 03/15/18 3875  . albuterol (PROVENTIL) (2.5 MG/3ML) 0.083% nebulizer solution 2.5 mg  2.5 mg Nebulization Q4H PRN [name], MD      . baclofen (LIORESAL) tablet 10 mg  10 mg Oral TID [name], MD   10 mg at 03/16/18 0827  . clonazePAM (KLONOPIN) tablet 0.5 mg  0.5 mg Oral Q24H Lorretta Harp, MD   0.5 mg at 03/14/18 1439  . clonazePAM (KLONOPIN) tablet 1 mg  1 mg Oral BID [name], MD   1 mg at 03/16/18 0829  . collagenase (SANTYL) ointment   Topical Daily Mikhail, Nita Sells, DO      . dextromethorphan-guaiFENesin [facility] DM) 30-600 MG per 12 hr tablet 1 tablet  1 tablet Oral BID PRN [name],  MD      . divalproex (DEPAKOTE SPRINKLE) capsule 500 mg  500 mg Oral TID Marcine Matarahlstedt, Stephen, MD   500 mg at 03/16/18 0824  . feeding supplement (PRO-STAT SUGAR FREE 64) liquid 30 mL  30 mL Oral Daily Marcine Matarahlstedt, Stephen, MD   30 mL at 03/16/18 0831  . insulin aspart (novoLOG) injection 0-9 Units  0-9 Units Subcutaneous TID WC Marcine Matarahlstedt, Stephen, MD      . insulin detemir (LEVEMIR) injection 10 Units  10 Units Subcutaneous Daily Marcine Matarahlstedt, Stephen, MD   10 Units at 03/16/18 0830  . MEDLINE mouth rinse  15 mL Mouth Rinse BID Lorretta HarpNiu, Xilin, MD   15 mL at 03/15/18 2255  .  metoprolol tartrate (LOPRESSOR) injection 2.5 mg  2.5 mg Intravenous Q6H Mikhail, MulberryMaryann, DO   2.5 mg at 03/16/18 [phone]  . metoprolol tartrate (LOPRESSOR) tablet 50 mg  50 mg Oral QHS Mikhail, MarsingMaryann, DO   50 mg at 03/15/18 2241  . metoprolol tartrate (LOPRESSOR) tablet 75 mg  75 mg Oral Daily Edsel PetrinMikhail, Maryann, DO   75 mg at 03/16/18 [phone]  . multivitamin with minerals tablet 1 tablet  1 tablet Oral Daily Marcine Matarahlstedt, Stephen, MD   1 tablet at 03/16/18 [phone]  . phenylephrine-shark liver oil-mineral oil-petrolatum (PREPARATION H) rectal ointment 1 application  1 application Rectal TID PRN Marcine Matarahlstedt, Stephen, MD      . polyethylene glycol (MIRALAX / GLYCOLAX) packet 17 g  17 g Oral Daily Marcine Matarahlstedt, Stephen, MD   17 g at 03/15/18 1010  . QUEtiapine (SEROQUEL) tablet 125 mg  125 mg Oral QHS Lorretta HarpNiu, Xilin, MD   125 mg at 03/15/18 2240  . QUEtiapine (SEROQUEL) tablet 150 mg  150 mg Oral Daily Marcine Matarahlstedt, Stephen, MD   150 mg at 03/16/18 0827  . selenium sulfide (SELSUN) 1 % shampoo 1 application  1 application Topical Q T,Th,Sat-1800 Marcine Matarahlstedt, Stephen, MD   1 application at 03/13/18 2026  . sertraline (ZOLOFT) tablet 100 mg  100 mg Oral Daily Marcine Matarahlstedt, Stephen, MD   100 mg at 03/16/18 0830     Discharge Medications: Please see discharge summary for a list of discharge medications.  Relevant Imaging Results:  Relevant Lab Results:   Additional Information SSN: [id number]  Abigail ButtsSusan Jahmire Ruffins, LCSW

## 2018-03-16 NOTE — Progress Notes (Signed)
Paged [facility]. Fever 102.9. Tylenol cannot be given for another hour [facility]
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
## 2018-03-16 NOTE — Progress Notes (Addendum)
CRITICAL VALUE ALERT  Critical Value: Osmolality 353  Date & Time Notied:  07/30; 0945  Provider Notified: Mikhail  Orders Received/Actions taken: I will continue to monitor the patient closely.   Sheppard Evensina Quavion Boule RN

## 2018-03-16 NOTE — Consult Note (Addendum)
[facility] Renal Consultation Note  Requesting MD: Ree Kida, MD Indication for Consultation: Hypernatremia  HPI: Cody [name] is a 52 y.o. male with a PMHX notable for traumatic brain injury s/p ventriculoperitoneal shunt, spasmatic left hemiplegia, HTN, DMII, hypothyroidism, MDD, GAD, who presented to the ED with fever, abdominal pain, hypotension and worsening mental status from baseline from his SNF. Patient was initially admitted for sepsis 2/2 pyelonephritis w/ ureterovesical junction obstruction which was treated by urology with stenting on day 2 of admission, as well as continuous normal saline, and antibiotics. There was marked improvement in his overall status with his Scr decreasing to 1.66 from 2.07 as well as resolution of his fever and hypotension. By day three the patient had developed marked hypernatremia that had not responded to discontinuation of normal saline and administration of free water prompting consultation to nephrology.   Creatinine, Ser  Date/Time Value Ref Range Status  03/16/2018 12:19 PM 1.66 (H) 0.61 - 1.24 mg/dL Final  03/16/2018 11:00 AM 1.63 (H) 0.61 - 1.24 mg/dL Final  03/16/2018 05:11 AM 1.63 (H) 0.61 - 1.24 mg/dL Final  03/15/2018 05:04 AM 1.47 (H) 0.61 - 1.24 mg/dL Final  03/14/2018 07:23 AM 1.73 (H) 0.61 - 1.24 mg/dL Final  03/13/2018 04:17 AM 1.78 (H) 0.61 - 1.24 mg/dL Final  03/12/2018 07:59 AM 2.22 (H) 0.61 - 1.24 mg/dL Final  03/11/2018 08:28 AM 1.58 (H) 0.61 - 1.24 mg/dL Final  03/10/2018 11:41 PM 2.10 (H) 0.61 - 1.24 mg/dL Final  03/10/2018 07:46 PM 2.07 (H) 0.61 - 1.24 mg/dL Final  11/20/2009 01:27 PM 0.83 0.4 - 1.5 mg/dL Final  10/12/2008 08:15 AM 0.54 0.4 - 1.5 mg/dL Final  10/11/2008 04:40 AM 0.63 0.4 - 1.5 mg/dL Final  10/10/2008 12:30 PM 0.52 DELTA CHECK NOTED 0.4 - 1.5 mg/dL Final  10/09/2008 05:35 PM 0.8 0.4 - 1.5 mg/dL Final  09/24/2007 05:25 AM 0.91  Final  09/23/2007 05:30 AM 0.87  Final  09/22/2007 05:55 AM 0.79   Final  09/20/2007 03:45 PM 0.99  Final  09/20/2007 03:42 PM 1.02  Final  09/19/2007 07:32 PM 1.1  Final     PMHx:   Past Medical History:  Diagnosis Date  . Depression   . Diabetes mellitus without complication ([place])   . GAD (generalized anxiety disorder)   . Hypertension   . Hypothyroidism   . Spastic hemiplegia ([place])   . Thyroid disease    hypothyroidism    Past Surgical History:  Procedure Laterality Date  . CYSTOSCOPY W/ URETERAL STENT PLACEMENT Right 03/11/2018   Procedure: CYSTOSCOPY WITH RETROGRADE PYELOGRAM/DOUBLE J STENT PLACEMENT;  Surgeon: Franchot Gallo, MD;  Location: [facility];  Service: Urology;  Laterality: Right;  . VENTRICULOPERITONEAL SHUNT      Family Hx:  Family History  Problem Relation Age of Onset  . Hypertension Mother   . Stroke Mother   . Hypertension Father   . Hyperlipidemia Father   . COPD Father   . Heart disease Father   . Diabetes Mellitus II Father     Social History:  reports that he has never smoked. He has never used smokeless tobacco. He reports that he does not drink alcohol or use drugs.  Allergies: Not on File  Medications: Prior to Admission medications   Medication Sig Start Date End Date Taking? Authorizing Provider  acetaminophen (TYLENOL) 325 MG tablet Take 650 mg by mouth 2 (two) times daily. At 1300 and 2100   Yes [provider]  Amino Acids-Protein Hydrolys (FEEDING SUPPLEMENT,  PRO-STAT SUGAR FREE 64,) LIQD Take 30 mLs by mouth daily.   Yes [provider]  amLODipine (NORVASC) 10 MG tablet Take 10 mg by mouth daily.   Yes [provider]  amoxicillin-clavulanate (AUGMENTIN) 875-125 MG tablet Take 1 tablet by mouth 2 (two) times daily.   Yes [provider]  baclofen (LIORESAL) 10 MG tablet Take 10 mg by mouth 3 (three) times daily.   Yes [provider]  clonazePAM (KLONOPIN) 1 MG tablet Take 0.5-1 mg by mouth See admin instructions. Take 1 tablet at 8 am and 8 pm then  Take 1/2 tablet at 2 pm   Yes [provider]  divalproex (DEPAKOTE SPRINKLE) 125 MG capsule Take 500 mg by mouth 3 (three) times daily.   Yes [provider]  insulin detemir (LEVEMIR) 100 UNIT/ML injection Inject 20 Units into the skin daily.   Yes [provider]  ipratropium-albuterol (DUONEB) 0.5-2.5 (3) MG/3ML SOLN Take 3 mLs by nebulization 4 (four) times daily.   Yes [provider]  metoprolol tartrate (LOPRESSOR) 25 MG tablet Take 75 mg by mouth daily.   Yes [provider]  metoprolol tartrate (LOPRESSOR) 50 MG tablet Take 50 mg by mouth at bedtime.   Yes [provider]  Multiple Vitamin (MULTIVITAMIN WITH MINERALS) TABS tablet Take 1 tablet by mouth daily.   Yes [provider]  phenylephrine-shark liver oil-mineral oil-petrolatum (PREPARATION H) 0.25-3-14-71.9 % rectal ointment Place 1 application rectally 3 (three) times daily as needed for hemorrhoids.   Yes [provider]  polyethylene glycol (MIRALAX / GLYCOLAX) packet Take 17 g by mouth daily.   Yes [provider]  QUEtiapine (SEROQUEL) 100 MG tablet Take 100-150 mg by mouth See admin instructions. Take 1 and 1/2 tablets every morning then take 1 tablet with 25 mg at bedtime to equal 125 mg   Yes [provider]  QUEtiapine (SEROQUEL) 25 MG tablet Take 25 mg by mouth at bedtime. Take with 100 mg to equal 125 mg   Yes [provider]  selenium sulfide (SELSUN) 1 % LOTN Apply 1 application topically every Tuesday, Thursday, and Saturday at 6 PM.   Yes [provider]  sertraline (ZOLOFT) 100 MG tablet Take 100 mg by mouth daily.   Yes [provider]  UNABLE TO FIND Take 120 mLs by mouth 3 (three) times daily. Med Name: MedPass 2.0   Yes [provider]    I have reviewed the patient's current medications.  Labs:  Results for orders placed or performed during the hospital encounter of 03/10/18 (from the  past 48 hour(s))  Glucose, capillary     Status: Abnormal   Collection Time: 03/14/18  4:20 PM  Result Value Ref Range   Glucose-Capillary 103 (H) 70 - 99 mg/dL  Glucose, capillary     Status: Abnormal   Collection Time: 03/14/18  8:55 PM  Result Value Ref Range   Glucose-Capillary 112 (H) 70 - 99 mg/dL  CBC     Status: Abnormal   Collection Time: 03/15/18  5:04 AM  Result Value Ref Range   WBC 13.2 (H) 4.0 - 10.5 K/uL   RBC 3.50 (L) 4.22 - 5.81 MIL/uL   Hemoglobin 9.0 (L) 13.0 - 17.0 g/dL   HCT 30.1 (L) 39.0 - 52.0 %   MCV 86.0 78.0 - 100.0 fL   MCH 25.7 (L) 26.0 - 34.0 pg   MCHC 29.9 (L) 30.0 - 36.0 g/dL   RDW 18.1 (H) 11.5 -  15.5 %   Platelets 165 150 - 400 K/uL    Comment: Performed at [facility], [place] [address] [place], [place] [address]  Comprehensive metabolic panel     Status: Abnormal   Collection Time: 03/15/18  5:04 AM  Result Value Ref Range   Sodium 157 (H) 135 - 145 mmol/L   Potassium 3.8 3.5 - 5.1 mmol/L    Comment: DELTA CHECK NOTED HEMOLYSIS AT THIS LEVEL MAY AFFECT RESULT    Chloride 125 (H) 98 - 111 mmol/L   CO2 25 22 - 32 mmol/L   Glucose, Bld 113 (H) 70 - 99 mg/dL   BUN 28 (H) 6 - 20 mg/dL   Creatinine, Ser 1.47 (H) 0.61 - 1.24 mg/dL   Calcium 7.6 (L) 8.9 - 10.3 mg/dL   Total Protein 7.3 6.5 - 8.1 g/dL   Albumin 1.6 (L) 3.5 - 5.0 g/dL   AST 39 15 - 41 U/L   ALT 24 0 - 44 U/L   Alkaline Phosphatase 102 38 - 126 U/L   Total Bilirubin 0.9 0.3 - 1.2 mg/dL   GFR calc non Af Amer 54 (L) >60 mL/min   GFR calc Af Amer >60 >60 mL/min    Comment: (NOTE) The eGFR has been calculated using the CKD EPI equation. This calculation has not been validated in all clinical situations. eGFR's persistently <60 mL/min signify possible Chronic Kidney Disease.    Anion gap 7 5 - 15    Comment: Performed at [facility] [address], [place], [place] [address]  Glucose, capillary     Status: Abnormal   Collection Time: 03/15/18  7:42 AM  Result  Value Ref Range   Glucose-Capillary 111 (H) 70 - 99 mg/dL  Glucose, capillary     Status: Abnormal   Collection Time: 03/15/18 12:18 PM  Result Value Ref Range   Glucose-Capillary 102 (H) 70 - 99 mg/dL  Glucose, capillary     Status: None   Collection Time: 03/15/18  5:34 PM  Result Value Ref Range   Glucose-Capillary 79 70 - 99 mg/dL  Glucose, capillary     Status: None   Collection Time: 03/15/18 10:21 PM  Result Value Ref Range   Glucose-Capillary 90 70 - 99 mg/dL  CBC     Status: Abnormal   Collection Time: 03/16/18  5:11 AM  Result Value Ref Range   WBC 13.7 (H) 4.0 - 10.5 K/uL   RBC 3.75 (L) 4.22 - 5.81 MIL/uL   Hemoglobin 9.7 (L) 13.0 - 17.0 g/dL   HCT 32.0 (L) 39.0 - 52.0 %   MCV 85.3 78.0 - 100.0 fL   MCH 25.9 (L) 26.0 - 34.0 pg   MCHC 30.3 30.0 - 36.0 g/dL   RDW 18.4 (H) 11.5 - 15.5 %   Platelets 203 150 - 400 K/uL    Comment: Performed at [facility], [place] [address] [place], [place] [address]  Basic metabolic panel     Status: Abnormal   Collection Time: 03/16/18  5:11 AM  Result Value Ref Range   Sodium 160 (H) 135 - 145 mmol/L   Potassium 3.0 (L) 3.5 - 5.1 mmol/L    Comment: DELTA CHECK NOTED   Chloride 128 (H) 98 - 111 mmol/L   CO2 26 22 - 32 mmol/L   Glucose, Bld 89 70 - 99 mg/dL   BUN 32 (H) 6 - 20 mg/dL   Creatinine, Ser 1.63 (H) 0.61 - 1.24 mg/dL  Calcium 7.7 (L) 8.9 - 10.3 mg/dL   GFR calc non Af Amer 47 (L) >60 mL/min   GFR calc Af Amer 55 (L) >60 mL/min    Comment: (NOTE) The eGFR has been calculated using the CKD EPI equation. This calculation has not been validated in all clinical situations. eGFR's persistently <60 mL/min signify possible Chronic Kidney Disease.    Anion gap 6 5 - 15    Comment: Performed at [facility] [address], [place], [place] [address]  Osmolality     Status: Abnormal   Collection Time: 03/16/18  7:02 AM  Result Value Ref Range   Osmolality 353 (HH) 275 - 295 mOsm/kg    Comment: REPEATED TO  VERIFY CRITICAL RESULT CALLED TO, READ BACK BY AND VERIFIED WITH: TINA TURVILLE,RN AT 6295 03/15/18 BY ZBEECH. Performed at [facility], [place] [address] [place], [place] [address]   Glucose, capillary     Status: None   Collection Time: 03/16/18  7:42 AM  Result Value Ref Range   Glucose-Capillary 83 70 - 99 mg/dL  Basic metabolic panel     Status: Abnormal   Collection Time: 03/16/18 11:00 AM  Result Value Ref Range   Sodium 165 (HH) 135 - 145 mmol/L    Comment: CRITICAL RESULT CALLED TO, READ BACK BY AND VERIFIED WITH: T.TOURVILLE,RN 1141 03/16/18 CLARK,S    Potassium 3.0 (L) 3.5 - 5.1 mmol/L   Chloride 127 (H) 98 - 111 mmol/L   CO2 28 22 - 32 mmol/L   Glucose, Bld 92 70 - 99 mg/dL   BUN 32 (H) 6 - 20 mg/dL   Creatinine, Ser 1.63 (H) 0.61 - 1.24 mg/dL   Calcium 7.7 (L) 8.9 - 10.3 mg/dL   GFR calc non Af Amer 47 (L) >60 mL/min   GFR calc Af Amer 55 (L) >60 mL/min    Comment: (NOTE) The eGFR has been calculated using the CKD EPI equation. This calculation has not been validated in all clinical situations. eGFR's persistently <60 mL/min signify possible Chronic Kidney Disease.    Anion gap 10 5 - 15    Comment: Performed at [facility] [address], [place], [place] [address]  Glucose, capillary     Status: None   Collection Time: 03/16/18 11:22 AM  Result Value Ref Range   Glucose-Capillary 81 70 - 99 mg/dL  Basic metabolic panel     Status: Abnormal   Collection Time: 03/16/18 12:19 PM  Result Value Ref Range   Sodium 165 (HH) 135 - 145 mmol/L    Comment: CRITICAL RESULT CALLED TO, READ BACK BY AND VERIFIED WITH: T TOURVILLE RN 0272 03/16/2018 BY A BENNETT    Potassium 3.1 (L) 3.5 - 5.1 mmol/L   Chloride 130 (H) 98 - 111 mmol/L   CO2 26 22 - 32 mmol/L   Glucose, Bld 95 70 - 99 mg/dL   BUN 32 (H) 6 - 20 mg/dL   Creatinine, Ser 1.66 (H) 0.61 - 1.24 mg/dL   Calcium 7.6 (L) 8.9 - 10.3 mg/dL   GFR calc non Af Amer 46 (L) >60 mL/min   GFR calc Af Amer 54  (L) >60 mL/min    Comment: (NOTE) The eGFR has been calculated using the CKD EPI equation. This calculation has not been validated in all clinical situations. eGFR's persistently <60 mL/min signify possible Chronic Kidney Disease.    Anion gap 9 5 - 15    Comment: Performed at [facility], [place] [address] [place], [place] [address]     ROS:  Review of systems not obtained due to patient factors.  Physical Exam: Vitals:   03/16/18 1146 03/16/18 1312  BP: 133/80   Pulse:    Resp:    Temp:  100 F (37.8 C)  SpO2:  92%     Physical Exam  Constitutional: He appears well-nourished. He appears listless. No distress.  HENT:  Head: Atraumatic.  Eyes: Conjunctivae are normal.  Cardiovascular: Normal rate and regular rhythm.  No murmur heard. Pulmonary/Chest: Effort normal and breath sounds normal. No respiratory distress. He has no wheezes.  Abdominal: Soft. Bowel sounds are normal. He exhibits no distension. There is no tenderness.  Musculoskeletal: He exhibits no edema or tenderness.  Neurological: He appears listless.  Skin: Skin is warm. He is not diaphoretic.   Assessment/Plan: 1. Renal: Hypernatremia/AKI- The patients initial Scr was 2.07, and sodium of 141. By day two his sodium had increased to 147 following the administration of normal saline. By day three the patients sodium increased markedly to 155 that was associated with persistent polyuria absent iatrogenic diuresis. He was placed on D5W on 7/27 following the return of the Na of 155 at a rate of 40m/hr. The patients sodium remained stable at 154-157 over the following two days but again increased to 160 by the morning of 7/30 (this is thought to be the result of his post obstructive solute diuresis and the liter of normal saline mistakenly given overnight). By 1100 hours on 07/30 his sodium had again increased to 165. I feel that given the course of events his initial mild hypernatremia was most likely the result  of the normal saline rehydration combined with decreased free water replacement due to his inability to hydrate himself orally. Following the stent placement he began to self diurese due to the build up of solutes at a rate that could not be compensated for with the D5W again given his inability to self hydrate. It did not help matters that he was mistakenly treated with normal saline x 1 liter. Predicting an end goal of 1433ml/L at a rate of 6-1263m/24hr rate of recovery the patient will require continued free water replacement to correct the 5.5L deficit currently calculated. I agree that a goal of 4-5mm3mdecrease to a sodium of 160mm70my 0500 hours is ideal and would attempt this with titrating the patients D5W based on his Q4 hour BMP sodium levels. A goal of correction of ~1-2mEq 30mhours should be targeted. We will need compensate for his free water losses through the patients urine which may require higher doses of D5W depending on the rate of recovery.  --Q4 hour BMPS --Titrate D5W by Na to gain 4-5mmol 79m0500 hours on 07/31. I would adjust by 50ml in31ments initially depending on rate of recovery --Ordered urine osmolality, sodium and potassium to calculate urinary free water but given the rate of rehydration these may not be reliable.  3. Acute encephalopathy on baseline cognitive impairment - Uncertain baseline. Patient unable to feed himself or respond to verbal stimuli. As this preceded the hypernatremia it appears unrelated. As per primary team.  2. Hypertension/volume  - BP WNL's at ~125's/80. Would continue to monitor closely. Volume status is concerning as he is listed as being negative 1.5L and his weight is down 6kg since admission. He has not maintained a positive fluid balance with polyuria exceeding his rate of hydration. As above, replace and monitor closely.  4.  Anemia, Normocytic - Hgb Stable at 9.7. Would consider Iron studies but feel that this may be due to some underlying  mild chronic disease such as CKD. Will evaluate further.   Kathi Ludwig, MD Internal Medicine PGY-2  I personally saw and evaluated the patient.  Interesting case of hypernatremia.  On admission his serum sodium and UOP were normal but have now trended towards marked hypernatremia and polyuria.  Certainly the presence of a VP shunt and the polyuria raise the question of a central DI, however given the clinical setting and evaluation to date this is most likely a solute diuresis following obstruction. He could have a component of nephrogenic DI (post obstructive injury +/- fairly marked hypokalemia).  His urine osms are > 1000/d which is consistent with solute diuresis.  We will continue to supply free water via IV (NGT would be acceptable route, but in the short term we're already titrating the IV so we can stick with that) to correct his free water deficit which started at ~ 5.5L.  Checking q4h BMPs for Na/K today.  I suspect this will be a self limited process and as his AKI resolves the hypernatremia will resolve as well.  If not, we will re-explore need for evaluation for DI.  Jannifer Hick MD

## 2018-03-17 ENCOUNTER — Inpatient Hospital Stay (HOSPITAL_COMMUNITY): Payer: Medicare Other

## 2018-03-17 DIAGNOSIS — R0902 Hypoxemia: Secondary | ICD-10-CM

## 2018-03-17 LAB — BASIC METABOLIC PANEL
ANION GAP: 10 (ref 5–15)
Anion gap: 10 (ref 5–15)
Anion gap: 10 (ref 5–15)
Anion gap: 10 (ref 5–15)
BUN: 31 mg/dL — ABNORMAL HIGH (ref 6–20)
BUN: 27 mg/dL — ABNORMAL HIGH (ref 6–20)
BUN: 25 mg/dL — ABNORMAL HIGH (ref 6–20)
BUN: 25 mg/dL — ABNORMAL HIGH (ref 6–20)
CALCIUM: 7 mg/dL — AB (ref 8.9–10.3)
CALCIUM: 7 mg/dL — AB (ref 8.9–10.3)
CALCIUM: 7.1 mg/dL — AB (ref 8.9–10.3)
CHLORIDE: 123 mmol/L — AB (ref 98–111)
CHLORIDE: 122 mmol/L — AB (ref 98–111)
CHLORIDE: 121 mmol/L — AB (ref 98–111)
CO2: 25 mmol/L (ref 22–32)
CO2: 25 mmol/L (ref 22–32)
CO2: 26 mmol/L (ref 22–32)
CO2: 24 mmol/L (ref 22–32)
Calcium: 7 mg/dL — ABNORMAL LOW (ref 8.9–10.3)
Chloride: 119 mmol/L — ABNORMAL HIGH (ref 98–111)
Creatinine, Ser: 1.57 mg/dL — ABNORMAL HIGH (ref 0.61–1.24)
Creatinine, Ser: 1.4 mg/dL — ABNORMAL HIGH (ref 0.61–1.24)
Creatinine, Ser: 1.27 mg/dL — ABNORMAL HIGH (ref 0.61–1.24)
Creatinine, Ser: 1.25 mg/dL — ABNORMAL HIGH (ref 0.61–1.24)
GFR calc Af Amer: 57 mL/min — ABNORMAL LOW (ref >60)
GFR calc non Af Amer: 49 mL/min — ABNORMAL LOW (ref >60)
GFR calc non Af Amer: 57 mL/min — ABNORMAL LOW (ref >60)
GFR calc non Af Amer: >60 mL/min (ref >60)
GFR calc non Af Amer: >60 mL/min (ref >60)
GLUCOSE: 162 mg/dL — AB (ref 70–99)
GLUCOSE: 163 mg/dL — AB (ref 70–99)
Glucose, Bld: 116 mg/dL — ABNORMAL HIGH (ref 70–99)
Glucose, Bld: 147 mg/dL — ABNORMAL HIGH (ref 70–99)
POTASSIUM: 3.7 mmol/L (ref 3.5–5.1)
POTASSIUM: 3.3 mmol/L — AB (ref 3.5–5.1)
Potassium: 2.8 mmol/L — ABNORMAL LOW (ref 3.5–5.1)
Potassium: 3.3 mmol/L — ABNORMAL LOW (ref 3.5–5.1)
Sodium: 158 mmol/L — ABNORMAL HIGH (ref 135–145)
Sodium: 157 mmol/L — ABNORMAL HIGH (ref 135–145)
Sodium: 157 mmol/L — ABNORMAL HIGH (ref 135–145)
Sodium: 153 mmol/L — ABNORMAL HIGH (ref 135–145)

## 2018-03-17 LAB — CBC
HEMATOCRIT: 31.5 % — AB (ref 39.0–52.0)
Hemoglobin: 9.6 g/dL — ABNORMAL LOW (ref 13.0–17.0)
MCH: 26.4 pg (ref 26.0–34.0)
MCHC: 30.5 g/dL (ref 30.0–36.0)
MCV: 86.8 fL (ref 78.0–100.0)
Platelets: 192 10*3/uL (ref 150–400)
RBC: 3.63 MIL/uL — ABNORMAL LOW (ref 4.22–5.81)
RDW: 18.4 % — AB (ref 11.5–15.5)
WBC: 15.9 10*3/uL — AB (ref 4.0–10.5)

## 2018-03-17 LAB — GLUCOSE, CAPILLARY
GLUCOSE-CAPILLARY: 121 mg/dL — AB (ref 70–99)
Glucose-Capillary: 116 mg/dL — ABNORMAL HIGH (ref 70–99)
Glucose-Capillary: 128 mg/dL — ABNORMAL HIGH (ref 70–99)
Glucose-Capillary: 144 mg/dL — ABNORMAL HIGH (ref 70–99)
Glucose-Capillary: 146 mg/dL — ABNORMAL HIGH (ref 70–99)

## 2018-03-17 MED ORDER — SODIUM CHLORIDE 0.9 % IV SOLN
3.0000 g | Freq: Four times a day (QID) | INTRAVENOUS | Status: DC
  Administered 2018-03-17 – 2018-03-18 (×4): 3 g via INTRAVENOUS
  Filled 2018-03-17 (×7): qty 3

## 2018-03-17 MED ORDER — SODIUM CHLORIDE 0.9 % IV SOLN
INTRAVENOUS | Status: DC | PRN
  Administered 2018-03-17 – 2018-04-01 (×5): via INTRAVENOUS

## 2018-03-17 MED ORDER — POTASSIUM CHLORIDE 20 MEQ PO PACK
40.0000 meq | PACK | Freq: Once | ORAL | Status: AC
  Administered 2018-03-17: 40 meq via ORAL
  Filled 2018-03-17: qty 2

## 2018-03-17 MED ORDER — ACETAMINOPHEN 325 MG PO TABS
325.0000 mg | ORAL_TABLET | Freq: Once | ORAL | Status: AC
  Administered 2018-03-17: 325 mg via ORAL

## 2018-03-17 MED ORDER — POTASSIUM CL IN DEXTROSE 5% 20 MEQ/L IV SOLN
20.0000 meq | INTRAVENOUS | Status: AC
  Administered 2018-03-17 – 2018-03-18 (×4): 20 meq via INTRAVENOUS
  Filled 2018-03-17 (×4): qty 1000

## 2018-03-17 NOTE — Care Management Note (Addendum)
Case Management Note  Patient Details  Name: Cody RanBobby Stonehouse MRN: [id number] Date of Birth: 03/23/1966  Subjective/Objective: Pt presented for fever, worsening altered mental status and abdominal pain. On presentation patient had sat's in the 70's. Treating for Severe Sepsis 2/2 acute pyelonephritis. Pt initially on IV Vancomycin and Zosyn. Hx of traumatic brain injury from Long term SNF at [facility]. CSW assisting with disposition needs to transition back to facility.                 Action/Plan: ID continues to monitor 2/2  Increased fevers and elevated WBC's. CM will continue to monitor for additional needs.   Expected Discharge Date:                  Expected Discharge Plan:  Skilled Nursing Facility(long term care at [facility])  In-House Referral:  Clinical Social Work  Discharge planning Services  CM Consult  Post Acute Care Choice:  NA Choice offered to:  NA  DME Arranged:  N/A DME Agency:  NA  HH Arranged:  NA HH Agency:  NA  Status of Service:  Completed, signed off  If discussed at Long Length of Stay Meetings, dates discussed:    Additional Comments: 1446 04-02-18 Tomi BambergerBrenda Graves-Bigelow, RN,BSN [phone] Pt continues on IV Rocephin s/p right percutaneous Nephrostomy Tube Drain. Plan will be for SNF long-term placement. CSW following for disposition needs. CM will continue to monitor.  Gala LewandowskyGraves-Bigelow, Mildred Bollard Kaye, RN 03/17/2018, 12:21 PM

## 2018-03-17 NOTE — Progress Notes (Signed)
[facility]   Reason for visit: Follow up on fever  Interval History: febrile again to 102; no acute events; swallow eval noted.  WBC up a bit to 15.9.  Patient non verbal    Physical Exam: Constitutional:  Vitals:   03/17/18 0800 03/17/18 1100  BP: 137/85 122/84  Pulse: 94 (!) 101  Resp: 18 (!) 28  Temp: 99.7 F (37.6 C)   SpO2: 94% 95%   patient appears in NAD Eyes: anicteric HENT: no thrush Respiratory: Normal respiratory effort; CTA B Cardiovascular: RRR GI: soft, nt, nd  Review of Systems: Unable to be assessed due to mental status  Lab Results  Component Value Date   WBC 15.9 (H) 03/17/2018   HGB 9.6 (L) 03/17/2018   HCT 31.5 (L) 03/17/2018   MCV 86.8 03/17/2018   PLT 192 03/17/2018    Lab Results  Component Value Date   CREATININE 1.40 (H) 03/17/2018   BUN 27 (H) 03/17/2018   NA 157 (H) 03/17/2018   K 3.7 03/17/2018   CL 122 (H) 03/17/2018   CO2 25 03/17/2018    Lab Results  Component Value Date   ALT 24 03/15/2018   AST 39 03/15/2018   ALKPHOS 102 03/15/2018     Microbiology: Recent Results (from the past 240 hour(s))  Culture, blood (Routine x 2)     Status: None   Collection Time: 03/10/18  7:46 PM  Result Value Ref Range Status   Specimen Description BLOOD RIGHT ANTECUBITAL  Final   Special Requests   Final    BOTTLES DRAWN AEROBIC AND ANAEROBIC Blood Culture adequate volume   Culture   Final    NO GROWTH 5 DAYS Performed at [facility], [address] [place], [place] [address]    Report Status 03/15/2018 FINAL  Final  Culture, blood (Routine x 2)     Status: None   Collection Time: 03/10/18  8:35 PM  Result Value Ref Range Status   Specimen Description BLOOD RIGHT HAND  Final   Special Requests   Final    BOTTLES DRAWN AEROBIC ONLY Blood Culture results may not be optimal due to an inadequate volume of blood received in culture bottles   Culture   Final    NO GROWTH 5 DAYS Performed at [facility] [facility], [address], [place], [place] [address]    Report Status 03/15/2018 FINAL  Final  Urine culture     Status: None   Collection Time: 03/10/18  9:10 PM  Result Value Ref Range Status   Specimen Description URINE, RANDOM  Final   Special Requests NONE  Final   Culture   Final    NO GROWTH Performed at [facility], [address], [place], [place] [address]    Report Status 03/12/2018 FINAL  Final  MRSA PCR Screening     Status: None   Collection Time: 03/11/18  5:15 AM  Result Value Ref Range Status   MRSA by PCR NEGATIVE NEGATIVE Final    Comment:        The GeneXpert MRSA Assay (FDA approved for NASAL specimens only), is one component of a comprehensive MRSA colonization surveillance program. It is not intended to diagnose MRSA infection nor to guide or monitor treatment for MRSA infections. Performed at [facility], [address], [place], [place] [address]   Culture, blood (routine x 2)     Status: None (Preliminary result)  Collection Time: 03/14/18 11:27 AM  Result Value Ref Range Status   Specimen Description BLOOD RIGHT HAND  Final   Special Requests   Final    BOTTLES DRAWN AEROBIC ONLY Blood Culture results may not be optimal due to an inadequate volume of blood received in culture bottles   Culture   Final    NO GROWTH 2 DAYS Performed at [facility], [address] [place], [place] [address]    Report Status PENDING  Incomplete  Culture, blood (routine x 2)     Status: None (Preliminary result)   Collection Time: 03/14/18 11:31 AM  Result Value Ref Range Status   Specimen Description BLOOD RIGHT WRIST  Final   Special Requests   Final    BOTTLES DRAWN AEROBIC ONLY Blood Culture adequate volume   Culture   Final    NO GROWTH 2 DAYS Performed at [facility], [address] [place], [place] [address]    Report Status PENDING  Incomplete    Impression/Plan:  1. Fever - worsened again today.  not sure of source  though pneumonia possible based on CT findings and some mild hypoxia.  Aspiration may be part of it with the swallow evaluation.   I am going to start amp/sulbactam at this time and monitor.  2.  Hypoxia - may be from infection.  Will treat as above.

## 2018-03-17 NOTE — Progress Notes (Signed)
Paged [facility]. Patient continues to have temp (38.2 C) PRN tylenol not due yet

## 2018-03-17 NOTE — Progress Notes (Addendum)
[facility] ROUNDING NOTE   Subjective:   Interval History: has no complaints today but he has improved symptomatically with regard to his mental status as he is now able to make eye contact and move his extremities minimally.  Objective:  Vital signs in last 24 hours:  Temp:  [98.7 F (37.1 C)-102.9 F (39.4 C)] 100 F (37.8 C) (07/31 0551) Pulse Rate:  [95-116] 95 (07/31 0546) Resp:  [17-28] 21 (07/31 0546) BP: (114-138)/(75-84) 114/79 (07/31 0546) SpO2:  [87 %-95 %] 87 % (07/31 0546) Weight:  [145 lb (65.8 kg)] 145 lb (65.8 kg) (07/31 0402)  Weight change: -1 lb (-0.454 kg) Filed Weights   03/15/18 0531 03/16/18 0427 03/17/18 0402  Weight: 154 lb 15.7 oz (70.3 kg) 146 lb (66.2 kg) 145 lb (65.8 kg)   Intake/Output: I/O last 3 completed shifts: In: 4209.4 [P.O.:700; I.V.:3509.4] Out: 9800 [Urine:9800]   Intake/Output this shift:  No intake/output data recorded.  Physical Exam: General-Alert, in no acute distress, afebrile, non-diaphoretic  CVS- RRR, no mrg's RS- CTA ABD- BS present soft non-distended EXT- no edema   Basic Metabolic Panel: Recent Labs  Lab 03/14/18 0723  03/16/18 1100 03/16/18 1219 03/16/18 1521 03/16/18 1945 03/17/18 0521  NA 154*   < > 165* 165* 164* 160* 158*  K 3.1*   < > 3.0* 3.1* 3.1* 3.3* 2.8*  CL 124*   < > 127* 130* 124* 127* 123*  CO2 22   < > 28 26 26 26 25   GLUCOSE 104*   < > 92 95 136* 148* 162*  BUN 33*   < > 32* 32* 36* 36* 31*  CREATININE 1.73*   < > 1.63* 1.66* 1.73* 1.78* 1.57*  CALCIUM 7.5*   < > 7.7* 7.6* 7.4* 7.1* 7.0*  MG 2.4  --   --   --   --   --   --    < > = values in this interval not displayed.    Liver Function Tests: Recent Labs  Lab 03/10/18 1946 03/15/18 0504  AST 48* 39  ALT 32 24  ALKPHOS 115 102  BILITOT 1.8* 0.9  PROT 7.8 7.3  ALBUMIN 2.3* 1.6*   No results for input(s): LIPASE, AMYLASE in the last 168 hours. No results for input(s): AMMONIA in the last 168  hours.  CBC: Recent Labs  Lab 03/10/18 2332  03/13/18 0417 03/14/18 0723 03/15/18 0504 03/16/18 0511 03/17/18 0521  WBC 15.5*   < > 15.5* 12.9* 13.2* 13.7* 15.9*  NEUTROABS 11.0*  --   --   --   --   --   --   HGB 10.0*   < > 10.3* 8.9* 9.0* 9.7* 9.6*  HCT 32.6*   < > 33.3* 29.3* 30.1* 32.0* 31.5*  MCV 86.7   < > 85.8 85.9 86.0 85.3 86.8  PLT 148*   < > 143* 162 165 203 192   < > = values in this interval not displayed.    Cardiac Enzymes: No results for input(s): CKTOTAL, CKMB, CKMBINDEX, TROPONINI in the last 168 hours.  BNP: Invalid input(s): POCBNP  CBG: Recent Labs  Lab 03/15/18 1734 03/15/18 2221 03/16/18 0742 03/16/18 1122 03/16/18 2059  GLUCAP 79 90 83 81 125*    Microbiology: Results for orders placed or performed during the hospital encounter of 03/10/18  Culture, blood (Routine x 2)     Status: None   Collection Time: 03/10/18  7:46 PM  Result Value Ref Range Status  Specimen Description BLOOD RIGHT ANTECUBITAL  Final   Special Requests   Final    BOTTLES DRAWN AEROBIC AND ANAEROBIC Blood Culture adequate volume   Culture   Final    NO GROWTH 5 DAYS Performed at [facility], [address], [place], [place] [address]    Report Status 03/15/2018 FINAL  Final  Culture, blood (Routine x 2)     Status: None   Collection Time: 03/10/18  8:35 PM  Result Value Ref Range Status   Specimen Description BLOOD RIGHT HAND  Final   Special Requests   Final    BOTTLES DRAWN AEROBIC ONLY Blood Culture results may not be optimal due to an inadequate volume of blood received in culture bottles   Culture   Final    NO GROWTH 5 DAYS Performed at [facility], [address], [place], [place] [address]    Report Status 03/15/2018 FINAL  Final  Urine culture     Status: None   Collection Time: 03/10/18  9:10 PM  Result Value Ref Range Status   Specimen Description URINE, RANDOM  Final   Special Requests NONE  Final   Culture   Final    NO  GROWTH Performed at [facility], [address], [place], [place] [address]    Report Status 03/12/2018 FINAL  Final  MRSA PCR Screening     Status: None   Collection Time: 03/11/18  5:15 AM  Result Value Ref Range Status   MRSA by PCR NEGATIVE NEGATIVE Final    Comment:        The GeneXpert MRSA Assay (FDA approved for NASAL specimens only), is one component of a comprehensive MRSA colonization surveillance program. It is not intended to diagnose MRSA infection nor to guide or monitor treatment for MRSA infections. Performed at [facility], [address] [place], [place] [address]   Culture, blood (routine x 2)     Status: None (Preliminary result)   Collection Time: 03/14/18 11:27 AM  Result Value Ref Range Status   Specimen Description BLOOD RIGHT HAND  Final   Special Requests   Final    BOTTLES DRAWN AEROBIC ONLY Blood Culture results may not be optimal due to an inadequate volume of blood received in culture bottles   Culture   Final    NO GROWTH 2 DAYS Performed at [facility], [address], [place], [place] [address]    Report Status PENDING  Incomplete  Culture, blood (routine x 2)     Status: None (Preliminary result)   Collection Time: 03/14/18 11:31 AM  Result Value Ref Range Status   Specimen Description BLOOD RIGHT WRIST  Final   Special Requests   Final    BOTTLES DRAWN AEROBIC ONLY Blood Culture adequate volume   Culture   Final    NO GROWTH 2 DAYS Performed at [facility], [address], [place], [place] [address]    Report Status PENDING  Incomplete   Coagulation Studies: No results for input(s): LABPROT, INR in the last 72 hours.  Urinalysis: No results for input(s): COLORURINE, LABSPEC, PHURINE, GLUCOSEU, HGBUR, BILIRUBINUR, KETONESUR, PROTEINUR, UROBILINOGEN, NITRITE, LEUKOCYTESUR in the last 72 hours.  Invalid input(s): APPERANCEUR    Imaging: Ct Angio Chest Pe W Or Wo Contrast  Result Date:  03/16/2018 CLINICAL DATA:  Ct chest PE. He had audible congestion (chest/pharyngeal) at baseline which continues today. CXR ordered and is pending. Lethargy. EXAM:  CT ANGIOGRAPHY CHEST WITH CONTRAST TECHNIQUE: Multidetector CT imaging of the chest was performed using the standard protocol during bolus administration of intravenous contrast. Multiplanar CT image reconstructions and MIPs were obtained to evaluate the vascular anatomy. CONTRAST:  ISOVUE-370 IOPAMIDOL (ISOVUE-370) INJECTION 76% COMPARISON:  Chest x-ray on 03/15/2018 FINDINGS: Cardiovascular: Pulmonary arteries are moderately well opacified. There is significant streak artifact from arms at patient's side. There is no acute pulmonary embolus. Pulmonary artery is enlarged, 3.9 centimeters. The heart is mildly enlarged. Bovine arch anatomy. No significant thoracic aortic atherosclerosis or aneurysm. Mediastinum/Nodes: The visualized portion of the thyroid gland has a normal appearance. Esophagus is normal in appearance. No significant mediastinal, hilar, or axillary adenopathy. Lungs/Pleura: There are dense areas of airspace filling throughout the lungs bilaterally. Bilateral small pleural effusions. Upper Abdomen: No acute abnormality. Musculoskeletal: Degenerative changes are seen in thoracic spine. Bilateral gynecomastia. Review of the MIP images confirms the above findings. IMPRESSION: 1. Moderate opacification of pulmonary arteries. No acute pulmonary embolus. Enlargement of pulmonary artery, consistent arterial hypertension. 2. Dense focal areas of airspace filling throughout the lungs bilaterally, likely infectious. 3. Small bilateral pleural effusions. 4. Bovine arch anatomy incidentally noted. Electronically Signed   By: Norva Pavlov M.D.   On: 03/16/2018 10:42   Dg Chest Port 1 View  Result Date: 03/15/2018 CLINICAL DATA:  Fever EXAM: PORTABLE CHEST 1 VIEW COMPARISON:  03/12/2018 FINDINGS: Overall improved aeration especially in the  lung bases. Progression of right upper lobe infiltrate. Improved bibasilar airspace disease. Improved lung volume. No effusion identified. Shunt tubing in the right chest again noted. IMPRESSION: Improved lung volume and improved aeration of the lungs bilaterally. Progression of right upper lobe infiltrate. Electronically Signed   By: Marlan Palau M.D.   On: 03/15/2018 11:57     Medications:   . dextrose 5 % with KCl 20 mEq / L     . baclofen  10 mg Oral TID  . clonazePAM  0.5 mg Oral Q24H  . clonazePAM  1 mg Oral BID  . collagenase   Topical Daily  . divalproex  500 mg Oral TID  . feeding supplement (PRO-STAT SUGAR FREE 64)  30 mL Oral Daily  . insulin aspart  0-9 Units Subcutaneous TID WC  . insulin detemir  10 Units Subcutaneous Daily  . mouth rinse  15 mL Mouth Rinse BID  . metoprolol tartrate  2.5 mg Intravenous Q6H  . metoprolol tartrate  50 mg Oral QHS  . metoprolol tartrate  75 mg Oral Daily  . multivitamin with minerals  1 tablet Oral Daily  . polyethylene glycol  17 g Oral Daily  . potassium chloride  40 mEq Oral Once  . QUEtiapine  125 mg Oral QHS  . QUEtiapine  150 mg Oral Daily  . selenium sulfide  1 application Topical Q T,Th,Sat-1800  . sertraline  100 mg Oral Daily   acetaminophen, albuterol, dextromethorphan-guaiFENesin, phenylephrine-shark liver oil-mineral oil-petrolatum  Patient Profile: Cody [name] is a 52 y.o. male with a PMHX notable for traumatic brain injury s/p ventriculoperitoneal shunt, spasmatic left hemiplegia, HTN, DMII, hypothyroidism, MDD, GAD, who presented to the ED with fever, abdominal pain, hypotension and worsening mental status from baseline from his SNF.   Assessment/ Plan:  1. Renal: Hypernatremia/AKI- Na improved today to 158 with 12ml/hr D5W. Now with notable hypokalemia. This was treated by Dr. Glenna Fellows with IV potassium 40 mEq as well as potassium 38mEq/L of D5W. Patients Cr stable, with persistently elevated BUN. I feel that he  remains notably volume depleted given his hypernatremia and weight loss since admission.  --The current assessment is that the patients symptoms and labs are most consistent with post obstructive solute diuresis given his urine osmolality and urine output. DI is less likely but should remain of minor consideration if he fails to improve over the following few days. Nephrology will continue to monitor him at this time.  --Q4 hour BMP's continued --Titrate D5W based on BMP results 3. Acute encephalopathy on baseline cognitive impairment - Improving. Uncertain baseline. Patient unable to feed himself or respond to verbal stimuli. As this preceded the hypernatremia it appears unrelated. As per primary team.  2. Hypertension/volume  - BP WNL's at ~125's/80. Would continue to monitor closely. Volume status is concerning as he is listed as being negative 3L and his weight is down ~6kg since admission. He has not maintained a positive fluid balance with polyuria exceeding his rate of hydration despite improvement in his hypernatremia. As above, replace and monitor closely.  4. Anemia, Normocytic - Hgb Stable at 9.7. Would consider Iron studies but feel that this may be due to some underlying mild chronic disease such as CKD. Will evaluate further.   LOS: 6 Lanelle BalLawrence Harbrecht, MD Internal Medicine PGY-2  I personally saw and evaluated the patient.  Interesting case of hypernatremia.  On admission his serum sodium and UOP were normal but have now trended towards marked hypernatremia and polyuria.  Certainly the presence of a VP shunt and the polyuria raise the question of a central DI, however given the clinical setting and evaluation to date this is most likely a solute diuresis following obstruction. He could have a component of nephrogenic DI (post obstructive injury +/- fairly marked hypokalemia).  His urine osms are > 1000/d which is consistent with solute diuresis.  We will continue to supply free water via  IV (NGT would be acceptable route, but in the short term we're already titrating the IV so we can stick with that) to correct his free water deficit which started at ~ 5.5L.  Checking q4h BMPs for Na/K today.  I suspect this will be a self limited process and as his AKI resolves the hypernatremia will resolve as well.  If not, we will re-explore need for evaluation for DI.  Estill BakesLindsay Kimisha Eunice MD

## 2018-03-17 NOTE — Progress Notes (Signed)
Pharmacy Antibiotic Note  Cody [name] is a 52 y.o. male admitted on 03/10/2018 with possible aspiration pneumonia. Pharmacy has been consulted for Unasyn dosing.Patient has been having intermittent fevers with no clear source of infection. WBC count slightly up today to 15.9 and Tmax-102.9. CrCl~ 50 ml/min.   Plan: Unasyn 3 gm every 6 hours Monitor for clinical improvement, renal function   Height: 5\' 9"  (175.3 cm) Weight: 145 lb (65.8 kg) IBW/kg (Calculated) : 70.7  Temp (24hrs), Avg:101.4 F (38.6 C), Min:99.7 F (37.6 C), Max:102.9 F (39.4 C)  Recent Labs  Lab 03/10/18 1958  03/10/18 2340  03/11/18 0624  03/13/18 0417 03/13/18 1252 03/14/18 0723 03/15/18 0504 03/16/18 0511  03/16/18 1219 03/16/18 1521 03/16/18 1945 03/17/18 0521 03/17/18 1111  WBC  --    < >  --   --   --    < > 15.5*  --  12.9* 13.2* 13.7*  --   --   --   --  15.9*  --   CREATININE  --   --   --    < >  --    < > 1.78*  --  1.73* 1.47* 1.63*   < > 1.66* 1.73* 1.78* 1.57* 1.40*  LATICACIDVEN 1.13  --  1.17  --  1.6  --   --   --   --   --   --   --   --   --   --   --   --   VANCOTROUGH  --   --   --   --   --   --   --  25*  --   --   --   --   --   --   --   --   --    < > = values in this interval not displayed.    Estimated Creatinine Clearance: 58.1 mL/min (A) (by C-G formula based on SCr of 1.4 mg/dL (H)).    Not on File  Antimicrobials this admission: Zosyn  7/24>> 7/28 Cefepime 7/28>>7/29 Vanc 7/24>>7/29 Unasyn 7/31>>   Microbiology results: 7/24 blood x 2: No growth 7/28 blood x 2>>NGTD 7/24 urine: NGTD 7/25 MRSA negative   Thank you for allowing pharmacy to be a part of this patient's care.  Della GooEmily S Sinclair , PharmD, BCPS Antimicrobial Stewardship Clinical Pharmacist Phone: [phone] 03/17/2018 12:49 PM

## 2018-03-17 NOTE — Progress Notes (Signed)
Page [facility]. Patient has fever of 102 (recheck of earlier temp). Tylenol PRN given 1 hour ago

## 2018-03-17 NOTE — Progress Notes (Signed)
03/17/18 1003  SLP Visit Information  SLP Received On 03/17/18  Pain Assessment  Pain Assessment Faces  Faces Pain Scale 6  Pain Intervention(s) Monitored during session;Repositioned  General Information  HPI Cody RanBobby [name] is a 52 y.o. 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  Chest x-ray showed bilateral basilar subsegment atelectasis. Found to have multiple nonobstructing right renal calculi measuring up to 10 mm and underwent cystoscopy, right retrograde ureteropyelogram, placement of stent. [facility] he did not aspirate or penetrate, dealyed swallow (thin liq's and appears regular texture recommended). Followed by ST, increased coughing with RN, ST downgraded to puree, continue thin. CXR 7/29 showed Dense focal areas of airspace filling throughout the lungs bilateral, likely infectious. SLP recommended MBS.  Type of Study MBS-Modified Barium Swallow Study  Previous Swallow Assessment  (see HPI)  Diet Prior to this Study Thin liquids;Dysphagia 1 (puree)  Temperature Spikes Noted Yes  Respiratory Status Nasal cannula  History of Recent Intubation Yes  Length of Intubations (days)  (during procedure)  Date extubated 03/11/18  Behavior/Cognition Alert;Cooperative;Requires cueing  Oral Cavity Assessment WFL  Oral Care Completed by SLP No  Oral Cavity - Dentition Poor condition;Missing dentition  Vision Functional for self feeding  Self-Feeding Abilities Needs assist  Patient Positioning Upright in chair  Baseline Vocal Quality Normal  Anatomy Other (Comment) (possible cervical 1-2 fusing, lower cervical hardware)  Pharyngeal Secretions Not observed secondary MBS  Oral Assessment (Complete on admission/transfer/change in patient condition)   Does patient have any of the following "high(er) risk" factors? None of the above  Does patient have any of the following "at risk" factors? Other - dysphagia  Patient is AT RISK Order set for Adult Oral Care Protocol initiated -  "At Risk Patients" option selected (see row information)  Oral Motor/Sensory Function  Overall Oral Motor/Sensory Function  (decreased coordination)  Oral Preparation/Oral Phase  Oral Phase Impaired  Oral - Thin  Oral - Thin Cup Premature spillage;Decreased bolus cohesion  Oral - Thin Straw Decreased bolus cohesion  Oral - Solids  Oral - Puree Delayed oral transit;Lingual/palatal residue  Pharyngeal Phase  Pharyngeal Phase Impaired  Pharyngeal - Thin  Pharyngeal- Thin Cup Reduced tongue base retraction;Delayed swallow initiation-pyriform sinuses;Delayed swallow initiation-vallecula  Pharyngeal- Thin Straw Delayed swallow initiation-vallecula;Delayed swallow initiation-pyriform sinuses;Penetration/Aspiration during swallow  Pharyngeal Material enters airway, remains ABOVE vocal cords then ejected out  Pharyngeal - Solids  Pharyngeal- Puree Delayed swallow initiation-vallecula  Cervical Esophageal Phase  [facility]  Clinical Impression  Clinical Impression Pt exhibits decreased oral manipulation, coordination and cohesion with thin and puree. Reduced tongue base retection led to premature spill of thin to pyriform sinuses and likely delayed timing and closure of laryngeal vestibule with penetration of thin via straw and large sip. Difficult to fully view with suboptimal positioning and reclined posture in chair from pain and shoulder obstructing view somewhat. Suspect penetration was flash or ejected with pt's spontaneous cough and vestibule and area below vocal cords was clear. Smaller sips aided in increased control and protection with thin. Recommend pt continue Dys 1 (puree), thin, straws allowed with SMALL sips, full supervision, small sips  and crush meds. Continue to follow.    SLP Visit Diagnosis Dysphagia, oropharyngeal phase (R13.12)  Impact on safety and function Mild aspiration risk;Moderate aspiration risk  Swallow Evaluation Recommendations  SLP Diet Recommendations Thin liquid;Dysphagia 1 (Puree) solids  Liquid Administration via Cup;Straw  Medication Administration Crushed with puree  Supervision Patient able to self feed;Staff to assist with self feeding;Full assist for feeding  Compensations Minimize environmental distractions  Postural Changes Remain semi-upright after after feeds/meals (Comment)  Treatment Plan  Oral Care Recommendations Oral care BID  Treatment Recommendations Therapy as outlined in treatment plan below  Follow up Recommendations None  Speech Therapy Frequency (ACUTE ONLY) min 2x/week  Treatment Duration 2 weeks  Interventions Aspiration precaution training;Patient/family education;Compensatory techniques;Diet toleration management by SLP;Trials of upgraded texture/liquids  Prognosis  Prognosis for Safe Diet Advancement Good  Barriers to Reach Goals Cognitive deficits  Individuals Consulted  Consulted and Agree with Results and Recommendations Patient  SLP Time Calculation  SLP Start Time (ACUTE ONLY) 0932  SLP Stop Time (ACUTE ONLY) 6962  SLP Time Calculation (min) (ACUTE ONLY) 20 min  SLP Evaluations  $ SLP Speech Visit 1 Visit  SLP Evaluations  $MBS Swallow 1 Procedure    Breck Coons Corn M.Ed [employer] [phone]

## 2018-03-17 NOTE — Progress Notes (Signed)
The patient developed a temp of 101.9 rectally. Treated with tylenol and reported to night shift nurse.   Sheppard Evensina Burney Calzadilla RN

## 2018-03-17 NOTE — Progress Notes (Signed)
  Speech Language Pathology  Patient Details Name: Cody [name] MRN: [id number] DOB: 02/24/1966 Today's Date: 03/17/2018 Time:  -      MBS completed. Full report will be documented. Recommend he continue with thin liquids, straws allowed with SMALL sips in upright position, Dys 1 (puree) diet, crush meds and full supervision.                      03/17/2018, 10:02 AM  Breck CoonsLisa Willis Lonell FaceLitaker M.Ed [employer]SL[employer] Pager [phone]

## 2018-03-17 NOTE — Progress Notes (Signed)
Updated [facility]. Patient still has temperature 38.4 C

## 2018-03-17 NOTE — Progress Notes (Signed)
PROGRESS NOTE    Cody [name]  QMV:[id number] DOB: Jan 15, 1966 DOA: 03/10/2018 PCP: [name], MD   Brief Narrative:  HPI On 03/11/2018 by Dr. Lorretta Harp Cody [name] is a 52 y.o. 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  Per report, pt has garbled speech and is bed bound at baseline. Pt is noted to be more confused, very lethargic and less responsive in facililty in the past 2 days. He has fever and right sided abdominal tenderness. No nausea, vomiting, diarrhea notated. Per his sister, pt seems to have dysuria. Pt was reportedly to have dry cough. He was found to be hypoxic by EMS with SpO2 in the 70s on room air, given 2 duonebs en route, Spo2 improved to 95% on 4L [place]. Not sure if he has any chest pain. He has left sided spasmatic hemiplegia. No facial droop. He moves his right extremities. He is initially hypotensive with SBP 70s, which improved to SBP>100 after 3L NS bolus in ED.  Interim history Admitted with sepsis, thought to be secondary to pyelonephritis, however continues to spike fevers. ID consulted.   Assessment & Plan   Severe sepsis secondary to ?acute pyelonephrosis secondary to obstructive uropathy vs other source with Fever  -Presented with tachycardia, fever, leukocytosis, AKI and hypotension.  Hypotension has been responsive to IV fluids. -CT abdomen/pelvis showed right obstructive uropathy with 7 x 5 mm right ureterovesicular junction stone causing mild hydroureteronephrosis and perinephric stranding.  Multiple nonobstructing right renal calculi measuring up to 10 mm. -Blood cultures 03/10/2018 show no growth to date -Urine culture shows no growth -was placed on vancomycin and Zosyn -Urology consulted and appreciated, status post  cystoscopy with right retrograde ureteropyelogram, fluoroscopic interpretation, placement of 6 French by 24 similar contour double-J stent without tether -Urology recommended that if there was inadequate drainage of the right renal uint, to consult IR for percutaneous nephrostomy tube placement  -Continues to spike fevers and have tachycardia. Discussed with ID, recommended CT abd/pelvis, and change zosyn to cefepime.  -Repeat CT abd/pelvis with contrast showed interval placement of right ureteral stent with 2 small stone fragment persisting at right UPJ. Mild persistent but improved right hydronephrosis. ?Gallstone. ?Pneumonia. VP shunt catheter tip in the LUQ abutting the dome of the liver, no adjacent fluid collection; no free fluid or free air in the abdomen or pelvis.  -discontinued antibiotics  -CXR showed RUL infiltrate  -CTA chest obtained; no acute pulmonary embolism.  Dense focal areas of airspace filling throughout the lungs bilaterally, likely infectious. -lower ext doppler negative for DVT -Infectious disease consulted and appreciated, agreed with holding antibiotics.  Discussed findings of CT a with Dr. Luciana Axe- recommended continuing to monitor patient off of antibiotics.  -Discussed with Dr. Wynetta Emery, would not want to tap the shunt as this may introduce infection -Repeat blood cultures 03/14/2018 showed no growth to date -no changes at this time --he is afebrile  Acute kidney injury secondary to obstructive uropathy and hydroureteronephrosis vs CKD -Creatinine on admission was 2.07, has improved to 127 -Baseline creatinine 0.8 in 2011 -Treatment plan as above  Acute metabolic encephalopathy -Suspect secondary to sepsis vs psych meds vs severe hypernatremia  -CT head unremarkable for acute intercranial normalities, however showed slight interval enlargement of the ventricular system as compared with 2009 CT, it is unclear if this is secondary to progression of atrophy  and volume loss  versus some degree of shunt malfunction -Neurosurgery consulted and appreciated  Acute respiratory failure with hypoxia -Etiology unclear -CXR and CTA chest as above -Continue nebulizer treatments and supplemental oxygen  Hypernatremia hypovolemic vs compoenet Central DI -was on NS, have switched to D5W and rate increased to 160ml/hr -despite switching IVF, sodium continued to rse -fluids not changed per order -appreciate nephro guidance-no free water, cont d5 + k  Normocytic anemia -Hemoglobin 9.7 -no recent previous labs for comparison.  Hemoglobin was 16 back in 2011.  -Continue to monitor CBC  Essential hypertension -Continue metoprolol, continue IV hydralazine as needed  Sinus tachycardia -suspect secondary to sepsis vs pain -HR controlled, currently in the 90s -Continue IV metoprolol along with oral metoprolol  Diabetes mellitus, type II -Hemoglobin A1c  5.4 -Continue Levemir, insulin sliding scale and CBG monitoring  Spastic hemiplegia secondary to traumatic brain injury -Continue baclofen -As above, neurosurgery consulted and appreciated  Depression and anxiety -Continue Klonopin, Seroquel, Zoloft, Depakote  DVT Prophylaxis  SCDs  Code Status: Full  Family Communication: Discussed with sister via phone  Disposition Plan: Admitted. Pending improvement. Dispo SNF  Consultants Urology  Neurosurgery Infectious disease, Dr. Luciana Axe, via phone  Procedures  Cystoscopy with right retrograde ureteropyelogram, fluoroscopic interpretation, placement of 6 French by 24 similar contour double-J stent without tether  Antibiotics   Anti-infectives (From admission, onward)   Start     Dose/Rate Route Frequency Ordered Stop   03/17/18 1400  Ampicillin-Sulbactam (UNASYN) 3 g in sodium chloride 0.9 % 100 mL IVPB     3 g 200 mL/hr over 30 Minutes Intravenous Every 6 hours 03/17/18 1300     03/14/18 1430  ceFEPIme (MAXIPIME) 2 g in sodium chloride 0.9 % 100 mL IVPB   Status:  Discontinued     2 g 200 mL/hr over 30 Minutes Intravenous Every 24 hours 03/14/18 1136 03/15/18 0956   03/14/18 0100  vancomycin (VANCOCIN) 500 mg in sodium chloride 0.9 % 100 mL IVPB  Status:  Discontinued     500 mg 100 mL/hr over 60 Minutes Intravenous Every 12 hours 03/13/18 1510 03/15/18 0956   03/11/18 2000  vancomycin (VANCOCIN) IVPB 1000 mg/200 mL premix  Status:  Discontinued     1,000 mg 200 mL/hr over 60 Minutes Intravenous Every 24 hours 03/10/18 2053 03/11/18 1342   03/11/18 1400  vancomycin (VANCOCIN) IVPB 750 mg/150 ml premix  Status:  Discontinued     750 mg 150 mL/hr over 60 Minutes Intravenous Every 12 hours 03/11/18 1342 03/13/18 1510   03/11/18 0400  piperacillin-tazobactam (ZOSYN) IVPB 3.375 g  Status:  Discontinued     3.375 g 12.5 mL/hr over 240 Minutes Intravenous Every 8 hours 03/10/18 2053 03/14/18 1136   03/10/18 2030  piperacillin-tazobactam (ZOSYN) IVPB 3.375 g     3.375 g 100 mL/hr over 30 Minutes Intravenous  Once 03/10/18 2023 03/10/18 2139   03/10/18 2030  vancomycin (VANCOCIN) IVPB 1000 mg/200 mL premix     1,000 mg 200 mL/hr over 60 Minutes Intravenous  Once 03/10/18 2023 03/10/18 2208      Subjective:   Awakens-noted dense carpo-pedal spasm L side-doesn't make sense but is able to mumble-amakes good eye contact despite expressive deficit  Objective:   Vitals:   03/17/18 1100 03/17/18 1700 03/17/18 1838 03/17/18 2024  BP: 122/84   124/86  Pulse: (!) 101   (!) 118  Resp: (!) 28   (!) 23  Temp:  100.2 F (37.9 C) (!) 101.9  F (38.8 C) (!) 100.7 F (38.2 C)  TempSrc:  Axillary Rectal Axillary  SpO2: 95%   96%  Weight:      Height:        Intake/Output Summary (Last 24 hours) at 03/17/2018 2147 Last data filed at 03/17/2018 1911 Gross per 24 hour  Intake 2649.45 ml  Output 3775 ml  Net -1125.55 ml   Filed Weights   03/15/18 0531 03/16/18 0427 03/17/18 0402  Weight: 70.3 kg (154 lb 15.7 oz) 66.2 kg (146 lb) 65.8 kg (145 lb)     Exam (no change from physical exam on 03/15/2018)  General: chronically ill appearing, NAD  HEENT: NCAT,  mucous membranes very dry and tented skin  carpo-pedal spasm, some facial twsit  Neck: Supple  Cardiovascular: S1 S2 auscultated, RRR  Resp-clea rbut poor exno deubitus Data Reviewed: I have personally reviewed following labs and imaging studies  CBC: Recent Labs  Lab 03/10/18 2332  03/13/18 0417 03/14/18 0723 03/15/18 0504 03/16/18 0511 03/17/18 0521  WBC 15.5*   < > 15.5* 12.9* 13.2* 13.7* 15.9*  NEUTROABS 11.0*  --   --   --   --   --   --   HGB 10.0*   < > 10.3* 8.9* 9.0* 9.7* 9.6*  HCT 32.6*   < > 33.3* 29.3* 30.1* 32.0* 31.5*  MCV 86.7   < > 85.8 85.9 86.0 85.3 86.8  PLT 148*   < > 143* 162 165 203 192   < > = values in this interval not displayed.   Basic Metabolic Panel: Recent Labs  Lab 03/14/18 0723  03/16/18 1521 03/16/18 1945 03/17/18 0521 03/17/18 1111 03/17/18 1630  NA 154*   < > 164* 160* 158* 157* 157*  K 3.1*   < > 3.1* 3.3* 2.8* 3.7 3.3*  CL 124*   < > 124* 127* 123* 122* 121*  CO2 22   < > 26 26 25 25 26   GLUCOSE 104*   < > 136* 148* 162* 163* 116*  BUN 33*   < > 36* 36* 31* 27* 25*  CREATININE 1.73*   < > 1.73* 1.78* 1.57* 1.40* 1.27*  CALCIUM 7.5*   < > 7.4* 7.1* 7.0* 7.0* 7.1*  MG 2.4  --   --   --   --   --   --    < > = values in this interval not displayed.   GFR: Estimated Creatinine Clearance: 64 mL/min (A) (by C-G formula based on SCr of 1.27 mg/dL (H)). Liver Function Tests: Recent Labs  Lab 03/15/18 0504  AST 39  ALT 24  ALKPHOS 102  BILITOT 0.9  PROT 7.3  ALBUMIN 1.6*   No results for input(s): LIPASE, AMYLASE in the last 168 hours. No results for input(s): AMMONIA in the last 168 hours. Coagulation Profile: Recent Labs  Lab 03/10/18 2332  INR 1.41   Cardiac Enzymes: No results for input(s): CKTOTAL, CKMB, CKMBINDEX, TROPONINI in the last 168 hours. BNP (last 3 results) No results for input(s): PROBNP in  the last 8760 hours. HbA1C: No results for input(s): HGBA1C in the last 72 hours. CBG: Recent Labs  Lab 03/16/18 2059 03/17/18 0758 03/17/18 1220 03/17/18 1704 03/17/18 2042  GLUCAP 125* 128* 144* 121* 146*   Lipid Profile: No results for input(s): CHOL, HDL, LDLCALC, TRIG, CHOLHDL, LDLDIRECT in the last 72 hours. Thyroid Function Tests: No results for input(s): TSH, T4TOTAL, FREET4, T3FREE, THYROIDAB in the last 72 hours. Anemia Panel: No results  for input(s): VITAMINB12, FOLATE, FERRITIN, TIBC, IRON, RETICCTPCT in the last 72 hours. Urine analysis:    Component Value Date/Time   COLORURINE YELLOW 03/10/2018 2110   APPEARANCEUR HAZY (A) 03/10/2018 2110   LABSPEC 1.023 03/10/2018 2110   PHURINE 5.0 03/10/2018 2110   GLUCOSEU NEGATIVE 03/10/2018 2110   HGBUR LARGE (A) 03/10/2018 2110   BILIRUBINUR NEGATIVE 03/10/2018 2110   KETONESUR NEGATIVE 03/10/2018 2110   PROTEINUR 100 (A) 03/10/2018 2110   UROBILINOGEN 1.0 11/20/2009 1327   NITRITE NEGATIVE 03/10/2018 2110   LEUKOCYTESUR SMALL (A) 03/10/2018 2110   Sepsis Labs: @LABRCNTIP (procalcitonin:4,lacticidven:4)  ) Recent Results (from the past 240 hour(s))  Culture, blood (Routine x 2)     Status: None   Collection Time: 03/10/18  7:46 PM  Result Value Ref Range Status   Specimen Description BLOOD RIGHT ANTECUBITAL  Final   Special Requests   Final    BOTTLES DRAWN AEROBIC AND ANAEROBIC Blood Culture adequate volume   Culture   Final    NO GROWTH 5 DAYS Performed at [facility], [address] [place], [place] [address]    Report Status 03/15/2018 FINAL  Final  Culture, blood (Routine x 2)     Status: None   Collection Time: 03/10/18  8:35 PM  Result Value Ref Range Status   Specimen Description BLOOD RIGHT HAND  Final   Special Requests   Final    BOTTLES DRAWN AEROBIC ONLY Blood Culture results may not be optimal due to an inadequate volume of blood received in culture bottles   Culture   Final    NO  GROWTH 5 DAYS Performed at [facility], [address] [place], [place] [address]    Report Status 03/15/2018 FINAL  Final  Urine culture     Status: None   Collection Time: 03/10/18  9:10 PM  Result Value Ref Range Status   Specimen Description URINE, RANDOM  Final   Special Requests NONE  Final   Culture   Final    NO GROWTH Performed at [facility], [address] [place], [place] [address]    Report Status 03/12/2018 FINAL  Final  MRSA PCR Screening     Status: None   Collection Time: 03/11/18  5:15 AM  Result Value Ref Range Status   MRSA by PCR NEGATIVE NEGATIVE Final    Comment:        The GeneXpert MRSA Assay (FDA approved for NASAL specimens only), is one component of a comprehensive MRSA colonization surveillance program. It is not intended to diagnose MRSA infection nor to guide or monitor treatment for MRSA infections. Performed at [facility], [address] [place], [place] [address]   Culture, blood (routine x 2)     Status: None (Preliminary result)   Collection Time: 03/14/18 11:27 AM  Result Value Ref Range Status   Specimen Description BLOOD RIGHT HAND  Final   Special Requests   Final    BOTTLES DRAWN AEROBIC ONLY Blood Culture results may not be optimal due to an inadequate volume of blood received in culture bottles   Culture   Final    NO GROWTH 3 DAYS Performed at [facility], [address] [place], [place] [address]    Report Status PENDING  Incomplete  Culture, blood (routine x 2)     Status: None (Preliminary result)   Collection Time: 03/14/18 11:31 AM  Result Value Ref Range Status   Specimen  Description BLOOD RIGHT WRIST  Final   Special Requests   Final    BOTTLES DRAWN AEROBIC ONLY Blood Culture adequate volume   Culture   Final    NO GROWTH 3 DAYS Performed at [facility], [address] [place], [place] [address]    Report Status PENDING  Incomplete      Radiology Studies: Dg Swallowing  Func-speech Pathology  Result Date: 03/17/2018 Objective Swallowing Evaluation: Type of Study: MBS-Modified Barium Swallow Study  Patient Details Name: Cody [name] MRN: [id number] Date of Birth: 1966/07/30 Today's Date: 03/17/2018 Time: SLP Start Time (ACUTE ONLY): 0932 -SLP Stop Time (ACUTE ONLY): 0952 SLP Time Calculation (min) (ACUTE ONLY): 20 min Past Medical History: Past Medical History: Diagnosis Date . Depression  . Diabetes mellitus without complication (HCC)  . GAD (generalized anxiety disorder)  . Hypertension  . Hypothyroidism  . Spastic hemiplegia (HCC)  . Thyroid disease   hypothyroidism Past Surgical History: Past Surgical History: Procedure Laterality Date . CYSTOSCOPY W/ URETERAL STENT PLACEMENT Right 03/11/2018  Procedure: CYSTOSCOPY WITH RETROGRADE PYELOGRAM/DOUBLE J STENT PLACEMENT;  Surgeon: [name], MD;  Location: [facility];  Service: Urology;  Laterality: Right; . VENTRICULOPERITONEAL SHUNT   HPI: Trueman Worlds is a 52 y.o. 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  Chest x-ray showed bilateral basilar subsegment atelectasis. Found to have multiple nonobstructing right renal calculi measuring up to 10 mm and underwent cystoscopy, right retrograde ureteropyelogram, placement of stent. MBS at [facility] end of 2018 he did not aspirate or penetrate, dealyed swallow (thin liq's and appears regular texture recommended). Followed by ST, increased coughing with RN, ST downgraded to puree, continue thin. CXR 7/29 showed Dense focal areas of airspace filling throughout the lungs bilateral, likely infectious. SLP recommended MBS.  No data recorded Assessment / Plan / Recommendation CHL IP CLINICAL IMPRESSIONS 03/17/2018 Clinical Impression Pt exhibits decreased oral  manipulation, coordination and cohesion with thin and puree. Reduced tongue base retection led to premature spill of thin to pyriform sinuses and likely delayed timing and closure of laryngeal vestibule with penetration of thin via straw and large sip. Difficult to fully view with suboptimal positioning and reclined posture in chair from pain and shoulder obstructing view somewhat. Suspect penetration was flash or ejected with pt's spontaneous cough and vestibule and area below vocal cords was clear. Smaller sips aided in increased control and protection with thin. Recommend pt continue Dys 1 (puree), thin, straws allowed with SMALL sips, full supervision, small sips and crush meds. Continue to follow.   SLP Visit Diagnosis Dysphagia, oropharyngeal phase (R13.12) Attention and concentration deficit following -- Frontal lobe and executive function deficit following -- Impact on safety and function Mild aspiration risk;Moderate aspiration risk   CHL IP TREATMENT RECOMMENDATION 03/17/2018 Treatment Recommendations Therapy as outlined in treatment plan below   Prognosis 03/17/2018 Prognosis for Safe Diet Advancement Good Barriers to Reach Goals Cognitive deficits Barriers/Prognosis Comment -- CHL IP DIET RECOMMENDATION 03/17/2018 SLP Diet Recommendations Thin liquid;Dysphagia 1 (Puree) solids Liquid Administration via Cup;Straw Medication Administration Crushed with puree Compensations Minimize environmental distractions Postural Changes Remain semi-upright after after feeds/meals (Comment)   CHL IP OTHER RECOMMENDATIONS 03/17/2018 Recommended Consults -- Oral Care Recommendations Oral care BID Other Recommendations --   CHL IP FOLLOW UP RECOMMENDATIONS 03/17/2018 Follow up Recommendations None   CHL IP FREQUENCY AND DURATION 03/17/2018 Speech  Therapy Frequency (ACUTE ONLY) min 2x/week Treatment Duration 2 weeks      CHL IP ORAL PHASE 03/17/2018 Oral Phase Impaired Oral - Pudding Teaspoon -- Oral - Pudding Cup -- Oral - Honey  Teaspoon -- Oral - Honey Cup -- Oral - Nectar Teaspoon -- Oral - Nectar Cup -- Oral - Nectar Straw -- Oral - Thin Teaspoon -- Oral - Thin Cup Premature spillage;Decreased bolus cohesion Oral - Thin Straw Decreased bolus cohesion Oral - Puree Delayed oral transit;Lingual/palatal residue Oral - Mech Soft -- Oral - Regular -- Oral - Multi-Consistency -- Oral - Pill -- Oral Phase - Comment --  CHL IP PHARYNGEAL PHASE 03/17/2018 Pharyngeal Phase Impaired Pharyngeal- Pudding Teaspoon -- Pharyngeal -- Pharyngeal- Pudding Cup -- Pharyngeal -- Pharyngeal- Honey Teaspoon -- Pharyngeal -- Pharyngeal- Honey Cup -- Pharyngeal -- Pharyngeal- Nectar Teaspoon -- Pharyngeal -- Pharyngeal- Nectar Cup -- Pharyngeal -- Pharyngeal- Nectar Straw -- Pharyngeal -- Pharyngeal- Thin Teaspoon -- Pharyngeal -- Pharyngeal- Thin Cup Reduced tongue base retraction;Delayed swallow initiation-pyriform sinuses;Delayed swallow initiation-vallecula Pharyngeal -- Pharyngeal- Thin Straw Delayed swallow initiation-vallecula;Delayed swallow initiation-pyriform sinuses;Penetration/Aspiration during swallow Pharyngeal Material enters airway, remains ABOVE vocal cords then ejected out Pharyngeal- Puree Delayed swallow initiation-vallecula Pharyngeal -- Pharyngeal- Mechanical Soft -- Pharyngeal -- Pharyngeal- Regular -- Pharyngeal -- Pharyngeal- Multi-consistency -- Pharyngeal -- Pharyngeal- Pill -- Pharyngeal -- Pharyngeal Comment --  CHL IP CERVICAL ESOPHAGEAL PHASE 03/17/2018 Cervical Esophageal Phase WFL Pudding Teaspoon -- Pudding Cup -- Honey Teaspoon -- Honey Cup -- Nectar Teaspoon -- Nectar Cup -- Nectar Straw -- Thin Teaspoon -- Thin Cup -- Thin Straw -- Puree -- Mechanical Soft -- Regular -- Multi-consistency -- Pill -- Cervical Esophageal Comment -- Royce Macadamia 03/17/2018, 2:22 PM                Scheduled Meds: . baclofen  10 mg Oral TID  . clonazePAM  0.5 mg Oral Q24H  . clonazePAM  1 mg Oral BID  . collagenase   Topical Daily  .  divalproex  500 mg Oral TID  . feeding supplement (PRO-STAT SUGAR FREE 64)  30 mL Oral Daily  . insulin aspart  0-9 Units Subcutaneous TID WC  . insulin detemir  10 Units Subcutaneous Daily  . mouth rinse  15 mL Mouth Rinse BID  . metoprolol tartrate  2.5 mg Intravenous Q6H  . metoprolol tartrate  50 mg Oral QHS  . metoprolol tartrate  75 mg Oral Daily  . multivitamin with minerals  1 tablet Oral Daily  . polyethylene glycol  17 g Oral Daily  . QUEtiapine  125 mg Oral QHS  . QUEtiapine  150 mg Oral Daily  . selenium sulfide  1 application Topical Q T,Th,Sat-1800  . sertraline  100 mg Oral Daily   Continuous Infusions: . sodium chloride    . ampicillin-sulbactam (UNASYN) IV 3 g (03/17/18 2119)  . dextrose 5 % with KCl 20 mEq / L 175 mL/hr at 03/17/18 1911     LOS: 6 days   Pleas Koch, MD [facility] [phone]

## 2018-03-18 ENCOUNTER — Inpatient Hospital Stay (HOSPITAL_COMMUNITY): Payer: Medicare Other

## 2018-03-18 DIAGNOSIS — E870 Hyperosmolality and hypernatremia: Secondary | ICD-10-CM

## 2018-03-18 LAB — BASIC METABOLIC PANEL
ANION GAP: 10 (ref 5–15)
ANION GAP: 6 (ref 5–15)
ANION GAP: 8 (ref 5–15)
ANION GAP: 9 (ref 5–15)
ANION GAP: 9 (ref 5–15)
Anion gap: 7 (ref 5–15)
BUN: 16 mg/dL (ref 6–20)
BUN: 16 mg/dL (ref 6–20)
BUN: 18 mg/dL (ref 6–20)
BUN: 19 mg/dL (ref 6–20)
BUN: 21 mg/dL — ABNORMAL HIGH (ref 6–20)
BUN: 23 mg/dL — ABNORMAL HIGH (ref 6–20)
CALCIUM: 7 mg/dL — AB (ref 8.9–10.3)
CALCIUM: 6.7 mg/dL — AB (ref 8.9–10.3)
CALCIUM: 6.9 mg/dL — AB (ref 8.9–10.3)
CHLORIDE: 118 mmol/L — AB (ref 98–111)
CO2: 26 mmol/L (ref 22–32)
CO2: 24 mmol/L (ref 22–32)
CO2: 27 mmol/L (ref 22–32)
CO2: 27 mmol/L (ref 22–32)
CO2: 28 mmol/L (ref 22–32)
CO2: 25 mmol/L (ref 22–32)
CREATININE: 1.1 mg/dL (ref 0.61–1.24)
Calcium: 7 mg/dL — ABNORMAL LOW (ref 8.9–10.3)
Calcium: 6.9 mg/dL — ABNORMAL LOW (ref 8.9–10.3)
Calcium: 6.9 mg/dL — ABNORMAL LOW (ref 8.9–10.3)
Chloride: 118 mmol/L — ABNORMAL HIGH (ref 98–111)
Chloride: 117 mmol/L — ABNORMAL HIGH (ref 98–111)
Chloride: 118 mmol/L — ABNORMAL HIGH (ref 98–111)
Chloride: 116 mmol/L — ABNORMAL HIGH (ref 98–111)
Chloride: 114 mmol/L — ABNORMAL HIGH (ref 98–111)
Creatinine, Ser: 1.25 mg/dL — ABNORMAL HIGH (ref 0.61–1.24)
Creatinine, Ser: 1.24 mg/dL (ref 0.61–1.24)
Creatinine, Ser: 1.09 mg/dL (ref 0.61–1.24)
Creatinine, Ser: 1.15 mg/dL (ref 0.61–1.24)
Creatinine, Ser: 1.04 mg/dL (ref 0.61–1.24)
GFR calc Af Amer: >60 mL/min (ref >60)
GFR calc Af Amer: >60 mL/min (ref >60)
GFR calc non Af Amer: >60 mL/min (ref >60)
GFR calc non Af Amer: >60 mL/min (ref >60)
GFR calc non Af Amer: >60 mL/min (ref >60)
GLUCOSE: 110 mg/dL — AB (ref 70–99)
GLUCOSE: 140 mg/dL — AB (ref 70–99)
GLUCOSE: 142 mg/dL — AB (ref 70–99)
GLUCOSE: 165 mg/dL — AB (ref 70–99)
GLUCOSE: 174 mg/dL — AB (ref 70–99)
Glucose, Bld: 136 mg/dL — ABNORMAL HIGH (ref 70–99)
POTASSIUM: 3 mmol/L — AB (ref 3.5–5.1)
POTASSIUM: 3 mmol/L — AB (ref 3.5–5.1)
POTASSIUM: 3.2 mmol/L — AB (ref 3.5–5.1)
POTASSIUM: 4.3 mmol/L (ref 3.5–5.1)
POTASSIUM: 6 mmol/L — AB (ref 3.5–5.1)
Potassium: 3.6 mmol/L (ref 3.5–5.1)
SODIUM: 148 mmol/L — AB (ref 135–145)
Sodium: 153 mmol/L — ABNORMAL HIGH (ref 135–145)
Sodium: 153 mmol/L — ABNORMAL HIGH (ref 135–145)
Sodium: 151 mmol/L — ABNORMAL HIGH (ref 135–145)
Sodium: 152 mmol/L — ABNORMAL HIGH (ref 135–145)
Sodium: 150 mmol/L — ABNORMAL HIGH (ref 135–145)

## 2018-03-18 LAB — CBC WITH DIFFERENTIAL/PLATELET
Abs Immature Granulocytes: 0.5 10*3/uL — ABNORMAL HIGH (ref 0.0–0.1)
BASOS PCT: 0 %
Basophils Absolute: 0.1 10*3/uL (ref 0.0–0.1)
EOS ABS: 0.3 10*3/uL (ref 0.0–0.7)
EOS PCT: 2 %
HEMATOCRIT: 29.1 % — AB (ref 39.0–52.0)
Hemoglobin: 8.8 g/dL — ABNORMAL LOW (ref 13.0–17.0)
Immature Granulocytes: 3 %
LYMPHS ABS: 4.2 10*3/uL — AB (ref 0.7–4.0)
Lymphocytes Relative: 24 %
MCH: 26 pg (ref 26.0–34.0)
MCHC: 30.2 g/dL (ref 30.0–36.0)
MCV: 86.1 fL (ref 78.0–100.0)
Monocytes Absolute: 1.2 10*3/uL — ABNORMAL HIGH (ref 0.1–1.0)
Monocytes Relative: 7 %
NEUTROS PCT: 64 %
Neutro Abs: 11.6 10*3/uL — ABNORMAL HIGH (ref 1.7–7.7)
PLATELETS: 194 10*3/uL (ref 150–400)
RBC: 3.38 MIL/uL — AB (ref 4.22–5.81)
RDW: 18 % — ABNORMAL HIGH (ref 11.5–15.5)
WBC: 18 10*3/uL — AB (ref 4.0–10.5)

## 2018-03-18 LAB — GLUCOSE, CAPILLARY
GLUCOSE-CAPILLARY: 119 mg/dL — AB (ref 70–99)
Glucose-Capillary: 149 mg/dL — ABNORMAL HIGH (ref 70–99)
Glucose-Capillary: 114 mg/dL — ABNORMAL HIGH (ref 70–99)
Glucose-Capillary: 124 mg/dL — ABNORMAL HIGH (ref 70–99)

## 2018-03-18 MED ORDER — POTASSIUM CL IN DEXTROSE 5% 20 MEQ/L IV SOLN
20.0000 meq | INTRAVENOUS | Status: DC
  Administered 2018-03-18: 20 meq via INTRAVENOUS
  Filled 2018-03-18 (×2): qty 1000

## 2018-03-18 MED ORDER — DEXTROSE 5 % IV SOLN
INTRAVENOUS | Status: AC
  Administered 2018-03-18 – 2018-03-19 (×2): via INTRAVENOUS

## 2018-03-18 MED ORDER — PIPERACILLIN-TAZOBACTAM 3.375 G IVPB
3.3750 g | Freq: Three times a day (TID) | INTRAVENOUS | Status: DC
  Administered 2018-03-18 – 2018-03-19 (×3): 3.375 g via INTRAVENOUS
  Filled 2018-03-18 (×3): qty 50

## 2018-03-18 MED ORDER — POTASSIUM CHLORIDE 20 MEQ/15ML (10%) PO SOLN
20.0000 meq | Freq: Two times a day (BID) | ORAL | Status: AC
  Administered 2018-03-18 (×2): 20 meq via ORAL
  Filled 2018-03-18 (×2): qty 15

## 2018-03-18 NOTE — Progress Notes (Addendum)
[facility] PROGRESS NOTE  Cody RanBobby Dy ZOX:[id number] DOB: 11/29/1965 DOA: 03/10/2018 PCP: Shelbie AmmonsHaque, Imran P, MD   Narrative: 52 year old male TBI, spastic left hemiplegia DM TY 2, hypothyroid, bipolar Admitted 03/11/2018 increasing lethargy X 48 hours Hypoxic 70s no facial droop hypotensive systolic to 70s Admitted with sepsis protocol started-found to have AKI creatinine 2.1 BUN 40 T-max 102.6 WBC 15 CT abdomen pelvis right obstructive uropathy 7X5 UVJ stone CT head = extensive encephalomalacia slight interval enlargement of the ventricular system compared to 2009 CT Admitted with presumed sepsis from acute pyelonephritis started on Vanco Zosyn given 4 L of fluid and kept on stepdown unit Urologist was consulted underwent cystoscopy and placement of 6 French X 24 cm JJ stent 7/25 Hospitalization complicated by persisting fever and sepsis  A & Plan Sepsis-multiple components-treated initially with Vanco Zosyn and ID consulted as persisting fevers Zosyn >Unasyn  as Unasyn contains more salt-- CXR signs and symptoms of aspiration-SLP has seen-thin liquids, DYS 1 diet-mentation has improved Leukocytosis persists and will need outpatient neurology/neurosurgery input  Severe hypernatremia-admit serum sodium urine output normal and have trended towards marked hyponatremia and polyuria-?  VP shunt versus nephrogenic-unclear-nephrology assisting with free water plus K replacement Sodium has trended downward over the past couple of days from 165-151 and as a result mentation markedly is improved  TBI with resulting left hemiplegia-continue baclofen 10 3 times daily, Klonopin 0.5 q. 24, Depakote 500 3 times daily  Bipolar-continue Seroquel 125 at bedtime 150 daily and Zoloft 100 at bedtime  Diabetes mellitus type 2-continue Levemir daily and units and sliding scale is controlled 119-180  Acute metabolic encephalopathy-resolved  Acute respiratory failure-chest x-ray as above  Normocytic  anemia stable  Hypertension continue metoprolol 75 daily and 50 8 PM     DVT prophylaxis: lovenoex  Code Status: F   Family Communication: caleld sister 7/31, 8/1 and no one answers disposition Plan: Skilled care in the next 2 to 3 days depending on sodium and mentation   Mahala MenghiniSamtani, MD  [facility] Direct contact: [phone] --Via amion app OR  --[email or web address]; password [email or web address]  7PM-7AM contact night coverage as above 03/18/2018, 1:25 PM  LOS: 7 days   Consultants:  Multiple  Procedures:  None  Antimicrobials:  Currently Unasyn  Interval history/Subjective: More awake alert interactive does not like it when I raise his leg Not able to feed himself but is able to make more appropriate responses  Objective:  Vitals:  Vitals:   03/18/18 0803 03/18/18 1126  BP: 119/81 111/86  Pulse: 94 (!) 108  Resp: (!) 25 (!) 23  Temp: 99.9 F (37.7 C) 99.1 F (37.3 C)  SpO2: 96% 97%    Exam:  Awake alert to some degree dense hemiparesis left side with mild facial twisting no icterus no pallor poor dentition S1-S2 no murmur Abdomen soft nontender no rebound Chest is clear No lower extremity edema No rash has a Prevalon boot on the left leg Did not examine sacrum   I have personally reviewed the following:   Labs:  Sodium 151 potassium 3.2 chloride 117 BUN/creatinine 18/1.0  WBC 18 hemoglobin 8.8 MCV 86  Imaging studies:  CXR 8/1 = slightly increased RUL opacity  Medical tests:  reviewed   Test discussed with performing physician:  no  Decision to obtain old records:  no  Review and summation of old records:  no  Scheduled Meds: . baclofen  10 mg Oral TID  . clonazePAM  0.5 mg Oral Q24H  . clonazePAM  1  mg Oral BID  . collagenase   Topical Daily  . divalproex  500 mg Oral TID  . feeding supplement (PRO-STAT SUGAR FREE 64)  30 mL Oral Daily  . insulin aspart  0-9 Units Subcutaneous TID WC  . insulin detemir  10 Units Subcutaneous Daily  .  mouth rinse  15 mL Mouth Rinse BID  . metoprolol tartrate  2.5 mg Intravenous Q6H  . metoprolol tartrate  50 mg Oral QHS  . metoprolol tartrate  75 mg Oral Daily  . multivitamin with minerals  1 tablet Oral Daily  . polyethylene glycol  17 g Oral Daily  . potassium chloride  20 mEq Oral BID  . QUEtiapine  125 mg Oral QHS  . QUEtiapine  150 mg Oral Daily  . selenium sulfide  1 application Topical Q T,Th,Sat-1800  . sertraline  100 mg Oral Daily   Continuous Infusions: . sodium chloride 10 mL/hr at 03/17/18 2100  . ampicillin-sulbactam (UNASYN) IV 200 mL/hr at 03/18/18 1100  . dextrose 5 % with KCl 20 mEq / L 200 mL/hr at 03/18/18 1100    Principal Problem:   Acute pyelonephritis Active Problems:   Depression   Diabetes mellitus without complication (HCC)   Hypertension   Spastic hemiplegia (HCC)   Sepsis (HCC)   AKI (acute kidney injury) (HCC)   Hydroureteronephrosis-right   Ureterovesical junction (UVJ) obstruction   Acute metabolic encephalopathy   Acute respiratory failure with hypoxia (HCC)   Pressure injury of skin   LOS: 7 days

## 2018-03-18 NOTE — Progress Notes (Signed)
[facility]  Date of Admission:  03/10/2018     Total days of antibiotics 1         ASSESSMENT/PLAN  Fever - Continues to have fever with max temperature of 101.9 with temperature curve appearing to be slowly decreasing. He is on Day 2 of Unasyn with chest x-ray today concerning for pneumonia. He is clinically more alert today with no adventitious lung sounds on exam. Reasonable given the fevers to continue current dose Unasyn for now. Recommend tentative course of 5-7 days at present.   Hypernatremia - Improving today down to 151. Nephrology suspects osmotic diuresis. Continue to monitor.   Principal Problem:   Acute pyelonephritis Active Problems:   Depression   Diabetes mellitus without complication (HCC)   Hypertension   Spastic hemiplegia (HCC)   Sepsis (HCC)   AKI (acute kidney injury) (HCC)   Hydroureteronephrosis-right   Ureterovesical junction (UVJ) obstruction   Acute metabolic encephalopathy   Acute respiratory failure with hypoxia (HCC)   Pressure injury of skin   . baclofen  10 mg Oral TID  . clonazePAM  0.5 mg Oral Q24H  . clonazePAM  1 mg Oral BID  . collagenase   Topical Daily  . divalproex  500 mg Oral TID  . feeding supplement (PRO-STAT SUGAR FREE 64)  30 mL Oral Daily  . insulin aspart  0-9 Units Subcutaneous TID WC  . insulin detemir  10 Units Subcutaneous Daily  . mouth rinse  15 mL Mouth Rinse BID  . metoprolol tartrate  2.5 mg Intravenous Q6H  . metoprolol tartrate  50 mg Oral QHS  . metoprolol tartrate  75 mg Oral Daily  . multivitamin with minerals  1 tablet Oral Daily  . polyethylene glycol  17 g Oral Daily  . potassium chloride  20 mEq Oral BID  . QUEtiapine  125 mg Oral QHS  . QUEtiapine  150 mg Oral Daily  . selenium sulfide  1 application Topical Q T,Th,Sat-1800  . sertraline  100 mg Oral Daily    SUBJECTIVE:  Febrile overnight with max temperature of 101.9. WBC count has increased to 18. Continues to  have hypernatremia at 151. Hemoglobin now at 8.8 down from 9.6. Chest x-ray reviewed with slight increased right upper lobe airspace opacity. Mild-moderate aspiration risk on modified barium swallow test. Seen by nephrology today with suspicion for osmotic diuresis following obstruction.    Not on File   Review of Systems: Review of Systems  Unable to perform ROS: Mental acuity      OBJECTIVE: Vitals:   03/18/18 0022 03/18/18 0508 03/18/18 0539 03/18/18 0803  BP: 107/70 123/81  119/81  Pulse: (!) 101 (!) 103  94  Resp: (!) 25 (!) 26  (!) 25  Temp: 99 F (37.2 C) (!) 100.8 F (38.2 C)  99.9 F (37.7 C)  TempSrc: Axillary Axillary  Rectal  SpO2: 96% 97%  96%  Weight:   138 lb (62.6 kg)   Height:       Body mass index is 20.38 kg/m.  Physical Exam  Constitutional: He appears well-developed and well-nourished. No distress.  Alert, mumbled/garbled speech with teeth grinding.   Cardiovascular: Normal rate, regular rhythm, normal heart sounds and intact distal pulses. Exam reveals no gallop and no friction rub.  No murmur heard. Pulmonary/Chest: Effort normal and breath sounds normal. No stridor. No respiratory distress. He has no wheezes. He has no rales. He exhibits no tenderness.  Abdominal: Soft. Bowel sounds are  normal. He exhibits no distension.  Neurological: He is alert.  Skin: Skin is warm and dry.    Lab Results Lab Results  Component Value Date   WBC 18.0 (H) 03/18/2018   HGB 8.8 (L) 03/18/2018   HCT 29.1 (L) 03/18/2018   MCV 86.1 03/18/2018   PLT 194 03/18/2018    Lab Results  Component Value Date   CREATININE 1.09 03/18/2018   BUN 18 03/18/2018   NA 151 (H) 03/18/2018   K 3.2 (L) 03/18/2018   CL 117 (H) 03/18/2018   CO2 28 03/18/2018    Lab Results  Component Value Date   ALT 24 03/15/2018   AST 39 03/15/2018   ALKPHOS 102 03/15/2018   BILITOT 0.9 03/15/2018     Microbiology: Recent Results (from the past 240 hour(s))  Culture, blood  (Routine x 2)     Status: None   Collection Time: 03/10/18  7:46 PM  Result Value Ref Range Status   Specimen Description BLOOD RIGHT ANTECUBITAL  Final   Special Requests   Final    BOTTLES DRAWN AEROBIC AND ANAEROBIC Blood Culture adequate volume   Culture   Final    NO GROWTH 5 DAYS Performed at [facility], [address] [place], [place] [address]    Report Status 03/15/2018 FINAL  Final  Culture, blood (Routine x 2)     Status: None   Collection Time: 03/10/18  8:35 PM  Result Value Ref Range Status   Specimen Description BLOOD RIGHT HAND  Final   Special Requests   Final    BOTTLES DRAWN AEROBIC ONLY Blood Culture results may not be optimal due to an inadequate volume of blood received in culture bottles   Culture   Final    NO GROWTH 5 DAYS Performed at [facility], [address] [place], [place] [address]    Report Status 03/15/2018 FINAL  Final  Urine culture     Status: None   Collection Time: 03/10/18  9:10 PM  Result Value Ref Range Status   Specimen Description URINE, RANDOM  Final   Special Requests NONE  Final   Culture   Final    NO GROWTH Performed at [facility], [address] [place], [place] [address]    Report Status 03/12/2018 FINAL  Final  MRSA PCR Screening     Status: None   Collection Time: 03/11/18  5:15 AM  Result Value Ref Range Status   MRSA by PCR NEGATIVE NEGATIVE Final    Comment:        The GeneXpert MRSA Assay (FDA approved for NASAL specimens only), is one component of a comprehensive MRSA colonization surveillance program. It is not intended to diagnose MRSA infection nor to guide or monitor treatment for MRSA infections. Performed at [facility], [address] [place], [place] [address]   Culture, blood (routine x 2)     Status: None (Preliminary result)   Collection Time: 03/14/18 11:27 AM  Result Value Ref Range Status   Specimen Description BLOOD RIGHT HAND  Final   Special Requests    Final    BOTTLES DRAWN AEROBIC ONLY Blood Culture results may not be optimal due to an inadequate volume of blood received in culture bottles   Culture   Final    NO GROWTH 3 DAYS Performed at [facility], [address], [place], [place] [address]    Report Status PENDING  Incomplete  Culture, blood (routine x 2)     Status: None (Preliminary result)   Collection Time: 03/14/18 11:31 AM  Result Value Ref Range Status   Specimen Description BLOOD RIGHT WRIST  Final   Special Requests   Final    BOTTLES DRAWN AEROBIC ONLY Blood Culture adequate volume   Culture   Final    NO GROWTH 3 DAYS Performed at [facility], [address] [place], [place] [address]    Report Status PENDING  Incomplete     Marcos EkeGreg Calone, NP [facility] [phone] Pager  03/18/2018  10:35 AM

## 2018-03-18 NOTE — Progress Notes (Signed)
[facility] ROUNDING NOTE   Subjective:   Interval History: ID consulted yesterday for persistent fevers.  Started unasyn.  CXR this AM inc R opacity. Patient awake this morning but nonverbal.   Cont on D5W + KCl. UOP down.   Objective:  Vital signs in last 24 hours:  Temp:  [99 F (37.2 C)-101.9 F (38.8 C)] 99.9 F (37.7 C) (08/01 0803) Pulse Rate:  [94-118] 94 (08/01 0803) Resp:  [23-28] 25 (08/01 0803) BP: (107-128)/(69-86) 119/81 (08/01 0803) SpO2:  [90 %-97 %] 96 % (08/01 0803) Weight:  [62.6 kg (138 lb)] 62.6 kg (138 lb) (08/01 0539)  Weight change: -3.175 kg (-7 lb) Filed Weights   03/16/18 0427 03/17/18 0402 03/18/18 0539  Weight: 66.2 kg (146 lb) 65.8 kg (145 lb) 62.6 kg (138 lb)   Intake/Output: I/O last 3 completed shifts: In: 4648.7 [P.O.:100; I.V.:4245.3; IV Piggyback:303.3] Out: 5125 [Urine:5125]   Intake/Output this shift:  Total I/O In: 240 [P.O.:240] Out: 400 [Urine:400]  Physical Exam: General-Alert, awake but nonverbal  CVS- RRR, no mrg's RS- CTA ABD- BS present soft non-distended EXT- no edema Neuro - looks at me but nothing else   Basic Metabolic Panel: Recent Labs  Lab 03/14/18 0723  03/17/18 1630 03/17/18 2053 03/18/18 0059 03/18/18 0432 03/18/18 0925  NA 154*   < > 157* 153* 153* 153* 151*  K 3.1*   < > 3.3* 3.3* 3.0* 3.0* 3.2*  CL 124*   < > 121* 119* 118* 118* 117*  CO2 22   < > 26 24 26 27 28   GLUCOSE 104*   < > 116* 147* 165* 174* 110*  BUN 33*   < > 25* 25* 23* 21* 18  CREATININE 1.73*   < > 1.27* 1.25* 1.25* 1.24 1.09  CALCIUM 7.5*   < > 7.1* 7.0* 7.0* 6.9* 6.9*  MG 2.4  --   --   --   --   --   --    < > = values in this interval not displayed.    Liver Function Tests: Recent Labs  Lab 03/15/18 0504  AST 39  ALT 24  ALKPHOS 102  BILITOT 0.9  PROT 7.3  ALBUMIN 1.6*   No results for input(s): LIPASE, AMYLASE in the last 168 hours. No results for input(s): AMMONIA in the last 168  hours.  CBC: Recent Labs  Lab 03/14/18 0723 03/15/18 0504 03/16/18 0511 03/17/18 0521 03/18/18 0925  WBC 12.9* 13.2* 13.7* 15.9* 18.0*  NEUTROABS  --   --   --   --  11.6*  HGB 8.9* 9.0* 9.7* 9.6* 8.8*  HCT 29.3* 30.1* 32.0* 31.5* 29.1*  MCV 85.9 86.0 85.3 86.8 86.1  PLT 162 165 203 192 194    Cardiac Enzymes: No results for input(s): CKTOTAL, CKMB, CKMBINDEX, TROPONINI in the last 168 hours.  BNP: Invalid input(s): POCBNP  CBG: Recent Labs  Lab 03/17/18 0758 03/17/18 1220 03/17/18 1704 03/17/18 2042 03/18/18 0801  GLUCAP 128* 144* 121* 146* 149*    Microbiology: Results for orders placed or performed during the hospital encounter of 03/10/18  Culture, blood (Routine x 2)     Status: None   Collection Time: 03/10/18  7:46 PM  Result Value Ref Range Status   Specimen Description BLOOD RIGHT ANTECUBITAL  Final   Special Requests   Final    BOTTLES DRAWN AEROBIC AND ANAEROBIC Blood Culture adequate volume   Culture   Final    NO GROWTH 5 DAYS Performed  at [facility], [address] [place], [place] [address]    Report Status 03/15/2018 FINAL  Final  Culture, blood (Routine x 2)     Status: None   Collection Time: 03/10/18  8:35 PM  Result Value Ref Range Status   Specimen Description BLOOD RIGHT HAND  Final   Special Requests   Final    BOTTLES DRAWN AEROBIC ONLY Blood Culture results may not be optimal due to an inadequate volume of blood received in culture bottles   Culture   Final    NO GROWTH 5 DAYS Performed at [facility], [address], [place], [place] [address]    Report Status 03/15/2018 FINAL  Final  Urine culture     Status: None   Collection Time: 03/10/18  9:10 PM  Result Value Ref Range Status   Specimen Description URINE, RANDOM  Final   Special Requests NONE  Final   Culture   Final    NO GROWTH Performed at [facility], [address] [place], [place] [address]    Report Status 03/12/2018 FINAL  Final  MRSA  PCR Screening     Status: None   Collection Time: 03/11/18  5:15 AM  Result Value Ref Range Status   MRSA by PCR NEGATIVE NEGATIVE Final    Comment:        The GeneXpert MRSA Assay (FDA approved for NASAL specimens only), is one component of a comprehensive MRSA colonization surveillance program. It is not intended to diagnose MRSA infection nor to guide or monitor treatment for MRSA infections. Performed at [facility], [address], [place], [place] [address]   Culture, blood (routine x 2)     Status: None (Preliminary result)   Collection Time: 03/14/18 11:27 AM  Result Value Ref Range Status   Specimen Description BLOOD RIGHT HAND  Final   Special Requests   Final    BOTTLES DRAWN AEROBIC ONLY Blood Culture results may not be optimal due to an inadequate volume of blood received in culture bottles   Culture   Final    NO GROWTH 3 DAYS Performed at [facility], [address], [place], [place] [address]    Report Status PENDING  Incomplete  Culture, blood (routine x 2)     Status: None (Preliminary result)   Collection Time: 03/14/18 11:31 AM  Result Value Ref Range Status   Specimen Description BLOOD RIGHT WRIST  Final   Special Requests   Final    BOTTLES DRAWN AEROBIC ONLY Blood Culture adequate volume   Culture   Final    NO GROWTH 3 DAYS Performed at [facility], [address], [place], [place] [address]    Report Status PENDING  Incomplete   Coagulation Studies: No results for input(s): LABPROT, INR in the last 72 hours.  Urinalysis: No results for input(s): COLORURINE, LABSPEC, PHURINE, GLUCOSEU, HGBUR, BILIRUBINUR, KETONESUR, PROTEINUR, UROBILINOGEN, NITRITE, LEUKOCYTESUR in the last 72 hours.  Invalid input(s): APPERANCEUR    Imaging: Dg Chest 2 View  Result Date: 03/18/2018 CLINICAL DATA:  Pneumonia. EXAM: CHEST - 2 VIEW COMPARISON:  Radiograph of March 15, 2018. FINDINGS: Stable cardiomediastinal silhouette. Right-sided  ventriculoperitoneal shunt is again noted. No pneumothorax is noted. Hypoinflation of the lungs is noted. Slightly increased right upper lobe airspace opacity is noted concerning for pneumonia. Minimal bibasilar subsegmental atelectasis noted. Bony thorax is unremarkable. IMPRESSION: Slightly increased right upper lobe airspace opacity is noted  consistent with pneumonia. Minimal bibasilar subsegmental atelectasis. Electronically Signed   By: Lupita Raider, M.D.   On: 03/18/2018 09:16   Dg Swallowing Func-speech Pathology  Result Date: 03/17/2018 Objective Swallowing Evaluation: Type of Study: MBS-Modified Barium Swallow Study  Patient Details Name: Won Kreuzer MRN: [id number] Date of Birth: 20-Aug-1965 Today's Date: 03/17/2018 Time: SLP Start Time (ACUTE ONLY): 0932 -SLP Stop Time (ACUTE ONLY): 0952 SLP Time Calculation (min) (ACUTE ONLY): 20 min Past Medical History: Past Medical History: Diagnosis Date . Depression  . Diabetes mellitus without complication (HCC)  . GAD (generalized anxiety disorder)  . Hypertension  . Hypothyroidism  . Spastic hemiplegia (HCC)  . Thyroid disease   hypothyroidism Past Surgical History: Past Surgical History: Procedure Laterality Date . CYSTOSCOPY W/ URETERAL STENT PLACEMENT Right 03/11/2018  Procedure: CYSTOSCOPY WITH RETROGRADE PYELOGRAM/DOUBLE J STENT PLACEMENT;  Surgeon: [name], MD;  Location: [facility] OR;  Service: Urology;  Laterality: Right; . VENTRICULOPERITONEAL SHUNT   HPI: Helmut Hennon is a 52 y.o. 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  Chest x-ray showed bilateral basilar subsegment atelectasis. Found to have multiple nonobstructing right renal calculi measuring up to 10 mm and underwent cystoscopy, right retrograde  ureteropyelogram, placement of stent. MBS at [facility] end of 2018 he did not aspirate or penetrate, dealyed swallow (thin liq's and appears regular texture recommended). Followed by ST, increased coughing with RN, ST downgraded to puree, continue thin. CXR 7/29 showed Dense focal areas of airspace filling throughout the lungs bilateral, likely infectious. SLP recommended MBS.  No data recorded Assessment / Plan / Recommendation CHL IP CLINICAL IMPRESSIONS 03/17/2018 Clinical Impression Pt exhibits decreased oral manipulation, coordination and cohesion with thin and puree. Reduced tongue base retection led to premature spill of thin to pyriform sinuses and likely delayed timing and closure of laryngeal vestibule with penetration of thin via straw and large sip. Difficult to fully view with suboptimal positioning and reclined posture in chair from pain and shoulder obstructing view somewhat. Suspect penetration was flash or ejected with pt's spontaneous cough and vestibule and area below vocal cords was clear. Smaller sips aided in increased control and protection with thin. Recommend pt continue Dys 1 (puree), thin, straws allowed with SMALL sips, full supervision, small sips and crush meds. Continue to follow.   SLP Visit Diagnosis Dysphagia, oropharyngeal phase (R13.12) Attention and concentration deficit following -- Frontal lobe and executive function deficit following -- Impact on safety and function Mild aspiration risk;Moderate aspiration risk   CHL IP TREATMENT RECOMMENDATION 03/17/2018 Treatment Recommendations Therapy as outlined in treatment plan below   Prognosis 03/17/2018 Prognosis for Safe Diet Advancement Good Barriers to Reach Goals Cognitive deficits Barriers/Prognosis Comment -- CHL IP DIET RECOMMENDATION 03/17/2018 SLP Diet Recommendations Thin liquid;Dysphagia 1 (Puree) solids Liquid Administration via Cup;Straw Medication Administration Crushed with puree Compensations Minimize environmental  distractions Postural Changes Remain semi-upright after after feeds/meals (Comment)   CHL IP OTHER RECOMMENDATIONS 03/17/2018 Recommended Consults -- Oral Care Recommendations Oral care BID Other Recommendations --   CHL IP FOLLOW UP RECOMMENDATIONS 03/17/2018 Follow up Recommendations None   CHL IP FREQUENCY AND DURATION 03/17/2018 Speech Therapy Frequency (ACUTE ONLY) min 2x/week Treatment Duration 2 weeks      CHL IP ORAL PHASE 03/17/2018 Oral Phase Impaired Oral - Pudding Teaspoon -- Oral - Pudding Cup -- Oral - Honey Teaspoon -- Oral - Honey  Cup -- Oral - Nectar Teaspoon -- Oral - Nectar Cup -- Oral - Nectar Straw -- Oral - Thin Teaspoon -- Oral - Thin Cup Premature spillage;Decreased bolus cohesion Oral - Thin Straw Decreased bolus cohesion Oral - Puree Delayed oral transit;Lingual/palatal residue Oral - Mech Soft -- Oral - Regular -- Oral - Multi-Consistency -- Oral - Pill -- Oral Phase - Comment --  CHL IP PHARYNGEAL PHASE 03/17/2018 Pharyngeal Phase Impaired Pharyngeal- Pudding Teaspoon -- Pharyngeal -- Pharyngeal- Pudding Cup -- Pharyngeal -- Pharyngeal- Honey Teaspoon -- Pharyngeal -- Pharyngeal- Honey Cup -- Pharyngeal -- Pharyngeal- Nectar Teaspoon -- Pharyngeal -- Pharyngeal- Nectar Cup -- Pharyngeal -- Pharyngeal- Nectar Straw -- Pharyngeal -- Pharyngeal- Thin Teaspoon -- Pharyngeal -- Pharyngeal- Thin Cup Reduced tongue base retraction;Delayed swallow initiation-pyriform sinuses;Delayed swallow initiation-vallecula Pharyngeal -- Pharyngeal- Thin Straw Delayed swallow initiation-vallecula;Delayed swallow initiation-pyriform sinuses;Penetration/Aspiration during swallow Pharyngeal Material enters airway, remains ABOVE vocal cords then ejected out Pharyngeal- Puree Delayed swallow initiation-vallecula Pharyngeal -- Pharyngeal- Mechanical Soft -- Pharyngeal -- Pharyngeal- Regular -- Pharyngeal -- Pharyngeal- Multi-consistency -- Pharyngeal -- Pharyngeal- Pill -- Pharyngeal -- Pharyngeal Comment --  CHL IP  CERVICAL ESOPHAGEAL PHASE 03/17/2018 Cervical Esophageal Phase WFL Pudding Teaspoon -- Pudding Cup -- Honey Teaspoon -- Honey Cup -- Nectar Teaspoon -- Nectar Cup -- Nectar Straw -- Thin Teaspoon -- Thin Cup -- Thin Straw -- Puree -- Mechanical Soft -- Regular -- Multi-consistency -- Pill -- Cervical Esophageal Comment -- [name], Lisa Willis 03/17/2018, 2:22 PM                Medications:   . sodium chloride 10 mL/hr at 03/17/18 2100  . ampicillin-sulbactam (UNASYN) IV Stopped (03/18/18 0139)  . dextrose 5 % with KCl 20 mEq / L 20 mEq (03/18/18 1002)   . baclofen  10 mg Oral TID  . clonazePAM  0.5 mg Oral Q24H  . clonazePAM  1 mg Oral BID  . collagenase   Topical Daily  . divalproex  500 mg Oral TID  . feeding supplement (PRO-STAT SUGAR FREE 64)  30 mL Oral Daily  . insulin aspart  0-9 Units Subcutaneous TID WC  . insulin detemir  10 Units Subcutaneous Daily  . mouth rinse  15 mL Mouth Rinse BID  . metoprolol tartrate  2.5 mg Intravenous Q6H  . metoprolol tartrate  50 mg Oral QHS  . metoprolol tartrate  75 mg Oral Daily  . multivitamin with minerals  1 tablet Oral Daily  . polyethylene glycol  17 g Oral Daily  . potassium chloride  20 mEq Oral BID  . QUEtiapine  125 mg Oral QHS  . QUEtiapine  150 mg Oral Daily  . selenium sulfide  1 application Topical Q T,Th,Sat-1800  . sertraline  100 mg Oral Daily   sodium chloride, acetaminophen, albuterol, dextromethorphan-guaiFENesin, phenylephrine-shark liver oil-mineral oil-petrolatum  Patient Profile: Rodrigo RanBobby Hone is a 52 y.o. male with a PMHX notable for traumatic brain injury s/p ventriculoperitoneal shunt, spasmatic left hemiplegia, HTN, DMII, hypothyroidism, MDD, GAD, who presented to the ED with fever, abdominal pain, hypotension and worsening mental status from baseline from his SNF.   Assessment/ Plan:   1.  AKI:  Obstruction +/- infection related AKI.  Resolving now.  Cr improved to ~1.1 today.   2.  Hypernatremia + polyuria:   Suspected osmotic diuresis following obstruction (albeit unilateral) on admission.  His polyuria seems to be resolving as his AKI does and retained solute is excreted.  Serum sodium down to 151 this AM.  --D5W +  20KCl at 200/hr this morning but I expect we'll need to begin to reduce the dose this afternoon  --Cont to monitor UOP but I don't think a further w/u for DI is warranted at this time --Currently his mental status isn't clear enough for him to drink to thirst.  When we held a straw cup to his lips he didn't drink.  If this persists he'll need some way to take in free water, possibly G tube but we are not at that point.  I hope with treatment of his infection his mental status will go back to baseline (on admission his serum Na was normal so he must be able to drink to thrist usually) 3.  Hypokalemia:   --IVF with K --KCl 20 po BID today -- Cont to replete PRN  Will continue to follow. Call with questions/concerns.  [name] MD

## 2018-03-18 NOTE — Progress Notes (Signed)
Patient ID: Cody [name], male   DOB: 07/15/1966, 52 y.o.   MRN: [id number] Patient is awake and alert neurologically stable fever curve seem to be trending down with the reinitiation of Unasyn.  Continue observation consider shunt evaluation after 1 to 2 weeks of IV antibiotics.

## 2018-03-18 NOTE — Progress Notes (Signed)
Pharmacy Antibiotic Note  Rodrigo RanBobby Meng is a 52 y.o. male admitted on 03/10/2018 with pneumonia.  Pharmacy has been consulted to transition from Unasyn to Zosyn dosing. SCr 1.09 stable.  Plan: Start Zosyn 3.375g IV q8h (4h infusion) at time next dose Unasyn due Monitor clinical progress, c/s, renal function F/u de-escalation plan/LOT  Height: 5\' 9"  (175.3 cm) Weight: 138 lb (62.6 kg) IBW/kg (Calculated) : 70.7  Temp (24hrs), Avg:100.3 F (37.9 C), Min:99 F (37.2 C), Max:101.9 F (38.8 C)  Recent Labs  Lab 03/13/18 1252 03/14/18 0723 03/15/18 0504 03/16/18 0511  03/17/18 0521  03/17/18 1630 03/17/18 2053 03/18/18 0059 03/18/18 0432 03/18/18 0925  WBC  --  12.9* 13.2* 13.7*  --  15.9*  --   --   --   --   --  18.0*  CREATININE  --  1.73* 1.47* 1.63*   < > 1.57*   < > 1.27* 1.25* 1.25* 1.24 1.09  VANCOTROUGH 25*  --   --   --   --   --   --   --   --   --   --   --    < > = values in this interval not displayed.    Estimated Creatinine Clearance: 71 mL/min (by C-G formula based on SCr of 1.09 mg/dL).    Not on File  Babs BertinHaley Jimma Ortman, PharmD, BCPS Clinical Pharmacist Clinical phone [phone] Please check AMION for all [facility] contact numbers 03/18/2018 1:45 PM

## 2018-03-19 LAB — BASIC METABOLIC PANEL
ANION GAP: 7 (ref 5–15)
Anion gap: 8 (ref 5–15)
BUN: 18 mg/dL (ref 6–20)
BUN: 17 mg/dL (ref 6–20)
CHLORIDE: 114 mmol/L — AB (ref 98–111)
CHLORIDE: 113 mmol/L — AB (ref 98–111)
CO2: 27 mmol/L (ref 22–32)
CO2: 28 mmol/L (ref 22–32)
CREATININE: 1.12 mg/dL (ref 0.61–1.24)
Calcium: 6.9 mg/dL — ABNORMAL LOW (ref 8.9–10.3)
Calcium: 6.9 mg/dL — ABNORMAL LOW (ref 8.9–10.3)
Creatinine, Ser: 1.07 mg/dL (ref 0.61–1.24)
GFR calc non Af Amer: >60 mL/min (ref >60)
GFR calc non Af Amer: >60 mL/min (ref >60)
GLUCOSE: 126 mg/dL — AB (ref 70–99)
GLUCOSE: 141 mg/dL — AB (ref 70–99)
Potassium: 3.4 mmol/L — ABNORMAL LOW (ref 3.5–5.1)
Potassium: 3.3 mmol/L — ABNORMAL LOW (ref 3.5–5.1)
Sodium: 149 mmol/L — ABNORMAL HIGH (ref 135–145)
Sodium: 148 mmol/L — ABNORMAL HIGH (ref 135–145)

## 2018-03-19 LAB — CULTURE, BLOOD (ROUTINE X 2)
CULTURE: NO GROWTH
Culture: NO GROWTH
Special Requests: ADEQUATE

## 2018-03-19 LAB — GLUCOSE, CAPILLARY
GLUCOSE-CAPILLARY: 96 mg/dL (ref 70–99)
Glucose-Capillary: 127 mg/dL — ABNORMAL HIGH (ref 70–99)
Glucose-Capillary: 130 mg/dL — ABNORMAL HIGH (ref 70–99)
Glucose-Capillary: 103 mg/dL — ABNORMAL HIGH (ref 70–99)

## 2018-03-19 LAB — MAGNESIUM: Magnesium: 1.3 mg/dL — ABNORMAL LOW (ref 1.7–2.4)

## 2018-03-19 MED ORDER — POTASSIUM CHLORIDE CRYS ER 20 MEQ PO TBCR
20.0000 meq | EXTENDED_RELEASE_TABLET | Freq: Two times a day (BID) | ORAL | Status: AC
  Administered 2018-03-19 (×2): 20 meq via ORAL
  Filled 2018-03-19 (×2): qty 1

## 2018-03-19 MED ORDER — AMOXICILLIN-POT CLAVULANATE 875-125 MG PO TABS
1.0000 | ORAL_TABLET | Freq: Two times a day (BID) | ORAL | Status: AC
  Administered 2018-03-19 – 2018-03-23 (×10): 1 via ORAL
  Filled 2018-03-19 (×10): qty 1

## 2018-03-19 MED ORDER — MAGNESIUM SULFATE 2 GM/50ML IV SOLN
2.0000 g | Freq: Once | INTRAVENOUS | Status: DC
  Filled 2018-03-19: qty 50

## 2018-03-19 NOTE — Progress Notes (Signed)
[name] ROUNDING NOTE   Subjective:   Interval History: Neurosurgery saw patient yesterday re: VP shunt culture -- not recommended. ID consult cont abx for opacity on CXR, change to augmentin from zosyn.  Urology reviewed repeat imaging - nothing to do for now, stent OK.    When I saw patient he was difficult to arouse but per RN this morning ate good breakfast, drank carton of milk, was talking (a bit garbled).  Rec'd scheduled benzo and has been sleepy since.   Objective:  Vital signs in last 24 hours:  Temp:  [98.7 F (37.1 C)-100.1 F (37.8 C)] 100 F (37.8 C) (08/02 1219) Pulse Rate:  [88-105] 88 (08/02 1219) Resp:  [22-32] 28 (08/02 1219) BP: (100-123)/(70-85) 118/72 (08/02 1219) SpO2:  [96 %-100 %] 100 % (08/02 1219) Weight:  [67.6 kg (149 lb)] 67.6 kg (149 lb) (08/02 0400)  Weight change: 4.99 kg (11 lb) Filed Weights   03/17/18 0402 03/18/18 0539 03/19/18 0400  Weight: 65.8 kg (145 lb) 62.6 kg (138 lb) 67.6 kg (149 lb)   Intake/Output: I/O last 3 completed shifts: In: 5353.9 [P.O.:1200; I.V.:3845.5; IV Piggyback:308.4] Out: 4025 [Urine:4025]   Intake/Output this shift:  Total I/O In: 180 [P.O.:180] Out: 750 [Urine:750]  Physical Exam: General-arousable but not talking or answering questions CVS- RRR, no mrg's RS- CTA ABD- BS present soft non-distended EXT- no edema Neuro - per above   Basic Metabolic Panel: Recent Labs  Lab 03/14/18 0723  03/18/18 1242 03/18/18 1525 03/18/18 2023 03/19/18 0027 03/19/18 0423  NA 154*   < > 152* 150* 148* 149* 148*  K 3.1*   < > 6.0* 4.3 3.6 3.4* 3.3*  CL 124*   < > 118* 116* 114* 114* 113*  CO2 22   < > 24 27 25 27 28   GLUCOSE 104*   < > 140* 142* 136* 126* 141*  BUN 33*   < > 19 16 16 18 17   CREATININE 1.73*   < > 1.15 1.04 1.10 1.12 1.07  CALCIUM 7.5*   < > 7.0* 6.7* 6.9* 6.9* 6.9*  MG 2.4  --   --   --   --   --  1.3*   < > = values in this interval not displayed.    Liver Function  Tests: Recent Labs  Lab 03/15/18 0504  AST 39  ALT 24  ALKPHOS 102  BILITOT 0.9  PROT 7.3  ALBUMIN 1.6*   No results for input(s): LIPASE, AMYLASE in the last 168 hours. No results for input(s): AMMONIA in the last 168 hours.  CBC: Recent Labs  Lab 03/14/18 0723 03/15/18 0504 03/16/18 0511 03/17/18 0521 03/18/18 0925  WBC 12.9* 13.2* 13.7* 15.9* 18.0*  NEUTROABS  --   --   --   --  11.6*  HGB 8.9* 9.0* 9.7* 9.6* 8.8*  HCT 29.3* 30.1* 32.0* 31.5* 29.1*  MCV 85.9 86.0 85.3 86.8 86.1  PLT 162 165 203 192 194    Cardiac Enzymes: No results for input(s): CKTOTAL, CKMB, CKMBINDEX, TROPONINI in the last 168 hours.  BNP: Invalid input(s): POCBNP  CBG: Recent Labs  Lab 03/18/18 1125 03/18/18 1608 03/18/18 2125 03/19/18 0816 03/19/18 1216  GLUCAP 119* 114* 124* 127* 130*    Microbiology: Results for orders placed or performed during the hospital encounter of 03/10/18  Culture, blood (Routine x 2)     Status: None   Collection Time: 03/10/18  7:46 PM  Result Value Ref Range Status  Specimen Description BLOOD RIGHT ANTECUBITAL  Final   Special Requests   Final    BOTTLES DRAWN AEROBIC AND ANAEROBIC Blood Culture adequate volume   Culture   Final    NO GROWTH 5 DAYS Performed at [facility], [address], [place], [place] [address]    Report Status 03/15/2018 FINAL  Final  Culture, blood (Routine x 2)     Status: None   Collection Time: 03/10/18  8:35 PM  Result Value Ref Range Status   Specimen Description BLOOD RIGHT HAND  Final   Special Requests   Final    BOTTLES DRAWN AEROBIC ONLY Blood Culture results may not be optimal due to an inadequate volume of blood received in culture bottles   Culture   Final    NO GROWTH 5 DAYS Performed at [facility], [address] [place], [place] [address]    Report Status 03/15/2018 FINAL  Final  Urine culture     Status: None   Collection Time: 03/10/18  9:10 PM  Result Value Ref Range Status    Specimen Description URINE, RANDOM  Final   Special Requests NONE  Final   Culture   Final    NO GROWTH Performed at [facility], [address], [place], [place] [address]    Report Status 03/12/2018 FINAL  Final  MRSA PCR Screening     Status: None   Collection Time: 03/11/18  5:15 AM  Result Value Ref Range Status   MRSA by PCR NEGATIVE NEGATIVE Final    Comment:        The GeneXpert MRSA Assay (FDA approved for NASAL specimens only), is one component of a comprehensive MRSA colonization surveillance program. It is not intended to diagnose MRSA infection nor to guide or monitor treatment for MRSA infections. Performed at [facility], [address] [place], [place] [address]   Culture, blood (routine x 2)     Status: None   Collection Time: 03/14/18 11:27 AM  Result Value Ref Range Status   Specimen Description BLOOD RIGHT HAND  Final   Special Requests   Final    BOTTLES DRAWN AEROBIC ONLY Blood Culture results may not be optimal due to an inadequate volume of blood received in culture bottles   Culture   Final    NO GROWTH 5 DAYS Performed at [facility], [address], [place], [place] [address]    Report Status 03/19/2018 FINAL  Final  Culture, blood (routine x 2)     Status: None   Collection Time: 03/14/18 11:31 AM  Result Value Ref Range Status   Specimen Description BLOOD RIGHT WRIST  Final   Special Requests   Final    BOTTLES DRAWN AEROBIC ONLY Blood Culture adequate volume   Culture   Final    NO GROWTH 5 DAYS Performed at [facility], [address] [place], [place] [address]    Report Status 03/19/2018 FINAL  Final   Coagulation Studies: No results for input(s): LABPROT, INR in the last 72 hours.  Urinalysis: No results for input(s): COLORURINE, LABSPEC, PHURINE, GLUCOSEU, HGBUR, BILIRUBINUR, KETONESUR, PROTEINUR, UROBILINOGEN, NITRITE, LEUKOCYTESUR in the last 72 hours.  Invalid input(s): APPERANCEUR     Imaging: Dg Chest 2 View  Result Date: 03/18/2018 CLINICAL DATA:  Pneumonia. EXAM: CHEST - 2 VIEW COMPARISON:  Radiograph of March 15, 2018. FINDINGS: Stable cardiomediastinal silhouette. Right-sided ventriculoperitoneal shunt is again noted. No pneumothorax is  noted. Hypoinflation of the lungs is noted. Slightly increased right upper lobe airspace opacity is noted concerning for pneumonia. Minimal bibasilar subsegmental atelectasis noted. Bony thorax is unremarkable. IMPRESSION: Slightly increased right upper lobe airspace opacity is noted consistent with pneumonia. Minimal bibasilar subsegmental atelectasis. Electronically Signed   By: Lupita RaiderJames  Green Jr, M.D.   On: 03/18/2018 09:16     Medications:   . sodium chloride 10 mL/hr at 03/19/18 0005  . dextrose 150 mL/hr at 03/19/18 0004   . amoxicillin-clavulanate  1 tablet Oral Q12H  . baclofen  10 mg Oral TID  . clonazePAM  0.5 mg Oral Q24H  . clonazePAM  1 mg Oral BID  . collagenase   Topical Daily  . divalproex  500 mg Oral TID  . feeding supplement (PRO-STAT SUGAR FREE 64)  30 mL Oral Daily  . insulin aspart  0-9 Units Subcutaneous TID WC  . insulin detemir  10 Units Subcutaneous Daily  . mouth rinse  15 mL Mouth Rinse BID  . metoprolol tartrate  2.5 mg Intravenous Q6H  . metoprolol tartrate  50 mg Oral QHS  . metoprolol tartrate  75 mg Oral Daily  . multivitamin with minerals  1 tablet Oral Daily  . polyethylene glycol  17 g Oral Daily  . potassium chloride SA  20 mEq Oral BID  . QUEtiapine  125 mg Oral QHS  . QUEtiapine  150 mg Oral Daily  . selenium sulfide  1 application Topical Q T,Th,Sat-1800  . sertraline  100 mg Oral Daily   sodium chloride, acetaminophen, albuterol, dextromethorphan-guaiFENesin, phenylephrine-shark liver oil-mineral oil-petrolatum  Patient Profile: Cody [name] is a 52 y.o. male with a PMHX notable for traumatic brain injury s/p ventriculoperitoneal shunt, spasmatic left hemiplegia, HTN, DMII,  hypothyroidism, MDD, GAD, who presented to the ED with fever, abdominal pain, hypotension and worsening mental status from baseline from his SNF.   Assessment/ Plan:   1.  AKI:  Obstruction +/- infection related AKI.  Resolving now.  Cr improved to 1.07 today.   2.  Hypernatremia + polyuria:  Suspected osmotic diuresis following obstruction (albeit unilateral) on admission.  His polyuria seems to be resolving as his AKI does and retained solute is excreted.  Serum sodium down to 148 this AM.  --I believe his osmotic diuresis following AKI has resolved, but he has ongoing physiologic free water losses (Insensible, etc).  --I'm going to stop his IV free water (the D5W) and follow while he's hospitalized.  If he has restored normal renal physiology and his mental status has improved he should be thirsty and drink to normonatremia (which he was on admission) --If he's unable to keep up oral intake to maintain balance we'll need to explore options for on hospital discharge --> this would be most likely G tube placement for FWF --He seems to be quite debilitated at baseline and the RN told me palliative care consult was being considered.  I think that would be helpful especially if he cannot keep up oral intake.   3.  Hypokalemia:   --KCl 20 po BID today -- Cont to replete PRN  4.  Hypomagnesemia: --Ordered 2g IV  Will continue to follow. Call with questions/concerns.  Estill BakesLindsay Karl Knarr MD

## 2018-03-19 NOTE — Progress Notes (Signed)
[facility]  Date of Admission:  03/10/2018     Total days of antibiotics 3         ASSESSMENT/PLAN  Fever - Mr. Quintanar is on Day 3 of antimicrobial therapy and tolerating therapy with no adverse side effects. Clinically remains improved. Will change Zoysn to Augmentin today as he is able to take oral medications with the improved mental status. Will set end date for Augmentin for 8/7 now that he is afebrile for 24 hours.  Hypernatremia - Improved and down-trending. Continue care per primary team.   Dr. Daiva Eves will be available over the weekend as needed.   Principal Problem:   Acute pyelonephritis Active Problems:   Depression   Diabetes mellitus without complication (HCC)   Hypertension   Spastic hemiplegia (HCC)   Sepsis (HCC)   AKI (acute kidney injury) (HCC)   Hydroureteronephrosis-right   Ureterovesical junction (UVJ) obstruction   Acute metabolic encephalopathy   Acute respiratory failure with hypoxia (HCC)   Pressure injury of skin   . baclofen  10 mg Oral TID  . clonazePAM  0.5 mg Oral Q24H  . clonazePAM  1 mg Oral BID  . collagenase   Topical Daily  . divalproex  500 mg Oral TID  . feeding supplement (PRO-STAT SUGAR FREE 64)  30 mL Oral Daily  . insulin aspart  0-9 Units Subcutaneous TID WC  . insulin detemir  10 Units Subcutaneous Daily  . mouth rinse  15 mL Mouth Rinse BID  . metoprolol tartrate  2.5 mg Intravenous Q6H  . metoprolol tartrate  50 mg Oral QHS  . metoprolol tartrate  75 mg Oral Daily  . multivitamin with minerals  1 tablet Oral Daily  . polyethylene glycol  17 g Oral Daily  . potassium chloride SA  20 mEq Oral BID  . QUEtiapine  125 mg Oral QHS  . QUEtiapine  150 mg Oral Daily  . selenium sulfide  1 application Topical Q T,Th,Sat-1800  . sertraline  100 mg Oral Daily    SUBJECTIVE:  Afebrile over the last 24 hours.sodium level continues to decrease now at 148. Changed to piperacillin-tazobactam yesterday  secondary to hypernatremia.   Not on File   Review of Systems: Review of Systems  Unable to perform ROS: Medical condition      OBJECTIVE: Vitals:   03/18/18 1609 03/18/18 2045 03/19/18 0020 03/19/18 0400  BP: 123/85 100/72 115/70 117/73  Pulse: (!) 105 (!) 104 92 91  Resp:  (!) 32  (!) 22  Temp: 98.7 F (37.1 C) 100.1 F (37.8 C) 99.3 F (37.4 C) 99.3 F (37.4 C)  TempSrc: Oral Axillary Oral Axillary  SpO2: 100% 97% 96% 98%  Weight:    149 lb (67.6 kg)  Height:       Body mass index is 22 kg/m.  Physical Exam  Constitutional: He is oriented to person, place, and time. He appears well-developed and well-nourished. No distress.  Cardiovascular: Normal rate, regular rhythm, normal heart sounds and intact distal pulses.  Pulmonary/Chest: Effort normal. No respiratory distress. He has rhonchi.  Neurological: He is alert and oriented to person, place, and time.  Skin: Skin is warm and dry.  Psychiatric: He has a normal mood and affect. His behavior is normal. Judgment and thought content normal.    Lab Results Lab Results  Component Value Date   WBC 18.0 (H) 03/18/2018   HGB 8.8 (L) 03/18/2018   HCT 29.1 (L) 03/18/2018  MCV 86.1 03/18/2018   PLT 194 03/18/2018    Lab Results  Component Value Date   CREATININE 1.07 03/19/2018   BUN 17 03/19/2018   NA 148 (H) 03/19/2018   K 3.3 (L) 03/19/2018   CL 113 (H) 03/19/2018   CO2 28 03/19/2018    Lab Results  Component Value Date   ALT 24 03/15/2018   AST 39 03/15/2018   ALKPHOS 102 03/15/2018   BILITOT 0.9 03/15/2018     Microbiology: Recent Results (from the past 240 hour(s))  Culture, blood (Routine x 2)     Status: None   Collection Time: 03/10/18  7:46 PM  Result Value Ref Range Status   Specimen Description BLOOD RIGHT ANTECUBITAL  Final   Special Requests   Final    BOTTLES DRAWN AEROBIC AND ANAEROBIC Blood Culture adequate volume   Culture   Final    NO GROWTH 5 DAYS Performed at [facility], [address], [place], [place] [address]    Report Status 03/15/2018 FINAL  Final  Culture, blood (Routine x 2)     Status: None   Collection Time: 03/10/18  8:35 PM  Result Value Ref Range Status   Specimen Description BLOOD RIGHT HAND  Final   Special Requests   Final    BOTTLES DRAWN AEROBIC ONLY Blood Culture results may not be optimal due to an inadequate volume of blood received in culture bottles   Culture   Final    NO GROWTH 5 DAYS Performed at [facility], [address] [place], [place] [address]    Report Status 03/15/2018 FINAL  Final  Urine culture     Status: None   Collection Time: 03/10/18  9:10 PM  Result Value Ref Range Status   Specimen Description URINE, RANDOM  Final   Special Requests NONE  Final   Culture   Final    NO GROWTH Performed at [facility], [address] [place], [place] [address]    Report Status 03/12/2018 FINAL  Final  MRSA PCR Screening     Status: None   Collection Time: 03/11/18  5:15 AM  Result Value Ref Range Status   MRSA by PCR NEGATIVE NEGATIVE Final    Comment:        The GeneXpert MRSA Assay (FDA approved for NASAL specimens only), is one component of a comprehensive MRSA colonization surveillance program. It is not intended to diagnose MRSA infection nor to guide or monitor treatment for MRSA infections. Performed at [facility], [address], [place], [place] [address]   Culture, blood (routine x 2)     Status: None (Preliminary result)   Collection Time: 03/14/18 11:27 AM  Result Value Ref Range Status   Specimen Description BLOOD RIGHT HAND  Final   Special Requests   Final    BOTTLES DRAWN AEROBIC ONLY Blood Culture results may not be optimal due to an inadequate volume of blood received in culture bottles   Culture   Final    NO GROWTH 4 DAYS Performed at [facility], [address], [place], [place] [address]    Report Status PENDING  Incomplete  Culture, blood  (routine x 2)     Status: None (Preliminary result)   Collection Time: 03/14/18 11:31 AM  Result Value Ref Range Status   Specimen Description BLOOD RIGHT WRIST  Final   Special Requests   Final    BOTTLES DRAWN AEROBIC  ONLY Blood Culture adequate volume   Culture   Final    NO GROWTH 4 DAYS Performed at [facility], [address] [place], [place] [address]    Report Status PENDING  Incomplete     Marcos EkeGreg Passion Lavin, NP [facility] [phone] Pager  03/19/2018  8:15 AM

## 2018-03-19 NOTE — Progress Notes (Signed)
[facility] PROGRESS NOTE  Rodrigo RanBobby Moquin JYN:[id number] DOB: 11/10/1965 DOA: 03/10/2018 PCP: Shelbie AmmonsHaque, Imran P, MD   Narrative: 52 year old male TBI, spastic left hemiplegia DM TY 2, hypothyroid, bipolar Admitted 03/11/2018 increasing lethargy X 48 hours Hypoxic 70s no facial droop hypotensive systolic to 70s Admitted with sepsis protocol started-found to have AKI creatinine 2.1 BUN 40 T-max 102.6 WBC 15 CT abdomen pelvis right obstructive uropathy 7X5 UVJ stone CT head = extensive encephalomalacia slight interval enlargement of the ventricular system compared to 2009 CT Admitted with presumed sepsis from acute pyelonephritis started on Vanco Zosyn given 4 L of fluid and kept on stepdown unit Urologist was consulted underwent cystoscopy and placement of 6 French X 24 cm JJ stent 7/25 Hospitalization complicated by persisting fever and sepsis  A & Plan Sepsis-multiple components-treated initially with Vanco Zosyn and ID consulted as persisting fevers - CXR signs and symptoms of aspiration-SLP has seen-thin liquids, DYS 1 diet-mentation has improved--now Augmentin as eating Appreciate NS weigh in and Urology weigh in  Severe hypernatremia-admit serum sodium urine output normal and have trended towards marked hyponatremia and polyuria-?  VP shunt versus nephrogenic-unclear-completely agree with watchful waiting per nephrologist-we will be speaking to family about palliation if he trends back up on sodium--do not think G-tube or NG tube would be comfortable long-term and would only prolong the inevitable gradual decline  TBI with resulting left hemiplegia-continue baclofen 10 3 times daily, Klonopin 0.5 q. 24, Depakote 500 3 times daily Needs follow-up in 1 to 2 weeks for shunt evaluation after antibiotics are through-we will cc Dr. Wynetta Emeryram who saw him on 8/1 to arrange follow-up  Chronic aspiration-recommended dysphagia 1 thin liquid he will need to be at 45 degrees at all times-he is at high  risk for recurrent aspiration and worsening of condition  Bipolar-continue Seroquel 125 at bedtime 150 daily and Zoloft 100 at bedtime  Diabetes mellitus type 2-continue Levemir daily and units and sliding scale is controlled 10 3-1 80  Acute metabolic encephalopathy-resolved for now  Acute respiratory failure-chest x-ray as above  Normocytic anemia stable  Hypertension continue metoprolol 75 daily and 50 q PM     DVT prophylaxis: lovenoex  Code Status: F   Family Communication: Discussed in detail with sister on 8/1 explained multiple things and will be discussing again 8/3 disposition Plan: Skilled care for the weekend versus next week depending on mentation and sodium levels   Mahala MenghiniSamtani, MD  [facility] Direct contact: [phone] --Via amion app OR  --[email or web address]; password [email or web address]  7PM-7AM contact night coverage as above 03/19/2018, 4:55 PM  LOS: 8 days   Consultants:  Multiple  Procedures:  None  Antimicrobials:  Currently Unasyn  Interval history/Subjective:  Sleepy today but received some benzodiazepine otherwise fine eating well nursing involved and do not feel he has made any steps backward Speech therapist recommending dysphagia 1  Objective:  Vitals:  Vitals:   03/19/18 1219 03/19/18 1641  BP: 118/72 116/70  Pulse: 88 96  Resp: (!) 28 (!) 23  Temp: 100 F (37.8 C) 100.2 F (37.9 C)  SpO2: 100% 100%    Exam:  Less awake no icterus no pallor S1-S2 no murmur Abdomen soft nontender no rebound Chest is clear No lower extremity edema No rash has a Prevalon boot on the left leg Did not examine sacrum   I have personally reviewed the following:   Labs:  Sodium 148 potassium 3.3 chloride 113 BUN/creatinine 17/1.0   Imaging studies:  CXR 8/1 = slightly increased  RUL opacity  Medical tests:  reviewed   Test discussed with performing physician:  no  Decision to obtain old records:  no  Review and summation of old  records:  no  Scheduled Meds: . amoxicillin-clavulanate  1 tablet Oral Q12H  . baclofen  10 mg Oral TID  . clonazePAM  0.5 mg Oral Q24H  . clonazePAM  1 mg Oral BID  . collagenase   Topical Daily  . divalproex  500 mg Oral TID  . feeding supplement (PRO-STAT SUGAR FREE 64)  30 mL Oral Daily  . insulin aspart  0-9 Units Subcutaneous TID WC  . insulin detemir  10 Units Subcutaneous Daily  . mouth rinse  15 mL Mouth Rinse BID  . metoprolol tartrate  2.5 mg Intravenous Q6H  . metoprolol tartrate  50 mg Oral QHS  . metoprolol tartrate  75 mg Oral Daily  . multivitamin with minerals  1 tablet Oral Daily  . polyethylene glycol  17 g Oral Daily  . potassium chloride SA  20 mEq Oral BID  . QUEtiapine  125 mg Oral QHS  . QUEtiapine  150 mg Oral Daily  . selenium sulfide  1 application Topical Q T,Th,Sat-1800  . sertraline  100 mg Oral Daily   Continuous Infusions: . sodium chloride 10 mL/hr at 03/19/18 0005    Principal Problem:   Acute pyelonephritis Active Problems:   Depression   Diabetes mellitus without complication (HCC)   Hypertension   Spastic hemiplegia (HCC)   Sepsis (HCC)   AKI (acute kidney injury) (HCC)   Hydroureteronephrosis-right   Ureterovesical junction (UVJ) obstruction   Acute metabolic encephalopathy   Acute respiratory failure with hypoxia (HCC)   Pressure injury of skin   LOS: 8 days

## 2018-03-19 NOTE — Progress Notes (Signed)
  Speech Language Pathology Treatment: Dysphagia  Patient Details Name: Cody [name] Davids MRN: [id number] DOB: 02/28/1966 Today's Date: 03/19/2018 Time: [id number] SLP Time Calculation (min) (ACUTE ONLY): 12 min  Assessment / Plan / Recommendation Clinical Impression  Pt had MBS Wed with recommendations for puree diet, thin liquids. Pt sleepy this am and RN suspects excessive Klonopin and will discuss with MD. [name] was awake, not quite as alert as usual and air matress/bed with head of bed only reaching 45 degrees. Therapist raised bed level and placed in reverse Trendelenburg position which was effective. Not able to self feed or assist with hand over hand therefore dependent for SLP administered sips via straw and carefully controlled volume to mitigate risk of penetration (as present during MBS). Cough x 2 (immediate and delayed). Manipulated puree texture with min labial residue. RN arrived and reporting MD is concerned re: pt being able to balance sodium levels with po intake and has  "if he has restored normal renal physiology and his mental status has improved he should be thirsty and drink to normonatremia" and if unable to maintain balance we'll need to explore options most likely G tube placement" and recommending consideration of Palliative care. Continue puree texture, thin, SMALL sips, full assist and place bed in reverse Trendelenburg position so head and trunk are upright.     HPI HPI: Cody [name] Cody [name] is a 52 y.o. 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  Chest x-ray showed bilateral basilar subsegment atelectasis. Found to have multiple nonobstructing right renal calculi measuring up to 10 mm and underwent cystoscopy, right retrograde ureteropyelogram, placement of  stent. MBS at [facility] end of 2018 he did not aspirate or penetrate, dealyed swallow (thin liq's and appears regular texture recommended). Followed by ST, increased coughing with RN, ST downgraded to puree, continue thin. CXR 7/29 showed Dense focal areas of airspace filling throughout the lungs bilateral, likely infectious. SLP recommended MBS.      SLP Plan  Continue with current plan of care       Recommendations  Diet recommendations: Dysphagia 1 (puree);Thin liquid Liquids provided via: Cup;Straw Medication Administration: Crushed with puree Supervision: Staff to assist with self feeding;Full supervision/cueing for compensatory strategies Compensations: Minimize environmental distractions;Slow rate;Small sips/bites Postural Changes and/or Swallow Maneuvers: Seated upright 90 degrees                Oral Care Recommendations: Oral care BID Follow up Recommendations: Other (comment);24 hour supervision/assistance(TBD) SLP Visit Diagnosis: Dysphagia, oropharyngeal phase (R13.12) Plan: Continue with current plan of care                   [name], Iyania Denne Willis 03/19/2018, 3:08 PM  Breck CoonsLisa Willis Lonell FaceLitaker M.Ed [employer]-SLP Pager [phone]

## 2018-03-19 NOTE — Progress Notes (Signed)
8 Days Post-Op Subjective: Patient responding to abx mgmt. Repeat CT--some improvement in R hydro w/ stent in place (prox tip may be w/in renal parenchyma--no need @ this point for repositioning)  Objective: Vital signs in last 24 hours: Temp:  [98.7 F (37.1 C)-100.1 F (37.8 C)] 99.3 F (37.4 C) (08/02 0400) Pulse Rate:  [91-108] 91 (08/02 0400) Resp:  [22-32] 22 (08/02 0400) BP: (100-123)/(70-86) 117/73 (08/02 0400) SpO2:  [96 %-100 %] 98 % (08/02 0400) Weight:  [67.6 kg (149 lb)] 67.6 kg (149 lb) (08/02 0400)  Intake/Output from previous day: 08/01 0701 - 08/02 0700 In: 2819.4 [P.O.:1200; I.V.:1511.1; IV Piggyback:108.4] Out: 2950 [Urine:2950] Intake/Output this shift: No intake/output data recorded.    Lab Results: Recent Labs    03/17/18 0521 03/18/18 0925  HGB 9.6* 8.8*  HCT 31.5* 29.1*   BMET Recent Labs    03/19/18 0027 03/19/18 0423  NA 149* 148*  K 3.4* 3.3*  CL 114* 113*  CO2 27 28  GLUCOSE 126* 141*  BUN 18 17  CREATININE 1.12 1.07  CALCIUM 6.9* 6.9*   No results for input(s): LABPT, INR in the last 72 hours. No results for input(s): LABURIN in the last 72 hours. Results for orders placed or performed during the hospital encounter of 03/10/18  Culture, blood (Routine x 2)     Status: None   Collection Time: 03/10/18  7:46 PM  Result Value Ref Range Status   Specimen Description BLOOD RIGHT ANTECUBITAL  Final   Special Requests   Final    BOTTLES DRAWN AEROBIC AND ANAEROBIC Blood Culture adequate volume   Culture   Final    NO GROWTH 5 DAYS Performed at [facility], [address] [place], [place] [address]    Report Status 03/15/2018 FINAL  Final  Culture, blood (Routine x 2)     Status: None   Collection Time: 03/10/18  8:35 PM  Result Value Ref Range Status   Specimen Description BLOOD RIGHT HAND  Final   Special Requests   Final    BOTTLES DRAWN AEROBIC ONLY Blood Culture results may not be optimal due to an inadequate volume  of blood received in culture bottles   Culture   Final    NO GROWTH 5 DAYS Performed at [facility], [address] [place], [place] [address]    Report Status 03/15/2018 FINAL  Final  Urine culture     Status: None   Collection Time: 03/10/18  9:10 PM  Result Value Ref Range Status   Specimen Description URINE, RANDOM  Final   Special Requests NONE  Final   Culture   Final    NO GROWTH Performed at [facility], [address] [place], [place] [address]    Report Status 03/12/2018 FINAL  Final  MRSA PCR Screening     Status: None   Collection Time: 03/11/18  5:15 AM  Result Value Ref Range Status   MRSA by PCR NEGATIVE NEGATIVE Final    Comment:        The GeneXpert MRSA Assay (FDA approved for NASAL specimens only), is one component of a comprehensive MRSA colonization surveillance program. It is not intended to diagnose MRSA infection nor to guide or monitor treatment for MRSA infections. Performed at [facility], [address] [place], [place] [address]   Culture, blood (routine x 2)     Status: None (Preliminary result)   Collection Time: 03/14/18 11:27 AM  Result Value Ref Range Status   Specimen Description BLOOD RIGHT HAND  Final   Special Requests   Final    BOTTLES DRAWN AEROBIC ONLY Blood Culture results may not be optimal due to an inadequate volume of blood received in culture bottles   Culture   Final    NO GROWTH 4 DAYS Performed at [facility], [address], [place], [place] [address]    Report Status PENDING  Incomplete  Culture, blood (routine x 2)     Status: None (Preliminary result)   Collection Time: 03/14/18 11:31 AM  Result Value Ref Range Status   Specimen Description BLOOD RIGHT WRIST  Final   Special Requests   Final    BOTTLES DRAWN AEROBIC ONLY Blood Culture adequate volume   Culture   Final    NO GROWTH 4 DAYS Performed at [facility], [address], [place], [place] [address]    Report Status  PENDING  Incomplete    Studies/Results: Dg Chest 2 View  Result Date: 03/18/2018 CLINICAL DATA:  Pneumonia. EXAM: CHEST - 2 VIEW COMPARISON:  Radiograph of March 15, 2018. FINDINGS: Stable cardiomediastinal silhouette. Right-sided ventriculoperitoneal shunt is again noted. No pneumothorax is noted. Hypoinflation of the lungs is noted. Slightly increased right upper lobe airspace opacity is noted concerning for pneumonia. Minimal bibasilar subsegmental atelectasis noted. Bony thorax is unremarkable. IMPRESSION: Slightly increased right upper lobe airspace opacity is noted consistent with pneumonia. Minimal bibasilar subsegmental atelectasis. Electronically Signed   By: Lupita Raider, M.D.   On: 03/18/2018 09:16   Dg Swallowing Func-speech Pathology  Result Date: 03/17/2018 Objective Swallowing Evaluation: Type of Study: MBS-Modified Barium Swallow Study  Patient Details Name: Cody [name] MRN: [id number] Date of Birth: Apr 27, 1966 Today's Date: 03/17/2018 Time: SLP Start Time (ACUTE ONLY): 0932 -SLP Stop Time (ACUTE ONLY): 0952 SLP Time Calculation (min) (ACUTE ONLY): 20 min Past Medical History: Past Medical History: Diagnosis Date . Depression  . Diabetes mellitus without complication (HCC)  . GAD (generalized anxiety disorder)  . Hypertension  . Hypothyroidism  . Spastic hemiplegia (HCC)  . Thyroid disease   hypothyroidism Past Surgical History: Past Surgical History: Procedure Laterality Date . CYSTOSCOPY W/ URETERAL STENT PLACEMENT Right 03/11/2018  Procedure: CYSTOSCOPY WITH RETROGRADE PYELOGRAM/DOUBLE J STENT PLACEMENT;  Surgeon: [name], MD;  Location: [facility] OR;  Service: Urology;  Laterality: Right; . VENTRICULOPERITONEAL SHUNT   HPI: Cody [name] is a 52 y.o. 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  Chest x-ray showed bilateral basilar subsegment atelectasis. Found to have multiple nonobstructing right renal calculi measuring up to 10 mm and underwent cystoscopy, right retrograde ureteropyelogram, placement of stent. MBS at [facility] end of 2018 he did not aspirate or penetrate, dealyed swallow (thin liq's and appears regular texture recommended). Followed by ST, increased coughing with RN, ST downgraded to puree, continue thin. CXR 7/29 showed Dense focal areas of airspace filling throughout the lungs bilateral, likely infectious. SLP recommended MBS.  No data recorded Assessment / Plan / Recommendation CHL IP CLINICAL IMPRESSIONS 03/17/2018 Clinical Impression Pt exhibits decreased oral manipulation, coordination and cohesion with thin and puree. Reduced tongue base retection led to premature spill of thin to pyriform sinuses and likely delayed timing and closure of laryngeal vestibule with penetration of thin via straw and large sip. Difficult to fully view with suboptimal  positioning and reclined posture in chair from pain and shoulder obstructing view somewhat. Suspect penetration was flash or ejected with pt's spontaneous cough and vestibule and area below vocal cords was clear. Smaller sips aided in increased control and protection with thin. Recommend pt continue Dys 1 (puree), thin, straws allowed with SMALL sips, full supervision, small sips and crush meds. Continue to follow.   SLP Visit Diagnosis Dysphagia, oropharyngeal phase (R13.12) Attention and concentration deficit following -- Frontal lobe and executive function deficit following -- Impact on safety and function Mild aspiration risk;Moderate aspiration risk   CHL IP TREATMENT RECOMMENDATION 03/17/2018 Treatment Recommendations Therapy as outlined in treatment plan below   Prognosis 03/17/2018 Prognosis for Safe Diet Advancement Good Barriers to Reach Goals Cognitive deficits Barriers/Prognosis Comment --  CHL IP DIET RECOMMENDATION 03/17/2018 SLP Diet Recommendations Thin liquid;Dysphagia 1 (Puree) solids Liquid Administration via Cup;Straw Medication Administration Crushed with puree Compensations Minimize environmental distractions Postural Changes Remain semi-upright after after feeds/meals (Comment)   CHL IP OTHER RECOMMENDATIONS 03/17/2018 Recommended Consults -- Oral Care Recommendations Oral care BID Other Recommendations --   CHL IP FOLLOW UP RECOMMENDATIONS 03/17/2018 Follow up Recommendations None   CHL IP FREQUENCY AND DURATION 03/17/2018 Speech Therapy Frequency (ACUTE ONLY) min 2x/week Treatment Duration 2 weeks      CHL IP ORAL PHASE 03/17/2018 Oral Phase Impaired Oral - Pudding Teaspoon -- Oral - Pudding Cup -- Oral - Honey Teaspoon -- Oral - Honey Cup -- Oral - Nectar Teaspoon -- Oral - Nectar Cup -- Oral - Nectar Straw -- Oral - Thin Teaspoon -- Oral - Thin Cup Premature spillage;Decreased bolus cohesion Oral - Thin Straw Decreased bolus cohesion Oral - Puree Delayed oral transit;Lingual/palatal residue Oral - Mech Soft -- Oral - Regular -- Oral - Multi-Consistency -- Oral - Pill -- Oral Phase - Comment --  CHL IP PHARYNGEAL PHASE 03/17/2018 Pharyngeal Phase Impaired Pharyngeal- Pudding Teaspoon -- Pharyngeal -- Pharyngeal- Pudding Cup -- Pharyngeal -- Pharyngeal- Honey Teaspoon -- Pharyngeal -- Pharyngeal- Honey Cup -- Pharyngeal -- Pharyngeal- Nectar Teaspoon -- Pharyngeal -- Pharyngeal- Nectar Cup -- Pharyngeal -- Pharyngeal- Nectar Straw -- Pharyngeal -- Pharyngeal- Thin Teaspoon -- Pharyngeal -- Pharyngeal- Thin Cup Reduced tongue base retraction;Delayed swallow initiation-pyriform sinuses;Delayed swallow initiation-vallecula Pharyngeal -- Pharyngeal- Thin Straw Delayed swallow initiation-vallecula;Delayed swallow initiation-pyriform sinuses;Penetration/Aspiration during swallow Pharyngeal Material enters airway, remains ABOVE vocal cords then ejected out Pharyngeal- Puree Delayed swallow  initiation-vallecula Pharyngeal -- Pharyngeal- Mechanical Soft -- Pharyngeal -- Pharyngeal- Regular -- Pharyngeal -- Pharyngeal- Multi-consistency -- Pharyngeal -- Pharyngeal- Pill -- Pharyngeal -- Pharyngeal Comment --  CHL IP CERVICAL ESOPHAGEAL PHASE 03/17/2018 Cervical Esophageal Phase WFL Pudding Teaspoon -- Pudding Cup -- Honey Teaspoon -- Honey Cup -- Nectar Teaspoon -- Nectar Cup -- Nectar Straw -- Thin Teaspoon -- Thin Cup -- Thin Straw -- Puree -- Mechanical Soft -- Regular -- Multi-consistency -- Pill -- Cervical Esophageal Comment -- Royce Macadamia 03/17/2018, 2:22 PM               Assessment/Plan:   Rt ureteral stone w/ hydro and initial presumed UTI but cultures negative. He seems to haxe adequate drainage w/ stent. Will allow for current mgmt of presumed pneumonia with eventual out pt ureteroscopic mgmt of stone.  Call for any issues needing GU input   LOS: 8 days   Chelsea Aus 03/19/2018, 7:29 AM

## 2018-03-19 NOTE — Progress Notes (Signed)
CSW following for disposition planning, as patient is a long term SNF resident. Will follow for progress and medical readiness and continue to support.  Abigail ButtsSusan Gillie Fleites, LCSWA [phone]

## 2018-03-20 LAB — COMPREHENSIVE METABOLIC PANEL
ALBUMIN: 1.6 g/dL — AB (ref 3.5–5.0)
ALK PHOS: 96 U/L (ref 38–126)
ALT: 20 U/L (ref 0–44)
ANION GAP: 8 (ref 5–15)
AST: 25 U/L (ref 15–41)
BILIRUBIN TOTAL: 0.3 mg/dL (ref 0.3–1.2)
BUN: 14 mg/dL (ref 6–20)
CALCIUM: 7.2 mg/dL — AB (ref 8.9–10.3)
CO2: 26 mmol/L (ref 22–32)
CREATININE: 0.98 mg/dL (ref 0.61–1.24)
Chloride: 114 mmol/L — ABNORMAL HIGH (ref 98–111)
GFR calc non Af Amer: >60 mL/min (ref >60)
GLUCOSE: 79 mg/dL (ref 70–99)
Potassium: 3.6 mmol/L (ref 3.5–5.1)
Sodium: 148 mmol/L — ABNORMAL HIGH (ref 135–145)
Total Protein: 7.6 g/dL (ref 6.5–8.1)

## 2018-03-20 LAB — GLUCOSE, CAPILLARY
Glucose-Capillary: 96 mg/dL (ref 70–99)
Glucose-Capillary: 99 mg/dL (ref 70–99)
Glucose-Capillary: 89 mg/dL (ref 70–99)
Glucose-Capillary: 105 mg/dL — ABNORMAL HIGH (ref 70–99)

## 2018-03-20 LAB — CBC WITH DIFFERENTIAL/PLATELET
ABS IMMATURE GRANULOCYTES: 0.5 10*3/uL — AB (ref 0.0–0.1)
BASOS ABS: 0 10*3/uL (ref 0.0–0.1)
BASOS PCT: 0 %
EOS ABS: 0.2 10*3/uL (ref 0.0–0.7)
EOS PCT: 1 %
HCT: 26.1 % — ABNORMAL LOW (ref 39.0–52.0)
Hemoglobin: 7.8 g/dL — ABNORMAL LOW (ref 13.0–17.0)
Immature Granulocytes: 2 %
Lymphocytes Relative: 20 %
Lymphs Abs: 3.7 10*3/uL (ref 0.7–4.0)
MCH: 26 pg (ref 26.0–34.0)
MCHC: 29.9 g/dL — AB (ref 30.0–36.0)
MCV: 87 fL (ref 78.0–100.0)
MONO ABS: 1.5 10*3/uL — AB (ref 0.1–1.0)
MONOS PCT: 8 %
NEUTROS ABS: 12.9 10*3/uL — AB (ref 1.7–7.7)
Neutrophils Relative %: 69 %
PLATELETS: 206 10*3/uL (ref 150–400)
RBC: 3 MIL/uL — ABNORMAL LOW (ref 4.22–5.81)
RDW: 17.3 % — AB (ref 11.5–15.5)
WBC: 18.8 10*3/uL — ABNORMAL HIGH (ref 4.0–10.5)

## 2018-03-20 LAB — MAGNESIUM: MAGNESIUM: 2 mg/dL (ref 1.7–2.4)

## 2018-03-20 MED ORDER — HYDROCODONE-ACETAMINOPHEN 5-325 MG PO TABS
1.0000 | ORAL_TABLET | ORAL | Status: DC | PRN
  Administered 2018-03-20 – 2018-03-24 (×3): 1 via ORAL
  Administered 2018-03-24: 2 via ORAL
  Administered 2018-03-27 – 2018-04-01 (×5): 1 via ORAL
  Administered 2018-04-02 – 2018-04-03 (×2): 2 via ORAL
  Administered 2018-04-06 – 2018-04-07 (×2): 1 via ORAL
  Filled 2018-03-20: qty 2
  Filled 2018-03-20 (×4): qty 1
  Filled 2018-03-20: qty 2
  Filled 2018-03-20 (×2): qty 1
  Filled 2018-03-20: qty 2
  Filled 2018-03-20 (×4): qty 1

## 2018-03-20 MED ORDER — FREE WATER
200.0000 mL | Freq: Three times a day (TID) | Status: DC
  Administered 2018-03-20 – 2018-03-21 (×3): 200 mL via ORAL

## 2018-03-20 NOTE — Progress Notes (Signed)
[facility] ROUNDING NOTE   Subjective:   Interval History: No changes.  This morning arousable and acknowledges me.    Objective:  Vital signs in last 24 hours:  Temp:  [98.5 F (36.9 C)-100.2 F (37.9 C)] 98.7 F (37.1 C) (08/03 1133) Pulse Rate:  [89-109] 89 (08/03 1133) Resp:  [20-32] 20 (08/03 1133) BP: (98-128)/(60-87) 122/83 (08/03 1133) SpO2:  [97 %-100 %] 100 % (08/03 1133) Weight:  [61.7 kg (136 lb)] 61.7 kg (136 lb) (08/03 0500)  Weight change: -5.897 kg (-13 lb) Filed Weights   03/18/18 0539 03/19/18 0400 03/20/18 0500  Weight: 62.6 kg (138 lb) 67.6 kg (149 lb) 61.7 kg (136 lb)   Intake/Output: I/O last 3 completed shifts: In: 360 [P.O.:360] Out: 4750 [Urine:4750]   Intake/Output this shift:  Total I/O In: 480 [P.O.:480] Out: 625 [Urine:625]  Physical Exam: General-arousable but not talking or answering questions CVS- RRR, no mrg's Lungs - loose rhonchi anteriorly ABD- BS present soft non-distended EXT- no edema Neuro - per above   Basic Metabolic Panel: Recent Labs  Lab 03/14/18 0723  03/18/18 1525 03/18/18 2023 03/19/18 0027 03/19/18 0423 03/20/18 0744  NA 154*   < > 150* 148* 149* 148* 148*  K 3.1*   < > 4.3 3.6 3.4* 3.3* 3.6  CL 124*   < > 116* 114* 114* 113* 114*  CO2 22   < > 27 25 27 28 26   GLUCOSE 104*   < > 142* 136* 126* 141* 79  BUN 33*   < > 16 16 18 17 14   CREATININE 1.73*   < > 1.04 1.10 1.12 1.07 0.98  CALCIUM 7.5*   < > 6.7* 6.9* 6.9* 6.9* 7.2*  MG 2.4  --   --   --   --  1.3* 2.0   < > = values in this interval not displayed.    Liver Function Tests: Recent Labs  Lab 03/15/18 0504 03/20/18 0744  AST 39 25  ALT 24 20  ALKPHOS 102 96  BILITOT 0.9 0.3  PROT 7.3 7.6  ALBUMIN 1.6* 1.6*   No results for input(s): LIPASE, AMYLASE in the last 168 hours. No results for input(s): AMMONIA in the last 168 hours.  CBC: Recent Labs  Lab 03/15/18 0504 03/16/18 0511 03/17/18 0521 03/18/18 0925  03/20/18 0744  WBC 13.2* 13.7* 15.9* 18.0* 18.8*  NEUTROABS  --   --   --  11.6* 12.9*  HGB 9.0* 9.7* 9.6* 8.8* 7.8*  HCT 30.1* 32.0* 31.5* 29.1* 26.1*  MCV 86.0 85.3 86.8 86.1 87.0  PLT 165 203 192 194 206    Cardiac Enzymes: No results for input(s): CKTOTAL, CKMB, CKMBINDEX, TROPONINI in the last 168 hours.  BNP: Invalid input(s): POCBNP  CBG: Recent Labs  Lab 03/19/18 1216 03/19/18 1639 03/19/18 2135 03/20/18 0815 03/20/18 1134  GLUCAP 130* 103* 96 96 99    Microbiology: Results for orders placed or performed during the hospital encounter of 03/10/18  Culture, blood (Routine x 2)     Status: None   Collection Time: 03/10/18  7:46 PM  Result Value Ref Range Status   Specimen Description BLOOD RIGHT ANTECUBITAL  Final   Special Requests   Final    BOTTLES DRAWN AEROBIC AND ANAEROBIC Blood Culture adequate volume   Culture   Final    NO GROWTH 5 DAYS Performed at [facility], [address] [place], [place] [address]    Report Status 03/15/2018 FINAL  Final  Culture, blood (Routine x 2)     Status: None   Collection Time: 03/10/18  8:35 PM  Result Value Ref Range Status   Specimen Description BLOOD RIGHT HAND  Final   Special Requests   Final    BOTTLES DRAWN AEROBIC ONLY Blood Culture results may not be optimal due to an inadequate volume of blood received in culture bottles   Culture   Final    NO GROWTH 5 DAYS Performed at [facility], [address], [place], [place] [address]    Report Status 03/15/2018 FINAL  Final  Urine culture     Status: None   Collection Time: 03/10/18  9:10 PM  Result Value Ref Range Status   Specimen Description URINE, RANDOM  Final   Special Requests NONE  Final   Culture   Final    NO GROWTH Performed at [facility], [address] [place], [place] [address]    Report Status 03/12/2018 FINAL  Final  MRSA PCR Screening     Status: None   Collection Time: 03/11/18  5:15 AM  Result Value Ref Range  Status   MRSA by PCR NEGATIVE NEGATIVE Final    Comment:        The GeneXpert MRSA Assay (FDA approved for NASAL specimens only), is one component of a comprehensive MRSA colonization surveillance program. It is not intended to diagnose MRSA infection nor to guide or monitor treatment for MRSA infections. Performed at [facility], [address] [place], [place] [address]   Culture, blood (routine x 2)     Status: None   Collection Time: 03/14/18 11:27 AM  Result Value Ref Range Status   Specimen Description BLOOD RIGHT HAND  Final   Special Requests   Final    BOTTLES DRAWN AEROBIC ONLY Blood Culture results may not be optimal due to an inadequate volume of blood received in culture bottles   Culture   Final    NO GROWTH 5 DAYS Performed at [facility], [address] [place], [place] [address]    Report Status 03/19/2018 FINAL  Final  Culture, blood (routine x 2)     Status: None   Collection Time: 03/14/18 11:31 AM  Result Value Ref Range Status   Specimen Description BLOOD RIGHT WRIST  Final   Special Requests   Final    BOTTLES DRAWN AEROBIC ONLY Blood Culture adequate volume   Culture   Final    NO GROWTH 5 DAYS Performed at [facility], [address], [place], [place] [address]    Report Status 03/19/2018 FINAL  Final   Coagulation Studies: No results for input(s): LABPROT, INR in the last 72 hours.  Urinalysis: No results for input(s): COLORURINE, LABSPEC, PHURINE, GLUCOSEU, HGBUR, BILIRUBINUR, KETONESUR, PROTEINUR, UROBILINOGEN, NITRITE, LEUKOCYTESUR in the last 72 hours.  Invalid input(s): APPERANCEUR    Imaging: No results found.   Medications:   . sodium chloride 10 mL/hr at 03/20/18 1439  . magnesium sulfate 1 - 4 g bolus IVPB Stopped (03/19/18 1513)   . amoxicillin-clavulanate  1 tablet Oral Q12H  . baclofen  10 mg Oral TID  . clonazePAM  0.5 mg Oral Q24H  . clonazePAM  1 mg Oral BID  . collagenase   Topical Daily  .  divalproex  500 mg Oral TID  . feeding supplement (PRO-STAT SUGAR FREE 64)  30 mL Oral Daily  . insulin aspart  0-9 Units Subcutaneous TID  WC  . insulin detemir  10 Units Subcutaneous Daily  . mouth rinse  15 mL Mouth Rinse BID  . metoprolol tartrate  2.5 mg Intravenous Q6H  . metoprolol tartrate  50 mg Oral QHS  . metoprolol tartrate  75 mg Oral Daily  . multivitamin with minerals  1 tablet Oral Daily  . polyethylene glycol  17 g Oral Daily  . QUEtiapine  125 mg Oral QHS  . QUEtiapine  150 mg Oral Daily  . selenium sulfide  1 application Topical Q T,Th,Sat-1800  . sertraline  100 mg Oral Daily   sodium chloride, acetaminophen, albuterol, dextromethorphan-guaiFENesin, phenylephrine-shark liver oil-mineral oil-petrolatum  Patient Profile: Pantelis Elgersma is a 52 y.o. male with a PMHX notable for traumatic brain injury s/p ventriculoperitoneal shunt, spasmatic left hemiplegia, HTN, DMII, hypothyroidism, MDD, GAD, who presented to the ED with fever, abdominal pain, hypotension and worsening mental status from baseline from his SNF.   Assessment/ Plan:    **Hypernatremia, polyuria:   Suspected osmotic diuresis following obstruction (albeit unilateral) on admission.  His polyuria seems to be resolving as his AKI does and retained solute is excreted though UOP did remain 3.3L yesterday (so far only 625 today at 3:30pm).  We discontinued his free water supplement yesterday afternoon to see if he could maintain normonatremia with po intake alone.  Serum sodium this morning was stable at 148 so we will continue to watch this. Palliative care consult to follow.    **Hypokalemia:  Resolved with supplement  **AKI:  Obstruction +/- infection related AKI.  Resolved.   Will continue to follow. Call with questions/concerns.  [name] MD

## 2018-03-20 NOTE — Progress Notes (Signed)
[facility] PROGRESS NOTE  Cody [name] EAV:[id number] DOB: [birth year] DOA: 03/10/2018 PCP: [name], MD   Narrative: 52 year old male TBI, spastic left hemiplegia DM TY 2, hypothyroid, bipolar Admitted 03/11/2018 increasing lethargy X 48 hours Hypoxic 70s no facial droop hypotensive systolic to 70s Admitted with sepsis protocol started-found to have AKI creatinine 2.1 BUN 40 T-max 102.6 WBC 15 CT abdomen pelvis right obstructive uropathy 7X5 UVJ stone CT head = extensive encephalomalacia slight interval enlargement of the ventricular system compared to 2009 CT Admitted with presumed sepsis from acute pyelonephritis started on Vanco Zosyn given 4 L of fluid and kept on stepdown unit Urologist was consulted underwent cystoscopy and placement of 6 French X 24 cm JJ stent 7/25 Hospitalization complicated by persisting fever and sepsis  A & Plan Sepsis-multiple components-treated initially with Vanco Zosyn and ID consulted as persisting fevers - CXR signs and symptoms of aspiration-SLP has seen-thin liquids, DYS 1 diet-mentation has improved--now Augmentin as eating Appreciate NS weigh in and Urology weigh in  Severe hypernatremia-admit serum sodium urine output normal and have trended towards marked hyponatremia and polyuria-?  VP shunt versus nephrogenic-unclear-completely agree with watchful waiting per nephrologist-labs seem stable Will force oral fluids ~ 2 liters per day and see how he does over next 48hr Will talk to sister with am labs and decide on if we will ask for palliative input  TBI with resulting left hemiplegia-continue baclofen 10 3 times daily, Klonopin 0.5 q. 24, Depakote 500 3 times daily Needs follow-up in 1 to 2 weeks for shunt evaluation after antibiotics are through-we will cc Dr. Wynetta Emery who saw him on 8/1 to arrange follow-up  Chronic aspiration-recommended dysphagia 1 thin liquid he will need to be at 45 degrees at all times-he is at high risk for recurrent  aspiration and worsening of condition  Bipolar-continue Seroquel 125 at bedtime 150 daily and Zoloft 100 at bedtime  Diabetes mellitus type 2-continue Levemir daily and units and sliding scale is controlled 10 3-1 80  Acute metabolic encephalopathy-resolved for now  Acute respiratory failure-chest x-ray as above  Normocytic anemia stable  Hypertension continue metoprolol 75 daily and 50 q PM     DVT prophylaxis: lovenoex  Code Status: F   Family Communication: Discussed in detail 8/3 disposition Plan: Skilled care nexst week hope so +/- palliative care   Kellan Raffield, MD  [facility] Direct contact: [phone] --Via amion app OR  --[email or web address]; password [email or web address]  7PM-7AM contact night coverage as above 03/20/2018, 3:42 PM  LOS: 9 days   Consultants:  Multiple  Procedures:  None  Antimicrobials:  Currently Unasyn  Interval history/Subjective:  Awake alert not too verbal today Eating some per RN No other new issue  Objective:  Vitals:  Vitals:   03/20/18 1133 03/20/18 1133  BP: 122/83   Pulse:  89  Resp:  20  Temp:  98.7 F (37.1 C)  SpO2:  100%    Exam:  Less awake no icterus no pallor S1-S2 no murmur Abdomen soft nontender no rebound Chest is clear No lower extremity edema No rash has a Prevalon boot on the left leg Did not examine sacrum   I have personally reviewed the following:   Labs:  Sodium 148 potassium 3.3 chloride 114 BUN/creatinine 14/0.98   Imaging studies:  CXR 8/1 = slightly increased RUL opacity  Medical tests:  reviewed   Test discussed with performing physician:  no  Decision to obtain old records:  no  Review and summation of old records:  no  Scheduled Meds: . amoxicillin-clavulanate  1 tablet Oral Q12H  . baclofen  10 mg Oral TID  . clonazePAM  0.5 mg Oral Q24H  . clonazePAM  1 mg Oral BID  . collagenase   Topical Daily  . divalproex  500 mg Oral TID  . feeding supplement (PRO-STAT SUGAR FREE  64)  30 mL Oral Daily  . insulin aspart  0-9 Units Subcutaneous TID WC  . insulin detemir  10 Units Subcutaneous Daily  . mouth rinse  15 mL Mouth Rinse BID  . metoprolol tartrate  2.5 mg Intravenous Q6H  . metoprolol tartrate  50 mg Oral QHS  . metoprolol tartrate  75 mg Oral Daily  . multivitamin with minerals  1 tablet Oral Daily  . polyethylene glycol  17 g Oral Daily  . QUEtiapine  125 mg Oral QHS  . QUEtiapine  150 mg Oral Daily  . selenium sulfide  1 application Topical Q T,Th,Sat-1800  . sertraline  100 mg Oral Daily   Continuous Infusions: . sodium chloride 10 mL/hr at 03/20/18 1439  . magnesium sulfate 1 - 4 g bolus IVPB Stopped (03/19/18 1513)    Principal Problem:   Acute pyelonephritis Active Problems:   Depression   Diabetes mellitus without complication (HCC)   Hypertension   Spastic hemiplegia (HCC)   Sepsis (HCC)   AKI (acute kidney injury) (HCC)   Hydroureteronephrosis-right   Ureterovesical junction (UVJ) obstruction   Acute metabolic encephalopathy   Acute respiratory failure with hypoxia (HCC)   Pressure injury of skin   LOS: 9 days

## 2018-03-21 LAB — BASIC METABOLIC PANEL
ANION GAP: 8 (ref 5–15)
Anion gap: 7 (ref 5–15)
BUN: 16 mg/dL (ref 6–20)
BUN: 16 mg/dL (ref 6–20)
CALCIUM: 7.5 mg/dL — AB (ref 8.9–10.3)
CHLORIDE: 112 mmol/L — AB (ref 98–111)
CO2: 28 mmol/L (ref 22–32)
CO2: 26 mmol/L (ref 22–32)
Calcium: 7.2 mg/dL — ABNORMAL LOW (ref 8.9–10.3)
Chloride: 116 mmol/L — ABNORMAL HIGH (ref 98–111)
Creatinine, Ser: 0.89 mg/dL (ref 0.61–1.24)
Creatinine, Ser: 0.85 mg/dL (ref 0.61–1.24)
GFR calc Af Amer: >60 mL/min (ref >60)
GFR calc Af Amer: >60 mL/min (ref >60)
GFR calc non Af Amer: >60 mL/min (ref >60)
GFR calc non Af Amer: >60 mL/min (ref >60)
GLUCOSE: 143 mg/dL — AB (ref 70–99)
Glucose, Bld: 103 mg/dL — ABNORMAL HIGH (ref 70–99)
Potassium: 3.5 mmol/L (ref 3.5–5.1)
Potassium: 3.5 mmol/L (ref 3.5–5.1)
SODIUM: 151 mmol/L — AB (ref 135–145)
Sodium: 146 mmol/L — ABNORMAL HIGH (ref 135–145)

## 2018-03-21 LAB — CBC WITH DIFFERENTIAL/PLATELET
Abs Immature Granulocytes: 0.4 10*3/uL — ABNORMAL HIGH (ref 0.0–0.1)
BASOS ABS: 0 10*3/uL (ref 0.0–0.1)
Basophils Relative: 0 %
EOS PCT: 1 %
Eosinophils Absolute: 0.2 10*3/uL (ref 0.0–0.7)
HCT: 29.4 % — ABNORMAL LOW (ref 39.0–52.0)
Hemoglobin: 8.5 g/dL — ABNORMAL LOW (ref 13.0–17.0)
IMMATURE GRANULOCYTES: 3 %
Lymphocytes Relative: 21 %
Lymphs Abs: 3.2 10*3/uL (ref 0.7–4.0)
MCH: 25.7 pg — ABNORMAL LOW (ref 26.0–34.0)
MCHC: 28.9 g/dL — ABNORMAL LOW (ref 30.0–36.0)
MCV: 88.8 fL (ref 78.0–100.0)
Monocytes Absolute: 1.2 10*3/uL — ABNORMAL HIGH (ref 0.1–1.0)
Monocytes Relative: 8 %
NEUTROS PCT: 67 %
Neutro Abs: 10.3 10*3/uL — ABNORMAL HIGH (ref 1.7–7.7)
Platelets: 266 10*3/uL (ref 150–400)
RBC: 3.31 MIL/uL — AB (ref 4.22–5.81)
RDW: 17.4 % — ABNORMAL HIGH (ref 11.5–15.5)
WBC: 15.4 10*3/uL — AB (ref 4.0–10.5)

## 2018-03-21 LAB — OSMOLALITY, URINE
OSMOLALITY UR: 444 mosm/kg (ref 300–900)
Osmolality, Ur: 351 mOsm/kg (ref 300–900)

## 2018-03-21 LAB — GLUCOSE, CAPILLARY
GLUCOSE-CAPILLARY: 110 mg/dL — AB (ref 70–99)
Glucose-Capillary: 118 mg/dL — ABNORMAL HIGH (ref 70–99)
Glucose-Capillary: 110 mg/dL — ABNORMAL HIGH (ref 70–99)
Glucose-Capillary: 125 mg/dL — ABNORMAL HIGH (ref 70–99)

## 2018-03-21 MED ORDER — DEXTROSE 5 % IV SOLN
INTRAVENOUS | Status: DC
  Administered 2018-03-21: 23:00:00 via INTRAVENOUS

## 2018-03-21 MED ORDER — DESMOPRESSIN ACETATE 4 MCG/ML IJ SOLN
4.0000 ug | Freq: Once | INTRAMUSCULAR | Status: AC
  Administered 2018-03-21: 4 ug via INTRAVENOUS
  Filled 2018-03-21: qty 1

## 2018-03-21 NOTE — Progress Notes (Signed)
[facility] PROGRESS NOTE  Yaser Harvill ZOX:[id number] DOB: 07-30-1966 DOA: 03/10/2018 PCP: [name], MD   Narrative: 52 year old male TBI, spastic left hemiplegia DM TY 2, hypothyroid, bipolar Admitted 03/11/2018 increasing lethargy X 48 hours Hypoxic 70s no facial droop hypotensive systolic to 70s Admitted with sepsis protocol started-found to have AKI creatinine 2.1 BUN 40 T-max 102.6 WBC 15 CT abdomen pelvis right obstructive uropathy 7X5 UVJ stone CT head = extensive encephalomalacia slight interval enlargement of the ventricular system compared to 2009 CT Admitted with presumed sepsis from acute pyelonephritis started on Vanco Zosyn given 4 L of fluid and kept on stepdown unit Urologist was consulted underwent cystoscopy and placement of 6 French X 24 cm JJ stent 7/25 Hospitalization complicated by persisting fever and sepsis  A & Plan Sepsis-multiple components-treated initially with Vanco Zosyn and ID consulted as persisting fevers - CXR signs and symptoms of aspiration-SLP has seen-thin liquids, DYS 1 diet-mentation has improved--now Augmentin as eating Appreciate NS / Urology weigh in  Severe hypernatremia-working up for Central DI- stop forcing oral fluids Defer planning to nephrology  TBI with resulting left hemiplegia-continue baclofen 10 3 times daily, Klonopin 0.5 q. 24, Depakote 500 3 times daily Needs follow-up in 1 to 2 weeks for shunt evaluation after antibiotics are through-we will cc Dr. Wynetta Emery who saw him on 8/1 to arrange follow-up  Chronic aspiration-recommended dysphagia 1 thin liquid he will need to be at 45 degrees at all times-he is at high risk for recurrent aspiration and worsening of condition  Bipolar-continue Seroquel 125 at bedtime 150 daily and Zoloft 100 at bedtime  Stage III pressure injury post R thig-L ischial injury--turn and re-orient  Diabetes mellitus type 2-continue Levemir daily and units and sliding scale is controlled 10 3-1  80  Acute metabolic encephalopathy-resolved for now  Acute respiratory failure-chest x-ray as above  Normocytic anemia stable  Stage 3 pressure ulcer Post R thigh/  Hypertension continue metoprolol 75 daily and 50 q PM   DVT prophylaxis: lovenoex  Code Status: F   Family Communication: called daughter-no return call--left disposition Plan: Skilled care nexst week hope so +/- palliative care   Devion Chriscoe, MD  [facility] Direct contact: [phone] --Via amion app OR  --[email or web address]; password [email or web address]  7PM-7AM contact night coverage as above 03/21/2018, 5:34 PM  LOS: 10 days   Consultants:  Multiple  Procedures:  None  Antimicrobials:  Currently Unasyn  Interval history/Subjective:  Awake alert grinding his teeth in nad No cp no fever no chills no n/v/cp Other ROS is not possible but he seems more alert than prior  Objective:  Vitals:  Vitals:   03/21/18 0906 03/21/18 1631  BP:    Pulse: (!) 102 99  Resp:    Temp:  99.1 F (37.3 C)  SpO2:  91%    Exam:  Less awake no icterus no pallor S1-S2 no murmur Abdomen soft nontender no rebound Chest is clear No lower extremity edema No rash has a Prevalon boot on the left leg Did not examine sacrum   I have personally reviewed the following:   Labs:  Sodium 148-->151, CL 116   potassium 3.5    BUN/creatinine 14/0.98  WBC 18--->15  Hemoglobin 8.5   Imaging studies:  CXR 8/1 = slightly increased RUL opacity  Medical tests:  reviewed   Test discussed with performing physician:  no  Decision to obtain old records:  no  Review and summation of old records:  no  Scheduled Meds: . amoxicillin-clavulanate  1 tablet Oral Q12H  . baclofen  10 mg Oral TID  . clonazePAM  0.5 mg Oral Q24H  . clonazePAM  1 mg Oral BID  . collagenase   Topical Daily  . divalproex  500 mg Oral TID  . feeding supplement (PRO-STAT SUGAR FREE 64)  30 mL Oral Daily  . free water  200 mL Oral Q8H  .  insulin aspart  0-9 Units Subcutaneous TID WC  . insulin detemir  10 Units Subcutaneous Daily  . mouth rinse  15 mL Mouth Rinse BID  . metoprolol tartrate  2.5 mg Intravenous Q6H  . metoprolol tartrate  50 mg Oral QHS  . metoprolol tartrate  75 mg Oral Daily  . multivitamin with minerals  1 tablet Oral Daily  . polyethylene glycol  17 g Oral Daily  . QUEtiapine  125 mg Oral QHS  . QUEtiapine  150 mg Oral Daily  . selenium sulfide  1 application Topical Q T,Th,Sat-1800  . sertraline  100 mg Oral Daily   Continuous Infusions: . sodium chloride 10 mL/hr at 03/20/18 1439  . magnesium sulfate 1 - 4 g bolus IVPB Stopped (03/19/18 1513)    Principal Problem:   Acute pyelonephritis Active Problems:   Depression   Diabetes mellitus without complication (HCC)   Hypertension   Spastic hemiplegia (HCC)   Sepsis (HCC)   AKI (acute kidney injury) (HCC)   Hydroureteronephrosis-right   Ureterovesical junction (UVJ) obstruction   Acute metabolic encephalopathy   Acute respiratory failure with hypoxia (HCC)   Pressure injury of skin   LOS: 10 days

## 2018-03-21 NOTE — Progress Notes (Signed)
[name] ROUNDING NOTE   Subjective:   This morning awake and talking.  Drank several drinks of water when I offered it to him with a straw but did not finish a 6 ounce cup with prompting.  Objective:  Vital signs in last 24 hours:  Temp:  [98.5 F (36.9 C)-99.7 F (37.6 C)] 98.5 F (36.9 C) (08/04 0453) Pulse Rate:  [102-108] 102 (08/04 0906) Resp:  [19-23] 23 (08/04 0453) BP: (136-161)/(79-92) 144/89 (08/04 0546) SpO2:  [95 %-98 %] 95 % (08/04 0453) Weight:  [65.3 kg (144 lb)] 65.3 kg (144 lb) (08/04 0453)  Weight change: 3.629 kg (8 lb) Filed Weights   03/19/18 0400 03/20/18 0500 03/21/18 0453  Weight: 67.6 kg (149 lb) 61.7 kg (136 lb) 65.3 kg (144 lb)   Intake/Output: I/O last 3 completed shifts: In: 1360 [P.O.:1040; NG/GT:200; IV Piggyback:120] Out: 3725 [Urine:3725]   Intake/Output this shift:  Total I/O In: -  Out: 1700 [Urine:1700]  Physical Exam: General-awake, talking some CVS- RRR, no mrg's Lungs - loose rhonchi anteriorly ABD- BS present soft non-distended EXT- no edema Neuro - awake will interact today, grinding teeth and mumbling mainly   Basic Metabolic Panel: Recent Labs  Lab 03/18/18 2023 03/19/18 0027 03/19/18 0423 03/20/18 0744 03/21/18 0454  NA 148* 149* 148* 148* 151*  K 3.6 3.4* 3.3* 3.6 3.5  CL 114* 114* 113* 114* 116*  CO2 25 27 28 26 28   GLUCOSE 136* 126* 141* 79 103*  BUN 16 18 17 14 16   CREATININE 1.10 1.12 1.07 0.98 0.89  CALCIUM 6.9* 6.9* 6.9* 7.2* 7.5*  MG  --   --  1.3* 2.0  --     Liver Function Tests: Recent Labs  Lab 03/15/18 0504 03/20/18 0744  AST 39 25  ALT 24 20  ALKPHOS 102 96  BILITOT 0.9 0.3  PROT 7.3 7.6  ALBUMIN 1.6* 1.6*   No results for input(s): LIPASE, AMYLASE in the last 168 hours. No results for input(s): AMMONIA in the last 168 hours.  CBC: Recent Labs  Lab 03/16/18 0511 03/17/18 0521 03/18/18 0925 03/20/18 0744 03/21/18 0454  WBC 13.7* 15.9* 18.0* 18.8* 15.4*   NEUTROABS  --   --  11.6* 12.9* 10.3*  HGB 9.7* 9.6* 8.8* 7.8* 8.5*  HCT 32.0* 31.5* 29.1* 26.1* 29.4*  MCV 85.3 86.8 86.1 87.0 88.8  PLT 203 192 194 206 266    Cardiac Enzymes: No results for input(s): CKTOTAL, CKMB, CKMBINDEX, TROPONINI in the last 168 hours.  BNP: Invalid input(s): POCBNP  CBG: Recent Labs  Lab 03/20/18 1134 03/20/18 1553 03/20/18 2055 03/21/18 0741 03/21/18 1155  GLUCAP 99 89 105* 118* 110*    Microbiology: Results for orders placed or performed during the hospital encounter of 03/10/18  Culture, blood (Routine x 2)     Status: None   Collection Time: 03/10/18  7:46 PM  Result Value Ref Range Status   Specimen Description BLOOD RIGHT ANTECUBITAL  Final   Special Requests   Final    BOTTLES DRAWN AEROBIC AND ANAEROBIC Blood Culture adequate volume   Culture   Final    NO GROWTH 5 DAYS Performed at [facility], [address], [place], [place] [address]    Report Status 03/15/2018 FINAL  Final  Culture, blood (Routine x 2)     Status: None   Collection Time: 03/10/18  8:35 PM  Result Value Ref Range Status   Specimen Description BLOOD RIGHT HAND  Final  Special Requests   Final    BOTTLES DRAWN AEROBIC ONLY Blood Culture results may not be optimal due to an inadequate volume of blood received in culture bottles   Culture   Final    NO GROWTH 5 DAYS Performed at [facility], [address] [place], [place] [address]    Report Status 03/15/2018 FINAL  Final  Urine culture     Status: None   Collection Time: 03/10/18  9:10 PM  Result Value Ref Range Status   Specimen Description URINE, RANDOM  Final   Special Requests NONE  Final   Culture   Final    NO GROWTH Performed at [facility], [address] [place], [place] [address]    Report Status 03/12/2018 FINAL  Final  MRSA PCR Screening     Status: None   Collection Time: 03/11/18  5:15 AM  Result Value Ref Range Status   MRSA by PCR NEGATIVE NEGATIVE Final     Comment:        The GeneXpert MRSA Assay (FDA approved for NASAL specimens only), is one component of a comprehensive MRSA colonization surveillance program. It is not intended to diagnose MRSA infection nor to guide or monitor treatment for MRSA infections. Performed at [facility], [address] [place], [place] [address]   Culture, blood (routine x 2)     Status: None   Collection Time: 03/14/18 11:27 AM  Result Value Ref Range Status   Specimen Description BLOOD RIGHT HAND  Final   Special Requests   Final    BOTTLES DRAWN AEROBIC ONLY Blood Culture results may not be optimal due to an inadequate volume of blood received in culture bottles   Culture   Final    NO GROWTH 5 DAYS Performed at [facility], [address] [place], [place] [address]    Report Status 03/19/2018 FINAL  Final  Culture, blood (routine x 2)     Status: None   Collection Time: 03/14/18 11:31 AM  Result Value Ref Range Status   Specimen Description BLOOD RIGHT WRIST  Final   Special Requests   Final    BOTTLES DRAWN AEROBIC ONLY Blood Culture adequate volume   Culture   Final    NO GROWTH 5 DAYS Performed at [facility], [address] [place], [place] [address]    Report Status 03/19/2018 FINAL  Final   Coagulation Studies: No results for input(s): LABPROT, INR in the last 72 hours.  Urinalysis: No results for input(s): COLORURINE, LABSPEC, PHURINE, GLUCOSEU, HGBUR, BILIRUBINUR, KETONESUR, PROTEINUR, UROBILINOGEN, NITRITE, LEUKOCYTESUR in the last 72 hours.  Invalid input(s): APPERANCEUR    Imaging: No results found.   Medications:   . sodium chloride 10 mL/hr at 03/20/18 1439  . desmopressin (DDAVP) IV 4 mcg (03/21/18 1510)  . magnesium sulfate 1 - 4 g bolus IVPB Stopped (03/19/18 1513)   . amoxicillin-clavulanate  1 tablet Oral Q12H  . baclofen  10 mg Oral TID  . clonazePAM  0.5 mg Oral Q24H  . clonazePAM  1 mg Oral BID  . collagenase   Topical Daily  .  divalproex  500 mg Oral TID  . feeding supplement (PRO-STAT SUGAR FREE 64)  30 mL Oral Daily  . free water  200 mL Oral Q8H  . insulin aspart  0-9 Units Subcutaneous TID WC  . insulin detemir  10 Units Subcutaneous Daily  . mouth rinse  15 mL Mouth  Rinse BID  . metoprolol tartrate  2.5 mg Intravenous Q6H  . metoprolol tartrate  50 mg Oral QHS  . metoprolol tartrate  75 mg Oral Daily  . multivitamin with minerals  1 tablet Oral Daily  . polyethylene glycol  17 g Oral Daily  . QUEtiapine  125 mg Oral QHS  . QUEtiapine  150 mg Oral Daily  . selenium sulfide  1 application Topical Q T,Th,Sat-1800  . sertraline  100 mg Oral Daily   sodium chloride, acetaminophen, albuterol, dextromethorphan-guaiFENesin, HYDROcodone-acetaminophen, phenylephrine-shark liver oil-mineral oil-petrolatum  Patient Profile: Cody [name] is a 52 y.o. male with a PMHX notable for traumatic brain injury s/p ventriculoperitoneal shunt, spasmatic left hemiplegia, HTN, DMII, hypothyroidism, MDD, GAD, who presented to the ED with fever, abdominal pain, hypotension and worsening mental status from baseline from his SNF.   Assessment/ Plan:    **Hypernatremia, polyuria:   I was most suspicious for osmotic diuresis following obstruction (albeit unilateral) on admission as his polyuria was improving with his resolving AKI (+normal serum sodium on admission in setting of decreased mental status 48h prior). Unfortunately, his UOP has remained ~3L/day and his serum sodium has again trended up.  I think he most likely has central DI as well, but will send studies for this today.  --Check urine Osm this AM.  Completely empty foley bag of urine (discussed with RN).  Give DDAVP IV.  Check urine osm again.  --If central DI is proven we can decrease his free water losses with desmopressin administration.  Question if this will be enough to prevent hypernatremia, but it may at least help.  --This afternoon following DDAVP administration  if UOP falls would not give IV free water given risk for hyponatremia.   **Hypokalemia:  Resolved with supplement  **AKI:  Obstruction +/- infection related AKI.  Resolved.   Will continue to follow. Call with questions/concerns.  [name] MD

## 2018-03-21 NOTE — Plan of Care (Signed)
Attempted to turn patient but his sister was upset saying that nobody else has done this before. I tried to explain to her that as part of my assessment I needed to look at his skin and his wounds to make sure there were no additional problems and to see if the current wounds were healing and that the dressings were dry and intact but she was very upset stating that I was being mean to him and making him have pain. I got an order for some Hydrocodone and gave it to him and told her that we could try again later. I also tried to explain to her the importance of good mouthcare and she said that she would brush his teeth. I tried to show her how to use the [employer]isher[employer]er if she felt more comfortable but she really didn't want to do anything to make him start to get upset. I wanted to give him his prostat but she said that the pudding would be good enough and when I said that the pudding doesn't have the protein for wound healing, she didn't seem receptive, will try again later.

## 2018-03-22 ENCOUNTER — Inpatient Hospital Stay (HOSPITAL_COMMUNITY): Payer: Medicare Other

## 2018-03-22 DIAGNOSIS — A419 Sepsis, unspecified organism: Secondary | ICD-10-CM

## 2018-03-22 DIAGNOSIS — T17908A Unspecified foreign body in respiratory tract, part unspecified causing other injury, initial encounter: Secondary | ICD-10-CM

## 2018-03-22 DIAGNOSIS — G9341 Metabolic encephalopathy: Secondary | ICD-10-CM

## 2018-03-22 DIAGNOSIS — E870 Hyperosmolality and hypernatremia: Secondary | ICD-10-CM

## 2018-03-22 DIAGNOSIS — N200 Calculus of kidney: Secondary | ICD-10-CM

## 2018-03-22 DIAGNOSIS — Z515 Encounter for palliative care: Secondary | ICD-10-CM

## 2018-03-22 DIAGNOSIS — N179 Acute kidney failure, unspecified: Secondary | ICD-10-CM

## 2018-03-22 LAB — GLUCOSE, CAPILLARY
GLUCOSE-CAPILLARY: 130 mg/dL — AB (ref 70–99)
Glucose-Capillary: 94 mg/dL (ref 70–99)
Glucose-Capillary: 125 mg/dL — ABNORMAL HIGH (ref 70–99)
Glucose-Capillary: 137 mg/dL — ABNORMAL HIGH (ref 70–99)
Glucose-Capillary: 120 mg/dL — ABNORMAL HIGH (ref 70–99)

## 2018-03-22 LAB — CBC WITH DIFFERENTIAL/PLATELET
ABS IMMATURE GRANULOCYTES: 0.3 10*3/uL — AB (ref 0.0–0.1)
Basophils Absolute: 0 10*3/uL (ref 0.0–0.1)
Basophils Relative: 0 %
Eosinophils Absolute: 0.1 10*3/uL (ref 0.0–0.7)
Eosinophils Relative: 1 %
HEMATOCRIT: 25.5 % — AB (ref 39.0–52.0)
Hemoglobin: 7.5 g/dL — ABNORMAL LOW (ref 13.0–17.0)
Immature Granulocytes: 2 %
LYMPHS ABS: 4 10*3/uL (ref 0.7–4.0)
LYMPHS PCT: 27 %
MCH: 25.8 pg — ABNORMAL LOW (ref 26.0–34.0)
MCHC: 29.4 g/dL — ABNORMAL LOW (ref 30.0–36.0)
MCV: 87.6 fL (ref 78.0–100.0)
MONOS PCT: 10 %
Monocytes Absolute: 1.5 10*3/uL — ABNORMAL HIGH (ref 0.1–1.0)
NEUTROS ABS: 8.9 10*3/uL — AB (ref 1.7–7.7)
NEUTROS PCT: 60 %
Platelets: 279 10*3/uL (ref 150–400)
RBC: 2.91 MIL/uL — ABNORMAL LOW (ref 4.22–5.81)
RDW: 17.2 % — ABNORMAL HIGH (ref 11.5–15.5)
WBC: 14.7 10*3/uL — ABNORMAL HIGH (ref 4.0–10.5)

## 2018-03-22 LAB — BASIC METABOLIC PANEL
ANION GAP: 6 (ref 5–15)
BUN: 16 mg/dL (ref 6–20)
CO2: 28 mmol/L (ref 22–32)
CREATININE: 0.84 mg/dL (ref 0.61–1.24)
Calcium: 7.4 mg/dL — ABNORMAL LOW (ref 8.9–10.3)
Chloride: 118 mmol/L — ABNORMAL HIGH (ref 98–111)
GFR calc Af Amer: >60 mL/min (ref >60)
Glucose, Bld: 88 mg/dL (ref 70–99)
POTASSIUM: 3.7 mmol/L (ref 3.5–5.1)
Sodium: 152 mmol/L — ABNORMAL HIGH (ref 135–145)

## 2018-03-22 LAB — SODIUM
Sodium: 151 mmol/L — ABNORMAL HIGH (ref 135–145)
Sodium: 150 mmol/L — ABNORMAL HIGH (ref 135–145)

## 2018-03-22 MED ORDER — PRO-STAT SUGAR FREE PO LIQD
30.0000 mL | Freq: Two times a day (BID) | ORAL | Status: DC
  Administered 2018-03-22 – 2018-04-07 (×31): 30 mL via ORAL
  Filled 2018-03-22 (×28): qty 30

## 2018-03-22 MED ORDER — DESMOPRESSIN ACETATE SPRAY 0.01 % NA SOLN
10.0000 ug | Freq: Two times a day (BID) | NASAL | Status: DC
  Administered 2018-03-22 – 2018-03-24 (×4): 10 ug via NASAL
  Filled 2018-03-22 (×2): qty 5

## 2018-03-22 NOTE — Progress Notes (Signed)
Admit: 03/10/2018 LOS: 11  53M resolved AKI from obstruction with ongoing polyuria and hypernatremia  Subjective:  . UOsm increased from 351 to 444 after 4mcg desmopressing IV yesterday;  . SNa up to 152 this AM from 146 yesterday evening (after desmopressin) . UOP 3.6L yesterday . Not interactive right now  08/04 0701 - 08/05 0700 In: 1666.6 [P.O.:840; I.V.:776.6; IV Piggyback:50] Out: 3550 [Urine:3550]  Filed Weights   03/20/18 0500 03/21/18 0453 03/22/18 0500  Weight: 61.7 kg (136 lb) 65.3 kg (144 lb) 60.3 kg (133 lb)    Scheduled Meds: . amoxicillin-clavulanate  1 tablet Oral Q12H  . baclofen  10 mg Oral TID  . clonazePAM  0.5 mg Oral Q24H  . clonazePAM  1 mg Oral BID  . collagenase   Topical Daily  . divalproex  500 mg Oral TID  . feeding supplement (PRO-STAT SUGAR FREE 64)  30 mL Oral BID BM  . insulin aspart  0-9 Units Subcutaneous TID WC  . insulin detemir  10 Units Subcutaneous Daily  . mouth rinse  15 mL Mouth Rinse BID  . metoprolol tartrate  2.5 mg Intravenous Q6H  . metoprolol tartrate  50 mg Oral QHS  . metoprolol tartrate  75 mg Oral Daily  . multivitamin with minerals  1 tablet Oral Daily  . polyethylene glycol  17 g Oral Daily  . QUEtiapine  125 mg Oral QHS  . QUEtiapine  150 mg Oral Daily  . selenium sulfide  1 application Topical Q T,Th,Sat-1800  . sertraline  100 mg Oral Daily   Continuous Infusions: . sodium chloride 10 mL/hr at 03/21/18 2353  . magnesium sulfate 1 - 4 g bolus IVPB Stopped (03/19/18 1513)   PRN Meds:.sodium chloride, acetaminophen, albuterol, dextromethorphan-guaiFENesin, HYDROcodone-acetaminophen, phenylephrine-shark liver oil-mineral oil-petrolatum  Current Labs: reviewed    Physical Exam:  Blood pressure 122/79, pulse (!) 107, temperature 100.2 F (37.9 C), temperature source Oral, resp. rate (!) 28, height 5\' 9"  (1.753 m), weight 60.3 kg (133 lb), SpO2 90 %. NAD, not interactive RRR Coarse BS B/l Foley in  place  A 1. Hypernatremia and POlyuria with UOsm increase significantly post desmopressin challenge; consistent with partial central DI; likely from his hx/o TBI 2. AKI, resolved 3. Hx/o TBI 4. BPAD 5. DM2  P . Trial of desmopressin 5mcg intranasal BID . NO IVFs . Ad Lib water intake . Strict I/Os . q8h BMP   Sabra Heckyan Roselyn Doby MD 03/22/2018, 1:09 PM  Recent Labs  Lab 03/21/18 0454 03/21/18 2139 03/22/18 0447  NA 151* 146* 152*  K 3.5 3.5 3.7  CL 116* 112* 118*  CO2 28 26 28   GLUCOSE 103* 143* 88  BUN 16 16 16   CREATININE 0.89 0.85 0.84  CALCIUM 7.5* 7.2* 7.4*   Recent Labs  Lab 03/20/18 0744 03/21/18 0454 03/22/18 0447  WBC 18.8* 15.4* 14.7*  NEUTROABS 12.9* 10.3* 8.9*  HGB 7.8* 8.5* 7.5*  HCT 26.1* 29.4* 25.5*  MCV 87.0 88.8 87.6  PLT 206 266 279

## 2018-03-22 NOTE — Progress Notes (Signed)
Consultation Note Date: 03/22/2018   Patient Name: Cody [name]  DOB: [birth year]  MRN: [id number]  Age / Sex: 52 y.o., male  PCP: [name], MD Referring Physician: Rhetta Mura, MD  Reason for Consultation: Establishing goals of care and Psychosocial/spiritual support  HPI/Patient Profile: 52 y.o. male  with past medical history of spastic hemiplegia, bipolar disorder, anxiety and depression, traumatic brain injury s/p VP shunt who was admitted on 03/10/2018 with lethargy and sepsis secondary to pyelonephritis.  He was found to have a large right UVJ stone and obstructive uropathy.  Neprhology has been following this admission for hypernatremia and possible DI.  There is strong concern that due to decreased cognition, Mr. Salzman will not drink enough fluid to prevent hypernatremia.  There is also concern that he is aspirating.  He is currently on a dysphagia 1 diet with thin liquids.  Clinical Assessment and Goals of Care:  I have reviewed medical records including EPIC notes, labs and imaging, received report from Dr. Mahala Menghini and bedside RN, assessed the patient and then scheduled a meeting with his sister [name] at 3:45 pm on 8/6  to discuss diagnosis prognosis, GOC, EOL wishes, disposition and options.   Primary Decision Maker:  NEXT OF KIN Sister and Mother    SUMMARY OF RECOMMENDATIONS    Aspiration precautions PMT meeting scheduled for 3:45 pm on 8/6 with family to complete a MOST form.  Code Status/Advance Care Planning:  Full   Prognosis:  Unfortunately if DI persists and cognition does not improve, patient will continue to aspirate and develop worsening hypernatremia.  Outside of the hospital (without IVF) the patient would likely have only weeks.  Discharge Planning: To Be Determined      Primary Diagnoses: Present on Admission: . Sepsis (HCC) . Spastic hemiplegia  (HCC) . Hypertension . Depression . AKI (acute kidney injury) (HCC) . Acute pyelonephritis . Hydroureteronephrosis-right . Ureterovesical junction (UVJ) obstruction . Acute metabolic encephalopathy . Acute respiratory failure with hypoxia (HCC)   I have reviewed the medical record, interviewed the patient and family, and examined the patient. The following aspects are pertinent.  Past Medical History:  Diagnosis Date  . Depression   . Diabetes mellitus without complication (HCC)   . GAD (generalized anxiety disorder)   . Hypertension   . Hypothyroidism   . Spastic hemiplegia (HCC)   . Thyroid disease    hypothyroidism   Social History   Socioeconomic History  . Marital status: Single    Spouse name: Not on file  . Number of children: Not on file  . Years of education: Not on file  . Highest education level: Not on file  Occupational History  . Not on file  Social Needs  . Financial resource strain: Not on file  . Food insecurity:    Worry: Not on file    Inability: Not on file  . Transportation needs:    Medical: Not on file    Non-medical: Not on file  Tobacco Use  . Smoking status: Never Smoker  .  Smokeless tobacco: Never Used  Substance and Sexual Activity  . Alcohol use: Never    Frequency: Never  . Drug use: Never  . Sexual activity: Not on file  Lifestyle  . Physical activity:    Days per week: Not on file    Minutes per session: Not on file  . Stress: Not on file  Relationships  . Social connections:    Talks on phone: Not on file    Gets together: Not on file    Attends religious service: Not on file    Active member of club or organization: Not on file    Attends meetings of clubs or organizations: Not on file    Relationship status: Not on file  Other Topics Concern  . Not on file  Social History Narrative  . Not on file   Family History  Problem Relation Age of Onset  . Hypertension Mother   . Stroke Mother   . Hypertension Father    . Hyperlipidemia Father   . COPD Father   . Heart disease Father   . Diabetes Mellitus II Father    Scheduled Meds: . amoxicillin-clavulanate  1 tablet Oral Q12H  . baclofen  10 mg Oral TID  . clonazePAM  0.5 mg Oral Q24H  . clonazePAM  1 mg Oral BID  . collagenase   Topical Daily  . divalproex  500 mg Oral TID  . feeding supplement (PRO-STAT SUGAR FREE 64)  30 mL Oral Daily  . insulin aspart  0-9 Units Subcutaneous TID WC  . insulin detemir  10 Units Subcutaneous Daily  . mouth rinse  15 mL Mouth Rinse BID  . metoprolol tartrate  2.5 mg Intravenous Q6H  . metoprolol tartrate  50 mg Oral QHS  . metoprolol tartrate  75 mg Oral Daily  . multivitamin with minerals  1 tablet Oral Daily  . polyethylene glycol  17 g Oral Daily  . QUEtiapine  125 mg Oral QHS  . QUEtiapine  150 mg Oral Daily  . selenium sulfide  1 application Topical Q T,Th,Sat-1800  . sertraline  100 mg Oral Daily   Continuous Infusions: . sodium chloride 10 mL/hr at 03/21/18 2353  . magnesium sulfate 1 - 4 g bolus IVPB Stopped (03/19/18 1513)   PRN Meds:.sodium chloride, acetaminophen, albuterol, dextromethorphan-guaiFENesin, HYDROcodone-acetaminophen, phenylephrine-shark liver oil-mineral oil-petrolatum Not on File Review of Systems Patient does not speak with me  Physical Exam  Calm, chronically ill appearing male, awake, alert, non-verbal CV irreg rhythm with 3/6 systolic murmur Resp no distress Abdomen distended, non tender Lower ext in prevlon boots, muscle atrophy noted.  Vital Signs: BP 125/75 (BP Location: Left Arm)   Pulse 92   Temp (!) 101 F (38.3 C) (Axillary)   Resp 18   Ht 5\' 9"  (1.753 m)   Wt 60.3 kg (133 lb)   SpO2 97%   BMI 19.64 kg/m  Pain Scale: 0-10 POSS *See Group Information*: S-Acceptable,Sleep, easy to arouse Pain Score: 0-No pain   SpO2: SpO2: 97 % O2 Device:SpO2: 97 % O2 Flow Rate: .O2 Flow Rate (L/min): 2 L/min  IO: Intake/output summary:   Intake/Output Summary  (Last 24 hours) at 03/22/2018 1156 Last data filed at 03/22/2018 0800 Gross per 24 hour  Intake 1786.58 ml  Output 3550 ml  Net -1763.42 ml    LBM: Last BM Date: 03/22/18 Baseline Weight: Weight: 71.2 kg (156 lb 15.5 oz) Most recent weight: Weight: 60.3 kg (133 lb)     Palliative Assessment/Data:  20%     Time In: 11:30 Time Out: 12:00 Time Total: 30 min. Greater than 50%  of this time was spent counseling and coordinating care related to the above assessment and plan.  Signed by: Norvel RichardsMarianne Chai Verdejo, PA-C Palliative Medicine Pager: [phone]  Please contact Palliative Medicine Team phone at [phone] for questions and concerns.  For individual provider: See Loretha StaplerAmion

## 2018-03-22 NOTE — Plan of Care (Signed)
  Problem: Pain Managment: Goal: General experience of comfort will improve Outcome: Progressing Note:  Pt has not complained of pain during my shift.
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
## 2018-03-23 DIAGNOSIS — E232 Diabetes insipidus: Secondary | ICD-10-CM

## 2018-03-23 LAB — CBC WITH DIFFERENTIAL/PLATELET
ABS IMMATURE GRANULOCYTES: 0.2 10*3/uL — AB (ref 0.0–0.1)
Basophils Absolute: 0 10*3/uL (ref 0.0–0.1)
Basophils Relative: 0 %
EOS PCT: 1 %
Eosinophils Absolute: 0.1 10*3/uL (ref 0.0–0.7)
HEMATOCRIT: 27.2 % — AB (ref 39.0–52.0)
HEMOGLOBIN: 8.2 g/dL — AB (ref 13.0–17.0)
Immature Granulocytes: 1 %
LYMPHS PCT: 13 %
Lymphs Abs: 2.1 10*3/uL (ref 0.7–4.0)
MCH: 26.3 pg (ref 26.0–34.0)
MCHC: 30.1 g/dL (ref 30.0–36.0)
MCV: 87.2 fL (ref 78.0–100.0)
MONOS PCT: 8 %
Monocytes Absolute: 1.2 10*3/uL — ABNORMAL HIGH (ref 0.1–1.0)
NEUTROS ABS: 12.3 10*3/uL — AB (ref 1.7–7.7)
Neutrophils Relative %: 77 %
Platelets: 337 10*3/uL (ref 150–400)
RBC: 3.12 MIL/uL — ABNORMAL LOW (ref 4.22–5.81)
RDW: 17.5 % — ABNORMAL HIGH (ref 11.5–15.5)
WBC: 16 10*3/uL — ABNORMAL HIGH (ref 4.0–10.5)

## 2018-03-23 LAB — GLUCOSE, CAPILLARY
Glucose-Capillary: 91 mg/dL (ref 70–99)
Glucose-Capillary: 128 mg/dL — ABNORMAL HIGH (ref 70–99)
Glucose-Capillary: 130 mg/dL — ABNORMAL HIGH (ref 70–99)
Glucose-Capillary: 141 mg/dL — ABNORMAL HIGH (ref 70–99)

## 2018-03-23 LAB — SODIUM
Sodium: 152 mmol/L — ABNORMAL HIGH (ref 135–145)
Sodium: 152 mmol/L — ABNORMAL HIGH (ref 135–145)
Sodium: 151 mmol/L — ABNORMAL HIGH (ref 135–145)

## 2018-03-23 MED ORDER — ACETAMINOPHEN 325 MG PO TABS
325.0000 mg | ORAL_TABLET | Freq: Once | ORAL | Status: AC
  Administered 2018-03-23: 325 mg via ORAL
  Filled 2018-03-23: qty 1

## 2018-03-23 MED ORDER — IBUPROFEN 400 MG PO TABS
400.0000 mg | ORAL_TABLET | Freq: Three times a day (TID) | ORAL | Status: DC | PRN
  Administered 2018-03-23 – 2018-04-06 (×9): 400 mg via ORAL
  Filled 2018-03-23 (×5): qty 1
  Filled 2018-03-23: qty 2
  Filled 2018-03-23 (×3): qty 1
  Filled 2018-03-23: qty 2
  Filled 2018-03-23 (×2): qty 1
  Filled 2018-03-23: qty 2

## 2018-03-23 MED ORDER — SODIUM CHLORIDE 0.9 % IV BOLUS: 500.0000 mL | Freq: Once | INTRAVENOUS | Status: DC

## 2018-03-23 NOTE — Progress Notes (Addendum)
Temp recheck after tylenol at ~1130 is 102.5 rectally. MD Samtani notified. I will continue to monitor the patient closely.   Sheppard Evensina Tru Leopard RN  Re paged at 1420; awaiting call back or orders.   Sheppard Evensina Galan Ghee RN

## 2018-03-23 NOTE — Progress Notes (Signed)
Patients axillary temp at 102. Patient given PO acetaminophen. Ice placed for cooling. Will continue to monitor.

## 2018-03-23 NOTE — Progress Notes (Signed)
Paged MD regarding rectal temp of 102.1.  Gave acetaminophen eariler.  Orders for more acetaminophen given by MD.
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
## 2018-03-23 NOTE — Progress Notes (Signed)
CSW continuing to follow and will support with disposition planning pending patient progress.  Abigail ButtsSusan Breslin Hemann, LCSWA [phone]

## 2018-03-23 NOTE — Progress Notes (Signed)
Admit: 03/10/2018 LOS: 12  54M resolved AKI from obstruction with ongoing polyuria and hypernatremia  Subjective:  . S Na stable but up. UOP down some . Drinks water if offered to him . More awake this AM   08/05 0701 - 08/06 0700 In: 1287.1 [P.O.:960; I.V.:327.1] Out: 1620 [Urine:1620]  Filed Weights   03/22/18 0500 03/23/18 0400 03/23/18 0540  Weight: 60.3 kg (133 lb) 58.1 kg (128 lb) 61.2 kg (135 lb)    Scheduled Meds: . amoxicillin-clavulanate  1 tablet Oral Q12H  . baclofen  10 mg Oral TID  . clonazePAM  0.5 mg Oral Q24H  . clonazePAM  1 mg Oral BID  . collagenase   Topical Daily  . desmopressin  10 mcg Nasal BID  . divalproex  500 mg Oral TID  . feeding supplement (PRO-STAT SUGAR FREE 64)  30 mL Oral BID BM  . insulin aspart  0-9 Units Subcutaneous TID WC  . insulin detemir  10 Units Subcutaneous Daily  . mouth rinse  15 mL Mouth Rinse BID  . metoprolol tartrate  2.5 mg Intravenous Q6H  . metoprolol tartrate  50 mg Oral QHS  . metoprolol tartrate  75 mg Oral Daily  . multivitamin with minerals  1 tablet Oral Daily  . polyethylene glycol  17 g Oral Daily  . QUEtiapine  125 mg Oral QHS  . QUEtiapine  150 mg Oral Daily  . selenium sulfide  1 application Topical Q T,Th,Sat-1800  . sertraline  100 mg Oral Daily   Continuous Infusions: . sodium chloride 10 mL/hr at 03/23/18 0400  . magnesium sulfate 1 - 4 g bolus IVPB Stopped (03/19/18 1513)   PRN Meds:.sodium chloride, acetaminophen, albuterol, dextromethorphan-guaiFENesin, HYDROcodone-acetaminophen, phenylephrine-shark liver oil-mineral oil-petrolatum  Current Labs: reviewed    Physical Exam:  Blood pressure 133/80, pulse (!) 118, temperature (!) 102 F (38.9 C), temperature source Axillary, resp. rate 20, height 5\' 9"  (1.753 m), weight 61.2 kg (135 lb), SpO2 95 %. NAD, not interactive RRR Coarse BS B/l Foley in place  A 1. 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 AKI, resolved 3. Hx/o TBI 4. BPAD 5. DM2  P . Cont desmopressin intranasal BID . NO IVFs . Ad Lib water intake; needs prompting to drink, will be a challenge . Strict I/Os . q8h BMP . Agree with palliatie inovlvement; it is hard to imagine him doing well if his DI does not resolve  Sabra Heck MD 03/23/2018, 12:15 PM  Recent Labs  Lab 03/21/18 0454 03/21/18 2139 03/22/18 0447 03/22/18 1443 03/22/18 2142 03/23/18 0528  NA 151* 146* 152* 151* 150* 152*  K 3.5 3.5 3.7  --   --   --   CL 116* 112* 118*  --   --   --   CO2 28 26 28   --   --   --   GLUCOSE 103* 143* 88  --   --   --   BUN 16 16 16   --   --   --   CREATININE 0.89 0.85 0.84  --   --   --   CALCIUM 7.5* 7.2* 7.4*  --   --   --    Recent Labs  Lab 03/21/18 0454 03/22/18 0447 03/23/18 0528  WBC 15.4* 14.7* 16.0*  NEUTROABS 10.3* 8.9* 12.3*  HGB 8.5* 7.5* 8.2*  HCT 29.4* 25.5* 27.2*  MCV  88.8 87.6 87.2  PLT 266 279 337

## 2018-03-23 NOTE — Progress Notes (Signed)
  Speech Language Pathology Treatment: Dysphagia  Patient Details Name: Cody [name] MRN: [id number] DOB: 07/10/1966 Today's Date: 03/23/2018 Time: 1540-1600 SLP Time Calculation (min) (ACUTE ONLY): 20 min  Assessment / Plan / Recommendation Clinical Impression  Pt was seen for dysphagia treatment.  Per report from pt's RN, pt is eating 100% of meals but is consistently showing signs of aspiration with all PO intake.  Pt had delayed coughing with cup sips of thin liquids and thickened nutritional supplement (Pro stat) but less coughing with pureed textures.  Most recent chest x rays indicate increased infiltrate at the right lung apex and stable faint infiltrates in the right midzone.  Fevers remained uncontrolled.  For now, recommend that pt remain on current diet of dys 1 textures and thin liquids given that he clearly finds eating and drinking to be pleasurable and appears to be at risk of at least intermittent aspiration episodes with most consistencies. Await results from palliative care meeting with family regarding goals of care although SLP discussed pertinent information with palliative care PA prior to treatment session. Overall prognosis appears poor given his multiple co-morbidities.  HPI HPI: Cody RanBobby Sliney is a 52 y.o. 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  Chest x-ray showed bilateral basilar subsegment atelectasis. Found to have multiple nonobstructing right renal calculi measuring up to 10 mm and underwent cystoscopy, right retrograde ureteropyelogram, placement of stent. [facility] he did not aspirate or penetrate, dealyed swallow (thin liq's and appears regular texture recommended). Followed by ST, increased coughing with RN, ST downgraded  to puree, continue thin. CXR 7/29 showed Dense focal areas of airspace filling throughout the lungs bilateral, likely infectious. SLP recommended MBS.      SLP Plan  Continue with current plan of care       Recommendations  Diet recommendations: Dysphagia 1 (puree);Thin liquid Liquids provided via: Cup;Straw Medication Administration: Crushed with puree Supervision: Staff to assist with self feeding;Full supervision/cueing for compensatory strategies Compensations: Minimize environmental distractions;Slow rate;Small sips/bites Postural Changes and/or Swallow Maneuvers: Seated upright 90 degrees                Oral Care Recommendations: Oral care BID Follow up Recommendations: Skilled Nursing facility SLP Visit Diagnosis: Dysphagia, oropharyngeal phase (R13.12) Plan: Continue with current plan of care       GO                PageMelanee Spry, Dayanna Pryce L 03/23/2018, 4:37 PM

## 2018-03-24 LAB — URINALYSIS, ROUTINE W REFLEX MICROSCOPIC
Bilirubin Urine: NEGATIVE
Glucose, UA: NEGATIVE mg/dL
KETONES UR: NEGATIVE mg/dL
NITRITE: NEGATIVE
PROTEIN: 30 mg/dL — AB
Specific Gravity, Urine: 1.017 (ref 1.005–1.030)
pH: 6 (ref 5.0–8.0)

## 2018-03-24 LAB — GLUCOSE, CAPILLARY
GLUCOSE-CAPILLARY: 142 mg/dL — AB (ref 70–99)
Glucose-Capillary: 91 mg/dL (ref 70–99)
Glucose-Capillary: 111 mg/dL — ABNORMAL HIGH (ref 70–99)
Glucose-Capillary: 138 mg/dL — ABNORMAL HIGH (ref 70–99)

## 2018-03-24 LAB — SODIUM
SODIUM: 154 mmol/L — AB (ref 135–145)
SODIUM: 152 mmol/L — AB (ref 135–145)
Sodium: 151 mmol/L — ABNORMAL HIGH (ref 135–145)

## 2018-03-24 LAB — OSMOLALITY, URINE: Osmolality, Ur: 569 mOsm/kg (ref 300–900)

## 2018-03-24 MED ORDER — DEXTROSE 5 % IV SOLN
INTRAVENOUS | Status: AC
  Administered 2018-03-24 – 2018-03-26 (×4): via INTRAVENOUS

## 2018-03-24 MED ORDER — ENOXAPARIN SODIUM 40 MG/0.4ML ~~LOC~~ SOLN
40.0000 mg | SUBCUTANEOUS | Status: DC
  Administered 2018-03-24 – 2018-03-30 (×7): 40 mg via SUBCUTANEOUS
  Filled 2018-03-24 (×7): qty 0.4

## 2018-03-24 NOTE — Progress Notes (Signed)
[facility] PROGRESS NOTE  Cody [name] [id number] DOB: 06/12/1966 DOA: 03/10/2018  PCP: Shelbie AmmonsHaque, Imran P, MD  Brief History/Interval Summary: 52 year old male TBI, spastic left hemiplegia DM 2, hypothyroid, bipolar. Admitted 03/11/2018 increasing lethargy X 48 hours. Admitted with sepsis protocol started-found to have AKI creatinine 2.1 BUN 40 T-max 102.6. WBC 15 CT abdomen pelvis right obstructive uropathy 7X5 UVJ stone. CT head = extensive encephalomalacia slight interval enlargement of the ventricular system compared to 2009 CT. Presumed sepsis from acute pyelonephritis started on Vanco Zosyn given 4 L of fluid and kept on stepdown unit. Urologist was consulted underwent cystoscopy and placement of 6 French X 24 cm JJ stent 7/25. Hospitalization complicated by persisting fever and sepsis.  Reason for Visit: Persistent fever  Consultants: Seen by urology.  Neurosurgery.  Infectious disease.  Nephrology.  Procedures:  On 7/25: Cystoscopy, right retrograde ureteropyelogram, fluoroscopic interpretation, placement of 6 French by 24 cm contour double-J stent without tether  Lower extremity Doppler study  Negative for DVT  Antibiotics: Patient has been on multiple different antibiotics.  He completed a course of Augmentin on 8/7.  Subjective/Interval History: Not very communicative.  He is awake alert though.  Mostly incoherent speech.  ROS: Unable to do.  Objective:  Vital Signs  Vitals:   03/23/18 2218 03/24/18 0035 03/24/18 0512 03/24/18 0747  BP:  95/65 118/71 129/77  Pulse:  90 91 94  Resp:  (!) 27 (!) 21 19  Temp: (!) 101 F (38.3 C) (!) 100.7 F (38.2 C) 99.1 F (37.3 C) 98.4 F (36.9 C)  TempSrc: Rectal Rectal Axillary Axillary  SpO2:  98% 95% 95%  Weight:   61.2 kg (135 lb)   Height:        Intake/Output Summary (Last 24 hours) at 03/24/2018 1123 Last data filed at 03/24/2018 1100 Gross per 24 hour  Intake 675.52 ml  Output 950 ml  Net -274.48 ml    Filed Weights   03/23/18 0400 03/23/18 0540 03/24/18 0512  Weight: 58.1 kg (128 lb) 61.2 kg (135 lb) 61.2 kg (135 lb)    General appearance: alert, distracted and no distress Head: Normocephalic, without obvious abnormality, atraumatic Resp: Diminished air entry at the bases.  No wheezing or rhonchi.  Few crackles.  Normal effort at rest. Cardio: regular rate and rhythm, S1, S2 normal, no murmur, click, rub or gallop GI: soft, non-tender; bowel sounds normal; no masses,  no organomegaly Extremities: Contracted extremities Pulses: 2+ and symmetric Neurologic: Grossly normal Multiple skin wounds unstageable.  No active infection.  Noted in the heel, left buttock.  Lab Results:  Data Reviewed: I have personally reviewed following labs and imaging studies  CBC: Recent Labs  Lab 03/18/18 0925 03/20/18 0744 03/21/18 0454 03/22/18 0447 03/23/18 0528  WBC 18.0* 18.8* 15.4* 14.7* 16.0*  NEUTROABS 11.6* 12.9* 10.3* 8.9* 12.3*  HGB 8.8* 7.8* 8.5* 7.5* 8.2*  HCT 29.1* 26.1* 29.4* 25.5* 27.2*  MCV 86.1 87.0 88.8 87.6 87.2  PLT 194 206 266 279 337    Basic Metabolic Panel: Recent Labs  Lab 03/19/18 0423 03/20/18 0744 03/21/18 0454 03/21/18 2139 03/22/18 0447  03/22/18 2142 03/23/18 0528 03/23/18 1211 03/23/18 2147 03/24/18 0547  NA 148* 148* 151* 146* 152*   < > 150* 152* 152* 151* 154*  K 3.3* 3.6 3.5 3.5 3.7  --   --   --   --   --   --   CL 113* 114* 116* 112* 118*  --   --   --   --   --   --  CO2 28 26 28 26 28   --   --   --   --   --   --   GLUCOSE 141* 79 103* 143* 88  --   --   --   --   --   --   BUN 17 14 16 16 16   --   --   --   --   --   --   CREATININE 1.07 0.98 0.89 0.85 0.84  --   --   --   --   --   --   CALCIUM 6.9* 7.2* 7.5* 7.2* 7.4*  --   --   --   --   --   --   MG 1.3* 2.0  --   --   --   --   --   --   --   --   --    < > = values in this interval not displayed.    GFR: Estimated Creatinine Clearance: 90.1 mL/min (by C-G formula based on SCr  of 0.84 mg/dL).  Liver Function Tests: Recent Labs  Lab 03/20/18 0744  AST 25  ALT 20  ALKPHOS 96  BILITOT 0.3  PROT 7.6  ALBUMIN 1.6*    CBG: Recent Labs  Lab 03/23/18 1122 03/23/18 1556 03/23/18 2038 03/24/18 0744 03/24/18 1114  GLUCAP 128* 130* 141* 91 111*     Recent Results (from the past 240 hour(s))  Culture, blood (routine x 2)     Status: None   Collection Time: 03/14/18 11:27 AM  Result Value Ref Range Status   Specimen Description BLOOD RIGHT HAND  Final   Special Requests   Final    BOTTLES DRAWN AEROBIC ONLY Blood Culture results may not be optimal due to an inadequate volume of blood received in culture bottles   Culture   Final    NO GROWTH 5 DAYS Performed at [facility], [address], [place], [place] [address]    Report Status 03/19/2018 FINAL  Final  Culture, blood (routine x 2)     Status: None   Collection Time: 03/14/18 11:31 AM  Result Value Ref Range Status   Specimen Description BLOOD RIGHT WRIST  Final   Special Requests   Final    BOTTLES DRAWN AEROBIC ONLY Blood Culture adequate volume   Culture   Final    NO GROWTH 5 DAYS Performed at [facility], [address], [place], [place] [address]    Report Status 03/19/2018 FINAL  Final      Radiology Studies: No results found.   Medications:  Scheduled: . baclofen  10 mg Oral TID  . clonazePAM  0.5 mg Oral Q24H  . clonazePAM  1 mg Oral BID  . collagenase   Topical Daily  . desmopressin  10 mcg Nasal BID  . divalproex  500 mg Oral TID  . feeding supplement (PRO-STAT SUGAR FREE 64)  30 mL Oral BID BM  . insulin aspart  0-9 Units Subcutaneous TID WC  . insulin detemir  10 Units Subcutaneous Daily  . mouth rinse  15 mL Mouth Rinse BID  . metoprolol tartrate  2.5 mg Intravenous Q6H  . metoprolol tartrate  50 mg Oral QHS  . metoprolol tartrate  75 mg Oral Daily  . multivitamin with minerals  1 tablet Oral Daily  . polyethylene glycol  17 g Oral Daily  .  QUEtiapine  125 mg Oral QHS  . QUEtiapine  150 mg Oral Daily  . selenium sulfide  1 application Topical Q T,Th,Sat-1800  . sertraline  100 mg Oral Daily   Continuous: . sodium chloride 10 mL/hr at 03/24/18 1100  . dextrose 50 mL/hr at 03/24/18 1100  . magnesium sulfate 1 - 4 g bolus IVPB Stopped (03/19/18 1513)   OZH:YQMVHQ chloride, acetaminophen, albuterol, dextromethorphan-guaiFENesin, HYDROcodone-acetaminophen, ibuprofen, phenylephrine-shark liver oil-mineral oil-petrolatum  Assessment/Plan:    Sepsis likely multifactorial  He presented with sepsis physiology thought to be secondary to infection of the GU tract.  Patient was seen by urology due to obstructive uropathy.  Patient underwent stent placement.  Has a Foley.  Draining urine quite well.  Patient continues to have fevers.  Subsequently treated for aspiration pneumonia.  Infectious disease was consulted.  It looks like patient will be completing a course of Augmentin.  The patient however continues to spike fevers.  Repeat cultures negative so far.    No evidence for infection involving his skin ulcers.  No DVT in lower extremity Doppler study.  Could this be a drug-induced fever?  Since clinically otherwise he is doing well we could observe him off antibiotics. We will consider requesting ID to reevaluate patient.  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  Patient is on desmopressin.  Sodium levels are stable.  Encourage free water intake by mouth.  History of traumatic brain injury with left hemiplegia Patient is stable.  Patient has a shunt.  This was discussed with neurosurgery who did not feel like his symptoms were secondary to shunt malfunction.  Neurologically he appears to have improved.  Could be close to his baseline.  Acute kidney injury secondary to obstructive uropathy and has resolved.  Check labs tomorrow.  Chronic  aspiration/oropharyngeal dysphagia He is on dysphagia 1 diet.  Speech therapy has seen.  Remains at high risk for recurrent aspirations.  Palliative medicine is following.  History of bipolar disorder He is on medications which are being continued.  Stage 3 pressure injury  Noted in the right posterior thigh and left buttock area and left heel.  Wound care.  Diabetes mellitus type 2 Continue long-acting insulin and SSI.  Monitor CBGs.  Acute metabolic encephalopathy Most likely secondary to acute infection.  Appears to be close to baseline now.  Acute respiratory failure with hypoxia Secondary to aspiration.  Improved.  No PE noted on CT angiogram done on 7/29.  Normocytic anemia Stable.  Essential hypertension Monitor blood pressures closely.  Obstructive uropathy Status post stent placement.  Imaging studies did suggest a retained guidewire.  Urology is aware of this.   DVT Prophylaxis: Not noted to be on Lovenox.  Will be initiated.    Code Status: Full code Family Communication: No family at bedside Disposition Plan: Management as outlined above.    LOS: 13 days   Osvaldo Shipper  [facility] Pager [phone] 03/24/2018, 11:23 AM  If 7PM-7AM, please contact night-coverage at [email or web address], password [email or web address]

## 2018-03-24 NOTE — Progress Notes (Signed)
SLP Cancellation Note  Patient Details Name: Cody [name] MRN: [id number] DOB: 06/26/1966   Cancelled treatment:       Reason Eval/Treat Not Completed: Fatigue/lethargy limiting ability to participate; Pt was very lethargic this morning.  Would open his eyes to voice and touch but could not keep them open for more than a few seconds.  RN reports that pt was given pain medication prior to therapist's arrival.  SLP will continue to follow along for treatment as pt is able to participate.   Per palliative care note, pt's family wants to continue aggressive medical treatment of pt's multiple conditions and co-morbidities.      Cody [name], Cody [name] 03/24/2018, 9:47 AM

## 2018-03-24 NOTE — Progress Notes (Signed)
Admit: 03/10/2018 LOS: 13  31M resolved AKI from obstruction with ongoing polyuria and hypernatremia  Subjective:  . SNa up to 154 . Intake not documented . UOP 1.5L . Only rec 1 dose of desmopressin . Fevers . Palliatve care eval, full scope of care for now  08/06 0701 - 08/07 0700 In: -  Out: 1450 [Urine:1450]  Filed Weights   03/23/18 0400 03/23/18 0540 03/24/18 0512  Weight: 58.1 kg (128 lb) 61.2 kg (135 lb) 61.2 kg (135 lb)    Scheduled Meds: . baclofen  10 mg Oral TID  . clonazePAM  0.5 mg Oral Q24H  . clonazePAM  1 mg Oral BID  . collagenase   Topical Daily  . desmopressin  10 mcg Nasal BID  . divalproex  500 mg Oral TID  . feeding supplement (PRO-STAT SUGAR FREE 64)  30 mL Oral BID BM  . insulin aspart  0-9 Units Subcutaneous TID WC  . insulin detemir  10 Units Subcutaneous Daily  . mouth rinse  15 mL Mouth Rinse BID  . metoprolol tartrate  2.5 mg Intravenous Q6H  . metoprolol tartrate  50 mg Oral QHS  . metoprolol tartrate  75 mg Oral Daily  . multivitamin with minerals  1 tablet Oral Daily  . polyethylene glycol  17 g Oral Daily  . QUEtiapine  125 mg Oral QHS  . QUEtiapine  150 mg Oral Daily  . selenium sulfide  1 application Topical Q T,Th,Sat-1800  . sertraline  100 mg Oral Daily   Continuous Infusions: . sodium chloride 10 mL/hr at 03/23/18 0400  . dextrose    . magnesium sulfate 1 - 4 g bolus IVPB Stopped (03/19/18 1513)   PRN Meds:.sodium chloride, acetaminophen, albuterol, dextromethorphan-guaiFENesin, HYDROcodone-acetaminophen, ibuprofen, phenylephrine-shark liver oil-mineral oil-petrolatum  Current Labs: reviewed    Physical Exam:  Blood pressure 129/77, pulse 94, temperature 98.4 F (36.9 C), temperature source Axillary, resp. rate 19, height 5\' 9"  (1.753 m), weight 61.2 kg (135 lb), SpO2 95 %. NAD, not interactive RRR Coarse BS B/l Foley in place  A 1. Hypernatremia and Polyuria with inappropriately low UOsm and an increase significantly  post desmopressin challenge; consistent with partial central DI; likely from his hx/o TBI 2. AKI, resolved 3. Hx/o TBI 4. BPAD 5. DM2  P . Cont desmopressin 10mcg intranasal BID; Primary purpose here is to limit how much free water is necessary on a daily basis . He is not able to take in free water without prompting; and if not offered he will continue to have issues with dehydration and hypernatremia will try to normalize serum sodium by adding D5W today . Continue Ad Lib water intake;  Marland Kitchen. Strict I/Os . q8h Na . Appreciate palliative  inovlvement; it is hard to imagine him doing well if his DI does not resolve  Sabra Heckyan Shelbie Franken MD 03/24/2018, 9:09 AM  Recent Labs  Lab 03/21/18 0454 03/21/18 2139 03/22/18 0447  03/23/18 1211 03/23/18 2147 03/24/18 0547  NA 151* 146* 152*   < > 152* 151* 154*  K 3.5 3.5 3.7  --   --   --   --   CL 116* 112* 118*  --   --   --   --   CO2 28 26 28   --   --   --   --   GLUCOSE 103* 143* 88  --   --   --   --   BUN 16 16 16   --   --   --   --  CREATININE 0.89 0.85 0.84  --   --   --   --   CALCIUM 7.5* 7.2* 7.4*  --   --   --   --    < > = values in this interval not displayed.   Recent Labs  Lab 03/21/18 0454 03/22/18 0447 03/23/18 0528  WBC 15.4* 14.7* 16.0*  NEUTROABS 10.3* 8.9* 12.3*  HGB 8.5* 7.5* 8.2*  HCT 29.4* 25.5* 27.2*  MCV 88.8 87.6 87.2  PLT 266 279 337

## 2018-03-25 LAB — BASIC METABOLIC PANEL
ANION GAP: 7 (ref 5–15)
BUN: 24 mg/dL — ABNORMAL HIGH (ref 6–20)
CALCIUM: 7.4 mg/dL — AB (ref 8.9–10.3)
CO2: 27 mmol/L (ref 22–32)
CREATININE: 0.84 mg/dL (ref 0.61–1.24)
Chloride: 116 mmol/L — ABNORMAL HIGH (ref 98–111)
Glucose, Bld: 137 mg/dL — ABNORMAL HIGH (ref 70–99)
Potassium: 3.8 mmol/L (ref 3.5–5.1)
Sodium: 150 mmol/L — ABNORMAL HIGH (ref 135–145)

## 2018-03-25 LAB — SEDIMENTATION RATE: Sed Rate: 114 mm/hr — ABNORMAL HIGH (ref 0–16)

## 2018-03-25 LAB — SODIUM
SODIUM: 149 mmol/L — AB (ref 135–145)
SODIUM: 146 mmol/L — AB (ref 135–145)

## 2018-03-25 LAB — GLUCOSE, CAPILLARY
GLUCOSE-CAPILLARY: 125 mg/dL — AB (ref 70–99)
GLUCOSE-CAPILLARY: 126 mg/dL — AB (ref 70–99)
Glucose-Capillary: 90 mg/dL (ref 70–99)
Glucose-Capillary: 121 mg/dL — ABNORMAL HIGH (ref 70–99)

## 2018-03-25 LAB — CBC
HEMATOCRIT: 25.3 % — AB (ref 39.0–52.0)
Hemoglobin: 7.3 g/dL — ABNORMAL LOW (ref 13.0–17.0)
MCH: 25.8 pg — ABNORMAL LOW (ref 26.0–34.0)
MCHC: 28.9 g/dL — ABNORMAL LOW (ref 30.0–36.0)
MCV: 89.4 fL (ref 78.0–100.0)
Platelets: 322 10*3/uL (ref 150–400)
RBC: 2.83 MIL/uL — ABNORMAL LOW (ref 4.22–5.81)
RDW: 17.7 % — AB (ref 11.5–15.5)
WBC: 12.3 10*3/uL — AB (ref 4.0–10.5)

## 2018-03-25 MED ORDER — DESMOPRESSIN ACETATE SPRAY 0.01 % NA SOLN
20.0000 ug | Freq: Two times a day (BID) | NASAL | Status: DC
  Administered 2018-03-25: 20 ug via NASAL
  Filled 2018-03-25: qty 5

## 2018-03-25 MED ORDER — DESMOPRESSIN ACETATE SPRAY 0.01 % NA SOLN
20.0000 ug | Freq: Two times a day (BID) | NASAL | Status: DC
  Administered 2018-03-25 – 2018-04-06 (×25): 20 ug via NASAL
  Filled 2018-03-25 (×4): qty 5

## 2018-03-25 NOTE — Progress Notes (Signed)
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 Admitted 03/11/2018 increasing lethargy X 48 hours. Admitted with sepsis protocol started-found to have AKI creatinine 2.1 BUN 40 T-max 102.6. WBC 15 CT abdomen pelvis right obstructive uropathy 7X5 UVJ stone. CT head = extensive encephalomalacia slight interval enlargement of the ventricular system compared to 2009 CT. Presumed sepsis from acute pyelonephritis started on Vanco Zosyn given 4 L of fluid and kept on stepdown unit. Urologist was consulted underwent cystoscopy and placement of 6 French X 24 cm JJ stent 7/25. Hospitalization complicated by persisting fever and sepsis.  Reason for Visit: Persistent fever  Consultants: Seen by urology.  Neurosurgery.  Infectious disease.  Nephrology.  Procedures:  On 7/25: Cystoscopy, right retrograde ureteropyelogram, fluoroscopic interpretation, placement of 6 French by 24 cm contour double-J stent without tether  Lower extremity Doppler study  Negative for DVT  Antibiotics: Patient has been on multiple different antibiotics.  He completed a course of Augmentin on 8/7.  Subjective/Interval History: Patient not very communicative.  Difficult to obtain much information from him.    ROS: Unable to do.  Objective:  Vital Signs  Vitals:   03/25/18 0100 03/25/18 0542 03/25/18 0730 03/25/18 1135  BP: 113/73 113/68 117/69 118/72  Pulse: 99 83 81 87  Resp: 20 19 13    Temp: 99.9 F (37.7 C) 98.7 F (37.1 C) 98.7 F (37.1 C) 98.7 F (37.1 C)  TempSrc: Axillary Axillary Axillary Axillary  SpO2: 98% 100% 100% 100%  Weight:  61.7 kg    Height:        Intake/Output Summary (Last 24 hours) at 03/25/2018 1150 Last data filed at 03/25/2018 0900 Gross per 24 hour  Intake 1824.32 ml  Output 1600 ml  Net 224.32 ml    Filed Weights   03/23/18 0540 03/24/18 0512 03/25/18 0542  Weight: 61.2 kg 61.2 kg 61.7 kg    General appearance: Awake alert.  Distracted. Resp: Normal effort at rest.  Diminished air entry at the bases.  Few crackles.  No wheezing or rhonchi. Cardio: S1-S2 is normal regular.  No S3-S4.  No rubs murmurs or bruit GI: Abdomen is soft.  Nontender nondistended.  Bowel sounds are present.  No masses organomegaly Extremities: Contracted extremities Neurologic: Distracted.  Not very communicative.  Left-sided hemiparesis. Multiple skin wounds unstageable.  No active infection.  Noted in the heel, left buttock.  Lab Results:  Data Reviewed: I have personally reviewed following labs and imaging studies  CBC: Recent Labs  Lab 03/20/18 0744 03/21/18 0454 03/22/18 0447 03/23/18 0528 03/25/18 0447  WBC 18.8* 15.4* 14.7* 16.0* 12.3*  NEUTROABS 12.9* 10.3* 8.9* 12.3*  --   HGB 7.8* 8.5* 7.5* 8.2* 7.3*  HCT 26.1* 29.4* 25.5* 27.2* 25.3*  MCV 87.0 88.8 87.6 87.2 89.4  PLT 206 266 279 337 322    Basic Metabolic Panel: Recent Labs  Lab 03/19/18 0423 03/20/18 0744 03/21/18 0454 03/21/18 2139 03/22/18 0447  03/23/18 2147 03/24/18 0547 03/24/18 1242 03/24/18 2114 03/25/18 0447  NA 148* 148* 151* 146* 152*   < > 151* 154* 152* 151* 150*  K 3.3* 3.6 3.5 3.5 3.7  --   --   --   --   --  3.8  CL 113* 114* 116* 112* 118*  --   --   --   --   --  116*  CO2 28 26 28 26 28   --   --   --   --   --  27  GLUCOSE 141* 79 103* 143* 88  --   --   --   --   --  137*  BUN 17 14 16 16 16   --   --   --   --   --  24*  CREATININE 1.07 0.98 0.89 0.85 0.84  --   --   --   --   --  0.84  CALCIUM 6.9* 7.2* 7.5* 7.2* 7.4*  --   --   --   --   --  7.4*  MG 1.3* 2.0  --   --   --   --   --   --   --   --   --    < > = values in this interval not displayed.    GFR: Estimated Creatinine Clearance: 90.8 mL/min (by C-G formula based on SCr of 0.84 mg/dL).  Liver Function Tests: Recent Labs  Lab  03/20/18 0744  AST 25  ALT 20  ALKPHOS 96  BILITOT 0.3  PROT 7.6  ALBUMIN 1.6*    CBG: Recent Labs  Lab 03/24/18 1114 03/24/18 1627 03/24/18 2103 03/25/18 0729 03/25/18 1134  GLUCAP 111* 138* 142* 90 121*     Recent Results (from the past 240 hour(s))  Culture, blood (routine x 2)     Status: None (Preliminary result)   Collection Time: 03/23/18 12:35 AM  Result Value Ref Range Status   Specimen Description BLOOD LEFT WRIST  Final   Special Requests   Final    BOTTLES DRAWN AEROBIC ONLY Blood Culture results may not be optimal due to an inadequate volume of blood received in culture bottles   Culture   Final    NO GROWTH 1 DAY Performed at [facility], [address] [place], [place] [address]    Report Status PENDING  Incomplete  Culture, blood (routine x 2)     Status: None (Preliminary result)   Collection Time: 03/23/18 12:40 AM  Result Value Ref Range Status   Specimen Description BLOOD LEFT HAND  Final   Special Requests   Final    BOTTLES DRAWN AEROBIC ONLY Blood Culture results may not be optimal due to an inadequate volume of blood received in culture bottles   Culture   Final    NO GROWTH 1 DAY Performed at [facility], [address] [place], [place] [address]    Report Status PENDING  Incomplete      Radiology Studies: No results found.   Medications:  Scheduled: . baclofen  10 mg Oral TID  . clonazePAM  0.5 mg Oral Q24H  . clonazePAM  1 mg Oral BID  . collagenase   Topical Daily  . desmopressin  20 mcg Nasal BID  . divalproex  500 mg Oral TID  . enoxaparin (LOVENOX) injection  40 mg Subcutaneous Q24H  . feeding supplement (PRO-STAT SUGAR FREE 64)  30 mL Oral BID BM  . insulin aspart  0-9 Units Subcutaneous TID WC  . insulin detemir  10 Units Subcutaneous Daily  . mouth rinse  15 mL Mouth Rinse BID  . metoprolol tartrate  2.5 mg Intravenous Q6H  . metoprolol tartrate  50 mg Oral QHS  . metoprolol tartrate  75 mg Oral Daily   . multivitamin with minerals  1 tablet Oral Daily  . polyethylene glycol  17 g Oral Daily  . QUEtiapine  125 mg Oral QHS  . QUEtiapine  150 mg Oral Daily  . selenium sulfide  1 application Topical Q T,Th,Sat-1800  . sertraline  100 mg Oral Daily   Continuous: . sodium chloride Stopped (03/24/18 1925)  . dextrose 75 mL/hr at 03/25/18 1020  . magnesium sulfate 1 - 4 g bolus IVPB Stopped (03/19/18 1513)   ZOX:WRUEAV chloride, acetaminophen, albuterol, dextromethorphan-guaiFENesin, HYDROcodone-acetaminophen, ibuprofen, phenylephrine-shark liver oil-mineral oil-petrolatum  Assessment/Plan:    Sepsis likely multifactorial/Persistent fever He presented with sepsis physiology thought to be secondary to infection of the GU tract.  Patient was seen by urology due to obstructive uropathy.  Patient underwent stent placement.  Has a Foley.  Draining urine quite well.  Subsequently treated for aspiration pneumonia.  Infectious disease was consulted.  It looks like patient will be completing a course of Augmentin.  Patient continued to have fevers.  Repeat blood cultures negative.  UA was done yesterday which does show positive leukocytes with WBCs.  But patient does have a indwelling Foley catheter.  He recently completed multiple courses of antibiotics.  Fever appears to be subsiding.  We will not treat this abnormal UA at this time.  No DVT was seen in the lower extremity Doppler study.  There was concern for drug-induced fever.  Continue to observe off of antibiotics.  If he continues to have persistent fever then we will consult ID.    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  Patient is on desmopressin.  Sodium levels are stable.  Encourage free water intake by mouth.  Patient started on dextrose infusion today.  History of traumatic brain injury with left hemiplegia Patient is stable.  Patient has a shunt.  This was  discussed with neurosurgery who did not feel like his symptoms were secondary to shunt malfunction.  Neurologically he appears to have improved.  Could be close to his baseline.  Unction is noted to be normal.  Chronic aspiration/oropharyngeal dysphagia He is on dysphagia 1 diet.  Speech therapy has seen.  Remains at high risk for recurrent aspirations.  Palliative medicine is following.  History of bipolar disorder He is on medications which are being continued.  Stage 3 pressure injury  Noted in the right posterior thigh and left buttock area and left heel.  Wound care.  No active infection noted.  Diabetes mellitus type 2 Continue long-acting insulin and SSI.  Monitor CBGs.  Acute metabolic encephalopathy Most likely secondary to acute infection.  Appears to be close to baseline now.  Acute respiratory failure with hypoxia Secondary to aspiration.  Improved.  No PE noted on CT angiogram done on 7/29.  Normocytic anemia Hemoglobin is low but stable.  No evidence of overt bleeding.  Essential hypertension Blood pressure is well controlled.  Patient is on metoprolol.  Obstructive uropathy Status post stent placement.  Imaging studies did suggest a retained guidewire.  Urology is aware of this.  Follow-up with urology.   DVT Prophylaxis: Lovenox Code Status: Full code Family Communication: No family at bedside Disposition Plan: Once medically stable he will need to go to a skilled nursing facility.  Electrolytes need to improve further.  Also need to wait to make sure that he does not have recurrence of fever.    LOS: 14 days   Osvaldo Shipper  [facility] Pager [phone] 03/25/2018, 11:50 AM  If 7PM-7AM, please contact night-coverage at [email or web address], password [facility]

## 2018-03-25 NOTE — Progress Notes (Addendum)
Sat with patient briefly.  Discussed with RN.  He is eating 100% of meals and drinking without difficulty when offered.   Patient appears to be comfortable on his current medications.  Labs seem to be slowly improving.    U/A from 8/7 appears to show infection/pyuria.  Last fever was 100.2 yesterday evening.  I will touch base with his sister Junious DresserConnie today to offer support and answer any questions.  Goals of care are set at this point.  Palliative Medicine will follow peripherally.  Please call our office with we can be of assistance.  Norvel RichardsMarianne Hassell Patras, PA-C Palliative Medicine Pager: [phone]   No charge note.

## 2018-03-25 NOTE — Progress Notes (Signed)
Admit: 03/10/2018 LOS: 14  19M resolved AKI from obstruction with ongoing polyuria and hypernatremia  Subjective:  . SNa down to 150 . 2.3L in yesterday; 1.6L out, PO in was 0.9L . Ongoing fevers, B Cxs NGF or NGTD; not on ABX  08/07 0701 - 08/08 0700 In: 2259.8 [P.O.:890; I.V.:1369.8] Out: 1600 [Urine:1600]  Filed Weights   03/23/18 0540 03/24/18 0512 03/25/18 0542  Weight: 61.2 kg 61.2 kg 61.7 kg    Scheduled Meds: . baclofen  10 mg Oral TID  . clonazePAM  0.5 mg Oral Q24H  . clonazePAM  1 mg Oral BID  . collagenase   Topical Daily  . desmopressin  20 mcg Nasal BID  . divalproex  500 mg Oral TID  . enoxaparin (LOVENOX) injection  40 mg Subcutaneous Q24H  . feeding supplement (PRO-STAT SUGAR FREE 64)  30 mL Oral BID BM  . insulin aspart  0-9 Units Subcutaneous TID WC  . insulin detemir  10 Units Subcutaneous Daily  . mouth rinse  15 mL Mouth Rinse BID  . metoprolol tartrate  2.5 mg Intravenous Q6H  . metoprolol tartrate  50 mg Oral QHS  . metoprolol tartrate  75 mg Oral Daily  . multivitamin with minerals  1 tablet Oral Daily  . polyethylene glycol  17 g Oral Daily  . QUEtiapine  125 mg Oral QHS  . QUEtiapine  150 mg Oral Daily  . selenium sulfide  1 application Topical Q T,Th,Sat-1800  . sertraline  100 mg Oral Daily   Continuous Infusions: . sodium chloride Stopped (03/24/18 1925)  . dextrose 75 mL/hr at 03/25/18 1020  . magnesium sulfate 1 - 4 g bolus IVPB Stopped (03/19/18 1513)   PRN Meds:.sodium chloride, acetaminophen, albuterol, dextromethorphan-guaiFENesin, HYDROcodone-acetaminophen, ibuprofen, phenylephrine-shark liver oil-mineral oil-petrolatum  Current Labs: reviewed    Physical Exam:  Blood pressure 118/72, pulse 87, temperature 98.7 F (37.1 C), temperature source Axillary, resp. rate 13, height 5\' 9"  (1.753 m), weight 61.7 kg, SpO2 100 %. NAD, not interactive RRR Coarse BS B/l Foley in place  A 1. Hypernatremia and Polyuria with  inappropriately low UOsm and an increase significantly post desmopressin challenge; consistent with partial central DI; likely from his hx/o TBI 2. AKI, resolved 3. Hx/o TBI 4. BPAD 5. DM2  P . Increase desmopressin to 20 mcg intranasal twice daily; Primary purpose here is to limit how much free water is necessary on a daily basis . Increase D5 W to 75 mL's per hour; purpose is to normalize serum sodium and see if mental status improves . He is not able to take in free water without prompting; and if not offered he will continue to have issues with dehydration and hypernatremia will try to normalize serum sodium by adding D5W today . Continue Ad Lib water intake;  Marland Kitchen. Strict I/Os . q8h Na . Appreciate palliative  inovlvement; it is hard to imagine him doing well if his DI does not resolve  Sabra Heckyan Valeria Boza MD 03/25/2018, 12:19 PM  Recent Labs  Lab 03/21/18 2139 03/22/18 0447  03/24/18 1242 03/24/18 2114 03/25/18 0447  NA 146* 152*   < > 152* 151* 150*  K 3.5 3.7  --   --   --  3.8  CL 112* 118*  --   --   --  116*  CO2 26 28  --   --   --  27  GLUCOSE 143* 88  --   --   --  137*  BUN 16 16  --   --   --  24*  CREATININE 0.85 0.84  --   --   --  0.84  CALCIUM 7.2* 7.4*  --   --   --  7.4*   < > = values in this interval not displayed.   Recent Labs  Lab 03/21/18 0454 03/22/18 0447 03/23/18 0528 03/25/18 0447  WBC 15.4* 14.7* 16.0* 12.3*  NEUTROABS 10.3* 8.9* 12.3*  --   HGB 8.5* 7.5* 8.2* 7.3*  HCT 29.4* 25.5* 27.2* 25.3*  MCV 88.8 87.6 87.2 89.4  PLT 266 279 337 322

## 2018-03-25 NOTE — Progress Notes (Addendum)
Patient was turned due to having a BM.  He expresses pain with movement and verbal outburst as he tries to communicate.  I have been offering water for hydration.  Patient was given pain medication before movement in bed.  He has been sleeping tonight. I will keep monitoring patient.

## 2018-03-26 LAB — GLUCOSE, CAPILLARY
GLUCOSE-CAPILLARY: 108 mg/dL — AB (ref 70–99)
GLUCOSE-CAPILLARY: 88 mg/dL (ref 70–99)
Glucose-Capillary: 79 mg/dL (ref 70–99)
Glucose-Capillary: 105 mg/dL — ABNORMAL HIGH (ref 70–99)

## 2018-03-26 LAB — ANA W/REFLEX IF POSITIVE: Anti Nuclear Antibody(ANA): NEGATIVE

## 2018-03-26 LAB — SODIUM
SODIUM: 147 mmol/L — AB (ref 135–145)
SODIUM: 142 mmol/L (ref 135–145)
SODIUM: 141 mmol/L (ref 135–145)

## 2018-03-26 NOTE — Progress Notes (Signed)
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 Admitted 03/11/2018 increasing lethargy X 48 hours. Admitted with sepsis protocol started-found to have AKI creatinine 2.1 BUN 40 T-max 102.6. WBC 15 CT abdomen pelvis right obstructive uropathy 7X5 UVJ stone. CT head = extensive encephalomalacia slight interval enlargement of the ventricular system compared to 2009 CT. Presumed sepsis from acute pyelonephritis started on Vanco Zosyn given 4 L of fluid and kept on stepdown unit. Urologist was consulted underwent cystoscopy and placement of 6 French X 24 cm JJ stent 7/25. Hospitalization complicated by persisting fever and sepsis.  Reason for Visit: Persistent fever  Consultants: Seen by urology.  Neurosurgery.  Infectious disease.  Nephrology.  Procedures:  On 7/25: Cystoscopy, right retrograde ureteropyelogram, fluoroscopic interpretation, placement of 6 French by 24 cm contour double-J stent without tether  Lower extremity Doppler study  Negative for DVT  Antibiotics: Patient has been on multiple different antibiotics.  He completed a course of Augmentin on 8/7.  Subjective/Interval History: Patient remains encephalopathic.  ROS: Unable to do.  Objective:  Vital Signs  Vitals:   03/25/18 2100 03/26/18 0036 03/26/18 0500 03/26/18 0852  BP: 114/74 133/77 123/81 (!) 122/94  Pulse: 78 80 78 86  Resp: 19 20 19  (!) 22  Temp: 98.1 F (36.7 C)  98.1 F (36.7 C) 98 F (36.7 C)  TempSrc: Axillary  Oral Axillary  SpO2: 100% 100% 100%   Weight:   56.7 kg   Height:        Intake/Output Summary (Last 24 hours) at 03/26/2018 1048 Last data filed at 03/26/2018 8119 Gross per 24 hour  Intake 600 ml  Output 1400 ml  Net -800 ml   Filed Weights   03/24/18 0512 03/25/18 0542 03/26/18 0500  Weight: 61.2 kg 61.7  kg 56.7 kg    General appearance: Awake alert.  Distracted. Resp: Normal effort at rest.  Diminished air entry at the bases.  No crackles or wheezing appreciated today. Cardio: S1-S2 is normal regular.  No S3-S4.  No rubs murmurs or bruit GI: Abdomen remains soft.  Nontender nondistended.  Normal bowel sounds.  No masses organomegaly Extremities: Contracted extremities Neurologic: Distracted.  Not very communicative.  Left-sided hemiparesis. Multiple skin wounds unstageable.  No active infection noted in the heel, left buttock.  Lab Results:  Data Reviewed: I have personally reviewed following labs and imaging studies  CBC: Recent Labs  Lab 03/20/18 0744 03/21/18 0454 03/22/18 0447 03/23/18 0528 03/25/18 0447  WBC 18.8* 15.4* 14.7* 16.0* 12.3*  NEUTROABS 12.9* 10.3* 8.9* 12.3*  --   HGB 7.8* 8.5* 7.5* 8.2* 7.3*  HCT 26.1* 29.4* 25.5* 27.2* 25.3*  MCV 87.0 88.8 87.6 87.2 89.4  PLT 206 266 279 337 322    Basic Metabolic Panel: Recent Labs  Lab 03/20/18 0744 03/21/18 0454 03/21/18 2139 03/22/18 0447  03/24/18 2114 03/25/18 0447 03/25/18 1235 03/25/18 1914 03/26/18 0428  NA 148* 151* 146* 152*   < > 151* 150* 149* 146* 147*  K 3.6 3.5 3.5 3.7  --   --  3.8  --   --   --   CL 114* 116* 112* 118*  --   --  116*  --   --   --   CO2 26 28 26 28   --   --  27  --   --   --  GLUCOSE 79 103* 143* 88  --   --  137*  --   --   --   BUN 14 16 16 16   --   --  24*  --   --   --   CREATININE 0.98 0.89 0.85 0.84  --   --  0.84  --   --   --   CALCIUM 7.2* 7.5* 7.2* 7.4*  --   --  7.4*  --   --   --   MG 2.0  --   --   --   --   --   --   --   --   --    < > = values in this interval not displayed.    GFR: Estimated Creatinine Clearance: 83.4 mL/min (by C-G formula based on SCr of 0.84 mg/dL).  Liver Function Tests: Recent Labs  Lab 03/20/18 0744  AST 25  ALT 20  ALKPHOS 96  BILITOT 0.3  PROT 7.6  ALBUMIN 1.6*    CBG: Recent Labs  Lab 03/25/18 0729 03/25/18 1134  03/25/18 1621 03/25/18 2140 03/26/18 0744  GLUCAP 90 121* 125* 126* 79     Recent Results (from the past 240 hour(s))  Culture, blood (routine x 2)     Status: None (Preliminary result)   Collection Time: 03/23/18 12:35 AM  Result Value Ref Range Status   Specimen Description BLOOD LEFT WRIST  Final   Special Requests   Final    BOTTLES DRAWN AEROBIC ONLY Blood Culture results may not be optimal due to an inadequate volume of blood received in culture bottles   Culture   Final    NO GROWTH 2 DAYS Performed at [facility], [address] [place], [place] [address]    Report Status PENDING  Incomplete  Culture, blood (routine x 2)     Status: None (Preliminary result)   Collection Time: 03/23/18 12:40 AM  Result Value Ref Range Status   Specimen Description BLOOD LEFT HAND  Final   Special Requests   Final    BOTTLES DRAWN AEROBIC ONLY Blood Culture results may not be optimal due to an inadequate volume of blood received in culture bottles   Culture   Final    NO GROWTH 2 DAYS Performed at [facility], [address], [place], [place] [address]    Report Status PENDING  Incomplete      Radiology Studies: No results found.   Medications:  Scheduled: . baclofen  10 mg Oral TID  . clonazePAM  0.5 mg Oral Q24H  . clonazePAM  1 mg Oral BID  . collagenase   Topical Daily  . desmopressin  20 mcg Nasal BID  . divalproex  500 mg Oral TID  . enoxaparin (LOVENOX) injection  40 mg Subcutaneous Q24H  . feeding supplement (PRO-STAT SUGAR FREE 64)  30 mL Oral BID BM  . insulin aspart  0-9 Units Subcutaneous TID WC  . insulin detemir  10 Units Subcutaneous Daily  . mouth rinse  15 mL Mouth Rinse BID  . metoprolol tartrate  2.5 mg Intravenous Q6H  . metoprolol tartrate  50 mg Oral QHS  . metoprolol tartrate  75 mg Oral Daily  . multivitamin with minerals  1 tablet Oral Daily  . polyethylene glycol  17 g Oral Daily  . QUEtiapine  125 mg Oral QHS  . QUEtiapine  150  mg Oral Daily  . selenium sulfide  1 application Topical  Q T,Th,Sat-1800  . sertraline  100 mg Oral Daily   Continuous: . sodium chloride Stopped (03/24/18 1925)  . magnesium sulfate 1 - 4 g bolus IVPB Stopped (03/19/18 1513)   ZOX:WRUEAVPRN:sodium chloride, acetaminophen, albuterol, dextromethorphan-guaiFENesin, HYDROcodone-acetaminophen, ibuprofen, phenylephrine-shark liver oil-mineral oil-petrolatum  Assessment/Plan:    Sepsis likely multifactorial/Persistent fever He presented with sepsis physiology thought to be secondary to infection of the GU tract.  Patient was seen by urology due to obstructive uropathy.  Patient underwent stent placement.  Has a Foley.  Draining urine quite well.  Subsequently treated for aspiration pneumonia.  Infectious disease was consulted.  Patient was transitioned to Augmentin.  He has completed course of treatment.  Patient continued to have fevers.  Repeat blood cultures were negative.  Patient was taken off of antibiotics as there was concern for drug fever.  UA was done which showed positive leukocytes with WBCs.  But patient does have a indwelling Foley catheter.  He recently completed multiple courses of antibiotics.  We will not treat this abnormal UA at this time.  No DVT was seen in the lower extremity Doppler study.  It appears that his fever is subsiding.  Has not had one in the last 24 hours.  Continue to watch for now.  WBC also has been improving.  Repeat labs tomorrow.  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  Patient is on desmopressin.  Patient was started on dextrose infusion.  Sodium levels appear to be improving.  Repeat labs tomorrow.  Encourage free water intake by mouth.  History of traumatic brain injury with left hemiplegia Patient is stable.  Patient has a shunt.  This was discussed with neurosurgery who did not feel like his symptoms were secondary to shunt  malfunction.  Neurologically he appears to have improved.  Could be close to his baseline.    Chronic aspiration/oropharyngeal dysphagia He is on dysphagia 1 diet.  Speech therapy has seen.  Remains at high risk for recurrent aspirations.  Palliative medicine has been following.  Family still wants full scope of care.   History of bipolar disorder He is on medications which are being continued.  Stage 3 pressure injury  Noted in the right posterior thigh and left buttock area and left heel.  Wound care.  No active infection noted.  Diabetes mellitus type 2 Continue long-acting insulin and SSI.  Monitor CBGs.  Acute metabolic encephalopathy Most likely secondary to acute infection.  Appears to be close to baseline now.  Acute respiratory failure with hypoxia Secondary to aspiration.  Improved.  No PE noted on CT angiogram done on 7/29.  Normocytic anemia Hemoglobin is low but stable.  No evidence of overt bleeding.  Repeat labs tomorrow.  Essential hypertension Blood pressure is well controlled.  Patient is on metoprolol.  Obstructive uropathy Status post stent placement.  Imaging studies did suggest a retained guidewire.  Urology is aware of this.  Follow-up with urology after discharge.   DVT Prophylaxis: Lovenox Code Status: Full code Family Communication: No family at bedside Disposition Plan: Once medically stable he will need to go to a skilled nursing facility.  Electrolytes need to improve further.  He still remains on IV desmopressin.      LOS: 15 days   Osvaldo ShipperGokul Connor Foxworthy  [facility] Pager [phone] 03/26/2018, 10:48 AM  If 7PM-7AM, please contact night-coverage at [email or web address], password [email or web address]

## 2018-03-26 NOTE — Progress Notes (Signed)
S: afebrile since 03/24/18,  O:BP (!) 125/102 (BP Location: Left Arm)   Pulse 67   Temp 97.9 F (36.6 C) (Axillary)   Resp (!) 21   Ht 5\' 9"  (1.753 m)   Wt 56.7 kg   SpO2 96%   BMI 18.46 kg/m   Intake/Output Summary (Last 24 hours) at 03/26/2018 1742 Last data filed at 03/26/2018 1315 Gross per 24 hour  Intake 900 ml  Output 1400 ml  Net -500 ml   Intake/Output: I/O last 3 completed shifts: In: 1704.3 [P.O.:650; I.V.:1054.3] Out: 2100 [Urine:2100]  Intake/Output this shift:  Total I/O In: 780 [P.O.:780] Out: 650 [Urine:650] Weight change: -4.99 kg Gen: NAD, not following commands CVS: no rub Resp: cta Abd: benign Ext: no edema, multiple unstageable skin wounds, covered with bandages, contracted extremities  Recent Labs  Lab 03/20/18 0744 03/21/18 0454 03/21/18 2139 03/22/18 0447  03/24/18 1242 03/24/18 2114 03/25/18 0447 03/25/18 1235 03/25/18 1914 03/26/18 0428 03/26/18 1312  NA 148* 151* 146* 152*   < > 152* 151* 150* 149* 146* 147* 142  K 3.6 3.5 3.5 3.7  --   --   --  3.8  --   --   --   --   CL 114* 116* 112* 118*  --   --   --  116*  --   --   --   --   CO2 26 28 26 28   --   --   --  27  --   --   --   --   GLUCOSE 79 103* 143* 88  --   --   --  137*  --   --   --   --   BUN 14 16 16 16   --   --   --  24*  --   --   --   --   CREATININE 0.98 0.89 0.85 0.84  --   --   --  0.84  --   --   --   --   ALBUMIN 1.6*  --   --   --   --   --   --   --   --   --   --   --   CALCIUM 7.2* 7.5* 7.2* 7.4*  --   --   --  7.4*  --   --   --   --   AST 25  --   --   --   --   --   --   --   --   --   --   --   ALT 20  --   --   --   --   --   --   --   --   --   --   --    < > = values in this interval not displayed.   Liver Function Tests: Recent Labs  Lab 03/20/18 0744  AST 25  ALT 20  ALKPHOS 96  BILITOT 0.3  PROT 7.6  ALBUMIN 1.6*   No results for input(s): LIPASE, AMYLASE in the last 168 hours. No results for input(s): AMMONIA in the last 168  hours. CBC: Recent Labs  Lab 03/20/18 0744 03/21/18 0454 03/22/18 0447 03/23/18 0528 03/25/18 0447  WBC 18.8* 15.4* 14.7* 16.0* 12.3*  NEUTROABS 12.9* 10.3* 8.9* 12.3*  --   HGB 7.8* 8.5* 7.5* 8.2* 7.3*  HCT 26.1* 29.4* 25.5* 27.2* 25.3*  MCV 87.0 88.8  87.6 87.2 89.4  PLT 206 266 279 337 322   Cardiac Enzymes: No results for input(s): CKTOTAL, CKMB, CKMBINDEX, TROPONINI in the last 168 hours. CBG: Recent Labs  Lab 03/25/18 1621 03/25/18 2140 03/26/18 0744 03/26/18 1217 03/26/18 1724  GLUCAP 125* 126* 79 108* 88    Iron Studies: No results for input(s): IRON, TIBC, TRANSFERRIN, FERRITIN in the last 72 hours. Studies/Results: No results found. . baclofen  10 mg Oral TID  . clonazePAM  0.5 mg Oral Q24H  . clonazePAM  1 mg Oral BID  . collagenase   Topical Daily  . desmopressin  20 mcg Nasal BID  . divalproex  500 mg Oral TID  . enoxaparin (LOVENOX) injection  40 mg Subcutaneous Q24H  . feeding supplement (PRO-STAT SUGAR FREE 64)  30 mL Oral BID BM  . insulin aspart  0-9 Units Subcutaneous TID WC  . insulin detemir  10 Units Subcutaneous Daily  . mouth rinse  15 mL Mouth Rinse BID  . metoprolol tartrate  50 mg Oral QHS  . metoprolol tartrate  75 mg Oral Daily  . multivitamin with minerals  1 tablet Oral Daily  . polyethylene glycol  17 g Oral Daily  . QUEtiapine  125 mg Oral QHS  . QUEtiapine  150 mg Oral Daily  . selenium sulfide  1 application Topical Q T,Th,Sat-1800  . sertraline  100 mg Oral Daily    BMET    Component Value Date/Time   NA 142 03/26/2018 1312   K 3.8 03/25/2018 0447   CL 116 (H) 03/25/2018 0447   CO2 27 03/25/2018 0447   GLUCOSE 137 (H) 03/25/2018 0447   BUN 24 (H) 03/25/2018 0447   CREATININE 0.84 03/25/2018 0447   CALCIUM 7.4 (L) 03/25/2018 0447   GFRNONAA >60 03/25/2018 0447   GFRAA >60 03/25/2018 0447   CBC    Component Value Date/Time   WBC 12.3 (H) 03/25/2018 0447   RBC 2.83 (L) 03/25/2018 0447   HGB 7.3 (L) 03/25/2018 0447    HCT 25.3 (L) 03/25/2018 0447   PLT 322 03/25/2018 0447   MCV 89.4 03/25/2018 0447   MCH 25.8 (L) 03/25/2018 0447   MCHC 28.9 (L) 03/25/2018 0447   RDW 17.7 (H) 03/25/2018 0447   LYMPHSABS 2.1 03/23/2018 0528   MONOABS 1.2 (H) 03/23/2018 0528   EOSABS 0.1 03/23/2018 0528   BASOSABS 0.0 03/23/2018 0528     Assessment/Plan:  1. AKI due to obstructive uropathy- resolved 2. 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  This will need to be discussed with the family as his overall prognosis is poor.  Palliative care is following. 3. Pyelonephritis/sepsis- improved 4. Obstructive uropathy- s/p double j stent to right ureter 5. 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 AMS- multifactorial and likely related to AKI and infection.  Per primary he is improving 7. Chronic aspiration/oropharyngeal dysphagia- as above may need PEG.  High risk for recurrent aspirations. 8. DM- per primary 9. Disposition- poor overall prognosis.  Will sign off.  Please call with any questions or concerns.  Continue with ddavp and IVF's until he can take po on his own.   Irena Cords, MD [employer] [phone]

## 2018-03-26 NOTE — Care Management Important Message (Signed)
Important Message  Patient Details  Name: Cody [name] MRN: [id number] Date of Birth: 01/11/1966   Medicare Important Message Given:  Yes    Kensie Susman P Aniesa Boback 03/26/2018, 3:10 PM

## 2018-03-26 NOTE — Progress Notes (Signed)
CSW continuing to follow. Will support with discharge back to SNF when medically stable.  Abigail ButtsSusan Celestine Prim, LCSWA [phone]

## 2018-03-27 LAB — CBC
HCT: 27.1 % — ABNORMAL LOW (ref 39.0–52.0)
Hemoglobin: 8.1 g/dL — ABNORMAL LOW (ref 13.0–17.0)
MCH: 26 pg (ref 26.0–34.0)
MCHC: 29.9 g/dL — AB (ref 30.0–36.0)
MCV: 87.1 fL (ref 78.0–100.0)
PLATELETS: 318 10*3/uL (ref 150–400)
RBC: 3.11 MIL/uL — ABNORMAL LOW (ref 4.22–5.81)
RDW: 17.3 % — AB (ref 11.5–15.5)
WBC: 11.2 10*3/uL — ABNORMAL HIGH (ref 4.0–10.5)

## 2018-03-27 LAB — SODIUM: SODIUM: 140 mmol/L (ref 135–145)

## 2018-03-27 LAB — GLUCOSE, CAPILLARY
GLUCOSE-CAPILLARY: 96 mg/dL (ref 70–99)
GLUCOSE-CAPILLARY: 100 mg/dL — AB (ref 70–99)
Glucose-Capillary: 108 mg/dL — ABNORMAL HIGH (ref 70–99)
Glucose-Capillary: 119 mg/dL — ABNORMAL HIGH (ref 70–99)

## 2018-03-27 LAB — HEPATIC FUNCTION PANEL
ALT: 19 U/L (ref 0–44)
AST: 27 U/L (ref 15–41)
Albumin: 1.5 g/dL — ABNORMAL LOW (ref 3.5–5.0)
Alkaline Phosphatase: 96 U/L (ref 38–126)
BILIRUBIN INDIRECT: 0.3 mg/dL (ref 0.3–0.9)
BILIRUBIN TOTAL: 0.6 mg/dL (ref 0.3–1.2)
Bilirubin, Direct: 0.3 mg/dL — ABNORMAL HIGH (ref 0.0–0.2)
Total Protein: 8.4 g/dL — ABNORMAL HIGH (ref 6.5–8.1)

## 2018-03-27 LAB — BASIC METABOLIC PANEL
ANION GAP: 8 (ref 5–15)
BUN: 15 mg/dL (ref 6–20)
CALCIUM: 7.3 mg/dL — AB (ref 8.9–10.3)
CO2: 27 mmol/L (ref 22–32)
CREATININE: 0.72 mg/dL (ref 0.61–1.24)
Chloride: 105 mmol/L (ref 98–111)
GFR calc Af Amer: >60 mL/min (ref >60)
Glucose, Bld: 98 mg/dL (ref 70–99)
Potassium: 4.2 mmol/L (ref 3.5–5.1)
Sodium: 140 mmol/L (ref 135–145)

## 2018-03-27 NOTE — Progress Notes (Signed)
[facility] PROGRESS NOTE  Cody [name] WUJ:[id number] DOB: Feb 22, 1966 DOA: 03/10/2018  PCP: [name], MD  Brief History/Interval Summary: 52 year old male TBI, spastic left hemiplegia DM 2, hypothyroid, bipolar. Admitted 03/11/2018 increasing lethargy X 48 hours. Admitted with sepsis protocol started-found to have AKI creatinine 2.1 BUN 40 T-max 102.6. WBC 15 CT abdomen pelvis right obstructive uropathy 7X5 UVJ stone. CT head = extensive encephalomalacia slight interval enlargement of the ventricular system compared to 2009 CT. Presumed sepsis from acute pyelonephritis started on Vanco Zosyn given 4 L of fluid and kept on stepdown unit. Urologist was consulted underwent cystoscopy and placement of 6 French X 24 cm JJ stent 7/25. Hospitalization complicated by persisting fever and sepsis.  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  Nephrology was consulted.  Reason for Visit: Persistent fever  Consultants: Seen by urology.  Neurosurgery.  Infectious disease.  Nephrology.  Procedures:  On 7/25: Cystoscopy, right retrograde ureteropyelogram, fluoroscopic interpretation, placement of 6 French by 24 cm contour double-J stent without tether  Lower extremity Doppler study  Negative for DVT  Antibiotics: Patient has been on multiple different antibiotics.  He completed a course of Augmentin on 8/7.  Subjective/Interval History: Patient remains confused.  Unable to provide any information.   Objective:  Vital Signs  Vitals:   03/27/18 0017 03/27/18 0438 03/27/18 0821 03/27/18 0913  BP: 115/73 128/81 123/79   Pulse: 99 94 96 (!) 104  Resp: (!) 30 (!) 36 (!) 24   Temp: (!) 100.7 F (38.2 C) (!) 100.4 F (38 C) 98.3 F (36.8 C)   TempSrc: Axillary Axillary Axillary   SpO2: 99% 100% 99%   Weight:  61.7 kg    Height:        Intake/Output Summary (Last 24 hours) at 03/27/2018 1023 Last data filed at 03/27/2018 0854 Gross per 24 hour  Intake 900 ml    Output 2050 ml  Net -1150 ml   Filed Weights   03/25/18 0542 03/26/18 0500 03/27/18 0438  Weight: 61.7 kg 56.7 kg 61.7 kg    General appearance: Good.  Distracted. Resp: Normal effort at rest.  Diminished air entry at the bases.  No crackles or wheezing.. Cardio: S1-S2 is normal regular.  No S3-S4.  No rubs murmurs or bruit GI: Patient tends to tense up whenever his abdomen is being examined.  Unable to determine if he has any tenderness.  Seems to be soft. Extremities: Contracted extremities Neurologic: Distracted.  Not very communicative.  Left-sided hemiparesis. Multiple skin wounds unstageable.  No active infection noted in the heel, left buttock.  Lab Results:  Data Reviewed: I have personally reviewed following labs and imaging studies  CBC: Recent Labs  Lab 03/21/18 0454 03/22/18 0447 03/23/18 0528 03/25/18 0447 03/27/18 0507  WBC 15.4* 14.7* 16.0* 12.3* 11.2*  NEUTROABS 10.3* 8.9* 12.3*  --   --   HGB 8.5* 7.5* 8.2* 7.3* 8.1*  HCT 29.4* 25.5* 27.2* 25.3* 27.1*  MCV 88.8 87.6 87.2 89.4 87.1  PLT 266 279 337 322 318    Basic Metabolic Panel: Recent Labs  Lab 03/21/18 0454 03/21/18 2139 03/22/18 0447  03/25/18 0447  03/25/18 1914 03/26/18 0428 03/26/18 1312 03/26/18 2105 03/27/18 0507  NA 151* 146* 152*   < > 150*   < > 146* 147* 142 141 140  K 3.5 3.5 3.7  --  3.8  --   --   --   --   --  4.2  CL 116* 112* 118*  --  116*  --   --   --   --   --  105  CO2 28 26 28   --  27  --   --   --   --   --  27  GLUCOSE 103* 143* 88  --  137*  --   --   --   --   --  98  BUN 16 16 16   --  24*  --   --   --   --   --  15  CREATININE 0.89 0.85 0.84  --  0.84  --   --   --   --   --  0.72  CALCIUM 7.5* 7.2* 7.4*  --  7.4*  --   --   --   --   --  7.3*   < > = values in this interval not displayed.    GFR: Estimated Creatinine Clearance: 95.3 mL/min (by C-G formula based on SCr of 0.72 mg/dL).  Liver Function Tests: Recent Labs  Lab 03/27/18 0507  AST 27  ALT  19  ALKPHOS 96  BILITOT 0.6  PROT 8.4*  ALBUMIN 1.5*    CBG: Recent Labs  Lab 03/26/18 0744 03/26/18 1217 03/26/18 1724 03/26/18 2054 03/27/18 0825  GLUCAP 79 108* 88 105* 96     Recent Results (from the past 240 hour(s))  Culture, blood (routine x 2)     Status: None (Preliminary result)   Collection Time: 03/23/18 12:35 AM  Result Value Ref Range Status   Specimen Description BLOOD LEFT WRIST  Final   Special Requests   Final    BOTTLES DRAWN AEROBIC ONLY Blood Culture results may not be optimal due to an inadequate volume of blood received in culture bottles   Culture   Final    NO GROWTH 4 DAYS Performed at [facility], [address] [place], [place] [address]    Report Status PENDING  Incomplete  Culture, blood (routine x 2)     Status: None (Preliminary result)   Collection Time: 03/23/18 12:40 AM  Result Value Ref Range Status   Specimen Description BLOOD LEFT HAND  Final   Special Requests   Final    BOTTLES DRAWN AEROBIC ONLY Blood Culture results may not be optimal due to an inadequate volume of blood received in culture bottles   Culture   Final    NO GROWTH 4 DAYS Performed at [facility], [address], [place], [place] [address]    Report Status PENDING  Incomplete      Radiology Studies: No results found.   Medications:  Scheduled: . baclofen  10 mg Oral TID  . clonazePAM  0.5 mg Oral Q24H  . clonazePAM  1 mg Oral BID  . collagenase   Topical Daily  . desmopressin  20 mcg Nasal BID  . divalproex  500 mg Oral TID  . enoxaparin (LOVENOX) injection  40 mg Subcutaneous Q24H  . feeding supplement (PRO-STAT SUGAR FREE 64)  30 mL Oral BID BM  . insulin aspart  0-9 Units Subcutaneous TID WC  . insulin detemir  10 Units Subcutaneous Daily  . mouth rinse  15 mL Mouth Rinse BID  . metoprolol tartrate  50 mg Oral QHS  . metoprolol tartrate  75 mg Oral Daily  . multivitamin with minerals  1 tablet Oral Daily  . polyethylene glycol   17 g Oral Daily  . QUEtiapine  125 mg Oral QHS  .  QUEtiapine  150 mg Oral Daily  . selenium sulfide  1 application Topical Q T,Th,Sat-1800  . sertraline  100 mg Oral Daily   Continuous: . sodium chloride Stopped (03/24/18 1925)  . magnesium sulfate 1 - 4 g bolus IVPB Stopped (03/19/18 1513)   ZOX:WRUEAV chloride, acetaminophen, albuterol, dextromethorphan-guaiFENesin, HYDROcodone-acetaminophen, ibuprofen, phenylephrine-shark liver oil-mineral oil-petrolatum  Assessment/Plan:    Sepsis likely multifactorial/Persistent fever He presented with sepsis physiology thought to be secondary to infection of the GU tract.  Patient was seen by urology due to obstructive uropathy.  Patient underwent stent placement.  Has a Foley.  Subsequently treated for aspiration pneumonia.  Infectious disease was consulted.  Patient was transitioned to Augmentin.  He has completed course of treatment.  Patient continued to have fevers.  Repeat blood cultures were negative.  Patient was taken off of antibiotics as there was concern for drug fever.  UA was done which showed positive leukocytes with WBCs.  But patient does have a indwelling Foley catheter.  He recently completed multiple courses of antibiotics.  Decision was taken not to treat the abnormal UA.  Low-grade fever noted this morning.  Continue to watch for now.  If patient starts spiking fevers again we will need to reconsult ID.  WBC has been improving.  LFTs are normal.   Severe hypernatremia thought to be secondary to central diabetes insipidus/acute kidney injury By nephrology.  He was placed on desmopressin and dextrose infusion.  Sodium level has improved.  Dextrose has been discontinued.  Continue to monitor sodium levels closely.  Based on nursing notes it appears that he is tolerating his diet and drinking water.  He will need to be encouraged to do so even at nursing facility.  If he continues to be hyponatremic then will need further discussions with  family regarding further course of action which could include placing a feeding tube so that he can get free water that is if they want full scope of care.  History of traumatic brain injury with left hemiplegia Patient is stable.  Patient has a shunt.  This was discussed with neurosurgery who did not feel like his symptoms were secondary to shunt malfunction.  Neurologically he appears to have improved.  Could be close to his baseline.    Chronic aspiration/oropharyngeal dysphagia He is on dysphagia 1 diet.  Speech therapy has seen.  Remains at high risk for recurrent aspirations.  Palliative medicine has been following.   History of bipolar disorder He is on medications which are being continued.  Stage 3 pressure injury  Noted in the right posterior thigh and left buttock area and left heel.  Wound care.  No active infection noted.  Diabetes mellitus type 2 Continue long-acting insulin and SSI.  CBGs are reasonably well controlled.  Acute metabolic encephalopathy Most likely secondary to acute infection.  Appears to be close to baseline now.  Acute respiratory failure with hypoxia Secondary to aspiration.  Improved.  No PE noted on CT angiogram done on 7/29.  Normocytic anemia Hemoglobin remains stable.  No evidence for bleeding.  Essential hypertension Blood pressure is well controlled.  Patient is on metoprolol.  Obstructive uropathy Status post stent placement.  Imaging studies did suggest a retained guidewire.  Urology is aware of this.  Follow-up with urology after discharge.  He will need to be discharged with Foley catheter.   DVT Prophylaxis: Lovenox Code Status: Full code Family Communication: No family at bedside Disposition Plan: Once medically stable he will need to go  to a skilled nursing facility.  Electrolytes need to improve further.  He still remains on IV desmopressin.      LOS: 16 days   Osvaldo ShipperGokul Zakia Sainato  [facility] Pager [phone] 03/27/2018,  10:23 AM  If 7PM-7AM, please contact night-coverage at [email or web address], password [email or web address]

## 2018-03-28 ENCOUNTER — Inpatient Hospital Stay (HOSPITAL_COMMUNITY): Payer: Medicare Other

## 2018-03-28 LAB — CBC
HEMATOCRIT: 25.1 % — AB (ref 39.0–52.0)
HEMOGLOBIN: 7.4 g/dL — AB (ref 13.0–17.0)
MCH: 26.1 pg (ref 26.0–34.0)
MCHC: 29.5 g/dL — ABNORMAL LOW (ref 30.0–36.0)
MCV: 88.4 fL (ref 78.0–100.0)
Platelets: 316 10*3/uL (ref 150–400)
RBC: 2.84 MIL/uL — AB (ref 4.22–5.81)
RDW: 17.4 % — ABNORMAL HIGH (ref 11.5–15.5)
WBC: 11.5 10*3/uL — AB (ref 4.0–10.5)

## 2018-03-28 LAB — URINE CULTURE: Culture: >=100000 — AB

## 2018-03-28 LAB — BASIC METABOLIC PANEL
ANION GAP: 8 (ref 5–15)
BUN: 19 mg/dL (ref 6–20)
CHLORIDE: 106 mmol/L (ref 98–111)
CO2: 27 mmol/L (ref 22–32)
CREATININE: 0.83 mg/dL (ref 0.61–1.24)
Calcium: 7.5 mg/dL — ABNORMAL LOW (ref 8.9–10.3)
GFR calc non Af Amer: >60 mL/min (ref >60)
Glucose, Bld: 131 mg/dL — ABNORMAL HIGH (ref 70–99)
POTASSIUM: 3.7 mmol/L (ref 3.5–5.1)
Sodium: 141 mmol/L (ref 135–145)

## 2018-03-28 LAB — CULTURE, BLOOD (ROUTINE X 2)
Culture: NO GROWTH
Culture: NO GROWTH

## 2018-03-28 LAB — GLUCOSE, CAPILLARY
Glucose-Capillary: 83 mg/dL (ref 70–99)
Glucose-Capillary: 108 mg/dL — ABNORMAL HIGH (ref 70–99)
Glucose-Capillary: 117 mg/dL — ABNORMAL HIGH (ref 70–99)
Glucose-Capillary: 139 mg/dL — ABNORMAL HIGH (ref 70–99)

## 2018-03-28 LAB — URINALYSIS, ROUTINE W REFLEX MICROSCOPIC
Bilirubin Urine: NEGATIVE
Glucose, UA: NEGATIVE mg/dL
Ketones, ur: NEGATIVE mg/dL
Nitrite: NEGATIVE
Protein, ur: 100 mg/dL — AB
Specific Gravity, Urine: 1.015 (ref 1.005–1.030)
WBC, UA: >50 WBC/hpf — ABNORMAL HIGH (ref 0–5)
pH: 8 (ref 5.0–8.0)

## 2018-03-28 NOTE — Progress Notes (Signed)
PRN Tylenol given for Temp. 101.3 axillary was somewhat effective.  Current temp. 99.1.  Will continue to monitor.

## 2018-03-28 NOTE — Progress Notes (Addendum)
  Speech Language Pathology Treatment: Dysphagia  Patient Details Name: Cody [name] MRN: [id number] DOB: 11/06/1965 Today's Date: 03/28/2018 Time: [id number] SLP Time Calculation (min) (ACUTE ONLY): 22 min  Assessment / Plan / Recommendation Clinical Impression  Pt constantly grinds his teeth even with orally transiting puree/thin boluses.  This rumination may increase his episodic aspiration risk.     Subtle throat clear x1 of 7 boluses of thin but no other indications of airway concerns.  Difficult situation as pt with hypernatremia due to inadequate water intake and thickening liquids may prevent aspiration (based on this SLPs viewing of MBS) but also may increase hydration concerns.  Note results of CXR today showing increased RUL opacity and WBC elevation and fever despite pt being on ABX.    Recommend consider only giving pt water with meals to maximize hydration with decreased risk.  Also suspect pt may not allow sitting upright for meals due to pain which will impair airway protection.  Chronic aspiration risk present - mitigating is realistic plan.    No family present to educate.  Will follow up for MD guidance to care plan re: dysphagia for this pt.      HPI HPI: Cody RanBobby Dore is a 52 y.o. 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  Chest x-ray showed bilateral basilar subsegment atelectasis. Found to have multiple nonobstructing right renal calculi measuring up to 10 mm and underwent cystoscopy, right retrograde ureteropyelogram, placement of stent. [facility] he did not aspirate or penetrate, dealyed swallow (thin liq's and appears regular texture recommended). Followed by ST, increased coughing with RN, ST downgraded to puree, continue  thin. CXR 7/29 showed Dense focal areas of airspace filling throughout the lungs bilateral, likely infectious.   MBS showed possible penetration, no aspiration.  CXR 03/28/18 showed increased right infiltrate       SLP Plan  Continue with current plan of care       Recommendations  Diet recommendations: Dysphagia 1 (puree);Thin liquid(room temperature water, consider only water with meals) Liquids provided via: Cup;Straw Medication Administration: Crushed with puree Supervision: Staff to assist with self feeding;Full supervision/cueing for compensatory strategies Compensations: Minimize environmental distractions;Slow rate;Small sips/bites Postural Changes and/or Swallow Maneuvers: Seated upright 90 degrees;Upright 30-60 min after meal(as able)                Oral Care Recommendations: Oral care BID Follow up Recommendations: Skilled Nursing facility SLP Visit Diagnosis: Dysphagia, oropharyngeal phase (R13.12) Plan: Continue with current plan of care       GO                Chales AbrahamsKimball, Demarri Elie Ann 03/28/2018, 11:53 AM  Donavan Burnetamara Kire Ferg, MS [facility] SLP [phone]
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
## 2018-03-29 ENCOUNTER — Encounter: Attending: Internal Medicine | Primary: Internal Medicine

## 2018-03-29 ENCOUNTER — Ambulatory Visit: Admit: 2018-03-29 | Discharge: 2018-03-29 | Payer: MEDICAID | Attending: Family | Primary: Internal Medicine

## 2018-03-29 DIAGNOSIS — L03818 Cellulitis of other sites: Secondary | ICD-10-CM

## 2018-03-29 LAB — BASIC METABOLIC PANEL
ANION GAP: 5 (ref 5–15)
BUN: 19 mg/dL (ref 6–20)
CALCIUM: 7.6 mg/dL — AB (ref 8.9–10.3)
CO2: 30 mmol/L (ref 22–32)
Chloride: 108 mmol/L (ref 98–111)
Creatinine, Ser: 0.8 mg/dL (ref 0.61–1.24)
GFR calc non Af Amer: >60 mL/min (ref >60)
GLUCOSE: 147 mg/dL — AB (ref 70–99)
POTASSIUM: 3.9 mmol/L (ref 3.5–5.1)
Sodium: 143 mmol/L (ref 135–145)

## 2018-03-29 LAB — GLUCOSE, CAPILLARY
GLUCOSE-CAPILLARY: 134 mg/dL — AB (ref 70–99)
GLUCOSE-CAPILLARY: 121 mg/dL — AB (ref 70–99)
GLUCOSE-CAPILLARY: 162 mg/dL — AB (ref 70–99)
Glucose-Capillary: 102 mg/dL — ABNORMAL HIGH (ref 70–99)

## 2018-03-29 LAB — CBC
HEMATOCRIT: 23.7 % — AB (ref 39.0–52.0)
HEMOGLOBIN: 7 g/dL — AB (ref 13.0–17.0)
MCH: 25.8 pg — ABNORMAL LOW (ref 26.0–34.0)
MCHC: 29.5 g/dL — ABNORMAL LOW (ref 30.0–36.0)
MCV: 87.5 fL (ref 78.0–100.0)
Platelets: 357 10*3/uL (ref 150–400)
RBC: 2.71 MIL/uL — AB (ref 4.22–5.81)
RDW: 17.7 % — AB (ref 11.5–15.5)
WBC: 13.5 10*3/uL — AB (ref 4.0–10.5)

## 2018-03-29 LAB — PROCALCITONIN: PROCALCITONIN: 0.21 ng/mL

## 2018-03-29 MED ORDER — ENSURE ENLIVE PO LIQD
237.0000 mL | Freq: Three times a day (TID) | ORAL | Status: DC
  Administered 2018-03-29 – 2018-04-07 (×21): 237 mL via ORAL

## 2018-03-29 MED ORDER — INSULIN ASPART 100 UNIT/ML ~~LOC~~ SOLN
0.0000 [IU] | Freq: Three times a day (TID) | SUBCUTANEOUS | Status: DC
  Administered 2018-04-01 – 2018-04-06 (×2): 1 [IU] via SUBCUTANEOUS
  Administered 2018-04-07: 2 [IU] via SUBCUTANEOUS

## 2018-03-29 MED ORDER — SODIUM CHLORIDE 0.9 % IV SOLN
1.0000 g | INTRAVENOUS | Status: DC
  Administered 2018-03-29 – 2018-04-07 (×10): 1 g via INTRAVENOUS
  Filled 2018-03-29 (×10): qty 10

## 2018-03-29 MED ORDER — INSULIN ASPART 100 UNIT/ML ~~LOC~~ SOLN: 0.0000 [IU] | Freq: Every day | SUBCUTANEOUS | Status: DC

## 2018-03-29 MED ORDER — DOXYCYCLINE HYCLATE 100 MG PO TABS
100 MG | ORAL_TABLET | Freq: Two times a day (BID) | ORAL | 0 refills | Status: AC
Start: 2018-03-29 — End: 2018-04-08

## 2018-03-29 MED ORDER — CEPHALEXIN 500 MG PO CAPS
500 MG | ORAL_CAPSULE | Freq: Three times a day (TID) | ORAL | 0 refills | Status: AC
Start: 2018-03-29 — End: 2018-04-05
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
## 2018-03-29 NOTE — Progress Notes (Signed)
PROGRESS NOTE    Cody [name]  ZOX:[id number] DOB: 06/15/1966 DOA: 03/10/2018 PCP: Shelbie AmmonsHaque, Imran P, MD   Brief Narrative:  52 year old male TBI, spastic left hemiplegia DM 2, hypothyroid, bipolar. Admitted 03/11/2018 increasing lethargy X 48 hours. Admitted with sepsis protocol started-found to have AKI creatinine 2.1 BUN 40 T-max 102.6. WBC 15 CT abdomen pelvis right obstructive uropathy 7X5 UVJ stone. CT head = extensive encephalomalacia slight interval enlargement of the ventricular system compared to 2009 CT. Presumed sepsis from acute pyelonephritis started on Vanco Zosyn given 4 L of fluid and kept on stepdown unit. Urologist was consulted underwent cystoscopy and placement of 6 French X 24 cm JJ stent 7/25. Hospitalization complicated by persisting fever and sepsis.  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  Nephrology was consulted.  Assessment & Plan:   Principal Problem:   Acute pyelonephritis Active Problems:   Depression   Diabetes mellitus without complication (HCC)   Hypertension   Spastic hemiplegia (HCC)   Sepsis (HCC)   AKI (acute kidney injury) (HCC)   Hydroureteronephrosis-right   Ureterovesical junction (UVJ) obstruction   Acute metabolic encephalopathy   Acute respiratory failure with hypoxia (HCC)   Pressure injury of skin   Right kidney stone   Hypernatremia   Aspiration into airway   Palliative care encounter   Diabetes insipidus (HCC)   Sepsis likely multifactorial  Persistent fever Pt with R obsteructive uropathy with 7 x 5 mm R ureterovesical junction stone causing hydroureteronephrosis and perinephric stranding (with multiple nonobstructing calculi measuring up to 10 mm) S/p cystoscopy with R retrograde ureteropyelogram, fluoscopic interpretation, placement of 6 french by 24 cm contour double J stent without tether on 7/25 Pt with recurrent fevers and infectious disease was consulted due to fever without a clear source.   He was  started on unasyn for possible aspiration pneumonia and then transitioned to augmentin.  He has completed course of treatment.  Patient continued to have fevers. Some concern for drug fever?  Blood cx from 7/28 NGTD as well as 8/6 NGTD.   Repeat urine culture from 8/8 with proteus sensitive to ceftriaxone. Repeat blood cx from 8/11 pending  Repeat urine cx from 8/11 again growing proteus  CXR with opacity in RUL thought 2/2 atelectasis and pneumonia Will remove and replace foley catheter Start ceftriaxone for proteus UTI.  Will hold off on treating pneumonia as pt doing well on RA. Discussed with urology, will follow CT.  Severe hypernatremia thought to be secondary to central diabetes insipidus/acute kidney injury Mild uptrend today, 143, follow Patient was seen by nephrology.   He was placed on desmopressin 20 mcg nasal BID and dextrose infusion.   Sodium level has improved.   IVF now d/c'd.   He has been tolerating his diet and water.   He will need continued encouragement to consume water even at skilled nursing facility.  We will continue with current dose of desmopressin intranasally.   Will need periodic monitoring of his sodium levels.   If he develops hypernatremia despite these measures then further discussions will need to be held with family as the only way to provide him free water than would be to place a PEG tube. (will need to discuss with speech as recommending water only with meals)   History of traumatic brain injury with left hemiplegia Patient is stable.  Patient has a shunt.  This was discussed with neurosurgery who did not feel like his symptoms were secondary to shunt malfunction.  Neurologically he appears  to have improved.  He is probably close to his baseline.  Per neurosurgery, consider shunt evaluation after abx.   Chronic aspiration/oropharyngeal dysphagia He is on dysphagia 1 diet.  Speech therapy has seen.  Remains at high risk for recurrent aspirations.   Palliative medicine has been following.   History of bipolar disorder He is on medications which are being continued.  Stage 3 pressure injury  Noted in the right posterior thigh and left buttock area and left heel.  Wound care.  No active infection noted.  Frequent turns.  Diabetes mellitus type 2 Continue long-acting insulin and SSI.  CBGs are reasonably well controlled.  Acute metabolic encephalopathy Most likely secondary to acute infection.  Appears close to baseline.  Acute respiratory failure with hypoxia Secondary to aspiration.  Improved.  No PE noted on CT angiogram done on 7/29.  Normocytic anemia Hemoglobin is low but stable.  No evidence of bleeding.  Essential hypertension Blood pressure is well controlled.  Patient is on metoprolol.  Obstructive uropathy Status post stent placement.   Imaging studies did suggest a retained guidewire as well as proximal aspect of stent may be in renal parenchyma.   Urology is aware of this.   Follow-up with urology after discharge.  He will need to be discharged with Foley catheter.  DVT prophylaxis: lovenox Code Status: full  Family Communication: discussed with sister Disposition Plan: pending improvement   Consultants:   Urology  Neurosurgery  ID  Nephrology  Palliative  Procedures:  On 7/25: Cystoscopy, right retrograde ureteropyelogram, fluoroscopic interpretation, placement of 6 French by 24 cm contour double-J stent without tether  Lower extremity Doppler study  Negative for DVT  Antimicrobials: Anti-infectives (From admission, onward)   Start     Dose/Rate Route Frequency Ordered Stop   03/29/18 1430  cefTRIAXone (ROCEPHIN) 1 g in sodium chloride 0.9 % 100 mL IVPB     1 g 200 mL/hr over 30 Minutes Intravenous Every 24 hours 03/29/18 1430     03/19/18 1400  amoxicillin-clavulanate (AUGMENTIN) 875-125 MG per tablet 1 tablet     1 tablet Oral Every 12 hours 03/19/18 0911 03/23/18 2224   03/18/18  1700  piperacillin-tazobactam (ZOSYN) IVPB 3.375 g  Status:  Discontinued     3.375 g 12.5 mL/hr over 240 Minutes Intravenous Every 8 hours 03/18/18 1343 03/19/18 0910   03/17/18 1400  Ampicillin-Sulbactam (UNASYN) 3 g in sodium chloride 0.9 % 100 mL IVPB  Status:  Discontinued     3 g 200 mL/hr over 30 Minutes Intravenous Every 6 hours 03/17/18 1300 03/18/18 1337   03/14/18 1430  ceFEPIme (MAXIPIME) 2 g in sodium chloride 0.9 % 100 mL IVPB  Status:  Discontinued     2 g 200 mL/hr over 30 Minutes Intravenous Every 24 hours 03/14/18 1136 03/15/18 0956   03/14/18 0100  vancomycin (VANCOCIN) 500 mg in sodium chloride 0.9 % 100 mL IVPB  Status:  Discontinued     500 mg 100 mL/hr over 60 Minutes Intravenous Every 12 hours 03/13/18 1510 03/15/18 0956   03/11/18 2000  vancomycin (VANCOCIN) IVPB 1000 mg/200 mL premix  Status:  Discontinued     1,000 mg 200 mL/hr over 60 Minutes Intravenous Every 24 hours 03/10/18 2053 03/11/18 1342   03/11/18 1400  vancomycin (VANCOCIN) IVPB 750 mg/150 ml premix  Status:  Discontinued     750 mg 150 mL/hr over 60 Minutes Intravenous Every 12 hours 03/11/18 1342 03/13/18 1510   03/11/18 0400  piperacillin-tazobactam (ZOSYN) IVPB  3.375 g  Status:  Discontinued     3.375 g 12.5 mL/hr over 240 Minutes Intravenous Every 8 hours 03/10/18 2053 03/14/18 1136   03/10/18 2030  piperacillin-tazobactam (ZOSYN) IVPB 3.375 g     3.375 g 100 mL/hr over 30 Minutes Intravenous  Once 03/10/18 2023 03/10/18 2139   03/10/18 2030  vancomycin (VANCOCIN) IVPB 1000 mg/200 mL premix     1,000 mg 200 mL/hr over 60 Minutes Intravenous  Once 03/10/18 2023 03/10/18 2208     Subjective: Eating with assistance from tech. Grinding teeth. Awake, but not oriented.  Objective: Vitals:   03/29/18 0732 03/29/18 1015 03/29/18 1135 03/29/18 1619  BP: 132/73 (!) 146/84 115/83 129/80  Pulse: 92 (!) 102 (!) 101 (!) 107  Resp: (!) 21  (!) 26 (!) 27  Temp: 98.5 F (36.9 C)  99.3 F (37.4 C)  98.8 F (37.1 C)  TempSrc: Axillary  Axillary Axillary  SpO2: 98%  99% 99%  Weight:      Height:        Intake/Output Summary (Last 24 hours) at 03/29/2018 1728 Last data filed at 03/29/2018 1245 Gross per 24 hour  Intake 240 ml  Output -  Net 240 ml   Filed Weights   03/27/18 0438 03/28/18 0458 03/29/18 0333  Weight: 61.7 kg 62.1 kg 54.9 kg    Examination:  General exam: Appears calm and comfortable  Respiratory system: Clear to auscultation. Respiratory effort normal. Cardiovascular system: S1 & S2 heard, RRR.  Gastrointestinal system: Abdomen is nondistended, soft and nontender. Central nervous system: Alert and disoriented.  Extremities: no LEE Skin: sacral decub not evaluated today Psychiatry: Judgement and insight appear normal. Mood & affect appropriate.     Data Reviewed: I have personally reviewed following labs and imaging studies  CBC: Recent Labs  Lab 03/23/18 0528 03/25/18 0447 03/27/18 0507 03/28/18 0407 03/29/18 0340  WBC 16.0* 12.3* 11.2* 11.5* 13.5*  NEUTROABS 12.3*  --   --   --   --   HGB 8.2* 7.3* 8.1* 7.4* 7.0*  HCT 27.2* 25.3* 27.1* 25.1* 23.7*  MCV 87.2 89.4 87.1 88.4 87.5  PLT 337 322 318 316 357   Basic Metabolic Panel: Recent Labs  Lab 03/25/18 0447  03/26/18 2105 03/27/18 0507 03/27/18 1333 03/28/18 0407 03/29/18 0340  NA 150*   < > 141 140 140 141 143  K 3.8  --   --  4.2  --  3.7 3.9  CL 116*  --   --  105  --  106 108  CO2 27  --   --  27  --  27 30  GLUCOSE 137*  --   --  98  --  131* 147*  BUN 24*  --   --  15  --  19 19  CREATININE 0.84  --   --  0.72  --  0.83 0.80  CALCIUM 7.4*  --   --  7.3*  --  7.5* 7.6*   < > = values in this interval not displayed.   GFR: Estimated Creatinine Clearance: 84.8 mL/min (by C-G formula based on SCr of 0.8 mg/dL). Liver Function Tests: Recent Labs  Lab 03/27/18 0507  AST 27  ALT 19  ALKPHOS 96  BILITOT 0.6  PROT 8.4*  ALBUMIN 1.5*   No results for input(s): LIPASE,  AMYLASE in the last 168 hours. No results for input(s): AMMONIA in the last 168 hours. Coagulation Profile: No results for input(s): INR, PROTIME in the  last 168 hours. Cardiac Enzymes: No results for input(s): CKTOTAL, CKMB, CKMBINDEX, TROPONINI in the last 168 hours. BNP (last 3 results) No results for input(s): PROBNP in the last 8760 hours. HbA1C: No results for input(s): HGBA1C in the last 72 hours. CBG: Recent Labs  Lab 03/28/18 1620 03/28/18 2115 03/29/18 0731 03/29/18 1131 03/29/18 1616  GLUCAP 117* 139* 102* 134* 121*   Lipid Profile: No results for input(s): CHOL, HDL, LDLCALC, TRIG, CHOLHDL, LDLDIRECT in the last 72 hours. Thyroid Function Tests: No results for input(s): TSH, T4TOTAL, FREET4, T3FREE, THYROIDAB in the last 72 hours. Anemia Panel: No results for input(s): VITAMINB12, FOLATE, FERRITIN, TIBC, IRON, RETICCTPCT in the last 72 hours. Sepsis Labs: Recent Labs  Lab 03/29/18 0340  PROCALCITON 0.21    Recent Results (from the past 240 hour(s))  Culture, blood (routine x 2)     Status: None   Collection Time: 03/23/18 12:35 AM  Result Value Ref Range Status   Specimen Description BLOOD LEFT WRIST  Final   Special Requests   Final    BOTTLES DRAWN AEROBIC ONLY Blood Culture results may not be optimal due to an inadequate volume of blood received in culture bottles   Culture   Final    NO GROWTH 5 DAYS Performed at [facility], [address] [place], [place] [address]    Report Status 03/28/2018 FINAL  Final  Culture, blood (routine x 2)     Status: None   Collection Time: 03/23/18 12:40 AM  Result Value Ref Range Status   Specimen Description BLOOD LEFT HAND  Final   Special Requests   Final    BOTTLES DRAWN AEROBIC ONLY Blood Culture results may not be optimal due to an inadequate volume of blood received in culture bottles   Culture   Final    NO GROWTH 5 DAYS Performed at [facility], [address] [place], [place] [address]      Report Status 03/28/2018 FINAL  Final  Culture, Urine     Status: Abnormal   Collection Time: 03/25/18  9:30 AM  Result Value Ref Range Status   Specimen Description URINE, RANDOM  Final   Special Requests   Final    NONE Performed at [facility], [address] [place], [place] [address]    Culture >=100,000 COLONIES/mL PROTEUS MIRABILIS (A)  Final   Report Status 03/28/2018 FINAL  Final   Organism ID, Bacteria PROTEUS MIRABILIS (A)  Final      Susceptibility   Proteus mirabilis - MIC*    AMPICILLIN >=32 RESISTANT Resistant     CEFAZOLIN 8 SENSITIVE Sensitive     CEFTRIAXONE <=1 SENSITIVE Sensitive     CIPROFLOXACIN >=4 RESISTANT Resistant     GENTAMICIN <=1 SENSITIVE Sensitive     IMIPENEM 2 SENSITIVE Sensitive     NITROFURANTOIN 256 RESISTANT Resistant     TRIMETH/SULFA >=320 RESISTANT Resistant     AMPICILLIN/SULBACTAM 16 INTERMEDIATE Intermediate     PIP/TAZO <=4 SENSITIVE Sensitive     * >=100,000 COLONIES/mL PROTEUS MIRABILIS  Culture, blood (Routine X 2) w Reflex to ID Panel     Status: None (Preliminary result)   Collection Time: 03/28/18 10:40 AM  Result Value Ref Range Status   Specimen Description BLOOD LEFT HAND  Final   Special Requests   Final    BOTTLES DRAWN AEROBIC ONLY Blood Culture results may not be optimal due to an inadequate volume of blood received in culture bottles  Culture   Final    NO GROWTH 1 DAY Performed at [facility], [address] [place], [place] [address]    Report Status PENDING  Incomplete  Culture, blood (Routine X 2) w Reflex to ID Panel     Status: None (Preliminary result)   Collection Time: 03/28/18 10:48 AM  Result Value Ref Range Status   Specimen Description BLOOD LEFT HAND  Final   Special Requests   Final    BOTTLES DRAWN AEROBIC ONLY Blood Culture adequate volume   Culture   Final    NO GROWTH 1 DAY Performed at [facility], [address] [place], [place] [address]    Report Status PENDING   Incomplete  Culture, Urine     Status: Abnormal (Preliminary result)   Collection Time: 03/28/18 11:14 AM  Result Value Ref Range Status   Specimen Description URINE, CATHETERIZED  Final   Special Requests   Final    NONE Performed at [facility], [address] [place], [place] [address]    Culture >=100,000 COLONIES/mL PROTEUS MIRABILIS (A)  Final   Report Status PENDING  Incomplete         Radiology Studies: Dg Chest Port 1 View  Result Date: 03/28/2018 CLINICAL DATA:  Fever. EXAM: PORTABLE CHEST 1 VIEW COMPARISON:  03/22/2018 and older exams. FINDINGS: Lung volumes remain low. There is opacity in the right upper lobe associated with elevation of the minor fissure. This is likely combination of residual pneumonia and atelectasis. It has increased when compared to the most recent prior exam. There is mild streaky opacity at the lung bases that is likely due to atelectasis. Remainder of the lungs is clear. No pulmonary edema. No convincing pleural effusion.  No pneumothorax. Heart normal in size. IMPRESSION: 1. Increased opacity in the right upper lobe when compared to the most recent prior study. This is likely combination of atelectasis and pneumonia. Mid lung hazy opacities noted on the prior study have improved. 2. No other change. Mild basilar atelectasis is similar to the prior study. Electronically Signed   By: Amie Portlandavid  Ormond M.D.   On: 03/28/2018 10:14        Scheduled Meds: . baclofen  10 mg Oral TID  . clonazePAM  0.5 mg Oral Q24H  . clonazePAM  1 mg Oral BID  . collagenase   Topical Daily  . desmopressin  20 mcg Nasal BID  . divalproex  500 mg Oral TID  . enoxaparin (LOVENOX) injection  40 mg Subcutaneous Q24H  . feeding supplement (ENSURE ENLIVE)  237 mL Oral TID BM  . feeding supplement (PRO-STAT SUGAR FREE 64)  30 mL Oral BID BM  . insulin aspart  0-9 Units Subcutaneous TID WC  . insulin detemir  10 Units Subcutaneous Daily  . mouth rinse  15 mL Mouth  Rinse BID  . metoprolol tartrate  50 mg Oral QHS  . metoprolol tartrate  75 mg Oral Daily  . multivitamin with minerals  1 tablet Oral Daily  . polyethylene glycol  17 g Oral Daily  . QUEtiapine  125 mg Oral QHS  . QUEtiapine  150 mg Oral Daily  . selenium sulfide  1 application Topical Q T,Th,Sat-1800  . sertraline  100 mg Oral Daily   Continuous Infusions: . sodium chloride Stopped (03/24/18 1925)  . cefTRIAXone (ROCEPHIN)  IV 1 g (03/29/18 1617)  . magnesium sulfate 1 - 4 g bolus IVPB Stopped (03/19/18 1513)  LOS: 18 days    Time spent: over 30 min MDM moderate with recurrent fevers, proteus UTI, starting IV abx   Lacretia Nicks, MD [facility] Pager [phone]  If 7PM-7AM, please contact night-coverage [email or web address] Password [facility] 03/29/2018, 5:28 PM

## 2018-03-29 NOTE — Patient Instructions (Signed)
Patient Education        Cellulitis: Care Instructions  Your Care Instructions    Cellulitis is a skin infection caused by bacteria, most often strep or staph. It often occurs after a break in the skin from a scrape, cut, bite, or puncture, or after a rash.  Cellulitis may be treated without doing tests to find out what caused it. But your doctor may do tests, if needed, to look for a specific bacteria, like methicillin-resistant Staphylococcus aureus (MRSA).  The doctor has checked you carefully, but problems can develop later. If you notice any problems or new symptoms, get medical treatment right away.  Follow-up care is a key part of your treatment and safety. Be sure to make and go to all appointments, and call your doctor if you are having problems. It's also a good idea to know your test results and keep a list of the medicines you take.  How can you care for yourself at home?  ?? Take your antibiotics as directed. Do not stop taking them just because you feel better. You need to take the full course of antibiotics.  ?? Prop up the infected area on pillows to reduce pain and swelling. Try to keep the area above the level of your heart as often as you can.  ?? If your doctor told you how to care for your wound, follow your doctor's instructions. If you did not get instructions, follow this general advice:  ? Wash the wound with clean water 2 times a day. Don't use hydrogen peroxide or alcohol, which can slow healing.  ? You may cover the wound with a thin layer of petroleum jelly, such as Vaseline, and a nonstick bandage.  ? Apply more petroleum jelly and replace the bandage as needed.  ?? Be safe with medicines. Take pain medicines exactly as directed.  ? If the doctor gave you a prescription medicine for pain, take it as prescribed.  ? If you are not taking a prescription pain medicine, ask your doctor if you can take an over-the-counter medicine.  To prevent cellulitis in the future  ?? Try to prevent cuts,  scrapes, or other injuries to your skin. Cellulitis most often occurs where there is a break in the skin.  ?? If you get a scrape, cut, mild burn, or bite, wash the wound with clean water as soon as you can to help avoid infection. Don't use hydrogen peroxide or alcohol, which can slow healing.  ?? If you have swelling in your legs (edema), support stockings and good skin care may help prevent leg sores and cellulitis.  ?? Take care of your feet, especially if you have diabetes or other conditions that increase the risk of infection. Wear shoes and socks. Do not go barefoot. If you have athlete's foot or other skin problems on your feet, talk to your doctor about how to treat them.  When should you call for help?  Call your doctor now or seek immediate medical care if:  ?? ?? You have signs that your infection is getting worse, such as:  ? Increased pain, swelling, warmth, or redness.  ? Red streaks leading from the area.  ? Pus draining from the area.  ? A fever.   ?? ?? You get a rash.   ??Watch closely for changes in your health, and be sure to contact your doctor if:  ?? ?? You do not get better as expected.   Where can you learn more?    Go to [email or web address] and sign in to your MyChart account. Enter [phone] in the Search Health Information box to learn more about "Cellulitis: Care Instructions."     If you do not have an account, please click on the "Sign Up Now" link.  Current as of: November 16, 2017  Content Version: 12.1  ?? 2006-2019 [employer]. Care instructions adapted under license by [facility]. If you have questions about a medical condition or this instruction, always ask your healthcare professional. [employer] disclaims any warranty or liability for your use of this information.         Patient Education        MRSA: Care Instructions  Your Care Instructions    MRSA stands for methicillin-resistant Staphylococcus aureus. It is a type of bacteria that can cause a  staph infection. But it cannot be killed by the antibiotic methicillin and some other antibiotics. This sometimes makes it harder to treat.  The bacteria are widespread on skin and in the nose. MRSA can cause infections of the skin, heart, blood, and bones. The bacteria can spread quickly in the body and cause serious problems. MRSA can also be spread from person to person.  Depending on how serious your infection is, the doctor may drain your wound and you may get antibiotics through a small tube placed in a vein (IV). Your doctor may also give you an antibiotic ointment to use on sores or in your nose.  Follow-up care is a key part of your treatment and safety. Be sure to make and go to all appointments, and call your doctor if you are having problems. It's also a good idea to know your test results and keep a list of the medicines you take.  How can you care for yourself at home?  ?? Take your antibiotics as directed. Do not stop taking them just because you feel better. You need to take the full course of antibiotics.  ?? Keep any cuts or other wounds covered while they heal.  ?? Wash your hands often, especially after you touch elastic bandages or other dressings over a wound. This can keep the bacteria from spreading. Wrap bandages in a plastic bag before you throw them away.  ?? Do not share towels, washcloths, razors, clothing, or other items that touched your wound or bandage. Wash your sheets, towels, and clothes with warm water and detergent. Dry them in a hot dryer, if possible.  ?? Keep shared areas clean by wiping down surfaces (such as countertops, doorknobs, and light switches) with a disinfectant.  When should you call for help?  Call your doctor now or seek immediate medical care if:  ?? ?? You have worse symptoms of infection, such as:  ? Increased pain, swelling, warmth, or redness.  ? Red streaks leading from the area.  ? Pus draining from the area.  ? A fever.   ??Watch closely for changes in your  health, and be sure to contact your doctor if:  ?? ?? You do not get better as expected.   Where can you learn more?  Go to [email or web address] and sign in to your MyChart account. Enter [id number] in the Search Health Information box to learn more about "MRSA: Care Instructions."     If you do not have an account, please click on the "Sign Up Now" link.  Current as of: March 16, 2017  Content Version: 12.1  ?? 2006-2019 [employer]. Care instructions adapted under license by [facility].  If you have questions about a medical condition or this instruction, always ask your healthcare professional. [employer] disclaims any warranty or liability for your use of this information.         Patient Education        Learning About Preventing MRSA Infection  What is a MRSA infection?    MRSA stands for methicillin-resistant Staphylococcus aureus. It is a type of bacteria that can cause a staph infection.  Staph bacteria normally live on your skin and in your nose, usually without causing problems. Sometimes the bacteria cause infection. Usually you can treat this infection with antibiotics.  But MRSA infections are harder to treat than other staph infections. This is because antibiotics may not be able to kill MRSA. For some people, especially those who are weak or ill, MRSA infections can become serious.  MRSA can spread from person to person. It is commonly spread from the hands of someone who has MRSA. This could be anyone in a health care setting or in the community.  MRSA is more likely to develop when antibiotics are used too often or aren't used the right way. Over time, bacteria can change so that these antibiotics no longer work well.  How can you prevent MRSA infection?  Practice good hygiene  ?? Wash your hands often and thoroughly with soap and clean, running water. You can also use an alcohol-based hand sanitizer. Hand-washing is the best way to avoid spreading germs.  ?? Keep  cuts and scrapes clean and covered with a bandage. Avoid contact with other people's wounds or bandages.  ?? Don't share personal items such as towels or razors.  ?? If you are in the hospital, remind doctors and nurses to wash their hands before they touch you.  Use antibiotics wisely  ?? Always ask your doctor if antibiotics are the best treatment. They can help treat bacterial infections, but they can't cure viral infections. Don't pressure your doctor to prescribe antibiotics when they won't help you get better.  ?? If your doctor prescribed antibiotics, take them as directed. Do not stop taking them just because you feel better. You need to take the full course of antibiotics. Using only part of the medicine may cause antibiotic-resistant bacteria to develop.  ?? Do not save any antibiotics. Don't use antibiotics that were prescribed for someone else.  What are the symptoms?  Symptoms of a MRSA infection depend on where the infection is:  ?? If the infection is in a wound, that area of your skin may be red or tender.  ?? If the infection is in the skin, you may have boils or abscesses. It may look like you have been bitten by a spider or insect.  How is it diagnosed?  The doctor will take a sample of your infected wound or take a blood or urine sample. The sample is tested to see if antibiotics can kill the bacteria. This test may take several days.  You may also be tested to see if you are a MRSA carrier. A carrier is a person who has the bacteria on his or her skin but who isn't sick. The doctor will take a swab from the inside of your nose for this test.  How is MRSA treated?  Your doctor may:  ?? Drain your wound.  ?? Give you antibiotics as pills or through a needle put in your vein (IV).  ?? Give you an ointment to put on your skin or inside your  nose.  ?? Have you wash your skin daily with an antiseptic soap.  You may have to stay in the hospital for treatment. In the hospital, you may be kept apart from others to  reduce the chances of spreading the bacteria.  Follow-up care is a key part of your treatment and safety. Be sure to make and go to all appointments, and call your doctor if you are having problems. It's also a good idea to know your test results and keep a list of the medicines you take.  Where can you learn more?  Go to [email or web address] and sign in to your MyChart account. Enter D218 in the Search Health Information box to learn more about "Learning About Preventing MRSA Infection."     If you do not have an account, please click on the "Sign Up Now" link.  Current as of: March 16, 2017  Content Version: 12.1  ?? 2006-2019 [employer]. Care instructions adapted under license by [facility]. If you have questions about a medical condition or this instruction, always ask your healthcare professional. [employer] disclaims any warranty or liability for your use of this information.       Epson salts and warm water soaks 4 x daily for 5 days

## 2018-03-29 NOTE — Progress Notes (Signed)
Subjective:     Patient: Mason Garza is a 52 y.o. male who lives at [facility] presents with redness to inner leg        HPI Hx of MRSA, IV drug user. Complaining of over last few days pinkness has increased along with swelling. "want the same medications as before"    Review of Systems   Skin: Positive for color change (inner left leg).   All other systems reviewed and are negative.       Allergies   Allergen Reactions   ??? Penicillins Hives     Current Outpatient Medications on File Prior to Visit   Medication Sig Dispense Refill   ??? naproxen (NAPROSYN) 500 MG tablet Take 1 tablet by mouth 2 times daily as needed for Pain 14 tablet 0   ??? glucose monitoring kit (FREESTYLE) monitoring kit 1 kit by Does not apply route daily Dispense as covered by insurance 1 kit 0   ??? blood glucose monitor strips Test 3 times a day & as needed for symptoms of irregular blood glucose.dispense as covered by insurance 100 strip 5   ??? Lancets MISC Dispense lancet device DISPENSE AS COVERED BY INSURANCE 1 each 3   ??? Lancets MISC 1 each by Does not apply route 3 times daily (before meals) DISPENSE AS COVERED BY INSURANCE 100 each 11   ??? Alcohol Swabs (ALCOHOL PADS) 70 % PADS 1 Package by Does not apply route 3 times daily (before meals) 100 each 5     No current facility-administered medications on file prior to visit.       Past Medical History:   Diagnosis Date   ??? 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 H/O echocardiogram 05/12/11    EF62%, mild LAE   ??? Neuropathy    ??? 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 Psychiatric problem    ??? Type II or unspecified type diabetes mellitus without mention of complication, not stated as uncontrolled       Social History     Tobacco Use   ??? Smoking status: Current Every Day Smoker     Packs/day: 1.00     Years: 30.00     Pack years: 30.00     Types: Cigarettes   ??? Smokeless tobacco: Never Used   ??? Tobacco comment: nicotene patch requested   Substance Use  Topics   ??? Alcohol use: No     Alcohol/week: 0.0 standard drinks     Comment: 6 pack per yr now; former 6-12 pk per day for 10 yrs          Objective:     BP 102/62 (Site: Right Upper Arm, Position: Sitting, Cuff Size: Medium Adult)    Pulse 66    Temp 98 ??F (36.7 ??C)    Ht 5' 11"  (1.803 m)    Wt 168 lb (76.2 kg)    SpO2 98%    BMI 23.43 kg/m??     Physical Exam   Constitutional: Vital signs are normal. He appears well-developed and well-nourished. He is cooperative.  Non-toxic appearance. He does not have a sickly appearance. He does not appear ill. No distress.   Neurological: He is alert.   Skin: Lesion noted. There is erythema.        Checked with [facility] at [facility] and soaks to leg are possible.   Taught how to care for wound  and how to accomplish soaks 4x day.    Assessment      1. Cellulitis of other specified site    2. At risk for methicillin resistant Staphylococcus aureus infection         Plan      1. Cellulitis of other specified site  - doxycycline hyclate (VIBRA-TABS) 100 MG tablet; Take 1 tablet by mouth 2 times daily for 10 days  Dispense: 20 tablet; Refill: 0  - cephALEXin (KEFLEX) 500 MG capsule; Take 1 capsule by mouth 3 times daily for 7 days  Dispense: 21 capsule; Refill: 0    2. At risk for methicillin resistant Staphylococcus aureus infection  - doxycycline hyclate (VIBRA-TABS) 100 MG tablet; Take 1 tablet by mouth 2 times daily for 10 days  Dispense: 20 tablet; Refill: 0  - cephALEXin (KEFLEX) 500 MG capsule; Take 1 capsule by mouth 3 times daily for 7 days  Dispense: 21 capsule; Refill: 0      Vladimir Creeks, APRN - CNP  03/29/18  4:09 PM        If symptoms do not improve, worsen, or new symptoms develop, see PCP for further evaluation.

## 2018-03-30 ENCOUNTER — Inpatient Hospital Stay (HOSPITAL_COMMUNITY): Payer: Medicare Other

## 2018-03-30 ENCOUNTER — Encounter (HOSPITAL_COMMUNITY): Payer: Self-pay | Admitting: Radiology

## 2018-03-30 LAB — PROCALCITONIN: PROCALCITONIN: 0.34 ng/mL

## 2018-03-30 LAB — BASIC METABOLIC PANEL
Anion gap: 10 (ref 5–15)
BUN: 23 mg/dL — AB (ref 6–20)
CALCIUM: 7.8 mg/dL — AB (ref 8.9–10.3)
CO2: 27 mmol/L (ref 22–32)
CREATININE: 0.93 mg/dL (ref 0.61–1.24)
Chloride: 109 mmol/L (ref 98–111)
GFR calc Af Amer: >60 mL/min (ref >60)
GFR calc non Af Amer: >60 mL/min (ref >60)
GLUCOSE: 98 mg/dL (ref 70–99)
Potassium: 4.8 mmol/L (ref 3.5–5.1)
Sodium: 146 mmol/L — ABNORMAL HIGH (ref 135–145)

## 2018-03-30 LAB — CBC
HEMATOCRIT: 28 % — AB (ref 39.0–52.0)
Hemoglobin: 7.8 g/dL — ABNORMAL LOW (ref 13.0–17.0)
MCH: 26.3 pg (ref 26.0–34.0)
MCHC: 27.9 g/dL — AB (ref 30.0–36.0)
MCV: 94.3 fL (ref 78.0–100.0)
PLATELETS: 252 10*3/uL (ref 150–400)
RBC: 2.97 MIL/uL — ABNORMAL LOW (ref 4.22–5.81)
RDW: 18.4 % — AB (ref 11.5–15.5)
WBC: 13.4 10*3/uL — ABNORMAL HIGH (ref 4.0–10.5)

## 2018-03-30 LAB — URINE CULTURE

## 2018-03-30 LAB — GLUCOSE, CAPILLARY
Glucose-Capillary: 89 mg/dL (ref 70–99)
Glucose-Capillary: 100 mg/dL — ABNORMAL HIGH (ref 70–99)
Glucose-Capillary: 92 mg/dL (ref 70–99)
Glucose-Capillary: 126 mg/dL — ABNORMAL HIGH (ref 70–99)

## 2018-03-30 LAB — MAGNESIUM: Magnesium: 1.9 mg/dL (ref 1.7–2.4)

## 2018-03-30 MED ORDER — FREE WATER
250.0000 mL | Freq: Three times a day (TID) | Status: DC
  Administered 2018-03-31 – 2018-04-02 (×8): 250 mL via ORAL

## 2018-03-30 MED ORDER — IOHEXOL 300 MG/ML  SOLN
100.0000 mL | Freq: Once | INTRAMUSCULAR | Status: AC | PRN
  Administered 2018-03-30: 100 mL via INTRAVENOUS

## 2018-03-30 NOTE — Progress Notes (Addendum)
PROGRESS NOTE    Cody [name]  ZOX:[id number] DOB: 16-May-1966 DOA: 03/10/2018 PCP: [name], MD   Brief Narrative:  52 year old male TBI, spastic left hemiplegia DM 2, hypothyroid, bipolar. Admitted 03/11/2018 increasing lethargy X 48 hours. Admitted with sepsis protocol started-found to have AKI creatinine 2.1 BUN 40 T-max 102.6. WBC 15 CT abdomen pelvis right obstructive uropathy 7X5 UVJ stone. CT head = extensive encephalomalacia slight interval enlargement of the ventricular system compared to 2009 CT. Presumed sepsis from acute pyelonephritis started on Vanco Zosyn given 4 L of fluid and kept on stepdown unit. Urologist was consulted underwent cystoscopy and placement of 6 French X 24 cm JJ stent 7/25. Hospitalization complicated by persisting fever and sepsis.  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  Nephrology was consulted.  Assessment & Plan:   Principal Problem:   Acute pyelonephritis Active Problems:   Depression   Diabetes mellitus without complication (HCC)   Hypertension   Spastic hemiplegia (HCC)   Sepsis (HCC)   AKI (acute kidney injury) (HCC)   Hydroureteronephrosis-right   Ureterovesical junction (UVJ) obstruction   Acute metabolic encephalopathy   Acute respiratory failure with hypoxia (HCC)   Pressure injury of skin   Right kidney stone   Hypernatremia   Aspiration into airway   Palliative care encounter   Diabetes insipidus (HCC)   Sepsis likely multifactorial  Persistent fever Pt with R obsteructive uropathy with 7 x 5 mm R ureterovesical junction stone causing hydroureteronephrosis and perinephric stranding (with multiple nonobstructing calculi measuring up to 10 mm) S/p cystoscopy with R retrograde ureteropyelogram, fluoscopic interpretation, placement of 6 french by 24 cm contour double J stent without tether on 7/25 Pt with recurrent fevers and infectious disease was consulted due to fever without a clear source.   He was  started on unasyn for possible aspiration pneumonia and then transitioned to augmentin.  He has completed course of treatment.  Patient continued to have fevers. Some concern for drug fever?  Blood cx from 7/28 NGTD as well as 8/6 NGTD.   Repeat urine culture from 8/8 with proteus sensitive to ceftriaxone. Repeat blood cx from 8/11 NGTD x2 days  Repeat urine cx from 8/11 again growing proteus  CXR with opacity in RUL thought 2/2 atelectasis and pneumonia Will remove and replace foley catheter Start ceftriaxone for proteus UTI.  Will hold off on treating pneumonia as pt doing well on RA. Discussed with urology, will follow CT (pending at this time - pt may need perc tube) Recurrent fevers overnight, follow repeat CXR in addition to CT (he continues to do well on room air), continue ceftriaxone Follow PCT, rising  Severe hypernatremia thought to be secondary to central diabetes insipidus/acute kidney injury Worsening today Patient was seen by nephrology.   He was placed on desmopressin 20 mcg nasal BID and dextrose infusion.   Sodium level has improved and is now worsening off IV fluids  He has been tolerating his diet and water.   Discussed with nursing, encourage PO free water intake, at least 250 cc PO three times in between meals (and at least 250 cc 3 times day with meals) He will need continued encouragement to consume water even at skilled nursing facility.  We will continue with current dose of desmopressin intranasally.   Will need periodic monitoring of his sodium levels.   If he develops hypernatremia despite these measures then further discussions will need to be held with family as the only way to provide him free  water than would be to place a PEG tube (discussed with sister this possibility today)  History of traumatic brain injury with left hemiplegia Patient is stable.  Patient has a shunt.  This was discussed with neurosurgery who did not feel like his symptoms were  secondary to shunt malfunction.  Neurologically he appears to have improved.  He is probably close to his baseline.  Per neurosurgery, consider shunt evaluation after abx.   Chronic aspiration/oropharyngeal dysphagia He is on dysphagia 1 diet.  Speech therapy has seen.  Remains at high risk for recurrent aspirations.  Palliative medicine has been following.   History of bipolar disorder He is on medications which are being continued.  Stage 3 pressure injury  Noted in the right posterior thigh and left buttock area and left heel.  Wound care.  No active infection appreciated.  Frequent turns.  Diabetes mellitus type 2 Continue long-acting insulin and SSI.  CBGs are reasonably well controlled.  Acute metabolic encephalopathy Most likely secondary to acute infection.  Appears close to baseline.  Acute respiratory failure with hypoxia Secondary to aspiration.  Improved.  No PE noted on CT angiogram done on 7/29.  Normocytic anemia Hemoglobin is low but stable.  No evidence of bleeding.  Essential hypertension Blood pressure is well controlled.  Patient is on metoprolol.  Obstructive uropathy Status post stent placement.   Imaging studies did suggest a retained guidewire as well as proximal aspect of stent may be in renal parenchyma.   Urology is aware of this.   Follow-up with urology after discharge.  He will need to be discharged with Foley catheter (discussed with urology who noted we could try trial of void while he's here - will wait for CT scan first).  Nonsustained Vtach: follow, no echo to eval EF.  Will continue to monitor.    DVT prophylaxis: lovenox Code Status: full  Family Communication: discussed with sister 8/13 Disposition Plan: pending improvement   Consultants:   Urology  Neurosurgery  ID  Nephrology  Palliative  Procedures:  On 7/25: Cystoscopy, right retrograde ureteropyelogram, fluoroscopic interpretation, placement of 6 French by 24 cm  contour double-J stent without tether  Lower extremity Doppler study  Negative for DVT  Antimicrobials: Anti-infectives (From admission, onward)   Start     Dose/Rate Route Frequency Ordered Stop   03/29/18 1430  cefTRIAXone (ROCEPHIN) 1 g in sodium chloride 0.9 % 100 mL IVPB     1 g 200 mL/hr over 30 Minutes Intravenous Every 24 hours 03/29/18 1430     03/19/18 1400  amoxicillin-clavulanate (AUGMENTIN) 875-125 MG per tablet 1 tablet     1 tablet Oral Every 12 hours 03/19/18 0911 03/23/18 2224   03/18/18 1700  piperacillin-tazobactam (ZOSYN) IVPB 3.375 g  Status:  Discontinued     3.375 g 12.5 mL/hr over 240 Minutes Intravenous Every 8 hours 03/18/18 1343 03/19/18 0910   03/17/18 1400  Ampicillin-Sulbactam (UNASYN) 3 g in sodium chloride 0.9 % 100 mL IVPB  Status:  Discontinued     3 g 200 mL/hr over 30 Minutes Intravenous Every 6 hours 03/17/18 1300 03/18/18 1337   03/14/18 1430  ceFEPIme (MAXIPIME) 2 g in sodium chloride 0.9 % 100 mL IVPB  Status:  Discontinued     2 g 200 mL/hr over 30 Minutes Intravenous Every 24 hours 03/14/18 1136 03/15/18 0956   03/14/18 0100  vancomycin (VANCOCIN) 500 mg in sodium chloride 0.9 % 100 mL IVPB  Status:  Discontinued     500  mg 100 mL/hr over 60 Minutes Intravenous Every 12 hours 03/13/18 1510 03/15/18 0956   03/11/18 2000  vancomycin (VANCOCIN) IVPB 1000 mg/200 mL premix  Status:  Discontinued     1,000 mg 200 mL/hr over 60 Minutes Intravenous Every 24 hours 03/10/18 2053 03/11/18 1342   03/11/18 1400  vancomycin (VANCOCIN) IVPB 750 mg/150 ml premix  Status:  Discontinued     750 mg 150 mL/hr over 60 Minutes Intravenous Every 12 hours 03/11/18 1342 03/13/18 1510   03/11/18 0400  piperacillin-tazobactam (ZOSYN) IVPB 3.375 g  Status:  Discontinued     3.375 g 12.5 mL/hr over 240 Minutes Intravenous Every 8 hours 03/10/18 2053 03/14/18 1136   03/10/18 2030  piperacillin-tazobactam (ZOSYN) IVPB 3.375 g     3.375 g 100 mL/hr over 30 Minutes  Intravenous  Once 03/10/18 2023 03/10/18 2139   03/10/18 2030  vancomycin (VANCOCIN) IVPB 1000 mg/200 mL premix     1,000 mg 200 mL/hr over 60 Minutes Intravenous  Once 03/10/18 2023 03/10/18 2208     Subjective: Sleeping. Wakes when turned.  Objective: Vitals:   03/30/18 0616 03/30/18 0750 03/30/18 0900 03/30/18 1124  BP: 97/63 109/67  117/71  Pulse: 86 87  100  Resp: 18 19  (!) 31  Temp: 98.6 F (37 C) 97.7 F (36.5 C) 98.2 F (36.8 C) 98.3 F (36.8 C)  TempSrc: Axillary Axillary Rectal Axillary  SpO2: 99% 98%  97%  Weight: 58.1 kg     Height:        Intake/Output Summary (Last 24 hours) at 03/30/2018 1607 Last data filed at 03/30/2018 1300 Gross per 24 hour  Intake 940 ml  Output 2000 ml  Net -1060 ml   Filed Weights   03/28/18 0458 03/29/18 0333 03/30/18 0616  Weight: 62.1 kg 54.9 kg 58.1 kg    Examination:  General: No acute distress. Cardiovascular: Heart sounds show a regular rate, and rhythm.  Lungs: Clear to auscultation bilaterally with good air movement.  Abdomen: Soft, nontender, nondistended  Neurological: Alert, disoriented.  . Skin: healing ulcer to L heal, ulcer to L thigh/buttock (does not appear infected) Extremities: No clubbing or cyanosis. No edema.  Contractures.   Data Reviewed: I have personally reviewed following labs and imaging studies  CBC: Recent Labs  Lab 03/25/18 0447 03/27/18 0507 03/28/18 0407 03/29/18 0340 03/30/18 0311  WBC 12.3* 11.2* 11.5* 13.5* 13.4*  HGB 7.3* 8.1* 7.4* 7.0* 7.8*  HCT 25.3* 27.1* 25.1* 23.7* 28.0*  MCV 89.4 87.1 88.4 87.5 94.3  PLT 322 318 316 357 252   Basic Metabolic Panel: Recent Labs  Lab 03/25/18 0447  03/27/18 0507 03/27/18 1333 03/28/18 0407 03/29/18 0340 03/30/18 0311  NA 150*   < > 140 140 141 143 146*  K 3.8  --  4.2  --  3.7 3.9 4.8  CL 116*  --  105  --  106 108 109  CO2 27  --  27  --  27 30 27   GLUCOSE 137*  --  98  --  131* 147* 98  BUN 24*  --  15  --  19 19 23*    CREATININE 0.84  --  0.72  --  0.83 0.80 0.93  CALCIUM 7.4*  --  7.3*  --  7.5* 7.6* 7.8*  MG  --   --   --   --   --   --  1.9   < > = values in this interval not displayed.  GFR: Estimated Creatinine Clearance: 77.2 mL/min (by C-G formula based on SCr of 0.93 mg/dL). Liver Function Tests: Recent Labs  Lab 03/27/18 0507  AST 27  ALT 19  ALKPHOS 96  BILITOT 0.6  PROT 8.4*  ALBUMIN 1.5*   No results for input(s): LIPASE, AMYLASE in the last 168 hours. No results for input(s): AMMONIA in the last 168 hours. Coagulation Profile: No results for input(s): INR, PROTIME in the last 168 hours. Cardiac Enzymes: No results for input(s): CKTOTAL, CKMB, CKMBINDEX, TROPONINI in the last 168 hours. BNP (last 3 results) No results for input(s): PROBNP in the last 8760 hours. HbA1C: No results for input(s): HGBA1C in the last 72 hours. CBG: Recent Labs  Lab 03/29/18 1131 03/29/18 1616 03/29/18 2234 03/30/18 0745 03/30/18 1122  GLUCAP 134* 121* 162* 89 100*   Lipid Profile: No results for input(s): CHOL, HDL, LDLCALC, TRIG, CHOLHDL, LDLDIRECT in the last 72 hours. Thyroid Function Tests: No results for input(s): TSH, T4TOTAL, FREET4, T3FREE, THYROIDAB in the last 72 hours. Anemia Panel: No results for input(s): VITAMINB12, FOLATE, FERRITIN, TIBC, IRON, RETICCTPCT in the last 72 hours. Sepsis Labs: Recent Labs  Lab 03/29/18 0340 03/30/18 0311  PROCALCITON 0.21 0.34    Recent Results (from the past 240 hour(s))  Culture, blood (routine x 2)     Status: None   Collection Time: 03/23/18 12:35 AM  Result Value Ref Range Status   Specimen Description BLOOD LEFT WRIST  Final   Special Requests   Final    BOTTLES DRAWN AEROBIC ONLY Blood Culture results may not be optimal due to an inadequate volume of blood received in culture bottles   Culture   Final    NO GROWTH 5 DAYS Performed at [facility], [address] [place], [place] [address]    Report Status 03/28/2018  FINAL  Final  Culture, blood (routine x 2)     Status: None   Collection Time: 03/23/18 12:40 AM  Result Value Ref Range Status   Specimen Description BLOOD LEFT HAND  Final   Special Requests   Final    BOTTLES DRAWN AEROBIC ONLY Blood Culture results may not be optimal due to an inadequate volume of blood received in culture bottles   Culture   Final    NO GROWTH 5 DAYS Performed at [facility], [address], [place], [place] [address]    Report Status 03/28/2018 FINAL  Final  Culture, Urine     Status: Abnormal   Collection Time: 03/25/18  9:30 AM  Result Value Ref Range Status   Specimen Description URINE, RANDOM  Final   Special Requests   Final    NONE Performed at [facility], [address] [place], [place] [address]    Culture >=100,000 COLONIES/mL PROTEUS MIRABILIS (A)  Final   Report Status 03/28/2018 FINAL  Final   Organism ID, Bacteria PROTEUS MIRABILIS (A)  Final      Susceptibility   Proteus mirabilis - MIC*    AMPICILLIN >=32 RESISTANT Resistant     CEFAZOLIN 8 SENSITIVE Sensitive     CEFTRIAXONE <=1 SENSITIVE Sensitive     CIPROFLOXACIN >=4 RESISTANT Resistant     GENTAMICIN <=1 SENSITIVE Sensitive     IMIPENEM 2 SENSITIVE Sensitive     NITROFURANTOIN 256 RESISTANT Resistant     TRIMETH/SULFA >=320 RESISTANT Resistant     AMPICILLIN/SULBACTAM 16 INTERMEDIATE Intermediate     PIP/TAZO <=4 SENSITIVE Sensitive     * >=  100,000 COLONIES/mL PROTEUS MIRABILIS  Culture, blood (Routine X 2) w Reflex to ID Panel     Status: None (Preliminary result)   Collection Time: 03/28/18 10:40 AM  Result Value Ref Range Status   Specimen Description BLOOD LEFT HAND  Final   Special Requests   Final    BOTTLES DRAWN AEROBIC ONLY Blood Culture results may not be optimal due to an inadequate volume of blood received in culture bottles   Culture   Final    NO GROWTH 2 DAYS Performed at [facility], [address] [place], [place] [address]    Report  Status PENDING  Incomplete  Culture, blood (Routine X 2) w Reflex to ID Panel     Status: None (Preliminary result)   Collection Time: 03/28/18 10:48 AM  Result Value Ref Range Status   Specimen Description BLOOD LEFT HAND  Final   Special Requests   Final    BOTTLES DRAWN AEROBIC ONLY Blood Culture adequate volume   Culture   Final    NO GROWTH 2 DAYS Performed at [facility], [address], [place], [place] [address]    Report Status PENDING  Incomplete  Culture, Urine     Status: Abnormal   Collection Time: 03/28/18 11:14 AM  Result Value Ref Range Status   Specimen Description URINE, CATHETERIZED  Final   Special Requests   Final    NONE Performed at [facility], [address], [place], [place] [address]    Culture >=100,000 COLONIES/mL PROTEUS MIRABILIS (A)  Final   Report Status 03/30/2018 FINAL  Final   Organism ID, Bacteria PROTEUS MIRABILIS (A)  Final      Susceptibility   Proteus mirabilis - MIC*    AMPICILLIN >=32 RESISTANT Resistant     CEFAZOLIN 8 SENSITIVE Sensitive     CEFTRIAXONE <=1 SENSITIVE Sensitive     CIPROFLOXACIN >=4 RESISTANT Resistant     GENTAMICIN <=1 SENSITIVE Sensitive     IMIPENEM 2 SENSITIVE Sensitive     NITROFURANTOIN 128 RESISTANT Resistant     TRIMETH/SULFA >=320 RESISTANT Resistant     AMPICILLIN/SULBACTAM 16 INTERMEDIATE Intermediate     PIP/TAZO <=4 SENSITIVE Sensitive     * >=100,000 COLONIES/mL PROTEUS MIRABILIS         Radiology Studies: No results found.      Scheduled Meds: . baclofen  10 mg Oral TID  . clonazePAM  0.5 mg Oral Q24H  . clonazePAM  1 mg Oral BID  . collagenase   Topical Daily  . desmopressin  20 mcg Nasal BID  . divalproex  500 mg Oral TID  . enoxaparin (LOVENOX) injection  40 mg Subcutaneous Q24H  . feeding supplement (ENSURE ENLIVE)  237 mL Oral TID BM  . feeding supplement (PRO-STAT SUGAR FREE 64)  30 mL Oral BID BM  . insulin aspart  0-5 Units Subcutaneous QHS  . insulin aspart   0-9 Units Subcutaneous TID WC  . insulin detemir  10 Units Subcutaneous Daily  . mouth rinse  15 mL Mouth Rinse BID  . metoprolol tartrate  50 mg Oral QHS  . metoprolol tartrate  75 mg Oral Daily  . multivitamin with minerals  1 tablet Oral Daily  . polyethylene glycol  17 g Oral Daily  . QUEtiapine  125 mg Oral QHS  . QUEtiapine  150 mg Oral Daily  . selenium sulfide  1 application Topical Q T,Th,Sat-1800  . sertraline  100 mg Oral Daily  Continuous Infusions: . sodium chloride Stopped (03/24/18 1925)  . cefTRIAXone (ROCEPHIN)  IV 1 g (03/30/18 1540)     LOS: 19 days    Time spent: over 30 min MDM moderate with recurrent fevers 8/12, proteus UTI, worsening hypernatremia   Lacretia Nicks, MD [facility] Pager [phone]  If 7PM-7AM, please contact night-coverage [email or web address] Password [facility] 03/30/2018, 4:07 PM

## 2018-03-30 NOTE — Progress Notes (Signed)
  Speech Language Pathology Treatment: Dysphagia  Patient Details Name: Cody [name] MRN: [id number] DOB: [birth year] Today's Date: 03/30/2018 Time: 4098-1191 SLP Time Calculation (min) (ACUTE ONLY): 10 min  Assessment / Plan / Recommendation Clinical Impression  Pt seen at bedside for assessment of diet tolerance. RN present upon arrival of SLP, providing crushed meds in puree. RN reports pt appears to tolerate puree diet, and eats well when fed. Pt exhibited no overt s/s aspiration with trials of puree and thin liquids, however, he continues to have chronic aspiration risk. RN reports pt does not like to be seated upright, however, upright position may decrease this risk. RN reports improved po intake, and alertness. ST will sign off at this time. Please reconsult if needs arise.    HPI HPI: Cody [name] is a 52 y.o. male with medical history significant of traumatic brain injury at 17 yrs of age (s/p of ventriculoperitoneal shunt), spasmatic left hemiplegia, bedbound, hypertension, diabetes mellitus, hypothyroidism, depression, anxiety, who presents with fever, worsening mental status, abdominal pain.  Chest x-ray showed bilateral basilar subsegment atelectasis. Found to have multiple nonobstructing right renal calculi measuring up to 10 mm and underwent cystoscopy, right retrograde ureteropyelogram, placement of stent. MBS at [facility] end of 2018 he did not aspirate or penetrate, dealyed swallow (thin liq's and appears regular texture recommended). Followed by ST, increased coughing with RN, ST downgraded to puree, continue thin. CXR 7/29 showed Dense focal areas of airspace filling throughout the lungs bilateral, likely infectious.   MBS 03/17/18 showed possible penetration, no aspiration.  CXR 03/28/18 showed increased right infiltrate       SLP Plan  All goals met - DC skilled ST       Recommendations  Diet recommendations: Dysphagia 1 (puree);Thin liquid Liquids provided via:  Cup;Straw Medication Administration: Crushed with puree Supervision: Staff to assist with self feeding;Full supervision/cueing for compensatory strategies Compensations: Minimize environmental distractions;Slow rate;Small sips/bites Postural Changes and/or Swallow Maneuvers: Seated upright 90 degrees;Upright 30-60 min after meal                Oral Care Recommendations: Oral care BID Follow up Recommendations: Skilled Nursing facility SLP Visit Diagnosis: Dysphagia, oropharyngeal phase (R13.12) Plan: All goals met       GO              Celia B. Quentin Ore [facility], [place] Speech Language Pathologist [phone]  Shonna Chock 03/30/2018, 10:58 AM

## 2018-03-30 NOTE — Progress Notes (Signed)
Pt is having a green pus coming out and around his urethra, around the foley catheter. Peri care and foley completed. Md on call made aware. Waiting on pt to get ct scan.

## 2018-03-31 ENCOUNTER — Inpatient Hospital Stay (HOSPITAL_COMMUNITY): Payer: Medicare Other

## 2018-03-31 HISTORY — PX: IR NEPHROSTOMY PLACEMENT RIGHT: IMG6064

## 2018-03-31 HISTORY — PX: IR RADIOLOGY PERIPHERAL GUIDED IV START: IMG5598

## 2018-03-31 HISTORY — PX: IR US GUIDE VASC ACCESS LEFT: IMG2389

## 2018-03-31 LAB — CBC WITH DIFFERENTIAL/PLATELET
Abs Immature Granulocytes: 0.1 10*3/uL (ref 0.0–0.1)
Basophils Absolute: 0 10*3/uL (ref 0.0–0.1)
Basophils Relative: 0 %
EOS ABS: 0.1 10*3/uL (ref 0.0–0.7)
EOS PCT: 1 %
HEMATOCRIT: 25.8 % — AB (ref 39.0–52.0)
HEMOGLOBIN: 7.7 g/dL — AB (ref 13.0–17.0)
Immature Granulocytes: 1 %
LYMPHS ABS: 3.7 10*3/uL (ref 0.7–4.0)
LYMPHS PCT: 31 %
MCH: 26.3 pg (ref 26.0–34.0)
MCHC: 29.8 g/dL — AB (ref 30.0–36.0)
MCV: 88.1 fL (ref 78.0–100.0)
MONOS PCT: 11 %
Monocytes Absolute: 1.3 10*3/uL — ABNORMAL HIGH (ref 0.1–1.0)
Neutro Abs: 6.9 10*3/uL (ref 1.7–7.7)
Neutrophils Relative %: 56 %
Platelets: 257 10*3/uL (ref 150–400)
RBC: 2.93 MIL/uL — AB (ref 4.22–5.81)
RDW: 17.2 % — AB (ref 11.5–15.5)
WBC: 12.1 10*3/uL — ABNORMAL HIGH (ref 4.0–10.5)

## 2018-03-31 LAB — HEPATIC FUNCTION PANEL
ALK PHOS: 85 U/L (ref 38–126)
ALT: 15 U/L (ref 0–44)
AST: 20 U/L (ref 15–41)
Albumin: 1.4 g/dL — ABNORMAL LOW (ref 3.5–5.0)
Bilirubin, Direct: <0.1 mg/dL (ref 0.0–0.2)
TOTAL PROTEIN: 8.5 g/dL — AB (ref 6.5–8.1)
Total Bilirubin: 0.4 mg/dL (ref 0.3–1.2)

## 2018-03-31 LAB — GLUCOSE, CAPILLARY
GLUCOSE-CAPILLARY: 120 mg/dL — AB (ref 70–99)
Glucose-Capillary: 80 mg/dL (ref 70–99)
Glucose-Capillary: 105 mg/dL — ABNORMAL HIGH (ref 70–99)

## 2018-03-31 LAB — BASIC METABOLIC PANEL
Anion gap: 5 (ref 5–15)
BUN: 16 mg/dL (ref 6–20)
CHLORIDE: 109 mmol/L (ref 98–111)
CO2: 30 mmol/L (ref 22–32)
CREATININE: 0.73 mg/dL (ref 0.61–1.24)
Calcium: 7.7 mg/dL — ABNORMAL LOW (ref 8.9–10.3)
GFR calc Af Amer: >60 mL/min (ref >60)
GFR calc non Af Amer: >60 mL/min (ref >60)
GLUCOSE: 121 mg/dL — AB (ref 70–99)
Potassium: 3.4 mmol/L — ABNORMAL LOW (ref 3.5–5.1)
SODIUM: 144 mmol/L (ref 135–145)

## 2018-03-31 LAB — FERRITIN: Ferritin: 411 ng/mL — ABNORMAL HIGH (ref 24–336)

## 2018-03-31 LAB — PREPARE RBC (CROSSMATCH)

## 2018-03-31 LAB — CBC
HEMATOCRIT: 22.7 % — AB (ref 39.0–52.0)
HEMOGLOBIN: 6.7 g/dL — AB (ref 13.0–17.0)
MCH: 25.9 pg — AB (ref 26.0–34.0)
MCHC: 29.5 g/dL — AB (ref 30.0–36.0)
MCV: 87.6 fL (ref 78.0–100.0)
Platelets: 267 10*3/uL (ref 150–400)
RBC: 2.59 MIL/uL — ABNORMAL LOW (ref 4.22–5.81)
RDW: 17.7 % — ABNORMAL HIGH (ref 11.5–15.5)
WBC: 11.2 10*3/uL — ABNORMAL HIGH (ref 4.0–10.5)

## 2018-03-31 LAB — IRON AND TIBC
IRON: 27 ug/dL — AB (ref 45–182)
Saturation Ratios: 16 % — ABNORMAL LOW (ref 17.9–39.5)
TIBC: 165 ug/dL — AB (ref 250–450)
UIBC: 138 ug/dL

## 2018-03-31 LAB — VITAMIN B12: VITAMIN B 12: 1098 pg/mL — AB (ref 180–914)

## 2018-03-31 LAB — MAGNESIUM: Magnesium: 2 mg/dL (ref 1.7–2.4)

## 2018-03-31 LAB — PROCALCITONIN: Procalcitonin: 0.23 ng/mL

## 2018-03-31 LAB — FOLATE: Folate: 14.4 ng/mL (ref >5.9)

## 2018-03-31 LAB — PROTIME-INR
INR: 1.29
PROTHROMBIN TIME: 16 s — AB (ref 11.4–15.2)

## 2018-03-31 MED ORDER — SODIUM CHLORIDE 0.9% FLUSH
5.0000 mL | Freq: Three times a day (TID) | INTRAVENOUS | Status: DC
  Administered 2018-04-01 – 2018-04-07 (×16): 5 mL

## 2018-03-31 MED ORDER — SODIUM CHLORIDE 0.9% IV SOLUTION: Freq: Once | INTRAVENOUS | Status: DC

## 2018-03-31 MED ORDER — LIDOCAINE HCL 1 % IJ SOLN
INTRAMUSCULAR | Status: AC
  Filled 2018-03-31: qty 20

## 2018-03-31 MED ORDER — IOPAMIDOL (ISOVUE-300) INJECTION 61%
INTRAVENOUS | Status: AC
  Filled 2018-03-31: qty 50

## 2018-03-31 MED ORDER — IOPAMIDOL (ISOVUE-300) INJECTION 61%
INTRAVENOUS | Status: AC
  Administered 2018-03-31: 30 mL
  Filled 2018-03-31: qty 50

## 2018-03-31 MED ORDER — LIDOCAINE HCL 1 % IJ SOLN
INTRAMUSCULAR | Status: AC | PRN
  Administered 2018-03-31: 20 mL

## 2018-03-31 MED ORDER — CIPROFLOXACIN IN D5W 400 MG/200ML IV SOLN
400.0000 mg | INTRAVENOUS | Status: AC
  Administered 2018-03-31: 400 mg via INTRAVENOUS
  Filled 2018-03-31: qty 200

## 2018-03-31 MED ORDER — POTASSIUM CHLORIDE CRYS ER 20 MEQ PO TBCR
40.0000 meq | EXTENDED_RELEASE_TABLET | Freq: Once | ORAL | Status: AC
  Administered 2018-03-31: 40 meq via ORAL
  Filled 2018-03-31: qty 2

## 2018-03-31 MED ORDER — MIDAZOLAM HCL 2 MG/2ML IJ SOLN
INTRAMUSCULAR | Status: AC | PRN
  Administered 2018-03-31 (×2): 1 mg via INTRAVENOUS

## 2018-03-31 MED ORDER — FERROUS SULFATE 325 (65 FE) MG PO TABS
325.0000 mg | ORAL_TABLET | Freq: Every day | ORAL | Status: DC
  Administered 2018-04-01 – 2018-04-07 (×7): 325 mg via ORAL
  Filled 2018-03-31 (×7): qty 1

## 2018-03-31 MED ORDER — MIDAZOLAM HCL 2 MG/2ML IJ SOLN
INTRAMUSCULAR | Status: AC
  Filled 2018-03-31: qty 2

## 2018-03-31 MED ORDER — FENTANYL CITRATE (PF) 100 MCG/2ML IJ SOLN
INTRAMUSCULAR | Status: AC
  Filled 2018-03-31: qty 2

## 2018-03-31 MED ORDER — FENTANYL CITRATE (PF) 100 MCG/2ML IJ SOLN
INTRAMUSCULAR | Status: AC | PRN
  Administered 2018-03-31 (×2): 50 ug via INTRAVENOUS

## 2018-03-31 NOTE — Sedation Documentation (Signed)
Pt stable at this time. MD aware of vitals signs.

## 2018-03-31 NOTE — Progress Notes (Signed)
PROGRESS NOTE    Cody [name]  ZOX:[id number] DOB: 12-12-1965 DOA: 03/10/2018 PCP: [name], MD   Brief Narrative:  52 year old male TBI, spastic left hemiplegia DM 2, hypothyroid, bipolar. Admitted 03/11/2018 increasing lethargy X 48 hours. Admitted with sepsis protocol started-found to have AKI creatinine 2.1 BUN 40 T-max 102.6. WBC 15 CT abdomen pelvis right obstructive uropathy 7X5 UVJ stone. CT head = extensive encephalomalacia slight interval enlargement of the ventricular system compared to 2009 CT. Presumed sepsis from acute pyelonephritis started on Vanco Zosyn given 4 L of fluid and kept on stepdown unit. Urologist was consulted underwent cystoscopy and placement of 6 French X 24 cm JJ stent 7/25. Hospitalization complicated by persisting fever and sepsis.  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  Nephrology was consulted.  Assessment & Plan:   Principal Problem:   Acute pyelonephritis Active Problems:   Depression   Diabetes mellitus without complication (HCC)   Hypertension   Spastic hemiplegia (HCC)   Sepsis (HCC)   AKI (acute kidney injury) (HCC)   Hydroureteronephrosis-right   Ureterovesical junction (UVJ) obstruction   Acute metabolic encephalopathy   Acute respiratory failure with hypoxia (HCC)   Pressure injury of skin   Right kidney stone   Hypernatremia   Aspiration into airway   Palliative care encounter   Diabetes insipidus (HCC)   Sepsis likely multifactorial  Persistent fever Pt with R obsteructive uropathy with 7 x 5 mm R ureterovesical junction stone causing hydroureteronephrosis and perinephric stranding (with multiple nonobstructing calculi measuring up to 10 mm) S/p cystoscopy with R retrograde ureteropyelogram, fluoscopic interpretation, placement of 6 french by 24 cm contour double J stent without tether on 7/25 Pt with recurrent fevers and infectious disease was consulted due to fever without Shaden Lacher clear source.   He was  started on unasyn for possible aspiration pneumonia and then transitioned to augmentin.  He has completed course of treatment.  Patient continued to have fevers. Some concern for drug fever? Last fever 8/12  Blood cx from 7/28 NGTD as well as 8/6 NGTD.   Repeat urine culture from 8/8 with proteus sensitive to ceftriaxone. Repeat blood cx from 8/11 NGTD x2 days  Repeat urine cx from 8/11 again growing proteus  CXR with opacity in RUL thought 2/2 atelectasis and pneumonia Will remove and replace foley catheter Start ceftriaxone for proteus UTI.  Will hold off on treating pneumonia as pt doing well on RA and was treated earlier in hospitalization for this. Discussed with urology, will follow CT (notable for R ureteral stent with mild residual hydronephrosis, delayed R renal nephrogram and swelling of R kidney with respect to L may indicate abnormal renal function 2/2 obstruction or infection - discussed with urology who recommended perc nephrostomy tube placement -> IR consulted) PCT downtrending  Severe hypernatremia thought to be secondary to central diabetes insipidus/acute kidney injury Slightly improved today Patient was seen by nephrology.   He was placed on desmopressin 20 mcg nasal BID and dextrose infusion.   Sodium level has improved  He has been tolerating his diet and water.   Discussed with nursing, encourage PO free water intake, at least 250 cc PO three times in between meals (and at least 250 cc 3 times day with meals) He will need continued encouragement to consume water even at skilled nursing facility.  We will continue with current dose of desmopressin intranasally.   Will need periodic monitoring of his sodium levels.   If he develops hypernatremia despite these measures  then further discussions will need to be held with family as the only way to provide him free water than would be to place Arleene Settle PEG tube (discussed with sister this possibility today)  History of traumatic  brain injury with left hemiplegia Patient is stable.  Patient has Kaj Vasil shunt.  This was discussed with neurosurgery who did not feel like his symptoms were secondary to shunt malfunction.  Neurologically he appears to have improved.  He is probably close to his baseline.  Per neurosurgery, consider shunt evaluation after abx.   Chronic aspiration/oropharyngeal dysphagia He is on dysphagia 1 diet.  Speech therapy has seen.  Remains at high risk for recurrent aspirations.  Palliative medicine has been following.   History of bipolar disorder He is on medications which are being continued.  Stage 3 pressure injury  Noted in the right posterior thigh and left buttock area and left heel.  Wound care.  No active infection appreciated.  Frequent turns.  Diabetes mellitus type 2 Continue long-acting insulin and SSI.  BG's stable.  Acute metabolic encephalopathy Most likely secondary to acute infection.  Appears close to baseline.  Stable.  Acute respiratory failure with hypoxia Secondary to aspiration.  Improved.  No PE noted on CT angiogram done on 7/29.  Normocytic anemia Received transfusion for hb of 6.7 8/14.   Follow iron panel, b12, folate No evidence of bleeding, follow  Essential hypertension Blood pressure is well controlled.  Patient is on metoprolol.  Obstructive uropathy Status post stent placement -> now having perc neph tube placed on 8/14   Imaging studies did suggest Lakeisha Waldrop retained guidewire as well as proximal aspect of stent may be in renal parenchyma.   Urology is aware of this.   Follow-up with urology after discharge.  He will need to be discharged with Foley catheter (discussed with urology who noted we could try trial of void while he's here - will wait for CT scan first).  Nonsustained Vtach: follow, no echo to eval EF.  Will continue to monitor.    Hypokalemia: replete, follow  DVT prophylaxis: lovenox Code Status: full  Family Communication: discussed  with sister 8/14 Disposition Plan: pending improvement   Consultants:   Urology  Neurosurgery  ID  Nephrology  Palliative  Procedures:  On 7/25: Cystoscopy, right retrograde ureteropyelogram, fluoroscopic interpretation, placement of 6 French by 24 cm contour double-J stent without tether  Lower extremity Doppler study  Negative for DVT  Antimicrobials: Anti-infectives (From admission, onward)   Start     Dose/Rate Route Frequency Ordered Stop   03/29/18 1430  cefTRIAXone (ROCEPHIN) 1 g in sodium chloride 0.9 % 100 mL IVPB     1 g 200 mL/hr over 30 Minutes Intravenous Every 24 hours 03/29/18 1430     03/19/18 1400  amoxicillin-clavulanate (AUGMENTIN) 875-125 MG per tablet 1 tablet     1 tablet Oral Every 12 hours 03/19/18 0911 03/23/18 2224   03/18/18 1700  piperacillin-tazobactam (ZOSYN) IVPB 3.375 g  Status:  Discontinued     3.375 g 12.5 mL/hr over 240 Minutes Intravenous Every 8 hours 03/18/18 1343 03/19/18 0910   03/17/18 1400  Ampicillin-Sulbactam (UNASYN) 3 g in sodium chloride 0.9 % 100 mL IVPB  Status:  Discontinued     3 g 200 mL/hr over 30 Minutes Intravenous Every 6 hours 03/17/18 1300 03/18/18 1337   03/14/18 1430  ceFEPIme (MAXIPIME) 2 g in sodium chloride 0.9 % 100 mL IVPB  Status:  Discontinued     2 g  200 mL/hr over 30 Minutes Intravenous Every 24 hours 03/14/18 1136 03/15/18 0956   03/14/18 0100  vancomycin (VANCOCIN) 500 mg in sodium chloride 0.9 % 100 mL IVPB  Status:  Discontinued     500 mg 100 mL/hr over 60 Minutes Intravenous Every 12 hours 03/13/18 1510 03/15/18 0956   03/11/18 2000  vancomycin (VANCOCIN) IVPB 1000 mg/200 mL premix  Status:  Discontinued     1,000 mg 200 mL/hr over 60 Minutes Intravenous Every 24 hours 03/10/18 2053 03/11/18 1342   03/11/18 1400  vancomycin (VANCOCIN) IVPB 750 mg/150 ml premix  Status:  Discontinued     750 mg 150 mL/hr over 60 Minutes Intravenous Every 12 hours 03/11/18 1342 03/13/18 1510   03/11/18 0400   piperacillin-tazobactam (ZOSYN) IVPB 3.375 g  Status:  Discontinued     3.375 g 12.5 mL/hr over 240 Minutes Intravenous Every 8 hours 03/10/18 2053 03/14/18 1136   03/10/18 2030  piperacillin-tazobactam (ZOSYN) IVPB 3.375 g     3.375 g 100 mL/hr over 30 Minutes Intravenous  Once 03/10/18 2023 03/10/18 2139   03/10/18 2030  vancomycin (VANCOCIN) IVPB 1000 mg/200 mL premix     1,000 mg 200 mL/hr over 60 Minutes Intravenous  Once 03/10/18 2023 03/10/18 2208     Subjective: Eating with tech. Grinding teeth.  Objective: Vitals:   03/30/18 1606 03/30/18 1950 03/31/18 0101 03/31/18 0638  BP: 122/73 113/67 110/75 134/78  Pulse: (!) 105 100 87 90  Resp: (!) 29 (!) 28  (!) 23  Temp: 99 F (37.2 C)   (!) 97.5 F (36.4 C)  TempSrc: Rectal   Axillary  SpO2: 100% 98%  98%  Weight:    63 kg  Height:        Intake/Output Summary (Last 24 hours) at 03/31/2018 0840 Last data filed at 03/31/2018 0644 Gross per 24 hour  Intake 720 ml  Output 1750 ml  Net -1030 ml   Filed Weights   03/29/18 0333 03/30/18 0616 03/31/18 5366  Weight: 54.9 kg 58.1 kg 63 kg    Examination:  General: No acute distress.  Eating breakfast with tech. Cardiovascular: Heart sounds show Shacola Schussler regular rate, and rhythm.  Lungs: Clear to auscultation bilaterally with good air movement.  Abdomen: Soft, nontender, nondistended Neurological: Alert and disoriented.  Skin: Warm and dry. Ulcers not examined today Extremities: contratures   Data Reviewed: I have personally reviewed following labs and imaging studies  CBC: Recent Labs  Lab 03/27/18 0507 03/28/18 0407 03/29/18 0340 03/30/18 0311 03/31/18 0344  WBC 11.2* 11.5* 13.5* 13.4* 11.2*  HGB 8.1* 7.4* 7.0* 7.8* 6.7*  HCT 27.1* 25.1* 23.7* 28.0* 22.7*  MCV 87.1 88.4 87.5 94.3 87.6  PLT 318 316 357 252 267   Basic Metabolic Panel: Recent Labs  Lab 03/27/18 0507 03/27/18 1333 03/28/18 0407 03/29/18 0340 03/30/18 0311 03/31/18 0344  NA 140 140 141 143  146* 144  K 4.2  --  3.7 3.9 4.8 3.4*  CL 105  --  106 108 109 109  CO2 27  --  27 30 27 30   GLUCOSE 98  --  131* 147* 98 121*  BUN 15  --  19 19 23* 16  CREATININE 0.72  --  0.83 0.80 0.93 0.73  CALCIUM 7.3*  --  7.5* 7.6* 7.8* 7.7*  MG  --   --   --   --  1.9 2.0   GFR: Estimated Creatinine Clearance: 97.5 mL/min (by C-G formula based on SCr of 0.73  mg/dL). Liver Function Tests: Recent Labs  Lab 03/27/18 0507  AST 27  ALT 19  ALKPHOS 96  BILITOT 0.6  PROT 8.4*  ALBUMIN 1.5*   No results for input(s): LIPASE, AMYLASE in the last 168 hours. No results for input(s): AMMONIA in the last 168 hours. Coagulation Profile: No results for input(s): INR, PROTIME in the last 168 hours. Cardiac Enzymes: No results for input(s): CKTOTAL, CKMB, CKMBINDEX, TROPONINI in the last 168 hours. BNP (last 3 results) No results for input(s): PROBNP in the last 8760 hours. HbA1C: No results for input(s): HGBA1C in the last 72 hours. CBG: Recent Labs  Lab 03/30/18 0745 03/30/18 1122 03/30/18 1605 03/30/18 2227 03/31/18 0803  GLUCAP 89 100* 92 126* 80   Lipid Profile: No results for input(s): CHOL, HDL, LDLCALC, TRIG, CHOLHDL, LDLDIRECT in the last 72 hours. Thyroid Function Tests: No results for input(s): TSH, T4TOTAL, FREET4, T3FREE, THYROIDAB in the last 72 hours. Anemia Panel: No results for input(s): VITAMINB12, FOLATE, FERRITIN, TIBC, IRON, RETICCTPCT in the last 72 hours. Sepsis Labs: Recent Labs  Lab 03/29/18 0340 03/30/18 0311 03/31/18 0344  PROCALCITON 0.21 0.34 0.23    Recent Results (from the past 240 hour(s))  Culture, blood (routine x 2)     Status: None   Collection Time: 03/23/18 12:35 AM  Result Value Ref Range Status   Specimen Description BLOOD LEFT WRIST  Final   Special Requests   Final    BOTTLES DRAWN AEROBIC ONLY Blood Culture results may not be optimal due to an inadequate volume of blood received in culture bottles   Culture   Final    NO GROWTH 5  DAYS Performed at [facility], [address] [place], [place] [address]    Report Status 03/28/2018 FINAL  Final  Culture, blood (routine x 2)     Status: None   Collection Time: 03/23/18 12:40 AM  Result Value Ref Range Status   Specimen Description BLOOD LEFT HAND  Final   Special Requests   Final    BOTTLES DRAWN AEROBIC ONLY Blood Culture results may not be optimal due to an inadequate volume of blood received in culture bottles   Culture   Final    NO GROWTH 5 DAYS Performed at [facility], [address], [place], [place] [address]    Report Status 03/28/2018 FINAL  Final  Culture, Urine     Status: Abnormal   Collection Time: 03/25/18  9:30 AM  Result Value Ref Range Status   Specimen Description URINE, RANDOM  Final   Special Requests   Final    NONE Performed at [facility], [address] [place], [place] [address]    Culture >=100,000 COLONIES/mL PROTEUS MIRABILIS (Junko Ohagan)  Final   Report Status 03/28/2018 FINAL  Final   Organism ID, Bacteria PROTEUS MIRABILIS (Murphy Bundick)  Final      Susceptibility   Proteus mirabilis - MIC*    AMPICILLIN >=32 RESISTANT Resistant     CEFAZOLIN 8 SENSITIVE Sensitive     CEFTRIAXONE <=1 SENSITIVE Sensitive     CIPROFLOXACIN >=4 RESISTANT Resistant     GENTAMICIN <=1 SENSITIVE Sensitive     IMIPENEM 2 SENSITIVE Sensitive     NITROFURANTOIN 256 RESISTANT Resistant     TRIMETH/SULFA >=320 RESISTANT Resistant     AMPICILLIN/SULBACTAM 16 INTERMEDIATE Intermediate     PIP/TAZO <=4 SENSITIVE Sensitive     * >=100,000 COLONIES/mL PROTEUS MIRABILIS  Culture, blood (Routine X 2) w  Reflex to ID Panel     Status: None (Preliminary result)   Collection Time: 03/28/18 10:40 AM  Result Value Ref Range Status   Specimen Description BLOOD LEFT HAND  Final   Special Requests   Final    BOTTLES DRAWN AEROBIC ONLY Blood Culture results may not be optimal due to an inadequate volume of blood received in culture bottles   Culture   Final     NO GROWTH 2 DAYS Performed at [facility], [address] [place], [place] [address]    Report Status PENDING  Incomplete  Culture, blood (Routine X 2) w Reflex to ID Panel     Status: None (Preliminary result)   Collection Time: 03/28/18 10:48 AM  Result Value Ref Range Status   Specimen Description BLOOD LEFT HAND  Final   Special Requests   Final    BOTTLES DRAWN AEROBIC ONLY Blood Culture adequate volume   Culture   Final    NO GROWTH 2 DAYS Performed at [facility], [address] [place], [place] [address]    Report Status PENDING  Incomplete  Culture, Urine     Status: Abnormal   Collection Time: 03/28/18 11:14 AM  Result Value Ref Range Status   Specimen Description URINE, CATHETERIZED  Final   Special Requests   Final    NONE Performed at [facility], [address] [place], [place] [address]    Culture >=100,000 COLONIES/mL PROTEUS MIRABILIS (Ermal Brzozowski)  Final   Report Status 03/30/2018 FINAL  Final   Organism ID, Bacteria PROTEUS MIRABILIS (Denman Pichardo)  Final      Susceptibility   Proteus mirabilis - MIC*    AMPICILLIN >=32 RESISTANT Resistant     CEFAZOLIN 8 SENSITIVE Sensitive     CEFTRIAXONE <=1 SENSITIVE Sensitive     CIPROFLOXACIN >=4 RESISTANT Resistant     GENTAMICIN <=1 SENSITIVE Sensitive     IMIPENEM 2 SENSITIVE Sensitive     NITROFURANTOIN 128 RESISTANT Resistant     TRIMETH/SULFA >=320 RESISTANT Resistant     AMPICILLIN/SULBACTAM 16 INTERMEDIATE Intermediate     PIP/TAZO <=4 SENSITIVE Sensitive     * >=100,000 COLONIES/mL PROTEUS MIRABILIS         Radiology Studies: Dg Chest 2 View  Result Date: 03/30/2018 CLINICAL DATA:  Fever EXAM: CHEST - 2 VIEW COMPARISON:  March 28, 2018 FINDINGS: There is persistent airspace consolidation in the right upper lobe with volume loss. There is atelectatic change in the left base. Heart is mildly enlarged with pulmonary venous hypertension. No adenopathy. No appreciable bone lesions. IMPRESSION:  Persistent pneumonia right upper lobe. Left base atelectasis. Pulmonary vascular congestion present. Electronically Signed   By: Bretta BangWilliam  Woodruff III M.D.   On: 03/30/2018 21:27   Ct Abdomen Pelvis W Contrast  Result Date: 03/31/2018 CLINICAL DATA:  Fever of unknown origin. Green pus coming out from the urethra around the Foley catheter. EXAM: CT ABDOMEN AND PELVIS WITH CONTRAST TECHNIQUE: Multidetector CT imaging of the abdomen and pelvis was performed using the standard protocol following bolus administration of intravenous contrast. CONTRAST:  100mL OMNIPAQUE IOHEXOL 300 MG/ML  SOLN COMPARISON:  03/14/2018 FINDINGS: Lower chest: Atelectasis or infiltration in both lung bases with small pleural effusions, improving since previous study. Hepatobiliary: There is Jahad Old large stone in the gallbladder neck. No gallbladder wall thickening or inflammatory changes. No bile duct dilatation. No focal liver lesions. Pancreas: Unremarkable. No pancreatic ductal dilatation or surrounding inflammatory changes. Spleen: Normal in size  without focal abnormality. Adrenals/Urinary Tract: Enlarged left adrenal gland, possibly hyperplastic. Right ureteral stent with proximal pigtail in the upper pole, possibly extending into the parenchyma, and distal pigtail in the bladder. There is some residual hydronephrosis without hydroureter. Delayed right renal nephrogram and swelling of the right kidney with respect to the left may indicate residual abnormal renal function due either to obstruction or possibly infection. Stranding around the right ureter. Cyst in the right kidney as before. Left kidney and ureter are unremarkable. The bladder is decompressed with Kensy Blizard Foley catheter in place. Stomach/Bowel: Stomach and small bowel are mostly decompressed. Contrast material is demonstrated in the distal small bowel and colon suggesting no evidence of obstruction. Colon is not abnormally distended. Diffusely stool-filled colon. Appendix is not  identified. Vascular/Lymphatic: Aortic atherosclerosis. No enlarged abdominal or pelvic lymph nodes. Inferior vena caval filter is in place. Reproductive: Prostate gland is not enlarged. Other: No free air or free fluid in the abdomen. Mild edema in the subcutaneous fat. Stranding and edema in the pelvis and around the right kidney and ureter. Tubing in the right upper quadrant, possibly representing the distal portion of ventricular peritoneal shunt. Musculoskeletal: Degenerative changes in the spine. No destructive bone lesions. Prominent degenerative changes in the hips. IMPRESSION: 1. Large stone in the gallbladder neck. No bile duct dilatation. No changes of cholecystitis. 2. Right ureteral stent with mild residual hydronephrosis. Delayed right renal nephrogram and swelling of the right kidney with respect to the left may indicate abnormal renal function due to obstruction or infection. 3. Stranding and edema in the subcutaneous fat of the pelvis and around the right kidney and ureter. 4. Foley catheter decompresses the bladder. 5. Tubing in the right upper quadrant, possibly representing the distal portion of the ventricular peritoneal shunt. 6. Atelectasis or infiltration in both lung bases. 7. Inferior vena caval filter. Electronically Signed   By: Burman NievesWilliam  Stevens M.D.   On: 03/31/2018 01:36        Scheduled Meds: . sodium chloride   Intravenous Once  . baclofen  10 mg Oral TID  . clonazePAM  0.5 mg Oral Q24H  . clonazePAM  1 mg Oral BID  . collagenase   Topical Daily  . desmopressin  20 mcg Nasal BID  . divalproex  500 mg Oral TID  . enoxaparin (LOVENOX) injection  40 mg Subcutaneous Q24H  . feeding supplement (ENSURE ENLIVE)  237 mL Oral TID BM  . feeding supplement (PRO-STAT SUGAR FREE 64)  30 mL Oral BID BM  . free water  250 mL Oral TID BM  . insulin aspart  0-5 Units Subcutaneous QHS  . insulin aspart  0-9 Units Subcutaneous TID WC  . insulin detemir  10 Units Subcutaneous Daily    . mouth rinse  15 mL Mouth Rinse BID  . metoprolol tartrate  50 mg Oral QHS  . metoprolol tartrate  75 mg Oral Daily  . multivitamin with minerals  1 tablet Oral Daily  . polyethylene glycol  17 g Oral Daily  . QUEtiapine  125 mg Oral QHS  . QUEtiapine  150 mg Oral Daily  . selenium sulfide  1 application Topical Q T,Th,Sat-1800  . sertraline  100 mg Oral Daily   Continuous Infusions: . sodium chloride Stopped (03/24/18 1925)  . cefTRIAXone (ROCEPHIN)  IV 1 g (03/30/18 1540)     LOS: 20 days    Time spent: over 30 min MDM high with anemia requiring transfusion, decision for perc tube   Elijah Birkaldwell  Lowell Guitar, MD [facility] Pager [phone]  If 7PM-7AM, please contact night-coverage [email or web address] Password [email or web address] 03/31/2018, 8:40 AM

## 2018-03-31 NOTE — Sedation Documentation (Signed)
No more sedation pefr MD- able to get 02 sats only right now

## 2018-03-31 NOTE — Sedation Documentation (Addendum)
Restless and agitated- BP is not taking pressure due to pt agitation and movements, wstas WNL

## 2018-03-31 NOTE — Sedation Documentation (Addendum)
o2 sats ok, R air, no tele, no reading

## 2018-03-31 NOTE — Consult Note (Signed)
Chief Complaint: Patient was seen in consultation today for right percutaneous nephrostomy drain placement Chief Complaint  Patient presents with  . Altered Mental Status   at the request of Dr Lenoria ChimeS Dahlstedt   Supervising Physician: Gilmer MorWagner, Jaime  [facility]  History of Present Illness: Cody [name] is a 52 y.o. male   TBI Admitted 7/25; sepsis Obstructive uropathy; renal stone Pyelonephritis - Urology placed Rt JJ stent 7/25 Still with fevers; sepsis CT yesterday: IMPRESSION: 1. Large stone in the gallbladder neck. No bile duct dilatation. No changes of cholecystitis. 2. Right ureteral stent with mild residual hydronephrosis. Delayed right renal nephrogram and swelling of the right kidney with respect to the left may indicate abnormal renal function due to obstruction or infection. 3. Stranding and edema in the subcutaneous fat of the pelvis and around the right kidney and ureter. 4. Foley catheter decompresses the bladder. 5. Tubing in the right upper quadrant, possibly representing the distal portion of the ventricular peritoneal shunt. 6. Atelectasis or infiltration in both lung bases. 7. Inferior vena caval filter.  Request for Rt Percutaneous nephrostomy Dr Loreta AveWagner has reviewed imaging and discussed with Dr Retta Dionesahlstedt Stent damage and inability to perform stent exchange Approved by Dr Loreta AveWagner   Past Medical History:  Diagnosis Date  . Depression   . Diabetes mellitus without complication (HCC)   . GAD (generalized anxiety disorder)   . Hypertension   . Hypothyroidism   . Spastic hemiplegia (HCC)   . Thyroid disease    hypothyroidism    Past Surgical History:  Procedure Laterality Date  . CYSTOSCOPY W/ URETERAL STENT PLACEMENT Right 03/11/2018   Procedure: CYSTOSCOPY WITH RETROGRADE PYELOGRAM/DOUBLE J STENT PLACEMENT;  Surgeon: Marcine Matarahlstedt, Stephen, MD;  [facility];  Service: Urology;  Laterality: Right;  . VENTRICULOPERITONEAL  SHUNT      Allergies: Patient has no allergy information on record.  Medications: Prior to Admission medications   Medication Sig Start Date End Date Taking? Authorizing Provider  acetaminophen (TYLENOL) 325 MG tablet Take 650 mg by mouth 2 (two) times daily. At 1300 and 2100   Yes [provider]  Amino Acids-Protein Hydrolys (FEEDING SUPPLEMENT, PRO-STAT SUGAR FREE 64,) LIQD Take 30 mLs by mouth daily.   Yes [provider]  amLODipine (NORVASC) 10 MG tablet Take 10 mg by mouth daily.   Yes [provider]  amoxicillin-clavulanate (AUGMENTIN) 875-125 MG tablet Take 1 tablet by mouth 2 (two) times daily.   Yes [provider]  baclofen (LIORESAL) 10 MG tablet Take 10 mg by mouth 3 (three) times daily.   Yes [provider]  clonazePAM (KLONOPIN) 1 MG tablet Take 0.5-1 mg by mouth See admin instructions. Take 1 tablet at 8 am and 8 pm then Take 1/2 tablet at 2 pm   Yes [provider]  divalproex (DEPAKOTE SPRINKLE) 125 MG capsule Take 500 mg by mouth 3 (three) times daily.   Yes [provider]  insulin detemir (LEVEMIR) 100 UNIT/ML injection Inject 20 Units into the skin daily.   Yes [provider]  ipratropium-albuterol (DUONEB) 0.5-2.5 (3) MG/3ML SOLN Take 3 mLs by nebulization 4 (four) times daily.   Yes [provider]  metoprolol tartrate (LOPRESSOR) 25 MG tablet Take 75 mg by mouth daily.   Yes [provider]  metoprolol tartrate (LOPRESSOR) 50 MG tablet Take 50 mg by mouth at bedtime.   Yes [provider]  Multiple Vitamin (MULTIVITAMIN WITH MINERALS) TABS tablet Take 1 tablet  by mouth daily.   Yes [provider]  phenylephrine-shark liver oil-mineral oil-petrolatum (PREPARATION H) 0.25-3-14-71.9 % rectal ointment Place 1 application rectally 3 (three) times daily as needed for hemorrhoids.   Yes [provider]  polyethylene glycol (MIRALAX / GLYCOLAX) packet Take  17 g by mouth daily.   Yes [provider]  QUEtiapine (SEROQUEL) 100 MG tablet Take 100-150 mg by mouth See admin instructions. Take 1 and 1/2 tablets every morning then take 1 tablet with 25 mg at bedtime to equal 125 mg   Yes [provider]  QUEtiapine (SEROQUEL) 25 MG tablet Take 25 mg by mouth at bedtime. Take with 100 mg to equal 125 mg   Yes [provider]  selenium sulfide (SELSUN) 1 % LOTN Apply 1 application topically every Tuesday, Thursday, and Saturday at 6 PM.   Yes [provider]  sertraline (ZOLOFT) 100 MG tablet Take 100 mg by mouth daily.   Yes [provider]  UNABLE TO FIND Take 120 mLs by mouth 3 (three) times daily. Med Name: MedPass 2.0   Yes [provider]     Family History  Problem Relation Age of Onset  . Hypertension Mother   . Stroke Mother   . Hypertension Father   . Hyperlipidemia Father   . COPD Father   . Heart disease Father   . Diabetes Mellitus II Father     Social History   Socioeconomic History  . Marital status: Single    Spouse name: Not on file  . Number of children: Not on file  . Years of education: Not on file  . Highest education level: Not on file  Occupational History  . Not on file  Social Needs  . Financial resource strain: Not on file  . Food insecurity:    Worry: Not on file    Inability: Not on file  . Transportation needs:    Medical: Not on file    Non-medical: Not on file  Tobacco Use  . Smoking status: Never Smoker  . Smokeless tobacco: Never Used  Substance and Sexual Activity  . Alcohol use: Never    Frequency: Never  . Drug use: Never  . Sexual activity: Not on file  Lifestyle  . Physical activity:    Days per week: Not on file    Minutes per session: Not on file  . Stress: Not on file  Relationships  . Social connections:    Talks on phone: Not on file    Gets together: Not on file    Attends religious service: Not on file    Active member of  club or organization: Not on file    Attends meetings of clubs or organizations: Not on file    Relationship status: Not on file  Other Topics Concern  . Not on file  Social History Narrative  . Not on file    Review of Systems: A 12 point ROS discussed and pertinent positives are indicated in the HPI above.  All other systems are negative.  Review of Systems  Constitutional:       Ill appearing male Left hemiplegia Non verbal    Vital Signs: BP 131/86   Pulse (!) 105   Temp 98 F (36.7 C) (Oral)   Resp (!) 26   Ht 5\' 9"  (1.753 m)   Wt 139 lb (63 kg)   SpO2 98%   BMI 20.53 kg/m   Physical Exam  Cardiovascular: Normal rate and regular  rhythm.  Pulmonary/Chest: Effort normal and breath sounds normal.  Abdominal: Soft.  Skin: Skin is warm and dry.  Psychiatric:  Consented with Sister Junious DresserConnie via phone  Nursing note and vitals reviewed.   Imaging: Ct Abdomen Pelvis Wo Contrast  Result Date: 03/11/2018 CLINICAL DATA:  Fever and lethargy. EXAM: CT ABDOMEN AND PELVIS WITHOUT CONTRAST TECHNIQUE: Multidetector CT imaging of the abdomen and pelvis was performed following the standard protocol without IV contrast. COMPARISON:  None. FINDINGS: LOWER CHEST: Small pleural effusions with bibasilar atelectasis. HEPATOBILIARY: Normal hepatic contours and density. No intra- or extrahepatic biliary dilatation. Cholelithiasis without acute inflammation. PANCREAS: Normal parenchymal contours without ductal dilatation. No peripancreatic fluid collection. SPLEEN: Normal. ADRENALS/URINARY TRACT: --Adrenal glands: Left adrenal myelolipoma. --Right kidney/ureter: There is a 7 x 5 mm stone at the right ureterovesical junction causing mild hydroureteronephrosis and perinephric stranding. There are 2-3 other nonobstructing renal calculi measuring approximately 2 mm. --Left kidney/ureter: No hydronephrosis, nephroureterolithiasis, perinephric stranding or solid renal mass. --Urinary bladder: Normal for  degree of distention STOMACH/BOWEL: --Stomach/Duodenum: No hiatal hernia or other gastric abnormality. Normal duodenal course. --Small bowel: No dilatation or inflammation. --Colon: No focal abnormality. --Appendix: Not visualized. No right lower quadrant inflammation or free fluid. VASCULAR/LYMPHATIC: Infrarenal IVC filter. Minimal aortic calcification. No abdominal or pelvic lymphadenopathy. REPRODUCTIVE: Normal prostate and seminal vesicles. MUSCULOSKELETAL. 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 Soft tissue thickening extending to the skin surface superficial to the left greater trochanter (axial image 93). OTHER: None. IMPRESSION: 1. Right obstructive uropathy with 7 x 5 mm right ureterovesical junction stone causing mild hydroureteronephrosis and perinephric stranding. Multiple nonobstructing right renal calculi measuring up to 10 mm. 2. 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 This may indicate early decubitus ulcer formation. 3. Small pleural effusions and bibasilar atelectasis. 4. Aortic Atherosclerosis (ICD10-I70.0). Electronically Signed   By: Deatra RobinsonKevin  Herman M.D.   On: 03/11/2018 00:51   Dg Skull 1-3 Views  Result Date: 03/12/2018 CLINICAL DATA:  Recent shunt malfunction EXAM: SKULL - 1-3 VIEW COMPARISON:  09/19/2007 FINDINGS: Right-sided ventriculostomy catheter is noted extending across the midline to the left. The catheter as visualized is intact. Postsurgical changes in the cervical spine are noted. No other focal abnormality is seen. IMPRESSION: Intact shunt catheter as visualized. Electronically Signed   By: Alcide CleverMark  Lukens M.D.   On: 03/12/2018 10:26   Dg Chest 2 View  Result Date: 03/30/2018 CLINICAL DATA:  Fever EXAM: CHEST - 2 VIEW COMPARISON:  March 28, 2018 FINDINGS: There is persistent airspace consolidation in the right upper lobe with volume loss. There is  atelectatic change in the left base. Heart is mildly enlarged with pulmonary venous hypertension. No adenopathy. No appreciable bone lesions. IMPRESSION: Persistent pneumonia right upper lobe. Left base atelectasis. Pulmonary vascular congestion present. Electronically Signed   By: Bretta BangWilliam  Woodruff III M.D.   On: 03/30/2018 21:27   Dg Chest 2 View  Result Date: 03/22/2018 CLINICAL DATA:  Pneumonia. EXAM: CHEST - 2 VIEW COMPARISON:  Chest x-rays dated 03/18/2018 and 03/15/2018 and chest CT dated 03/15/2018 FINDINGS: Ventriculoperitoneal shunt tube overlies the right hemithorax. Heart size and vascularity are normal. There is increased density at the right lung apex since the prior study with small patchy areas of infiltrate in the right midzone peripherally. Left lung is clear. No discrete effusions. No acute bone abnormality. Arthritic changes of both glenohumeral joints. IMPRESSION: 1. Increased infiltrate at the right lung  apex. 2. Stable faint infiltrates in the right midzone. Electronically Signed   By: Francene Boyers M.D.   On: 03/22/2018 07:55   Dg Chest 2 View  Result Date: 03/18/2018 CLINICAL DATA:  Pneumonia. EXAM: CHEST - 2 VIEW COMPARISON:  Radiograph of March 15, 2018. FINDINGS: Stable cardiomediastinal silhouette. Right-sided ventriculoperitoneal shunt is again noted. No pneumothorax is noted. Hypoinflation of the lungs is noted. Slightly increased right upper lobe airspace opacity is noted concerning for pneumonia. Minimal bibasilar subsegmental atelectasis noted. Bony thorax is unremarkable. IMPRESSION: Slightly increased right upper lobe airspace opacity is noted consistent with pneumonia. Minimal bibasilar subsegmental atelectasis. Electronically Signed   By: Lupita Raider, M.D.   On: 03/18/2018 09:16   Dg Cervical Spine 1 View  Result Date: 03/12/2018 CLINICAL DATA:  Shunt malfunction EXAM: DG CERVICAL SPINE - 1 VIEW COMPARISON:  None. FINDINGS: Visualize shunt catheter in the right  neck is intact. No acute abnormality is noted. Postsurgical changes in the cervical spine are noted. IMPRESSION: Intact shunt catheter. Electronically Signed   By: Alcide Clever M.D.   On: 03/12/2018 10:26   Dg Abd 1 View  Result Date: 03/12/2018 CLINICAL DATA:  Shunt malfunction EXAM: ABDOMEN - 1 VIEW COMPARISON:  None. FINDINGS: Shunt catheter is noted in the right upper quadrant with the tip projecting over the superior aspect of the right lobe of the liver. IVC filter is noted in place. No bony abnormality is seen. A ureteral stent is noted on the right. Radiopaque density is noted in the distal aspect of the stent which may represent a wire fragment. Correlation with the operative findings is recommended. The superior aspect of the right ureteral stent is elongated and likely within the right kidney due to patient rotation. No other focal abnormality is noted. IMPRESSION: Intact shunt catheter in the right upper abdomen. Right ureteral stent identified. There is a metallic density identified in the distal aspect of the stent likely representing a retained wire fragment. Clinical correlation is recommended. The proximal portion of the right ureteral stent is elongated and not coiled in the normal fashion although likely present in the right collecting system. Again correlation with the operative findings is recommended. These results will be called to the ordering clinician or representative by the Radiologist Assistant, and communication documented in the PACS or zVision Dashboard. Electronically Signed   By: Alcide Clever M.D.   On: 03/12/2018 10:30   Ct Head Wo Contrast  Result Date: 03/11/2018 CLINICAL DATA:  Fever lethargy abnormal speech EXAM: CT HEAD WITHOUT CONTRAST TECHNIQUE: Contiguous axial images were obtained from the base of the skull through the vertex without intravenous contrast. COMPARISON:  CT brain 09/19/2007 FINDINGS: Brain: No acute territorial infarction, hemorrhage or intracranial  mass is visualized. Right posterior shunt catheter with tip terminating in the anterior aspect of the left lateral ventricle. Extensive encephalomalacia involving the right cerebral hemisphere with encephalomalacia at the inferior left frontal lobe. Moderate severe atrophy and volume loss, slight progression since comparison CT. Slight interval enlargement of the dilated ventricular system, bifrontal measurement of 6 cm, as compared with 5.5 cm on the comparison study. No midline shift. Vascular: No hyperdense vessels.  Carotid vascular calcification Skull: No fracture.  Old left frontal and parietal burr holes. Sinuses/Orbits: Mucosal thickening in the ethmoid sinuses. No acute orbital abnormality. Other: None IMPRESSION: 1. Negative for hemorrhage or intracranial mass lesion. 2. Extensive encephalomalacia of the right hemisphere with encephalomalacia of the left frontal lobe. Cortical volume loss which appears slightly  progressed since the prior CT. 3. Similar appearance and positioning of right posterior shunt catheter. Slight interval enlargement of the ventricular system as compared with 2009 CT, it is unclear if this is secondary to progression of atrophy and volume loss versus some degree of shunt malfunction. Electronically Signed   By: Jasmine Pang M.D.   On: 03/11/2018 00:50   Ct Angio Chest Pe W Or Wo Contrast  Result Date: 03/16/2018 CLINICAL DATA:  Ct chest PE. He had audible congestion (chest/pharyngeal) at baseline which continues today. CXR ordered and is pending. Lethargy. EXAM: CT ANGIOGRAPHY CHEST WITH CONTRAST TECHNIQUE: Multidetector CT imaging of the chest was performed using the standard protocol during bolus administration of intravenous contrast. Multiplanar CT image reconstructions and MIPs were obtained to evaluate the vascular anatomy. CONTRAST:  ISOVUE-370 IOPAMIDOL (ISOVUE-370) INJECTION 76% COMPARISON:  Chest x-ray on 03/15/2018 FINDINGS: Cardiovascular: Pulmonary arteries  are moderately well opacified. There is significant streak artifact from arms at patient's side. There is no acute pulmonary embolus. Pulmonary artery is enlarged, 3.9 centimeters. The heart is mildly enlarged. Bovine arch anatomy. No significant thoracic aortic atherosclerosis or aneurysm. Mediastinum/Nodes: The visualized portion of the thyroid gland has a normal appearance. Esophagus is normal in appearance. No significant mediastinal, hilar, or axillary adenopathy. Lungs/Pleura: There are dense areas of airspace filling throughout the lungs bilaterally. Bilateral small pleural effusions. Upper Abdomen: No acute abnormality. Musculoskeletal: Degenerative changes are seen in thoracic spine. Bilateral gynecomastia. Review of the MIP images confirms the above findings. IMPRESSION: 1. Moderate opacification of pulmonary arteries. No acute pulmonary embolus. Enlargement of pulmonary artery, consistent arterial hypertension. 2. Dense focal areas of airspace filling throughout the lungs bilaterally, likely infectious. 3. Small bilateral pleural effusions. 4. Bovine arch anatomy incidentally noted. Electronically Signed   By: Norva Pavlov M.D.   On: 03/16/2018 10:42   Ct Abdomen Pelvis W Contrast  Result Date: 03/31/2018 CLINICAL DATA:  Fever of unknown origin. Green pus coming out from the urethra around the Foley catheter. EXAM: CT ABDOMEN AND PELVIS WITH CONTRAST TECHNIQUE: Multidetector CT imaging of the abdomen and pelvis was performed using the standard protocol following bolus administration of intravenous contrast. CONTRAST:  OMNIPAQUE IOHEXOL 300 MG/ML  SOLN COMPARISON:  03/14/2018 FINDINGS: Lower chest: Atelectasis or infiltration in both lung bases with small pleural effusions, improving since previous study. Hepatobiliary: There is a large stone in the gallbladder neck. No gallbladder wall thickening or inflammatory changes. No bile duct dilatation. No focal liver lesions. Pancreas:  Unremarkable. No pancreatic ductal dilatation or surrounding inflammatory changes. Spleen: Normal in size without focal abnormality. Adrenals/Urinary Tract: Enlarged left adrenal gland, possibly hyperplastic. Right ureteral stent with proximal pigtail in the upper pole, possibly extending into the parenchyma, and distal pigtail in the bladder. There is some residual hydronephrosis without hydroureter. Delayed right renal nephrogram and swelling of the right kidney with respect to the left may indicate residual abnormal renal function due either to obstruction or possibly infection. Stranding around the right ureter. Cyst in the right kidney as before. Left kidney and ureter are unremarkable. The bladder is decompressed with a Foley catheter in place. Stomach/Bowel: Stomach and small bowel are mostly decompressed. Contrast material is demonstrated in the distal small bowel and colon suggesting no evidence of obstruction. Colon is not abnormally distended. Diffusely stool-filled colon. Appendix is not identified. Vascular/Lymphatic: Aortic atherosclerosis. No enlarged abdominal or pelvic lymph nodes. Inferior vena caval filter is in place. Reproductive: Prostate gland is not enlarged. Other: No free air  or free fluid in the abdomen. Mild edema in the subcutaneous fat. Stranding and edema in the pelvis and around the right kidney and ureter. Tubing in the right upper quadrant, possibly representing the distal portion of ventricular peritoneal shunt. Musculoskeletal: Degenerative changes in the spine. No destructive bone lesions. Prominent degenerative changes in the hips. IMPRESSION: 1. Large stone in the gallbladder neck. No bile duct dilatation. No changes of cholecystitis. 2. Right ureteral stent with mild residual hydronephrosis. Delayed right renal nephrogram and swelling of the right kidney with respect to the left may indicate abnormal renal function due to obstruction or infection. 3. Stranding and edema in the  subcutaneous fat of the pelvis and around the right kidney and ureter. 4. Foley catheter decompresses the bladder. 5. Tubing in the right upper quadrant, possibly representing the distal portion of the ventricular peritoneal shunt. 6. Atelectasis or infiltration in both lung bases. 7. Inferior vena caval filter. Electronically Signed   By: Burman Nieves M.D.   On: 03/31/2018 01:36   Ct Abdomen Pelvis W Contrast  Result Date: 03/15/2018 CLINICAL DATA:  Right-sided abdominal pain/tenderness.  Sepsis. EXAM: CT ABDOMEN AND PELVIS WITH CONTRAST TECHNIQUE: Multidetector CT imaging of the abdomen and pelvis was performed using the standard protocol following bolus administration of intravenous contrast. CONTRAST:  OMNIPAQUE IOHEXOL 300 MG/ML  SOLN CONTRAST EXTRAVASATION CONSULTATION: Type of contrast:  Isovue 300 Site of extravasation: Left hand Estimated volume of extravasation: 50 ml Area of extravasation scanned with CT? Yes PATIENT'S SIGNS AND SYMPTOMS Skin blistering/ulceration: no Decrease capillary refill: no Change in skin color: no Decreased motor function or severe tightness: Tightness present at site of extravasation, however patient with contracture and hemiplegia throughout the left upper extremity Decreased pulses distal to site of extravasation: no Altered sensation: Patient nonresponsive, no secondary signs of pain. Increasing pain or signs of increased swelling during observation: Patient nonresponsive, no secondary signs of pain. TREATMENT Observation period at site: Inpatient to returned to the floor. Limb elevation: Requested per protocol Ice packs applied: Requested per protocol Heat pads applied: Requested per protocol Plastic surgery consulted? no DOCUMENTATION AND FOLLOW-UP Site contrast extravasation forms submitted? yes Post extravasation orders completed? yes Was additional follow up assigned to PA's? Yes Patient's questions answered? Patient nonresponsive Patient instructed to call  [phone] or seek immediate medical care if symptoms progress. COMPARISON:  Abdominal radiograph 03/12/2018, CT 03/11/2018 FINDINGS: Lower chest: Patchy opacities in the right middle lobe have increased from prior CT. Right greater than left lower lobe opacities with small pleural effusions are similar. Bilateral gynecomastia. Hepatobiliary: Lamellated gallstone in gallbladder, motion artifact at this level limits assessment for pericholecystic inflammation. No focal hepatic lesion. Pancreas: No ductal dilatation or inflammation. Spleen: Normal in size without focal abnormality. Adrenals/Urinary Tract: Left adrenal myelolipoma, unchanged. Right adrenal gland is normal. Interval placement of right ureteral stent, the proximal stent may extend into the renal parenchyma. Distal pigtail is within the urinary bladder, as seen on prior radiograph possible retained wire fragment in the distal aspect of the stent. Persistent but improved right hydroureteronephrosis with small 2 mm residual stone fragment at the ureteropelvic junction adjacent to the stent. Cyst in the anterior mid right kidney. No left hydronephrosis or obstructive uropathy. Urinary bladder is decompressed by Foley catheter. Stomach/Bowel: Stomach physiologically distended. No bowel wall thickening or inflammatory change. No bowel obstruction. Appendix not visualized, no evidence of appendicitis. Small to moderate colonic stool burden with stool distending the rectum. Vascular/Lymphatic: Mild aortic atherosclerosis. Infrarenal IVC filter in  place. Small retroperitoneal lymph nodes likely reactive. No enlarged abdominal or pelvic lymph nodes. Reproductive: Prostate is unremarkable. Other: Ventriculoperitoneal shunt catheter with tip in the left upper quadrant abutting the dome of the liver. No adjacent fluid collection. No free fluid or free air in the abdomen or pelvis. Subcutaneous edema and bilateral flanks with slight increase from prior exam.  Musculoskeletal: Bony fusion of the spinous processes of the spine. Bilateral hip osteoarthritis. Previously described soft tissue track posterior to the left hip is not included in the field of view. IMPRESSION: 1. Interval placement of right ureteral stent with small 2 mm stone fragment persisting at the right ureteropelvic junction. Proximal aspect of the stent may extend into the renal parenchyma. Possible retained wire in the distal aspect of the stent as seen on prior radiograph. Mild persistent but improved right hydronephrosis. 2. Gallstone within the gallbladder, motion artifact obscures detailed gallbladder evaluation. 3. Increased patchy and ground-glass opacities in the right middle lobe from recent CT, suggesting pneumonia or aspiration. Bibasilar atelectasis and small pleural effusions are similar. 4. Contrast extravasation within the left hand. Tightness in the region of the extravasation. Patient with left hemiplegia and arm contracture as well as unresponsive which limits clinical evaluation. Recommend close clinical follow-up, as degree of background changes in the region of extravasation is unknown. Electronically Signed   By: Rubye Oaks M.D.   On: 03/15/2018 00:37   Dg Retrograde Pyelogram  Result Date: 03/11/2018 CLINICAL DATA:  Right ureter stone. EXAM: RETROGRADE PYELOGRAM COMPARISON:  CT 03/11/2018 FINDINGS: Single retrograde pyelogram image was submitted. This appears to be an image of the right ureter and right renal collecting system. Image limited due to motion. There is a filling defect in the proximal right ureter that corresponds with the recent CT findings. IMPRESSION: Stone in the proximal right ureter. Electronically Signed   By: Richarda Overlie M.D.   On: 03/11/2018 07:41   Dg Chest Port 1 View  Result Date: 03/28/2018 CLINICAL DATA:  Fever. EXAM: PORTABLE CHEST 1 VIEW COMPARISON:  03/22/2018 and older exams. FINDINGS: Lung volumes remain low. There is opacity in the right  upper lobe associated with elevation of the minor fissure. This is likely combination of residual pneumonia and atelectasis. It has increased when compared to the most recent prior exam. There is mild streaky opacity at the lung bases that is likely due to atelectasis. Remainder of the lungs is clear. No pulmonary edema. No convincing pleural effusion.  No pneumothorax. Heart normal in size. IMPRESSION: 1. Increased opacity in the right upper lobe when compared to the most recent prior study. This is likely combination of atelectasis and pneumonia. Mid lung hazy opacities noted on the prior study have improved. 2. No other change. Mild basilar atelectasis is similar to the prior study. Electronically Signed   By: Amie Portland M.D.   On: 03/28/2018 10:14   Dg Chest Port 1 View  Result Date: 03/15/2018 CLINICAL DATA:  Fever EXAM: PORTABLE CHEST 1 VIEW COMPARISON:  03/12/2018 FINDINGS: Overall improved aeration especially in the lung bases. Progression of right upper lobe infiltrate. Improved bibasilar airspace disease. Improved lung volume. No effusion identified. Shunt tubing in the right chest again noted. IMPRESSION: Improved lung volume and improved aeration of the lungs bilaterally. Progression of right upper lobe infiltrate. Electronically Signed   By: Marlan Palau M.D.   On: 03/15/2018 11:57   Dg Chest Port 1 View  Result Date: 03/12/2018 CLINICAL DATA:  Acute in cephalopathy EXAM: PORTABLE CHEST  1 VIEW COMPARISON:  03/10/2018 FINDINGS: Cardiac shadow is stable. The overall inspiratory effort is poor with bibasilar atelectasis right greater than left. No bony abnormality is seen. VP shunt is noted as well as postsurgical change in the cervical spine. IMPRESSION: Poor inspiratory effort with bibasilar atelectasis right greater than left. Electronically Signed   By: Alcide Clever M.D.   On: 03/12/2018 08:22   Dg Chest Portable 1 View  Result Date: 03/10/2018 CLINICAL DATA:  Sepsis. EXAM: PORTABLE  CHEST 1 VIEW COMPARISON:  Radiograph of November 20, 2009. FINDINGS: Stable cardiomediastinal silhouette. No pneumothorax or pleural effusion is noted. Stable minimal bibasilar subsegmental atelectasis is noted. Right-sided ventriculoperitoneal shunt is again noted. Bony thorax is unremarkable. IMPRESSION: Stable minimal bibasilar subsegmental atelectasis. Electronically Signed   By: Lupita Raider, M.D.   On: 03/10/2018 19:54   Dg Swallowing Func-speech Pathology  Result Date: 03/17/2018 Objective Swallowing Evaluation: Type of Study: MBS-Modified Barium Swallow Study  Patient Details Name: Eulan Heyward MRN: [id number] Date of Birth: [birth year] Today's Date: 03/17/2018 Time: SLP Start Time (ACUTE ONLY): 0932 -SLP Stop Time (ACUTE ONLY): 0952 SLP Time Calculation (min) (ACUTE ONLY): 20 min Past Medical History: Past Medical History: Diagnosis Date . Depression  . Diabetes mellitus without complication (HCC)  . GAD (generalized anxiety disorder)  . Hypertension  . Hypothyroidism  . Spastic hemiplegia (HCC)  . Thyroid disease   hypothyroidism Past Surgical History: Past Surgical History: Procedure Laterality Date . CYSTOSCOPY W/ URETERAL STENT PLACEMENT Right 03/11/2018  Procedure: CYSTOSCOPY WITH RETROGRADE PYELOGRAM/DOUBLE J STENT PLACEMENT;  Surgeon: [name], MD;  Location: [facility] OR;  Service: Urology;  Laterality: Right; . VENTRICULOPERITONEAL SHUNT   HPI: Merwyn Hodapp is a 52 y.o. 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  Chest x-ray showed bilateral basilar subsegment atelectasis. Found to have multiple nonobstructing right renal calculi measuring up to 10 mm and underwent cystoscopy, right retrograde ureteropyelogram, placement of stent. MBS at [facility] end of 2018 he did  not aspirate or penetrate, dealyed swallow (thin liq's and appears regular texture recommended). Followed by ST, increased coughing with RN, ST downgraded to puree, continue thin. CXR 7/29 showed Dense focal areas of airspace filling throughout the lungs bilateral, likely infectious. SLP recommended MBS.  No data recorded Assessment / Plan / Recommendation CHL IP CLINICAL IMPRESSIONS 03/17/2018 Clinical Impression Pt exhibits decreased oral manipulation, coordination and cohesion with thin and puree. Reduced tongue base retection led to premature spill of thin to pyriform sinuses and likely delayed timing and closure of laryngeal vestibule with penetration of thin via straw and large sip. Difficult to fully view with suboptimal positioning and reclined posture in chair from pain and shoulder obstructing view somewhat. Suspect penetration was flash or ejected with pt's spontaneous cough and vestibule and area below vocal cords was clear. Smaller sips aided in increased control and protection with thin. Recommend pt continue Dys 1 (puree), thin, straws allowed with SMALL sips, full supervision, small sips and crush meds. Continue to follow.   SLP Visit Diagnosis Dysphagia, oropharyngeal phase (R13.12) Attention and concentration deficit following -- Frontal lobe and executive function deficit following -- Impact on safety and function Mild aspiration risk;Moderate aspiration risk   CHL IP TREATMENT RECOMMENDATION 03/17/2018 Treatment Recommendations Therapy as outlined in treatment plan below   Prognosis 03/17/2018 Prognosis for Safe Diet Advancement Good Barriers to Reach Goals  Cognitive deficits Barriers/Prognosis Comment -- CHL IP DIET RECOMMENDATION 03/17/2018 SLP Diet Recommendations Thin liquid;Dysphagia 1 (Puree) solids Liquid Administration via Cup;Straw Medication Administration Crushed with puree Compensations Minimize environmental distractions Postural Changes Remain semi-upright after after feeds/meals  (Comment)   CHL IP OTHER RECOMMENDATIONS 03/17/2018 Recommended Consults -- Oral Care Recommendations Oral care BID Other Recommendations --   CHL IP FOLLOW UP RECOMMENDATIONS 03/17/2018 Follow up Recommendations None   CHL IP FREQUENCY AND DURATION 03/17/2018 Speech Therapy Frequency (ACUTE ONLY) min 2x/week Treatment Duration 2 weeks      CHL IP ORAL PHASE 03/17/2018 Oral Phase Impaired Oral - Pudding Teaspoon -- Oral - Pudding Cup -- Oral - Honey Teaspoon -- Oral - Honey Cup -- Oral - Nectar Teaspoon -- Oral - Nectar Cup -- Oral - Nectar Straw -- Oral - Thin Teaspoon -- Oral - Thin Cup Premature spillage;Decreased bolus cohesion Oral - Thin Straw Decreased bolus cohesion Oral - Puree Delayed oral transit;Lingual/palatal residue Oral - Mech Soft -- Oral - Regular -- Oral - Multi-Consistency -- Oral - Pill -- Oral Phase - Comment --  CHL IP PHARYNGEAL PHASE 03/17/2018 Pharyngeal Phase Impaired Pharyngeal- Pudding Teaspoon -- Pharyngeal -- Pharyngeal- Pudding Cup -- Pharyngeal -- Pharyngeal- Honey Teaspoon -- Pharyngeal -- Pharyngeal- Honey Cup -- Pharyngeal -- Pharyngeal- Nectar Teaspoon -- Pharyngeal -- Pharyngeal- Nectar Cup -- Pharyngeal -- Pharyngeal- Nectar Straw -- Pharyngeal -- Pharyngeal- Thin Teaspoon -- Pharyngeal -- Pharyngeal- Thin Cup Reduced tongue base retraction;Delayed swallow initiation-pyriform sinuses;Delayed swallow initiation-vallecula Pharyngeal -- Pharyngeal- Thin Straw Delayed swallow initiation-vallecula;Delayed swallow initiation-pyriform sinuses;Penetration/Aspiration during swallow Pharyngeal Material enters airway, remains ABOVE vocal cords then ejected out Pharyngeal- Puree Delayed swallow initiation-vallecula Pharyngeal -- Pharyngeal- Mechanical Soft -- Pharyngeal -- Pharyngeal- Regular -- Pharyngeal -- Pharyngeal- Multi-consistency -- Pharyngeal -- Pharyngeal- Pill -- Pharyngeal -- Pharyngeal Comment --  CHL IP CERVICAL ESOPHAGEAL PHASE 03/17/2018 Cervical Esophageal Phase WFL Pudding  Teaspoon -- Pudding Cup -- Honey Teaspoon -- Honey Cup -- Nectar Teaspoon -- Nectar Cup -- Nectar Straw -- Thin Teaspoon -- Thin Cup -- Thin Straw -- Puree -- Mechanical Soft -- Regular -- Multi-consistency -- Pill -- Cervical Esophageal Comment -- Royce Macadamia 03/17/2018, 2:22 PM               Labs:  CBC: Recent Labs    03/28/18 0407 03/29/18 0340 03/30/18 0311 03/31/18 0344  WBC 11.5* 13.5* 13.4* 11.2*  HGB 7.4* 7.0* 7.8* 6.7*  HCT 25.1* 23.7* 28.0* 22.7*  PLT 316 357 252 267    COAGS: Recent Labs    03/10/18 2332 03/11/18 0137  INR 1.41  --   APTT  --  35    BMP: Recent Labs    03/28/18 0407 03/29/18 0340 03/30/18 0311 03/31/18 0344  NA 141 143 146* 144  K 3.7 3.9 4.8 3.4*  CL 106 108 109 109  CO2 27 30 27 30   GLUCOSE 131* 147* 98 121*  BUN 19 19 23* 16  CALCIUM 7.5* 7.6* 7.8* 7.7*  CREATININE 0.83 0.80 0.93 0.73  GFRNONAA >60 >60 >60 >60  GFRAA >60 >60 >60 >60    LIVER FUNCTION TESTS: Recent Labs    03/15/18 0504 03/20/18 0744 03/27/18 0507 03/31/18 0344  BILITOT 0.9 0.3 0.6 0.4  AST 39 25 27 20   ALT 24 20 19 15   ALKPHOS 102 96 96 85  PROT 7.3 7.6 8.4* 8.5*  ALBUMIN 1.6* 1.6* 1.5* 1.4*    TUMOR MARKERS: No results for input(s): AFPTM, CEA, CA199, CHROMGRNA in the  last 8760 hours.  Assessment and Plan:  Rt hydronephrosis JJ stent placed 7/25 with Urology-- obstructive uropathy/stone Sepsis; fever Stent with need for possible replacement-- Uro asking for PCN for now Scheduled now for same Risks and benefits of Rt percutaneous nephrostomy drain placement were discussed with the patient's sister [name] via phone including, but not limited to, infection, bleeding, significant bleeding causing loss or decrease in renal function or damage to adjacent structures.   All of her questions were answered, she is agreeable to proceed.  Consent signed and in chart.  Thank you for this interesting consult.  I greatly enjoyed meeting Dereon Corkery and look forward to participating in their care.  A copy of this report was sent to the requesting provider on this date.  Electronically Signed: Robet Leu, PA-C 03/31/2018, 12:20 PM   I spent a total of 40 Minutes    in face to face in clinical consultation, greater than 50% of which was counseling/coordinating care for Rt PCN

## 2018-03-31 NOTE — Sedation Documentation (Signed)
Pt stable at this time. Pt very agitated

## 2018-03-31 NOTE — Sedation Documentation (Signed)
Change of RN's

## 2018-03-31 NOTE — Sedation Documentation (Signed)
Patient is resting comfortably. 

## 2018-03-31 NOTE — Procedures (Addendum)
  Pre-operative Diagnosis: Right hydronephrosis with history of sepsis and fever.  History of right ureter stone and ureter stent placement.          Post-operative Diagnosis: Right hydronephrosis    Indications: Right hydronephrosis with history of sepsis and fever. Right ureter stent does not appear to functioning properly.    Procedure: Percutaneous right nephrostomy tube placement  Findings: Mild-moderate hydronephrosis on US.  Initially, access was obtained from upper pole and 10 Fr drain was placed.  However, this drain was NOT decompressing dilated calyces in lower and mid pole.  In addition, the nephrostomy tube would not coil into collecting system.  During manipulation of the drain, access was lost.  Mid and lower calyces were punctured with 22 Chiba needles but it was difficult to advance wire into renal pelvis (possibly related to existing ureter stent).  Eventually, wire was advanced into renal pelvis and ureter from mid/upper pole calyx.  A Dawson-Mueller 10 Fr drain was placed in renal pelvis and attached to gravity bag.  Complications: None     EBL: Minimal  Plan: Follow drain output.  Will probably need Urology to remove stent because manipulation within the collecting system may not be possible.

## 2018-03-31 NOTE — Progress Notes (Signed)
Notified MD Hgb 6.7

## 2018-04-01 ENCOUNTER — Encounter (HOSPITAL_COMMUNITY): Payer: Self-pay | Admitting: Diagnostic Radiology

## 2018-04-01 LAB — BASIC METABOLIC PANEL
Anion gap: 6 (ref 5–15)
BUN: 14 mg/dL (ref 6–20)
CALCIUM: 7.6 mg/dL — AB (ref 8.9–10.3)
CO2: 26 mmol/L (ref 22–32)
CREATININE: 0.74 mg/dL (ref 0.61–1.24)
Chloride: 114 mmol/L — ABNORMAL HIGH (ref 98–111)
Glucose, Bld: 121 mg/dL — ABNORMAL HIGH (ref 70–99)
Potassium: 4.2 mmol/L (ref 3.5–5.1)
SODIUM: 146 mmol/L — AB (ref 135–145)

## 2018-04-01 LAB — TYPE AND SCREEN
ABO/RH(D): O POS
Antibody Screen: NEGATIVE
Unit division: 0

## 2018-04-01 LAB — CBC
HCT: 29.4 % — ABNORMAL LOW (ref 39.0–52.0)
Hemoglobin: 8.8 g/dL — ABNORMAL LOW (ref 13.0–17.0)
MCH: 26.3 pg (ref 26.0–34.0)
MCHC: 29.9 g/dL — ABNORMAL LOW (ref 30.0–36.0)
MCV: 87.8 fL (ref 78.0–100.0)
PLATELETS: 268 10*3/uL (ref 150–400)
RBC: 3.35 MIL/uL — ABNORMAL LOW (ref 4.22–5.81)
RDW: 17.2 % — AB (ref 11.5–15.5)
WBC: 13.1 10*3/uL — ABNORMAL HIGH (ref 4.0–10.5)

## 2018-04-01 LAB — BPAM RBC
Blood Product Expiration Date: 201909092359
ISSUE DATE / TIME: 201908140936
Unit Type and Rh: 5100

## 2018-04-01 LAB — GLUCOSE, CAPILLARY
GLUCOSE-CAPILLARY: 94 mg/dL (ref 70–99)
GLUCOSE-CAPILLARY: 164 mg/dL — AB (ref 70–99)
Glucose-Capillary: 110 mg/dL — ABNORMAL HIGH (ref 70–99)
Glucose-Capillary: 144 mg/dL — ABNORMAL HIGH (ref 70–99)

## 2018-04-01 LAB — MAGNESIUM: MAGNESIUM: 1.8 mg/dL (ref 1.7–2.4)

## 2018-04-01 MED ORDER — IOPAMIDOL (ISOVUE-300) INJECTION 61%: 50.0000 mL | Freq: Once | INTRAVENOUS | Status: DC | PRN

## 2018-04-01 NOTE — Progress Notes (Signed)
CSW continuing to follow for disposition planning, pending medical readiness.  Abigail ButtsSusan Shaw Dobek, LCSWA [phone]

## 2018-04-01 NOTE — Progress Notes (Signed)
PROGRESS NOTE    Cody [name]  WJX:[id number] DOB: Oct 27, 1965 DOA: 03/10/2018 PCP: [name], MD   Brief Narrative:  52 year old male TBI, spastic left hemiplegia DM 2, hypothyroid, bipolar. Admitted 03/11/2018 increasing lethargy X 48 hours. Admitted with sepsis protocol started-found to have AKI creatinine 2.1 BUN 40 T-max 102.6. WBC 15 CT abdomen pelvis right obstructive uropathy 7X5 UVJ stone. CT head = extensive encephalomalacia slight interval enlargement of the ventricular system compared to 2009 CT. Presumed sepsis from acute pyelonephritis started on Vanco Zosyn given 4 L of fluid and kept on stepdown unit. Urologist was consulted underwent cystoscopy and placement of 6 French X 24 cm JJ stent 7/25. Hospitalization complicated by persisting fever and sepsis.  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  Nephrology was consulted.  Assessment & Plan:   Principal Problem:   Acute pyelonephritis Active Problems:   Depression   Diabetes mellitus without complication (HCC)   Hypertension   Spastic hemiplegia (HCC)   Sepsis (HCC)   AKI (acute kidney injury) (HCC)   Hydroureteronephrosis-right   Ureterovesical junction (UVJ) obstruction   Acute metabolic encephalopathy   Acute respiratory failure with hypoxia (HCC)   Pressure injury of skin   Right kidney stone   Hypernatremia   Aspiration into airway   Palliative care encounter   Diabetes insipidus (HCC)   Sepsis likely multifactorial  Persistent fever Pt with R obsteructive uropathy with 7 x 5 mm R ureterovesical junction stone causing hydroureteronephrosis and perinephric stranding (with multiple nonobstructing calculi measuring up to 10 mm) S/p cystoscopy with R retrograde ureteropyelogram, fluoscopic interpretation, placement of 6 french by 24 cm contour double J stent without tether on 7/25 Pt with recurrent fevers and infectious disease was consulted due to fever without Tanecia Mccay clear source.   He was  started on unasyn for possible aspiration pneumonia and then transitioned to augmentin.  He has completed course of treatment.  Patient continued to have fevers. Some concern for drug fever? Recurrent fever 8/15, will continue to monitor, pt stable.  If recurrent will repeat cultures and imaging.  Blood cx from 7/28 NGTD as well as 8/6 NGTD.   Repeat urine culture from 8/8 with proteus sensitive to ceftriaxone. Repeat blood cx from 8/11 NGTD x2 days  Repeat urine cx from 8/11 again growing proteus  CXR from 8/11 with opacity in RUL thought 2/2 atelectasis and pneumonia CXR from 8/13 with persistent pneumonia in RUL Start ceftriaxone for proteus UTI.  Will hold off on treating pneumonia as pt doing well on RA and was treated earlier in hospitalization for this. Discussed with urology and ordered CT which was notable for R ureteral stent with mild residual hydronephrosis, delayed R renal nephrogram and swelling of R kidney with respect to L may indicate abnormal renal function 2/2 obstruction or infection - urology recommended perc nephrostomy tube placement Now s/p R perc nephrostomy tube (see note, difficult placement - will discuss with urology timing of stent removal) PCT downtrending  Severe hypernatremia thought to be secondary to central diabetes insipidus/acute kidney injury Fluctuating, follow Patient was seen by nephrology.   He was placed on desmopressin 20 mcg nasal BID and dextrose infusion.   Sodium level has improved  He has been tolerating his diet and water.   Discussed with nursing, encourage PO free water intake, at least 250 cc PO three times in between meals (and at least 250 cc 3 times day with meals) He will need continued encouragement to consume water even at  skilled nursing facility.  We will continue with current dose of desmopressin intranasally.   Will need periodic monitoring of his sodium levels.   If he develops hypernatremia despite these measures then further  discussions will need to be held with family as the only way to provide him free water than would be to place Roi Jafari PEG tube (discussed with sister this possibility today)  Cholelithiasis: seen on imaging, doesn't seem to have any abdominal pain.  Normal LFT's.  But large stone in gallbladder neck.  No evidence of cholecystitis.  Follow.   History of traumatic brain injury with left hemiplegia Patient is stable.  Patient has Tysean Vandervliet shunt.  This was discussed with neurosurgery who did not feel like his symptoms were secondary to shunt malfunction.  Neurologically he appears to have improved.  He is probably close to his baseline.  Per neurosurgery, consider shunt evaluation after abx.   Chronic aspiration/oropharyngeal dysphagia He is on dysphagia 1 diet.  Speech therapy has seen.  Remains at high risk for recurrent aspirations.  Palliative medicine has been following.   History of bipolar disorder He is on medications which are being continued.  Stage 3 pressure injury  Noted in the right posterior thigh and left buttock area and left heel.  Wound care.  No active infection appreciated.  Continue frequent turns.  Diabetes mellitus type 2 Continue long-acting insulin and SSI.  BG's stable.  Acute metabolic encephalopathy Most likely secondary to acute infection.  Appears close to baseline.  Stable.  Acute respiratory failure with hypoxia Secondary to aspiration.  Resolved.  No PE noted on CT angiogram done on 7/29.  Normocytic anemia Received transfusion for hb of 6.7 8/14 -> improved to 8.8 today.   Start iron No evidence of bleeding, follow  Essential hypertension Blood pressure is well controlled.  Patient is on metoprolol.  Obstructive uropathy Status post stent placement -> now s/p perc neph tube placed on 8/14   Imaging studies did suggest Coralyn Roselli retained guidewire as well as proximal aspect of stent may be in renal parenchyma.   Urology is aware of this.   Follow-up with  urology after discharge.  Will perform trial of void today.  Nonsustained Vtach: follow, no echo to eval EF.  Will continue to monitor.    Hypokalemia: replete, follow  DVT prophylaxis: lovenox Code Status: full  Family Communication: discussed with sister 8/14 Disposition Plan: pending improvement   Consultants:   Urology  Neurosurgery  ID  Nephrology  Palliative  Procedures:  On 7/25: Cystoscopy, right retrograde ureteropyelogram, fluoroscopic interpretation, placement of 6 French by 24 cm contour double-J stent without tether  Lower extremity Doppler study  Negative for DVT  8/14 R perc nephrostomy tube   Antimicrobials: Anti-infectives (From admission, onward)   Start     Dose/Rate Route Frequency Ordered Stop   03/31/18 1330  ciprofloxacin (CIPRO) IVPB 400 mg     400 mg 200 mL/hr over 60 Minutes Intravenous To Radiology 03/31/18 1236 03/31/18 1830   03/29/18 1430  cefTRIAXone (ROCEPHIN) 1 g in sodium chloride 0.9 % 100 mL IVPB     1 g 200 mL/hr over 30 Minutes Intravenous Every 24 hours 03/29/18 1430     03/19/18 1400  amoxicillin-clavulanate (AUGMENTIN) 875-125 MG per tablet 1 tablet     1 tablet Oral Every 12 hours 03/19/18 0911 03/23/18 2224   03/18/18 1700  piperacillin-tazobactam (ZOSYN) IVPB 3.375 g  Status:  Discontinued     3.375 g 12.5 mL/hr over 240  Minutes Intravenous Every 8 hours 03/18/18 1343 03/19/18 0910   03/17/18 1400  Ampicillin-Sulbactam (UNASYN) 3 g in sodium chloride 0.9 % 100 mL IVPB  Status:  Discontinued     3 g 200 mL/hr over 30 Minutes Intravenous Every 6 hours 03/17/18 1300 03/18/18 1337   03/14/18 1430  ceFEPIme (MAXIPIME) 2 g in sodium chloride 0.9 % 100 mL IVPB  Status:  Discontinued     2 g 200 mL/hr over 30 Minutes Intravenous Every 24 hours 03/14/18 1136 03/15/18 0956   03/14/18 0100  vancomycin (VANCOCIN) 500 mg in sodium chloride 0.9 % 100 mL IVPB  Status:  Discontinued     500 mg 100 mL/hr over 60 Minutes Intravenous  Every 12 hours 03/13/18 1510 03/15/18 0956   03/11/18 2000  vancomycin (VANCOCIN) IVPB 1000 mg/200 mL premix  Status:  Discontinued     1,000 mg 200 mL/hr over 60 Minutes Intravenous Every 24 hours 03/10/18 2053 03/11/18 1342   03/11/18 1400  vancomycin (VANCOCIN) IVPB 750 mg/150 ml premix  Status:  Discontinued     750 mg 150 mL/hr over 60 Minutes Intravenous Every 12 hours 03/11/18 1342 03/13/18 1510   03/11/18 0400  piperacillin-tazobactam (ZOSYN) IVPB 3.375 g  Status:  Discontinued     3.375 g 12.5 mL/hr over 240 Minutes Intravenous Every 8 hours 03/10/18 2053 03/14/18 1136   03/10/18 2030  piperacillin-tazobactam (ZOSYN) IVPB 3.375 g     3.375 g 100 mL/hr over 30 Minutes Intravenous  Once 03/10/18 2023 03/10/18 2139   03/10/18 2030  vancomycin (VANCOCIN) IVPB 1000 mg/200 mL premix     1,000 mg 200 mL/hr over 60 Minutes Intravenous  Once 03/10/18 2023 03/10/18 2208     Subjective: Mumbling incoherently. Talking more than previous days.  Objective: Vitals:   04/01/18 0747 04/01/18 1127 04/01/18 1342 04/01/18 1355  BP: (!) 143/88 (!) 164/97 132/85   Pulse: 94 (!) 107 (!) 101   Resp: (!) 26  (!) 28   Temp: 98.6 F (37 C)  99.5 F (37.5 C) (!) 101.4 F (38.6 C)  TempSrc: Axillary  Axillary Rectal  SpO2: 100%  100%   Weight:      Height:        Intake/Output Summary (Last 24 hours) at 04/01/2018 1644 Last data filed at 04/01/2018 1417 Gross per 24 hour  Intake 3125 ml  Output 2625 ml  Net 500 ml   Filed Weights   03/30/18 0616 03/31/18 0638 04/01/18 [id number]  Weight: 58.1 kg 63 kg 55.8 kg    Examination:  General: No acute distress. Cardiovascular: Heart sounds show Shonna Deiter regular rate, and rhythm. Lungs: Clear to auscultation bilaterally with good air movement.  Abdomen: Soft, nontender, nondistended R perc nephrostomy tube with red tinged yellow fluid Neurological: Alert and disoriented. Cranial nerves II through XII grossly intact. Skin: decubitus ulcers not  visualized today Extremities: No clubbing or cyanosis. No edema.   Data Reviewed: I have personally reviewed following labs and imaging studies  CBC: Recent Labs  Lab 03/29/18 0340 03/30/18 0311 03/31/18 0344 03/31/18 1616 04/01/18 0432  WBC 13.5* 13.4* 11.2* 12.1* 13.1*  NEUTROABS  --   --   --  6.9  --   HGB 7.0* 7.8* 6.7* 7.7* 8.8*  HCT 23.7* 28.0* 22.7* 25.8* 29.4*  MCV 87.5 94.3 87.6 88.1 87.8  PLT 357 252 267 257 268   Basic Metabolic Panel: Recent Labs  Lab 03/28/18 0407 03/29/18 0340 03/30/18 0311 03/31/18 0344 04/01/18 0432  NA 141  143 146* 144 146*  K 3.7 3.9 4.8 3.4* 4.2  CL 106 108 109 109 114*  CO2 27 30 27 30 26   GLUCOSE 131* 147* 98 121* 121*  BUN 19 19 23* 16 14  CREATININE 0.83 0.80 0.93 0.73 0.74  CALCIUM 7.5* 7.6* 7.8* 7.7* 7.6*  MG  --   --  1.9 2.0 1.8   GFR: Estimated Creatinine Clearance: 86.2 mL/min (by C-G formula based on SCr of 0.74 mg/dL). Liver Function Tests: Recent Labs  Lab 03/27/18 0507 03/31/18 0344  AST 27 20  ALT 19 15  ALKPHOS 96 85  BILITOT 0.6 0.4  PROT 8.4* 8.5*  ALBUMIN 1.5* 1.4*   No results for input(s): LIPASE, AMYLASE in the last 168 hours. No results for input(s): AMMONIA in the last 168 hours. Coagulation Profile: Recent Labs  Lab 03/31/18 1616  INR 1.29   Cardiac Enzymes: No results for input(s): CKTOTAL, CKMB, CKMBINDEX, TROPONINI in the last 168 hours. BNP (last 3 results) No results for input(s): PROBNP in the last 8760 hours. HbA1C: No results for input(s): HGBA1C in the last 72 hours. CBG: Recent Labs  Lab 03/31/18 1157 03/31/18 2332 04/01/18 0746 04/01/18 1117 04/01/18 1629  GLUCAP 105* 120* 94 110* 144*   Lipid Profile: No results for input(s): CHOL, HDL, LDLCALC, TRIG, CHOLHDL, LDLDIRECT in the last 72 hours. Thyroid Function Tests: No results for input(s): TSH, T4TOTAL, FREET4, T3FREE, THYROIDAB in the last 72 hours. Anemia Panel: Recent Labs    03/31/18 0742  VITAMINB12 1,098*   FOLATE 14.4  FERRITIN 411*  TIBC 165*  IRON 27*   Sepsis Labs: Recent Labs  Lab 03/29/18 0340 03/30/18 0311 03/31/18 0344  PROCALCITON 0.21 0.34 0.23    Recent Results (from the past 240 hour(s))  Culture, blood (routine x 2)     Status: None   Collection Time: 03/23/18 12:35 AM  Result Value Ref Range Status   Specimen Description BLOOD LEFT WRIST  Final   Special Requests   Final    BOTTLES DRAWN AEROBIC ONLY Blood Culture results may not be optimal due to an inadequate volume of blood received in culture bottles   Culture   Final    NO GROWTH 5 DAYS Performed at [facility], [address] [place], [place] [address]    Report Status 03/28/2018 FINAL  Final  Culture, blood (routine x 2)     Status: None   Collection Time: 03/23/18 12:40 AM  Result Value Ref Range Status   Specimen Description BLOOD LEFT HAND  Final   Special Requests   Final    BOTTLES DRAWN AEROBIC ONLY Blood Culture results may not be optimal due to an inadequate volume of blood received in culture bottles   Culture   Final    NO GROWTH 5 DAYS Performed at [facility], [address] [place], [place] [address]    Report Status 03/28/2018 FINAL  Final  Culture, Urine     Status: Abnormal   Collection Time: 03/25/18  9:30 AM  Result Value Ref Range Status   Specimen Description URINE, RANDOM  Final   Special Requests   Final    NONE Performed at [facility], [address], [place], [place] [address]    Culture >=100,000 COLONIES/mL PROTEUS MIRABILIS (Jonerik Sliker)  Final   Report Status 03/28/2018 FINAL  Final   Organism ID, Bacteria PROTEUS MIRABILIS (Amiah Frohlich)  Final      Susceptibility   Proteus mirabilis -  MIC*    AMPICILLIN >=32 RESISTANT Resistant     CEFAZOLIN 8 SENSITIVE Sensitive     CEFTRIAXONE <=1 SENSITIVE Sensitive     CIPROFLOXACIN >=4 RESISTANT Resistant     GENTAMICIN <=1 SENSITIVE Sensitive     IMIPENEM 2 SENSITIVE Sensitive     NITROFURANTOIN 256 RESISTANT Resistant      TRIMETH/SULFA >=320 RESISTANT Resistant     AMPICILLIN/SULBACTAM 16 INTERMEDIATE Intermediate     PIP/TAZO <=4 SENSITIVE Sensitive     * >=100,000 COLONIES/mL PROTEUS MIRABILIS  Culture, blood (Routine X 2) w Reflex to ID Panel     Status: None (Preliminary result)   Collection Time: 03/28/18 10:40 AM  Result Value Ref Range Status   Specimen Description BLOOD LEFT HAND  Final   Special Requests   Final    BOTTLES DRAWN AEROBIC ONLY Blood Culture results may not be optimal due to an inadequate volume of blood received in culture bottles   Culture   Final    NO GROWTH 4 DAYS Performed at [facility], [address] [place], [place] [address]    Report Status PENDING  Incomplete  Culture, blood (Routine X 2) w Reflex to ID Panel     Status: None (Preliminary result)   Collection Time: 03/28/18 10:48 AM  Result Value Ref Range Status   Specimen Description BLOOD LEFT HAND  Final   Special Requests   Final    BOTTLES DRAWN AEROBIC ONLY Blood Culture adequate volume   Culture   Final    NO GROWTH 4 DAYS Performed at [facility], [address], [place], [place] [address]    Report Status PENDING  Incomplete  Culture, Urine     Status: Abnormal   Collection Time: 03/28/18 11:14 AM  Result Value Ref Range Status   Specimen Description URINE, CATHETERIZED  Final   Special Requests   Final    NONE Performed at [facility], [address], [place], [place] [address]    Culture >=100,000 COLONIES/mL PROTEUS MIRABILIS (Tyrone Balash)  Final   Report Status 03/30/2018 FINAL  Final   Organism ID, Bacteria PROTEUS MIRABILIS (Jilian West)  Final      Susceptibility   Proteus mirabilis - MIC*    AMPICILLIN >=32 RESISTANT Resistant     CEFAZOLIN 8 SENSITIVE Sensitive     CEFTRIAXONE <=1 SENSITIVE Sensitive     CIPROFLOXACIN >=4 RESISTANT Resistant     GENTAMICIN <=1 SENSITIVE Sensitive     IMIPENEM 2 SENSITIVE Sensitive     NITROFURANTOIN 128 RESISTANT Resistant     TRIMETH/SULFA  >=320 RESISTANT Resistant     AMPICILLIN/SULBACTAM 16 INTERMEDIATE Intermediate     PIP/TAZO <=4 SENSITIVE Sensitive     * >=100,000 COLONIES/mL PROTEUS MIRABILIS         Radiology Studies: Dg Chest 2 View  Result Date: 03/30/2018 CLINICAL DATA:  Fever EXAM: CHEST - 2 VIEW COMPARISON:  March 28, 2018 FINDINGS: There is persistent airspace consolidation in the right upper lobe with volume loss. There is atelectatic change in the left base. Heart is mildly enlarged with pulmonary venous hypertension. No adenopathy. No appreciable bone lesions. IMPRESSION: Persistent pneumonia right upper lobe. Left base atelectasis. Pulmonary vascular congestion present. Electronically Signed   By: Bretta Bang III M.D.   On: 03/30/2018 21:27   Ct Abdomen Pelvis W Contrast  Result Date: 03/31/2018 CLINICAL DATA:  Fever of unknown origin. Green pus coming out from the urethra around the Foley catheter. EXAM:  CT ABDOMEN AND PELVIS WITH CONTRAST TECHNIQUE: Multidetector CT imaging of the abdomen and pelvis was performed using the standard protocol following bolus administration of intravenous contrast. CONTRAST:  OMNIPAQUE IOHEXOL 300 MG/ML  SOLN COMPARISON:  03/14/2018 FINDINGS: Lower chest: Atelectasis or infiltration in both lung bases with small pleural effusions, improving since previous study. Hepatobiliary: There is Onyx Edgley large stone in the gallbladder neck. No gallbladder wall thickening or inflammatory changes. No bile duct dilatation. No focal liver lesions. Pancreas: Unremarkable. No pancreatic ductal dilatation or surrounding inflammatory changes. Spleen: Normal in size without focal abnormality. Adrenals/Urinary Tract: Enlarged left adrenal gland, possibly hyperplastic. Right ureteral stent with proximal pigtail in the upper pole, possibly extending into the parenchyma, and distal pigtail in the bladder. There is some residual hydronephrosis without hydroureter. Delayed right renal nephrogram and  swelling of the right kidney with respect to the left may indicate residual abnormal renal function due either to obstruction or possibly infection. Stranding around the right ureter. Cyst in the right kidney as before. Left kidney and ureter are unremarkable. The bladder is decompressed with Kaliopi Blyden Foley catheter in place. Stomach/Bowel: Stomach and small bowel are mostly decompressed. Contrast material is demonstrated in the distal small bowel and colon suggesting no evidence of obstruction. Colon is not abnormally distended. Diffusely stool-filled colon. Appendix is not identified. Vascular/Lymphatic: Aortic atherosclerosis. No enlarged abdominal or pelvic lymph nodes. Inferior vena caval filter is in place. Reproductive: Prostate gland is not enlarged. Other: No free air or free fluid in the abdomen. Mild edema in the subcutaneous fat. Stranding and edema in the pelvis and around the right kidney and ureter. Tubing in the right upper quadrant, possibly representing the distal portion of ventricular peritoneal shunt. Musculoskeletal: Degenerative changes in the spine. No destructive bone lesions. Prominent degenerative changes in the hips. IMPRESSION: 1. Large stone in the gallbladder neck. No bile duct dilatation. No changes of cholecystitis. 2. Right ureteral stent with mild residual hydronephrosis. Delayed right renal nephrogram and swelling of the right kidney with respect to the left may indicate abnormal renal function due to obstruction or infection. 3. Stranding and edema in the subcutaneous fat of the pelvis and around the right kidney and ureter. 4. Foley catheter decompresses the bladder. 5. Tubing in the right upper quadrant, possibly representing the distal portion of the ventricular peritoneal shunt. 6. Atelectasis or infiltration in both lung bases. 7. Inferior vena caval filter. Electronically Signed   By: Burman Nieves M.D.   On: 03/31/2018 01:36   Ir US Guide Vasc Access Left  Result Date:  04/01/2018 INDICATION: 52 year old with history of Lachina Salsberry right ureter stone and right ureter stent placement. The patient has residual hydronephrosis and concern for infection. Request for percutaneous nephrostomy tube placement. EXAM: PLACEMENT OF RIGHT PERCUTANEOUS NEPHROSTOMY TUBE WITH ULTRASOUND AND FLUOROSCOPIC GUIDANCE PLACEMENT OF PERIPHERAL IV BY MD WITH ULTRASOUND COMPARISON:  None. MEDICATIONS: Inpatient on antibiotics. ANESTHESIA/SEDATION: Fentanyl 100 mcg IV; Versed 2.0 mg IV Moderate Sedation Time:  1 hour and 50 minutes The patient was continuously monitored during the procedure by the interventional radiology nurse under my direct supervision. CONTRAST:  30 mL-administered into the collecting system(s) FLUOROSCOPY TIME:  Fluoroscopy Time: 23 minutes 6 seconds (120 mGy). COMPLICATIONS: None immediate. PROCEDURE: Informed consent was obtained for right percutaneous nephrostomy tube. All questions were addressed. Revella Shelton timeout was performed prior to the initiation of the procedure. Patient was placed prone on the interventional table. Unfortunately, the patient lost IV access at this time. The patient had to  be taken off the table. Patient has poor venous access. Efrain Clauson patent superficial vein in the left forearm was identified with ultrasound. Ultrasound image was taken and saved for documentation tourniquet was placed. The skin was prepped with chlorhexidine and Taylyn Brame 20 gauge peripheral IV was placed in the superficial vein using ultrasound guidance. IV flushed and was secured to the skin. Patient was placed back on the table in Calbert Hulsebus prone position. The right flank was prepped and draped in Saif Peter sterile fashion. Maximal barrier sterile technique was utilized including caps, mask, sterile gowns, sterile gloves, sterile drape, hand hygiene and skin antiseptic. 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 Right kidney was identified with ultrasound but difficult to access the mid or lower pole with  ultrasound due to the iliac crest. The skin was anesthetized with 1% lidocaine. 21 gauge needle directed into the upper pole collecting system with ultrasound and clear urine was draining. Wire was advanced into the collecting system and Accustick dilator set was placed with difficulty. Thurmon Mizell stiff Amplatz wire was placed and Haylynn Pha 10 [place] drain was advanced over the wire into the collecting system. However, the catheter would not coil into the renal pelvis due to the small size of the pelvis and the existing ureter stent. The catheter was removed over wire in order to improve positioning. Unfortunately, the retention suture on the drain wrapped around the wire. As the retention suture was cut and released, the suture apparently pulled out the wire and access was lost into the right kidney. Small amount of contrast had been injected into the collecting system during placement and there was still retained contrast within the mid and lower calices. These calices were targeted with fluoroscopic guidance using 22 gauge needles. Initially Macedonio Scallon mid/upper pole calyx was cannulated and contrast injection confirmed placement in the collecting system. However, it was very difficult to advance Nihar Klus wire into the renal pelvis. Therefore access was attempted from Selyna Klahn dilated lower pole calyx. Again, the needle was advanced into the collecting system but wire could not be advanced into the pelvis. Eventually, wire was advanced through the mid/upper pole calyx into the renal pelvis. Accustick dilator set was placed and eventually Rocquel Askren 10 French Renee Pain catheter was placed and reconstituted within the renal pelvis. Contrast injection confirmed placement in the renal pelvis. The catheter was flushed and attached to Teria Khachatryan gravity bag. Catheter was sutured to the skin. Fluoroscopic and ultrasound images were taken and saved for this procedure. FINDINGS: Mild hydronephrosis in the right kidney. Percutaneous access was obtained from mid/upper pole  calyx. Filling defects within the collecting system at end of the procedure are compatible with blood clots. Patient has Caidin Heidenreich ureter stent that extends through the collecting system into the upper pole parenchyma. IMPRESSION: Successful placement of Gennell How right percutaneous nephrostomy tube with ultrasound and fluoroscopic guidance. Blood products within the collecting system related to catheter placement. Plan for routine flushing of the catheter until the urine clears. Electronically Signed   By: Richarda Overlie M.D.   On: 04/01/2018 07:49   Ir Radiology Peripheral Guided Iv Start  Result Date: 04/01/2018 INDICATION: 52 year old with history of Climmie Buelow right ureter stone and right ureter stent placement. The patient has residual hydronephrosis and concern for infection. Request for percutaneous nephrostomy tube placement. EXAM: PLACEMENT OF RIGHT PERCUTANEOUS NEPHROSTOMY TUBE WITH ULTRASOUND AND FLUOROSCOPIC GUIDANCE PLACEMENT OF PERIPHERAL IV BY MD WITH ULTRASOUND COMPARISON:  None. MEDICATIONS: Inpatient on antibiotics. ANESTHESIA/SEDATION: Fentanyl 100 mcg IV; Versed 2.0 mg IV  Moderate Sedation Time:  1 hour and 50 minutes The patient was continuously monitored during the procedure by the interventional radiology nurse under my direct supervision. CONTRAST:  30 mL-administered into the collecting system(s) FLUOROSCOPY TIME:  Fluoroscopy Time: 23 minutes 6 seconds (120 mGy). COMPLICATIONS: None immediate. PROCEDURE: Informed consent was obtained for right percutaneous nephrostomy tube. All questions were addressed. Harlean Regula timeout was performed prior to the initiation of the procedure. Patient was placed prone on the interventional table. Unfortunately, the patient lost IV access at this time. The patient had to be taken off the table. Patient has poor venous access. Dalaysia Harms patent superficial vein in the left forearm was identified with ultrasound. Ultrasound image was taken and saved for documentation tourniquet was placed. The skin  was prepped with chlorhexidine and Gretel Cantu 20 gauge peripheral IV was placed in the superficial vein using ultrasound guidance. IV flushed and was secured to the skin. Patient was placed back on the table in Eon Zunker prone position. The right flank was prepped and draped in Parth Mccormac sterile fashion. Maximal barrier sterile technique was utilized including caps, mask, sterile gowns, sterile gloves, sterile drape, hand hygiene and skin antiseptic. 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 Right kidney was identified with ultrasound but difficult to access the mid or lower pole with ultrasound due to the iliac crest. The skin was anesthetized with 1% lidocaine. 21 gauge needle directed into the upper pole collecting system with ultrasound and clear urine was draining. Wire was advanced into the collecting system and Accustick dilator set was placed with difficulty. Lissette Schenk stiff Amplatz wire was placed and Angeleigh Chiasson 10 [place] drain was advanced over the wire into the collecting system. However, the catheter would not coil into the renal pelvis due to the small size of the pelvis and the existing ureter stent. The catheter was removed over wire in order to improve positioning. Unfortunately, the retention suture on the drain wrapped around the wire. As the retention suture was cut and released, the suture apparently pulled out the wire and access was lost into the right kidney. Small amount of contrast had been injected into the collecting system during placement and there was still retained contrast within the mid and lower calices. These calices were targeted with fluoroscopic guidance using 22 gauge needles. Initially Estevan Kersh mid/upper pole calyx was cannulated and contrast injection confirmed placement in the collecting system. However, it was very difficult to advance Tri Chittick wire into the renal pelvis. Therefore access was attempted from Juni Glaab dilated lower pole calyx. Again, the needle was advanced into the collecting system but wire  could not be advanced into the pelvis. Eventually, wire was advanced through the mid/upper pole calyx into the renal pelvis. Accustick dilator set was placed and eventually Yossi Hinchman 10 French Renee Pain catheter was placed and reconstituted within the renal pelvis. Contrast injection confirmed placement in the renal pelvis. The catheter was flushed and attached to Nazaret Chea gravity bag. Catheter was sutured to the skin. Fluoroscopic and ultrasound images were taken and saved for this procedure. FINDINGS: Mild hydronephrosis in the right kidney. Percutaneous access was obtained from mid/upper pole calyx. Filling defects within the collecting system at end of the procedure are compatible with blood clots. Patient has Daisy Lites ureter stent that extends through the collecting system into the upper pole parenchyma. IMPRESSION: Successful placement of Carlotta Telfair right percutaneous nephrostomy tube with ultrasound and fluoroscopic guidance. Blood products within the collecting system related to catheter placement. Plan for routine flushing of the catheter until  the urine clears. Electronically Signed   By: Richarda Overlie M.D.   On: 04/01/2018 07:49   Ir Nephrostomy Placement Right  Result Date: 04/01/2018 INDICATION: 52 year old with history of Eythan Jayne right ureter stone and right ureter stent placement. The patient has residual hydronephrosis and concern for infection. Request for percutaneous nephrostomy tube placement. EXAM: PLACEMENT OF RIGHT PERCUTANEOUS NEPHROSTOMY TUBE WITH ULTRASOUND AND FLUOROSCOPIC GUIDANCE PLACEMENT OF PERIPHERAL IV BY MD WITH ULTRASOUND COMPARISON:  None. MEDICATIONS: Inpatient on antibiotics. ANESTHESIA/SEDATION: Fentanyl 100 mcg IV; Versed 2.0 mg IV Moderate Sedation Time:  1 hour and 50 minutes The patient was continuously monitored during the procedure by the interventional radiology nurse under my direct supervision. CONTRAST:  30 mL-administered into the collecting system(s) FLUOROSCOPY TIME:  Fluoroscopy Time: 23  minutes 6 seconds (120 mGy). COMPLICATIONS: None immediate. PROCEDURE: Informed consent was obtained for right percutaneous nephrostomy tube. All questions were addressed. Tamina Cyphers timeout was performed prior to the initiation of the procedure. Patient was placed prone on the interventional table. Unfortunately, the patient lost IV access at this time. The patient had to be taken off the table. Patient has poor venous access. Zyair Russi patent superficial vein in the left forearm was identified with ultrasound. Ultrasound image was taken and saved for documentation tourniquet was placed. The skin was prepped with chlorhexidine and Khayla Koppenhaver 20 gauge peripheral IV was placed in the superficial vein using ultrasound guidance. IV flushed and was secured to the skin. Patient was placed back on the table in Kiree Dejarnette prone position. The right flank was prepped and draped in Virgil Lightner sterile fashion. Maximal barrier sterile technique was utilized including caps, mask, sterile gowns, sterile gloves, sterile drape, hand hygiene and skin antiseptic. 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 Right kidney was identified with ultrasound but difficult to access the mid or lower pole with ultrasound due to the iliac crest. The skin was anesthetized with 1% lidocaine. 21 gauge needle directed into the upper pole collecting system with ultrasound and clear urine was draining. Wire was advanced into the collecting system and Accustick dilator set was placed with difficulty. Mikkel Charrette stiff Amplatz wire was placed and Rehmat Murtagh 10 [place] drain was advanced over the wire into the collecting system. However, the catheter would not coil into the renal pelvis due to the small size of the pelvis and the existing ureter stent. The catheter was removed over wire in order to improve positioning. Unfortunately, the retention suture on the drain wrapped around the wire. As the retention suture was cut and released, the suture apparently pulled out the wire and access  was lost into the right kidney. Small amount of contrast had been injected into the collecting system during placement and there was still retained contrast within the mid and lower calices. These calices were targeted with fluoroscopic guidance using 22 gauge needles. Initially Sesar Madewell mid/upper pole calyx was cannulated and contrast injection confirmed placement in the collecting system. However, it was very difficult to advance Cavin Longman wire into the renal pelvis. Therefore access was attempted from Camira Geidel dilated lower pole calyx. Again, the needle was advanced into the collecting system but wire could not be advanced into the pelvis. Eventually, wire was advanced through the mid/upper pole calyx into the renal pelvis. Accustick dilator set was placed and eventually Char Feltman 10 French Renee Pain catheter was placed and reconstituted within the renal pelvis. Contrast injection confirmed placement in the renal pelvis. The catheter was flushed and attached to Nimo Verastegui gravity bag. Catheter was sutured to  the skin. Fluoroscopic and ultrasound images were taken and saved for this procedure. FINDINGS: Mild hydronephrosis in the right kidney. Percutaneous access was obtained from mid/upper pole calyx. Filling defects within the collecting system at end of the procedure are compatible with blood clots. Patient has Darcia Lampi ureter stent that extends through the collecting system into the upper pole parenchyma. IMPRESSION: Successful placement of Sosie Gato right percutaneous nephrostomy tube with ultrasound and fluoroscopic guidance. Blood products within the collecting system related to catheter placement. Plan for routine flushing of the catheter until the urine clears. Electronically Signed   By: Richarda Overlie M.D.   On: 04/01/2018 07:49        Scheduled Meds: . sodium chloride   Intravenous Once  . baclofen  10 mg Oral TID  . clonazePAM  0.5 mg Oral Q24H  . clonazePAM  1 mg Oral BID  . collagenase   Topical Daily  . desmopressin  20 mcg Nasal BID  .  divalproex  500 mg Oral TID  . feeding supplement (ENSURE ENLIVE)  237 mL Oral TID BM  . feeding supplement (PRO-STAT SUGAR FREE 64)  30 mL Oral BID BM  . ferrous sulfate  325 mg Oral Q breakfast  . free water  250 mL Oral TID BM  . insulin aspart  0-5 Units Subcutaneous QHS  . insulin aspart  0-9 Units Subcutaneous TID WC  . insulin detemir  10 Units Subcutaneous Daily  . mouth rinse  15 mL Mouth Rinse BID  . metoprolol tartrate  50 mg Oral QHS  . metoprolol tartrate  75 mg Oral Daily  . multivitamin with minerals  1 tablet Oral Daily  . polyethylene glycol  17 g Oral Daily  . QUEtiapine  125 mg Oral QHS  . QUEtiapine  150 mg Oral Daily  . selenium sulfide  1 application Topical Q T,Th,Sat-1800  . sertraline  100 mg Oral Daily  . sodium chloride flush  5 mL Intracatheter Q8H   Continuous Infusions: . sodium chloride 10 mL/hr at 04/01/18 1501  . cefTRIAXone (ROCEPHIN)  IV 1 g (04/01/18 1502)     LOS: 21 days    Time spent: over 30 min MDM moderate with multiple medical problems   Lacretia Nicks, MD [facility] Pager [phone]  If 7PM-7AM, please contact night-coverage [email or web address] Password [facility] 04/01/2018, 4:44 PM

## 2018-04-01 NOTE — Plan of Care (Signed)
  Problem: Education: Goal: Knowledge of General Education information will improve Description Including pain rating scale, medication(s)/side effects and non-pharmacologic comfort measures Outcome: Progressing   Problem: Health Behavior/Discharge Planning: Goal: Ability to manage health-related needs will improve Outcome: Progressing   Problem: Clinical Measurements: Goal: Ability to maintain clinical measurements within normal limits will improve Outcome: Progressing Goal: Will remain free from infection Outcome: Progressing Goal: Diagnostic test results will improve Outcome: Progressing Goal: Respiratory complications will improve Outcome: Progressing Goal: Cardiovascular complication will be avoided Outcome: Progressing   Problem: Activity: Goal: Risk for activity intolerance will decrease Outcome: Progressing   Problem: Coping: Goal: Level of anxiety will decrease Outcome: Progressing   Problem: Skin Integrity: Goal: Risk for impaired skin integrity will decrease Outcome: Progressing   Problem: Learning/Teaching Needs - Pressure Ulcer Goal: Institute hygiene practices to attempt to prevent hospital-acquired infections will improve Outcome: Progressing Goal: General experience of comfort will improve Outcome: Progressing

## 2018-04-02 ENCOUNTER — Inpatient Hospital Stay (HOSPITAL_COMMUNITY): Payer: Medicare Other

## 2018-04-02 DIAGNOSIS — N10 Acute tubulo-interstitial nephritis: Secondary | ICD-10-CM

## 2018-04-02 DIAGNOSIS — E0590 Thyrotoxicosis, unspecified without thyrotoxic crisis or storm: Secondary | ICD-10-CM

## 2018-04-02 DIAGNOSIS — Z1611 Resistance to penicillins: Secondary | ICD-10-CM

## 2018-04-02 DIAGNOSIS — L89159 Pressure ulcer of sacral region, unspecified stage: Secondary | ICD-10-CM

## 2018-04-02 DIAGNOSIS — M7989 Other specified soft tissue disorders: Secondary | ICD-10-CM

## 2018-04-02 DIAGNOSIS — G8114 Spastic hemiplegia affecting left nondominant side: Secondary | ICD-10-CM

## 2018-04-02 DIAGNOSIS — B964 Proteus (mirabilis) (morganii) as the cause of diseases classified elsewhere: Secondary | ICD-10-CM

## 2018-04-02 DIAGNOSIS — Z1624 Resistance to multiple antibiotics: Secondary | ICD-10-CM

## 2018-04-02 DIAGNOSIS — F319 Bipolar disorder, unspecified: Secondary | ICD-10-CM

## 2018-04-02 LAB — URINALYSIS, ROUTINE W REFLEX MICROSCOPIC

## 2018-04-02 LAB — URINALYSIS, MICROSCOPIC (REFLEX): RBC / HPF: >50 RBC/hpf (ref 0–5)

## 2018-04-02 LAB — CULTURE, BLOOD (ROUTINE X 2)
Culture: NO GROWTH
Culture: NO GROWTH
Special Requests: ADEQUATE

## 2018-04-02 LAB — COMPREHENSIVE METABOLIC PANEL
ALBUMIN: 1.6 g/dL — AB (ref 3.5–5.0)
ALT: 15 U/L (ref 0–44)
ANION GAP: 9 (ref 5–15)
AST: 17 U/L (ref 15–41)
Alkaline Phosphatase: 103 U/L (ref 38–126)
BUN: 28 mg/dL — ABNORMAL HIGH (ref 6–20)
CHLORIDE: 113 mmol/L — AB (ref 98–111)
CO2: 26 mmol/L (ref 22–32)
Calcium: 7.6 mg/dL — ABNORMAL LOW (ref 8.9–10.3)
Creatinine, Ser: 0.83 mg/dL (ref 0.61–1.24)
GFR calc Af Amer: >60 mL/min (ref >60)
GFR calc non Af Amer: >60 mL/min (ref >60)
GLUCOSE: 98 mg/dL (ref 70–99)
POTASSIUM: 4 mmol/L (ref 3.5–5.1)
SODIUM: 148 mmol/L — AB (ref 135–145)
Total Bilirubin: 0.3 mg/dL (ref 0.3–1.2)
Total Protein: 9.5 g/dL — ABNORMAL HIGH (ref 6.5–8.1)

## 2018-04-02 LAB — MAGNESIUM: Magnesium: 2.1 mg/dL (ref 1.7–2.4)

## 2018-04-02 LAB — PROCALCITONIN: Procalcitonin: 0.3 ng/mL

## 2018-04-02 LAB — GLUCOSE, CAPILLARY
GLUCOSE-CAPILLARY: 112 mg/dL — AB (ref 70–99)
GLUCOSE-CAPILLARY: 114 mg/dL — AB (ref 70–99)
Glucose-Capillary: 113 mg/dL — ABNORMAL HIGH (ref 70–99)

## 2018-04-02 LAB — CBC
HCT: 30.1 % — ABNORMAL LOW (ref 39.0–52.0)
Hemoglobin: 8.8 g/dL — ABNORMAL LOW (ref 13.0–17.0)
MCH: 26.1 pg (ref 26.0–34.0)
MCHC: 29.2 g/dL — AB (ref 30.0–36.0)
MCV: 89.3 fL (ref 78.0–100.0)
Platelets: 225 10*3/uL (ref 150–400)
RBC: 3.37 MIL/uL — ABNORMAL LOW (ref 4.22–5.81)
RDW: 18.1 % — AB (ref 11.5–15.5)
WBC: 12.8 10*3/uL — AB (ref 4.0–10.5)

## 2018-04-02 MED ORDER — IOHEXOL 300 MG/ML  SOLN
75.0000 mL | Freq: Once | INTRAMUSCULAR | Status: AC | PRN
  Administered 2018-04-02: 105 mL via INTRAVENOUS

## 2018-04-02 MED ORDER — ENOXAPARIN SODIUM 40 MG/0.4ML ~~LOC~~ SOLN
40.0000 mg | SUBCUTANEOUS | Status: DC
  Administered 2018-04-02 – 2018-04-06 (×5): 40 mg via SUBCUTANEOUS
  Filled 2018-04-02 (×5): qty 0.4

## 2018-04-02 MED ORDER — LACTATED RINGERS IV BOLUS
1000.0000 mL | Freq: Once | INTRAVENOUS | Status: AC
  Administered 2018-04-02: 1000 mL via INTRAVENOUS

## 2018-04-02 NOTE — Progress Notes (Signed)
Appreciate interventional radiology/placement of percutaneous tube.  For now, urologic situation is maximized with upper tract drainage with percutaneous tube.  Plan would be to leave this tube in, remove double-J stent prior to eventual antegrade nephrostogram.  If antegrade nephrostogram shows no obstruction/stone, nephrostomy tube can then be removed.  I will look toward removing his double-J stent early next week.  Can do this at bedside.

## 2018-04-02 NOTE — Progress Notes (Signed)
PROGRESS NOTE    Cody [name]  ZOX:[id number] DOB: 1965-10-21 DOA: 03/10/2018 PCP: [name], MD   Brief Narrative:  52 year old male TBI, spastic left hemiplegia DM 2, hypothyroid, bipolar. Admitted 03/11/2018 increasing lethargy X 48 hours. Admitted with sepsis protocol started-found to have AKI creatinine 2.1 BUN 40 T-max 102.6. WBC 15 CT abdomen pelvis right obstructive uropathy 7X5 UVJ stone. CT head = extensive encephalomalacia slight interval enlargement of the ventricular system compared to 2009 CT. Presumed sepsis from acute pyelonephritis started on Vanco Zosyn given 4 L of fluid and kept on stepdown unit. Urologist was consulted underwent cystoscopy and placement of 6 French X 24 cm JJ stent 7/25. Hospitalization complicated by persisting fever and sepsis.  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  Nephrology was consulted.  Assessment & Plan:    Principal Problem:   Acute pyelonephritis Active Problems:   Depression   Diabetes mellitus without complication (HCC)   Hypertension   Spastic hemiplegia (HCC)   Sepsis (HCC)   AKI (acute kidney injury) (HCC)   Hydroureteronephrosis-right   Ureterovesical junction (UVJ) obstruction   Acute metabolic encephalopathy   Acute respiratory failure with hypoxia (HCC)   Pressure injury of skin   Right kidney stone   Hypernatremia   Aspiration into airway   Palliative care encounter   Diabetes insipidus (HCC)   Sepsis likely multifactorial  Persistent fever Pt with R obsteructive uropathy with 7 x 5 mm R ureterovesical junction stone causing hydroureteronephrosis and perinephric stranding (with multiple nonobstructing calculi measuring up to 10 mm) S/p cystoscopy with R retrograde ureteropyelogram, fluoscopic interpretation, placement of 6 french by 24 cm contour double J stent without tether on 7/25 Pt with recurrent fevers and infectious disease was consulted due to fever without a clear source.   He was  started on unasyn for possible aspiration pneumonia and then transitioned to augmentin.  He has completed course of treatment.  Patient continued to have fevers. Some concern for drug fever? Recurrent fevers since perc neph tube placement.  Last fever 8/16 AM.  Will repeat cultures and consult ID.  Follow up CT chest as well and left LE plain films.  L buttock with decubitus ulcer (reviewed imaging with rads and no osteo).  Will continue current abx for now with urinary source most likely.  No clear respiratory sx.    Blood cx from 7/28 NGTD as well as 8/6 NGTD.   Repeat urine culture from 8/8 with proteus sensitive to ceftriaxone. Repeat blood cx from 8/11 NGTD x2 days  Repeat urine cx from 8/11 again growing proteus  CXR from 8/11 with opacity in RUL thought 2/2 atelectasis and pneumonia CXR from 8/13 with persistent pneumonia in RUL Start ceftriaxone for proteus UTI.  Will hold off on treating pneumonia as pt doing well on RA and was treated earlier in hospitalization for this. Discussed with urology and ordered CT which was notable for R ureteral stent with mild residual hydronephrosis, delayed R renal nephrogram and swelling of R kidney with respect to L may indicate abnormal renal function 2/2 obstruction or infection - urology recommended perc nephrostomy tube placement Now s/p R perc nephrostomy tube (see note, difficult placement - will discuss with urology timing of stent removal) PCT up  Tachycardia: in setting of recurrent fevers, bolus and follow  Severe hypernatremia thought to be secondary to central diabetes insipidus/acute kidney injury Increased today, follow Patient was seen by nephrology.   He was placed on desmopressin 20 mcg nasal BID  and dextrose infusion.   Sodium level has improved  He has been tolerating his diet and water.   Discussed with nursing, encourage PO free water intake, at least 250 cc PO three times in between meals (and at least 250 cc 3 times day with  meals) He will need continued encouragement to consume water even at skilled nursing facility.  We will continue with current dose of desmopressin intranasally.   Will need periodic monitoring of his sodium levels.   If he develops hypernatremia despite these measures then further discussions will need to be held with family as the only way to provide him free water than would be to place a PEG tube (discussed with sister this possibility today)  Cholelithiasis: seen on imaging, doesn't seem to have any abdominal pain.  Normal LFT's.  But large stone in gallbladder neck.  No evidence of cholecystitis.  Follow.   History of traumatic brain injury with left hemiplegia Patient is stable.  Patient has a shunt.  This was discussed with neurosurgery who did not feel like his symptoms were secondary to shunt malfunction.  Neurologically he appears to have improved.  He is probably close to his baseline.  Per neurosurgery, consider shunt evaluation after abx.   Chronic aspiration/oropharyngeal dysphagia He is on dysphagia 1 diet.  Speech therapy has seen.  Remains at high risk for recurrent aspirations.  Palliative medicine has been following.   History of bipolar disorder He is on medications which are being continued.  Stage 3 pressure injury  Noted in L buttock and heel (previous note mentioned R posterior thigh, but I did not note any here on my exam today).  Wound care.  No active infection appreciated.  Continue frequent turns.  F/u x ray L ankle.  Diabetes mellitus type 2 Continue long-acting insulin and SSI.  BG's stable.  Acute metabolic encephalopathy Most likely secondary to acute infection.  Appears close to baseline.  Stable..  Acute respiratory failure with hypoxia Secondary to aspiration.  Resolved.  No PE noted on CT angiogram done on 7/29.  Normocytic anemia Received transfusion for hb of 6.7 8/14 -> improved to 8.8.  Stable.  Start iron No evidence of bleeding,  follow  Essential hypertension Blood pressure is well controlled.  Patient is on metoprolol.  Obstructive uropathy Status post stent placement -> now s/p perc neph tube placed on 8/14   Imaging studies did suggest a retained guidewire as well as proximal aspect of stent may be in renal parenchyma.   Urology is aware of this.   Follow-up with urology after discharge.  Foley removed.  Follow post voids.  Nonsustained Vtach: follow, no echo to eval EF.  Will continue to monitor.    Hypokalemia: replete, follow  DVT prophylaxis: lovenox Code Status: full  Family Communication: discussed with sister 8/14 Disposition Plan: pending improvement   Consultants:   Urology  Neurosurgery  ID  Nephrology  Palliative  Procedures:  On 7/25: Cystoscopy, right retrograde ureteropyelogram, fluoroscopic interpretation, placement of 6 French by 24 cm contour double-J stent without tether  Lower extremity Doppler study  Negative for DVT  8/14 R perc nephrostomy tube   Antimicrobials: Anti-infectives (From admission, onward)   Start     Dose/Rate Route Frequency Ordered Stop   03/31/18 1330  ciprofloxacin (CIPRO) IVPB 400 mg     400 mg 200 mL/hr over 60 Minutes Intravenous To Radiology 03/31/18 1236 03/31/18 1830   03/29/18 1430  cefTRIAXone (ROCEPHIN) 1 g in sodium chloride 0.9 %  100 mL IVPB     1 g 200 mL/hr over 30 Minutes Intravenous Every 24 hours 03/29/18 1430     03/19/18 1400  amoxicillin-clavulanate (AUGMENTIN) 875-125 MG per tablet 1 tablet     1 tablet Oral Every 12 hours 03/19/18 0911 03/23/18 2224   03/18/18 1700  piperacillin-tazobactam (ZOSYN) IVPB 3.375 g  Status:  Discontinued     3.375 g 12.5 mL/hr over 240 Minutes Intravenous Every 8 hours 03/18/18 1343 03/19/18 0910   03/17/18 1400  Ampicillin-Sulbactam (UNASYN) 3 g in sodium chloride 0.9 % 100 mL IVPB  Status:  Discontinued     3 g 200 mL/hr over 30 Minutes Intravenous Every 6 hours 03/17/18 1300 03/18/18  1337   03/14/18 1430  ceFEPIme (MAXIPIME) 2 g in sodium chloride 0.9 % 100 mL IVPB  Status:  Discontinued     2 g 200 mL/hr over 30 Minutes Intravenous Every 24 hours 03/14/18 1136 03/15/18 0956   03/14/18 0100  vancomycin (VANCOCIN) 500 mg in sodium chloride 0.9 % 100 mL IVPB  Status:  Discontinued     500 mg 100 mL/hr over 60 Minutes Intravenous Every 12 hours 03/13/18 1510 03/15/18 0956   03/11/18 2000  vancomycin (VANCOCIN) IVPB 1000 mg/200 mL premix  Status:  Discontinued     1,000 mg 200 mL/hr over 60 Minutes Intravenous Every 24 hours 03/10/18 2053 03/11/18 1342   03/11/18 1400  vancomycin (VANCOCIN) IVPB 750 mg/150 ml premix  Status:  Discontinued     750 mg 150 mL/hr over 60 Minutes Intravenous Every 12 hours 03/11/18 1342 03/13/18 1510   03/11/18 0400  piperacillin-tazobactam (ZOSYN) IVPB 3.375 g  Status:  Discontinued     3.375 g 12.5 mL/hr over 240 Minutes Intravenous Every 8 hours 03/10/18 2053 03/14/18 1136   03/10/18 2030  piperacillin-tazobactam (ZOSYN) IVPB 3.375 g     3.375 g 100 mL/hr over 30 Minutes Intravenous  Once 03/10/18 2023 03/10/18 2139   03/10/18 2030  vancomycin (VANCOCIN) IVPB 1000 mg/200 mL premix     1,000 mg 200 mL/hr over 60 Minutes Intravenous  Once 03/10/18 2023 03/10/18 2208     Subjective: Upset when we turn him. Difficult to understand.   Objective: Vitals:   04/01/18 2041 04/02/18 0313 04/02/18 0500 04/02/18 0936  BP: 119/62 (!) 138/96 137/80 (!) 162/102  Pulse: (!) 103 (!) 101 (!) 102 (!) 113  Resp: (!) 27 (!) 21 20   Temp: 99.9 F (37.7 C)  (!) 101.1 F (38.4 C)   TempSrc: Axillary  Axillary   SpO2: 100% 100% 100%   Weight:   55.3 kg   Height:        Intake/Output Summary (Last 24 hours) at 04/02/2018 1018 Last data filed at 04/02/2018 0951 Gross per 24 hour  Intake 2198.07 ml  Output 1550 ml  Net 648.07 ml   Filed Weights   03/31/18 0638 04/01/18 0632 04/02/18 0500  Weight: 63 kg 55.8 kg 55.3 kg     Examination:  General: No acute distress. Cardiovascular: Heart sounds show a regular rate, and rhythm.  Lungs: Clear to auscultation bilaterally with good air movement.  Abdomen: Soft, nontender, nondistended  Neurological: Alert and disoriented.  Cranial nerves II through XII grossly intact. Skin: Warm and dry. No rashes or lesions. Extremities: LLE with decubitus ulcers to heel.  Decubitus ulcer to L buttocks.   Data Reviewed: I have personally reviewed following labs and imaging studies  CBC: Recent Labs  Lab 03/30/18 0311 03/31/18 0344 03/31/18  1616 04/01/18 0432 04/02/18 0432  WBC 13.4* 11.2* 12.1* 13.1* 12.8*  NEUTROABS  --   --  6.9  --   --   HGB 7.8* 6.7* 7.7* 8.8* 8.8*  HCT 28.0* 22.7* 25.8* 29.4* 30.1*  MCV 94.3 87.6 88.1 87.8 89.3  PLT 252 267 257 268 225   Basic Metabolic Panel: Recent Labs  Lab 03/29/18 0340 03/30/18 0311 03/31/18 0344 04/01/18 0432 04/02/18 0432  NA 143 146* 144 146* 148*  K 3.9 4.8 3.4* 4.2 4.0  CL 108 109 109 114* 113*  CO2 30 27 30 26 26   GLUCOSE 147* 98 121* 121* 98  BUN 19 23* 16 14 28*  CREATININE 0.80 0.93 0.73 0.74 0.83  CALCIUM 7.6* 7.8* 7.7* 7.6* 7.6*  MG  --  1.9 2.0 1.8 2.1   GFR: Estimated Creatinine Clearance: 82.4 mL/min (by C-G formula based on SCr of 0.83 mg/dL). Liver Function Tests: Recent Labs  Lab 03/27/18 0507 03/31/18 0344 04/02/18 0432  AST 27 20 17   ALT 19 15 15   ALKPHOS 96 85 103  BILITOT 0.6 0.4 0.3  PROT 8.4* 8.5* 9.5*  ALBUMIN 1.5* 1.4* 1.6*   No results for input(s): LIPASE, AMYLASE in the last 168 hours. No results for input(s): AMMONIA in the last 168 hours. Coagulation Profile: Recent Labs  Lab 03/31/18 1616  INR 1.29   Cardiac Enzymes: No results for input(s): CKTOTAL, CKMB, CKMBINDEX, TROPONINI in the last 168 hours. BNP (last 3 results) No results for input(s): PROBNP in the last 8760 hours. HbA1C: No results for input(s): HGBA1C in the last 72 hours. CBG: Recent Labs   Lab 04/01/18 0746 04/01/18 1117 04/01/18 1629 04/01/18 2059 04/02/18 0748  GLUCAP 94 110* 144* 164* 112*   Lipid Profile: No results for input(s): CHOL, HDL, LDLCALC, TRIG, CHOLHDL, LDLDIRECT in the last 72 hours. Thyroid Function Tests: No results for input(s): TSH, T4TOTAL, FREET4, T3FREE, THYROIDAB in the last 72 hours. Anemia Panel: Recent Labs    03/31/18 0742  VITAMINB12 1,098*  FOLATE 14.4  FERRITIN 411*  TIBC 165*  IRON 27*   Sepsis Labs: Recent Labs  Lab 03/29/18 0340 03/30/18 0311 03/31/18 0344 04/02/18 0432  PROCALCITON 0.21 0.34 0.23 0.30    Recent Results (from the past 240 hour(s))  Culture, Urine     Status: Abnormal   Collection Time: 03/25/18  9:30 AM  Result Value Ref Range Status   Specimen Description URINE, RANDOM  Final   Special Requests   Final    NONE Performed at [facility], [address], [place], [place] [address]    Culture >=100,000 COLONIES/mL PROTEUS MIRABILIS (A)  Final   Report Status 03/28/2018 FINAL  Final   Organism ID, Bacteria PROTEUS MIRABILIS (A)  Final      Susceptibility   Proteus mirabilis - MIC*    AMPICILLIN >=32 RESISTANT Resistant     CEFAZOLIN 8 SENSITIVE Sensitive     CEFTRIAXONE <=1 SENSITIVE Sensitive     CIPROFLOXACIN >=4 RESISTANT Resistant     GENTAMICIN <=1 SENSITIVE Sensitive     IMIPENEM 2 SENSITIVE Sensitive     NITROFURANTOIN 256 RESISTANT Resistant     TRIMETH/SULFA >=320 RESISTANT Resistant     AMPICILLIN/SULBACTAM 16 INTERMEDIATE Intermediate     PIP/TAZO <=4 SENSITIVE Sensitive     * >=100,000 COLONIES/mL PROTEUS MIRABILIS  Culture, blood (Routine X 2) w Reflex to ID Panel     Status: None (Preliminary result)   Collection Time: 03/28/18 10:40 AM  Result Value Ref Range Status   Specimen Description BLOOD LEFT HAND  Final   Special Requests   Final    BOTTLES DRAWN AEROBIC ONLY Blood Culture results may not be optimal due to an inadequate volume of blood received in culture bottles    Culture   Final    NO GROWTH 4 DAYS Performed at [facility], [address], [place], [place] [address]    Report Status PENDING  Incomplete  Culture, blood (Routine X 2) w Reflex to ID Panel     Status: None (Preliminary result)   Collection Time: 03/28/18 10:48 AM  Result Value Ref Range Status   Specimen Description BLOOD LEFT HAND  Final   Special Requests   Final    BOTTLES DRAWN AEROBIC ONLY Blood Culture adequate volume   Culture   Final    NO GROWTH 4 DAYS Performed at [facility], [address], [place], [place] [address]    Report Status PENDING  Incomplete  Culture, Urine     Status: Abnormal   Collection Time: 03/28/18 11:14 AM  Result Value Ref Range Status   Specimen Description URINE, CATHETERIZED  Final   Special Requests   Final    NONE Performed at [facility], [address] [place], [place] [address]    Culture >=100,000 COLONIES/mL PROTEUS MIRABILIS (A)  Final   Report Status 03/30/2018 FINAL  Final   Organism ID, Bacteria PROTEUS MIRABILIS (A)  Final      Susceptibility   Proteus mirabilis - MIC*    AMPICILLIN >=32 RESISTANT Resistant     CEFAZOLIN 8 SENSITIVE Sensitive     CEFTRIAXONE <=1 SENSITIVE Sensitive     CIPROFLOXACIN >=4 RESISTANT Resistant     GENTAMICIN <=1 SENSITIVE Sensitive     IMIPENEM 2 SENSITIVE Sensitive     NITROFURANTOIN 128 RESISTANT Resistant     TRIMETH/SULFA >=320 RESISTANT Resistant     AMPICILLIN/SULBACTAM 16 INTERMEDIATE Intermediate     PIP/TAZO <=4 SENSITIVE Sensitive     * >=100,000 COLONIES/mL PROTEUS MIRABILIS         Radiology Studies: Ir US Guide Vasc Access Left  Result Date: 04/01/2018 INDICATION: 52 year old with history of a right ureter stone and right ureter stent placement. The patient has residual hydronephrosis and concern for infection. Request for percutaneous nephrostomy tube placement. EXAM: PLACEMENT OF RIGHT PERCUTANEOUS NEPHROSTOMY TUBE WITH ULTRASOUND AND FLUOROSCOPIC  GUIDANCE PLACEMENT OF PERIPHERAL IV BY MD WITH ULTRASOUND COMPARISON:  None. MEDICATIONS: Inpatient on antibiotics. ANESTHESIA/SEDATION: Fentanyl 100 mcg IV; Versed 2.0 mg IV Moderate Sedation Time:  1 hour and 50 minutes The patient was continuously monitored during the procedure by the interventional radiology nurse under my direct supervision. CONTRAST:  30 mL-administered into the collecting system(s) FLUOROSCOPY TIME:  Fluoroscopy Time: 23 minutes 6 seconds (120 mGy). COMPLICATIONS: None immediate. PROCEDURE: Informed consent was obtained for right percutaneous nephrostomy tube. All questions were addressed. A timeout was performed prior to the initiation of the procedure. Patient was placed prone on the interventional table. Unfortunately, the patient lost IV access at this time. The patient had to be taken off the table. Patient has poor venous access. A patent superficial vein in the left forearm was identified with ultrasound. Ultrasound image was taken and saved for documentation tourniquet was placed. The skin was prepped with chlorhexidine and a 20 gauge peripheral IV was placed in the superficial vein using ultrasound guidance. IV flushed and was secured to the skin. Patient  was placed back on the table in a prone position. The right flank was prepped and draped in a sterile fashion. Maximal barrier sterile technique was utilized including caps, mask, sterile gowns, sterile gloves, sterile drape, hand hygiene and skin antiseptic. 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 Right kidney was identified with ultrasound but difficult to access the mid or lower pole with ultrasound due to the iliac crest. The skin was anesthetized with 1% lidocaine. 21 gauge needle directed into the upper pole collecting system with ultrasound and clear urine was draining. Wire was advanced into the collecting system and Accustick dilator set was placed with difficulty. A stiff Amplatz wire was  placed and a 10 [place] drain was advanced over the wire into the collecting system. However, the catheter would not coil into the renal pelvis due to the small size of the pelvis and the existing ureter stent. The catheter was removed over wire in order to improve positioning. Unfortunately, the retention suture on the drain wrapped around the wire. As the retention suture was cut and released, the suture apparently pulled out the wire and access was lost into the right kidney. Small amount of contrast had been injected into the collecting system during placement and there was still retained contrast within the mid and lower calices. These calices were targeted with fluoroscopic guidance using 22 gauge needles. Initially a mid/upper pole calyx was cannulated and contrast injection confirmed placement in the collecting system. However, it was very difficult to advance a wire into the renal pelvis. Therefore access was attempted from a dilated lower pole calyx. Again, the needle was advanced into the collecting system but wire could not be advanced into the pelvis. Eventually, wire was advanced through the mid/upper pole calyx into the renal pelvis. Accustick dilator set was placed and eventually a 10 French Renee Painawson Mueller catheter was placed and reconstituted within the renal pelvis. Contrast injection confirmed placement in the renal pelvis. The catheter was flushed and attached to a gravity bag. Catheter was sutured to the skin. Fluoroscopic and ultrasound images were taken and saved for this procedure. FINDINGS: Mild hydronephrosis in the right kidney. Percutaneous access was obtained from mid/upper pole calyx. Filling defects within the collecting system at end of the procedure are compatible with blood clots. Patient has a ureter stent that extends through the collecting system into the upper pole parenchyma. IMPRESSION: Successful placement of a right percutaneous nephrostomy tube with ultrasound and  fluoroscopic guidance. Blood products within the collecting system related to catheter placement. Plan for routine flushing of the catheter until the urine clears. Electronically Signed   By: Richarda OverlieAdam  Henn M.D.   On: 04/01/2018 07:49   Ir Radiology Peripheral Guided Iv Start  Result Date: 04/01/2018 INDICATION: 52 year old with history of a right ureter stone and right ureter stent placement. The patient has residual hydronephrosis and concern for infection. Request for percutaneous nephrostomy tube placement. EXAM: PLACEMENT OF RIGHT PERCUTANEOUS NEPHROSTOMY TUBE WITH ULTRASOUND AND FLUOROSCOPIC GUIDANCE PLACEMENT OF PERIPHERAL IV BY MD WITH ULTRASOUND COMPARISON:  None. MEDICATIONS: Inpatient on antibiotics. ANESTHESIA/SEDATION: Fentanyl 100 mcg IV; Versed 2.0 mg IV Moderate Sedation Time:  1 hour and 50 minutes The patient was continuously monitored during the procedure by the interventional radiology nurse under my direct supervision. CONTRAST:  30 mL-administered into the collecting system(s) FLUOROSCOPY TIME:  Fluoroscopy Time: 23 minutes 6 seconds (120 mGy). COMPLICATIONS: None immediate. PROCEDURE: Informed consent was obtained for right percutaneous nephrostomy tube. All questions were addressed. A  timeout was performed prior to the initiation of the procedure. Patient was placed prone on the interventional table. Unfortunately, the patient lost IV access at this time. The patient had to be taken off the table. Patient has poor venous access. A patent superficial vein in the left forearm was identified with ultrasound. Ultrasound image was taken and saved for documentation tourniquet was placed. The skin was prepped with chlorhexidine and a 20 gauge peripheral IV was placed in the superficial vein using ultrasound guidance. IV flushed and was secured to the skin. Patient was placed back on the table in a prone position. The right flank was prepped and draped in a sterile fashion. Maximal barrier sterile  technique was utilized including caps, mask, sterile gowns, sterile gloves, sterile drape, hand hygiene and skin antiseptic. 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 Right kidney was identified with ultrasound but difficult to access the mid or lower pole with ultrasound due to the iliac crest. The skin was anesthetized with 1% lidocaine. 21 gauge needle directed into the upper pole collecting system with ultrasound and clear urine was draining. Wire was advanced into the collecting system and Accustick dilator set was placed with difficulty. A stiff Amplatz wire was placed and a 10 [place] drain was advanced over the wire into the collecting system. However, the catheter would not coil into the renal pelvis due to the small size of the pelvis and the existing ureter stent. The catheter was removed over wire in order to improve positioning. Unfortunately, the retention suture on the drain wrapped around the wire. As the retention suture was cut and released, the suture apparently pulled out the wire and access was lost into the right kidney. Small amount of contrast had been injected into the collecting system during placement and there was still retained contrast within the mid and lower calices. These calices were targeted with fluoroscopic guidance using 22 gauge needles. Initially a mid/upper pole calyx was cannulated and contrast injection confirmed placement in the collecting system. However, it was very difficult to advance a wire into the renal pelvis. Therefore access was attempted from a dilated lower pole calyx. Again, the needle was advanced into the collecting system but wire could not be advanced into the pelvis. Eventually, wire was advanced through the mid/upper pole calyx into the renal pelvis. Accustick dilator set was placed and eventually a 10 French Renee Pain catheter was placed and reconstituted within the renal pelvis. Contrast injection confirmed placement in  the renal pelvis. The catheter was flushed and attached to a gravity bag. Catheter was sutured to the skin. Fluoroscopic and ultrasound images were taken and saved for this procedure. FINDINGS: Mild hydronephrosis in the right kidney. Percutaneous access was obtained from mid/upper pole calyx. Filling defects within the collecting system at end of the procedure are compatible with blood clots. Patient has a ureter stent that extends through the collecting system into the upper pole parenchyma. IMPRESSION: Successful placement of a right percutaneous nephrostomy tube with ultrasound and fluoroscopic guidance. Blood products within the collecting system related to catheter placement. Plan for routine flushing of the catheter until the urine clears. Electronically Signed   By: Richarda Overlie M.D.   On: 04/01/2018 07:49   Ir Nephrostomy Placement Right  Result Date: 04/01/2018 INDICATION: 52 year old with history of a right ureter stone and right ureter stent placement. The patient has residual hydronephrosis and concern for infection. Request for percutaneous nephrostomy tube placement. EXAM: PLACEMENT OF RIGHT PERCUTANEOUS NEPHROSTOMY TUBE  WITH ULTRASOUND AND FLUOROSCOPIC GUIDANCE PLACEMENT OF PERIPHERAL IV BY MD WITH ULTRASOUND COMPARISON:  None. MEDICATIONS: Inpatient on antibiotics. ANESTHESIA/SEDATION: Fentanyl 100 mcg IV; Versed 2.0 mg IV Moderate Sedation Time:  1 hour and 50 minutes The patient was continuously monitored during the procedure by the interventional radiology nurse under my direct supervision. CONTRAST:  30 mL-administered into the collecting system(s) FLUOROSCOPY TIME:  Fluoroscopy Time: 23 minutes 6 seconds (120 mGy). COMPLICATIONS: None immediate. PROCEDURE: Informed consent was obtained for right percutaneous nephrostomy tube. All questions were addressed. A timeout was performed prior to the initiation of the procedure. Patient was placed prone on the interventional table. Unfortunately, the  patient lost IV access at this time. The patient had to be taken off the table. Patient has poor venous access. A patent superficial vein in the left forearm was identified with ultrasound. Ultrasound image was taken and saved for documentation tourniquet was placed. The skin was prepped with chlorhexidine and a 20 gauge peripheral IV was placed in the superficial vein using ultrasound guidance. IV flushed and was secured to the skin. Patient was placed back on the table in a prone position. The right flank was prepped and draped in a sterile fashion. Maximal barrier sterile technique was utilized including caps, mask, sterile gowns, sterile gloves, sterile drape, hand hygiene and skin antiseptic. 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 Right kidney was identified with ultrasound but difficult to access the mid or lower pole with ultrasound due to the iliac crest. The skin was anesthetized with 1% lidocaine. 21 gauge needle directed into the upper pole collecting system with ultrasound and clear urine was draining. Wire was advanced into the collecting system and Accustick dilator set was placed with difficulty. A stiff Amplatz wire was placed and a 10 [place] drain was advanced over the wire into the collecting system. However, the catheter would not coil into the renal pelvis due to the small size of the pelvis and the existing ureter stent. The catheter was removed over wire in order to improve positioning. Unfortunately, the retention suture on the drain wrapped around the wire. As the retention suture was cut and released, the suture apparently pulled out the wire and access was lost into the right kidney. Small amount of contrast had been injected into the collecting system during placement and there was still retained contrast within the mid and lower calices. These calices were targeted with fluoroscopic guidance using 22 gauge needles. Initially a mid/upper pole calyx was  cannulated and contrast injection confirmed placement in the collecting system. However, it was very difficult to advance a wire into the renal pelvis. Therefore access was attempted from a dilated lower pole calyx. Again, the needle was advanced into the collecting system but wire could not be advanced into the pelvis. Eventually, wire was advanced through the mid/upper pole calyx into the renal pelvis. Accustick dilator set was placed and eventually a 10 French Renee Painawson Mueller catheter was placed and reconstituted within the renal pelvis. Contrast injection confirmed placement in the renal pelvis. The catheter was flushed and attached to a gravity bag. Catheter was sutured to the skin. Fluoroscopic and ultrasound images were taken and saved for this procedure. FINDINGS: Mild hydronephrosis in the right kidney. Percutaneous access was obtained from mid/upper pole calyx. Filling defects within the collecting system at end of the procedure are compatible with blood clots. Patient has a ureter stent that extends through the collecting system into the upper pole parenchyma. IMPRESSION: Successful placement  of a right percutaneous nephrostomy tube with ultrasound and fluoroscopic guidance. Blood products within the collecting system related to catheter placement. Plan for routine flushing of the catheter until the urine clears. Electronically Signed   By: Richarda Overlie M.D.   On: 04/01/2018 07:49        Scheduled Meds: . sodium chloride   Intravenous Once  . baclofen  10 mg Oral TID  . clonazePAM  0.5 mg Oral Q24H  . clonazePAM  1 mg Oral BID  . collagenase   Topical Daily  . desmopressin  20 mcg Nasal BID  . divalproex  500 mg Oral TID  . feeding supplement (ENSURE ENLIVE)  237 mL Oral TID BM  . feeding supplement (PRO-STAT SUGAR FREE 64)  30 mL Oral BID BM  . ferrous sulfate  325 mg Oral Q breakfast  . free water  250 mL Oral TID BM  . insulin aspart  0-5 Units Subcutaneous QHS  . insulin aspart  0-9  Units Subcutaneous TID WC  . insulin detemir  10 Units Subcutaneous Daily  . mouth rinse  15 mL Mouth Rinse BID  . metoprolol tartrate  50 mg Oral QHS  . metoprolol tartrate  75 mg Oral Daily  . multivitamin with minerals  1 tablet Oral Daily  . polyethylene glycol  17 g Oral Daily  . QUEtiapine  125 mg Oral QHS  . QUEtiapine  150 mg Oral Daily  . selenium sulfide  1 application Topical Q T,Th,Sat-1800  . sertraline  100 mg Oral Daily  . sodium chloride flush  5 mL Intracatheter Q8H   Continuous Infusions: . sodium chloride 10 mL/hr at 04/01/18 1501  . cefTRIAXone (ROCEPHIN)  IV 1 g (04/01/18 1502)  . lactated ringers       LOS: 22 days    Time spent: over 30 min MDM high with recurrent fevers   Lacretia Nicks, MD [facility] Pager [phone]  If 7PM-7AM, please contact night-coverage [email or web address] Password [email or web address] 04/02/2018, 10:18 AM

## 2018-04-02 NOTE — Consult Note (Signed)
[facility]    Date of Admission:  03/10/2018   Total days of antibiotics 5        Day 5 ceftriaxone                Reason for Consult: Pyelonephritis     Referring Provider: Lowell GuitarPowell   Assessment: Cody [name] is a 52 y.o. male admitted to the hospital 23 days now with recurrent fevers. Initially was treated for suspected aspiration pneumonia with augmentin, however 48h after stopping this he again developed fever. CT scan revealed pyelonephritis involving R kidney with proteus mirabilis in the urine (R-amp, cipro, nitrofurantoin, TMP-SMX; I-amp-sulbactam). He is on day 5 of antibiotics and 2 days following percutaneous nephrostomy tube placement. He has had over 2.2 L out in first 24h; fluid today appears red without purulence. Please continue ceftriaxone. If he continues to have fevers would consider repeating ultrasound of kidney to reassess but hopefully will see some improvement in fevers over the next 2 days.   His L arm is larger/swollen. Would check ultrasound for DVT (not on VTE prophylaxis).   Plan: 1. Continue ceftriaxone 1gm QD.  2. Follow fever/wbc curve and nephrostomy drainage.   3. US left upper extremity   Principal Problem:   Acute pyelonephritis Active Problems:   Depression   Diabetes mellitus without complication (HCC)   Hypertension   Spastic hemiplegia (HCC)   Sepsis (HCC)   AKI (acute kidney injury) (HCC)   Hydroureteronephrosis-right   Ureterovesical junction (UVJ) obstruction   Acute metabolic encephalopathy   Acute respiratory failure with hypoxia (HCC)   Pressure injury of skin   Right kidney stone   Hypernatremia   Aspiration into airway   Palliative care encounter   Diabetes insipidus (HCC)   . sodium chloride   Intravenous Once  . baclofen  10 mg Oral TID  . clonazePAM  0.5 mg Oral Q24H  . clonazePAM  1 mg Oral BID  . collagenase   Topical Daily  . desmopressin  20 mcg Nasal BID  . divalproex  500 mg Oral TID   . feeding supplement (ENSURE ENLIVE)  237 mL Oral TID BM  . feeding supplement (PRO-STAT SUGAR FREE 64)  30 mL Oral BID BM  . ferrous sulfate  325 mg Oral Q breakfast  . free water  250 mL Oral TID BM  . insulin aspart  0-5 Units Subcutaneous QHS  . insulin aspart  0-9 Units Subcutaneous TID WC  . insulin detemir  10 Units Subcutaneous Daily  . mouth rinse  15 mL Mouth Rinse BID  . metoprolol tartrate  50 mg Oral QHS  . metoprolol tartrate  75 mg Oral Daily  . multivitamin with minerals  1 tablet Oral Daily  . polyethylene glycol  17 g Oral Daily  . QUEtiapine  125 mg Oral QHS  . QUEtiapine  150 mg Oral Daily  . selenium sulfide  1 application Topical Q T,Th,Sat-1800  . sertraline  100 mg Oral Daily  . sodium chloride flush  5 mL Intracatheter Q8H    HPI: Cody [name] is a 52 y.o. male admitted 03/10/2018 with increased lethargy. He has a significant past medical history including TBI, spastic left hemiplegia, DM 2, hyperthyroidism and bipolar.   He was seen by Dr. Luciana Axeomer and Tammy SoursGreg at the time of admission for fevers of unknown origin. Was on augmentin prior to hospital admit, all cultures were negative at that time, CXR appeared to  be improved. He does have a pressure ulcer on his sacrum. He was on piperacillin-tazobactam for a period of time and transitioned to augmentin on day 4 of therapy through 8/7 as his fevers were improving.   He began having fevers again to 101 F on 8/09 with ongoing leukocytosis 11-13K. Urine culture was obtained on 8/08 revealing proteus. He has since been   Review of Systems  Unable to perform ROS: Mental acuity    Past Medical History:  Diagnosis Date  . Depression   . Diabetes mellitus without complication (HCC)   . GAD (generalized anxiety disorder)   . Hypertension   . Hypothyroidism   . Spastic hemiplegia (HCC)   . Thyroid disease    hypothyroidism    Social History   Tobacco Use  . Smoking status: Never Smoker  . Smokeless tobacco:  Never Used  Substance Use Topics  . Alcohol use: Never    Frequency: Never  . Drug use: Never    Family History  Problem Relation Age of Onset  . Hypertension Mother   . Stroke Mother   . Hypertension Father   . Hyperlipidemia Father   . COPD Father   . Heart disease Father   . Diabetes Mellitus II Father    Not on File  OBJECTIVE: Blood pressure (!) 162/102, pulse (!) 113, temperature (!) 101.3 F (38.5 C), temperature source Rectal, resp. rate 20, height 5\' 9"  (1.753 m), weight 55.3 kg, SpO2 100 %.  Physical Exam  Constitutional: He is oriented to person, place, and time.  Thin, chronically ill appearing male. Sleeping a lot per his sister but wakes easily.   HENT:  Mouth/Throat: No oral lesions. Normal dentition. No dental caries. No oropharyngeal exudate.  Eyes: Pupils are equal, round, and reactive to light. No scleral icterus.  Cardiovascular: Tachycardia present. Exam reveals gallop (?split S2).  Pulmonary/Chest: Effort normal and breath sounds normal.  Abdominal: Soft. He exhibits no distension. There is no tenderness.  Genitourinary:  Genitourinary Comments: R Nephrostomy tube in place with large red but clear drainage. No purulence.   Musculoskeletal: He exhibits no edema (no LE edema ).  Left UA swelling.   Lymphadenopathy:    He has no cervical adenopathy.  Neurological: He is alert and oriented to person, place, and time.  Skin: Skin is warm and dry. Capillary refill takes less than 2 seconds. No rash noted.  Psychiatric:  Nonverbal   Vitals reviewed.   Lab Results Lab Results  Component Value Date   WBC 12.8 (H) 04/02/2018   HGB 8.8 (L) 04/02/2018   HCT 30.1 (L) 04/02/2018   MCV 89.3 04/02/2018   PLT 225 04/02/2018    Lab Results  Component Value Date   CREATININE 0.83 04/02/2018   BUN 28 (H) 04/02/2018   NA 148 (H) 04/02/2018   K 4.0 04/02/2018   CL 113 (H) 04/02/2018   CO2 26 04/02/2018    Lab Results  Component Value Date   ALT 15  04/02/2018   AST 17 04/02/2018   ALKPHOS 103 04/02/2018   BILITOT 0.3 04/02/2018     Microbiology: Recent Results (from the past 240 hour(s))  Culture, Urine     Status: Abnormal   Collection Time: 03/25/18  9:30 AM  Result Value Ref Range Status   Specimen Description URINE, RANDOM  Final   Special Requests   Final    NONE Performed at [facility], [address] [place], [place] [address]    Culture >=  100,000 COLONIES/mL PROTEUS MIRABILIS (A)  Final   Report Status 03/28/2018 FINAL  Final   Organism ID, Bacteria PROTEUS MIRABILIS (A)  Final      Susceptibility   Proteus mirabilis - MIC*    AMPICILLIN >=32 RESISTANT Resistant     CEFAZOLIN 8 SENSITIVE Sensitive     CEFTRIAXONE <=1 SENSITIVE Sensitive     CIPROFLOXACIN >=4 RESISTANT Resistant     GENTAMICIN <=1 SENSITIVE Sensitive     IMIPENEM 2 SENSITIVE Sensitive     NITROFURANTOIN 256 RESISTANT Resistant     TRIMETH/SULFA >=320 RESISTANT Resistant     AMPICILLIN/SULBACTAM 16 INTERMEDIATE Intermediate     PIP/TAZO <=4 SENSITIVE Sensitive     * >=100,000 COLONIES/mL PROTEUS MIRABILIS  Culture, blood (Routine X 2) w Reflex to ID Panel     Status: None   Collection Time: 03/28/18 10:40 AM  Result Value Ref Range Status   Specimen Description BLOOD LEFT HAND  Final   Special Requests   Final    BOTTLES DRAWN AEROBIC ONLY Blood Culture results may not be optimal due to an inadequate volume of blood received in culture bottles   Culture   Final    NO GROWTH 5 DAYS Performed at [facility], [address] [place], [place] [address]    Report Status 04/02/2018 FINAL  Final  Culture, blood (Routine X 2) w Reflex to ID Panel     Status: None   Collection Time: 03/28/18 10:48 AM  Result Value Ref Range Status   Specimen Description BLOOD LEFT HAND  Final   Special Requests   Final    BOTTLES DRAWN AEROBIC ONLY Blood Culture adequate volume   Culture   Final    NO GROWTH 5 DAYS Performed at [facility], [address] [place], [place] [address]    Report Status 04/02/2018 FINAL  Final  Culture, Urine     Status: Abnormal   Collection Time: 03/28/18 11:14 AM  Result Value Ref Range Status   Specimen Description URINE, CATHETERIZED  Final   Special Requests   Final    NONE Performed at [facility], [address] [place], [place] [address]    Culture >=100,000 COLONIES/mL PROTEUS MIRABILIS (A)  Final   Report Status 03/30/2018 FINAL  Final   Organism ID, Bacteria PROTEUS MIRABILIS (A)  Final      Susceptibility   Proteus mirabilis - MIC*    AMPICILLIN >=32 RESISTANT Resistant     CEFAZOLIN 8 SENSITIVE Sensitive     CEFTRIAXONE <=1 SENSITIVE Sensitive     CIPROFLOXACIN >=4 RESISTANT Resistant     GENTAMICIN <=1 SENSITIVE Sensitive     IMIPENEM 2 SENSITIVE Sensitive     NITROFURANTOIN 128 RESISTANT Resistant     TRIMETH/SULFA >=320 RESISTANT Resistant     AMPICILLIN/SULBACTAM 16 INTERMEDIATE Intermediate     PIP/TAZO <=4 SENSITIVE Sensitive     * >=100,000 COLONIES/mL PROTEUS MIRABILIS    Rexene AlbertsStephanie Hadyn Azer, MSN, NP-C [facility] Cell: [phone] Pager: [phone]  04/02/2018 3:54 PM

## 2018-04-03 ENCOUNTER — Encounter (HOSPITAL_COMMUNITY): Payer: Medicare Other

## 2018-04-03 DIAGNOSIS — Z960 Presence of urogenital implants: Secondary | ICD-10-CM

## 2018-04-03 DIAGNOSIS — B957 Other staphylococcus as the cause of diseases classified elsewhere: Secondary | ICD-10-CM

## 2018-04-03 DIAGNOSIS — N390 Urinary tract infection, site not specified: Secondary | ICD-10-CM

## 2018-04-03 LAB — BASIC METABOLIC PANEL
Anion gap: 5 (ref 5–15)
Anion gap: 4 — ABNORMAL LOW (ref 5–15)
BUN: 21 mg/dL — ABNORMAL HIGH (ref 6–20)
BUN: 19 mg/dL (ref 6–20)
CALCIUM: 7.7 mg/dL — AB (ref 8.9–10.3)
CO2: 28 mmol/L (ref 22–32)
CO2: 28 mmol/L (ref 22–32)
CREATININE: 0.74 mg/dL (ref 0.61–1.24)
Calcium: 7.6 mg/dL — ABNORMAL LOW (ref 8.9–10.3)
Chloride: 117 mmol/L — ABNORMAL HIGH (ref 98–111)
Chloride: 114 mmol/L — ABNORMAL HIGH (ref 98–111)
Creatinine, Ser: 0.71 mg/dL (ref 0.61–1.24)
GFR calc Af Amer: >60 mL/min (ref >60)
GFR calc Af Amer: >60 mL/min (ref >60)
GFR calc non Af Amer: >60 mL/min (ref >60)
GLUCOSE: 116 mg/dL — AB (ref 70–99)
Glucose, Bld: 93 mg/dL (ref 70–99)
Potassium: 3.4 mmol/L — ABNORMAL LOW (ref 3.5–5.1)
Potassium: 3.7 mmol/L (ref 3.5–5.1)
Sodium: 150 mmol/L — ABNORMAL HIGH (ref 135–145)
Sodium: 146 mmol/L — ABNORMAL HIGH (ref 135–145)

## 2018-04-03 LAB — BLOOD CULTURE ID PANEL (REFLEXED)
Acinetobacter baumannii: NOT DETECTED
CANDIDA ALBICANS: NOT DETECTED
CANDIDA KRUSEI: NOT DETECTED
CANDIDA PARAPSILOSIS: NOT DETECTED
CANDIDA TROPICALIS: NOT DETECTED
Candida glabrata: NOT DETECTED
ENTEROBACTERIACEAE SPECIES: NOT DETECTED
Enterobacter cloacae complex: NOT DETECTED
Enterococcus species: NOT DETECTED
Escherichia coli: NOT DETECTED
HAEMOPHILUS INFLUENZAE: NOT DETECTED
KLEBSIELLA OXYTOCA: NOT DETECTED
KLEBSIELLA PNEUMONIAE: NOT DETECTED
Listeria monocytogenes: NOT DETECTED
METHICILLIN RESISTANCE: DETECTED — AB
Neisseria meningitidis: NOT DETECTED
Proteus species: NOT DETECTED
Pseudomonas aeruginosa: NOT DETECTED
SERRATIA MARCESCENS: NOT DETECTED
STREPTOCOCCUS PYOGENES: NOT DETECTED
Staphylococcus aureus (BCID): NOT DETECTED
Staphylococcus species: DETECTED — AB
Streptococcus agalactiae: NOT DETECTED
Streptococcus pneumoniae: NOT DETECTED
Streptococcus species: NOT DETECTED

## 2018-04-03 LAB — CBC
HCT: 28.1 % — ABNORMAL LOW (ref 39.0–52.0)
Hemoglobin: 8.1 g/dL — ABNORMAL LOW (ref 13.0–17.0)
MCH: 26 pg (ref 26.0–34.0)
MCHC: 28.8 g/dL — ABNORMAL LOW (ref 30.0–36.0)
MCV: 90.1 fL (ref 78.0–100.0)
Platelets: 157 10*3/uL (ref 150–400)
RBC: 3.12 MIL/uL — ABNORMAL LOW (ref 4.22–5.81)
RDW: 18.5 % — ABNORMAL HIGH (ref 11.5–15.5)
WBC: 9.8 10*3/uL (ref 4.0–10.5)

## 2018-04-03 LAB — GLUCOSE, CAPILLARY
GLUCOSE-CAPILLARY: 80 mg/dL (ref 70–99)
GLUCOSE-CAPILLARY: 145 mg/dL — AB (ref 70–99)
Glucose-Capillary: 105 mg/dL — ABNORMAL HIGH (ref 70–99)
Glucose-Capillary: 107 mg/dL — ABNORMAL HIGH (ref 70–99)

## 2018-04-03 LAB — HEMOGLOBIN AND HEMATOCRIT, BLOOD
HEMATOCRIT: 28.9 % — AB (ref 39.0–52.0)
HEMOGLOBIN: 8.5 g/dL — AB (ref 13.0–17.0)

## 2018-04-03 LAB — URINE CULTURE: Culture: NO GROWTH

## 2018-04-03 LAB — MAGNESIUM: Magnesium: 2 mg/dL (ref 1.7–2.4)

## 2018-04-03 LAB — PROCALCITONIN: PROCALCITONIN: 0.22 ng/mL

## 2018-04-03 MED ORDER — POTASSIUM CHLORIDE 20 MEQ PO PACK
40.0000 meq | PACK | Freq: Once | ORAL | Status: AC
  Administered 2018-04-03: 40 meq via ORAL
  Filled 2018-04-03: qty 2

## 2018-04-03 MED ORDER — FREE WATER
250.0000 mL | Freq: Three times a day (TID) | Status: DC
  Administered 2018-04-03 – 2018-04-04 (×6): 250 mL via ORAL

## 2018-04-03 NOTE — Progress Notes (Signed)
PROGRESS NOTE    Cody [name]  ZOX:[id number] DOB: [birth year] DOA: 03/10/2018 PCP: [name], MD   Brief Narrative:  52 year old male TBI, spastic left hemiplegia DM 2, hypothyroid, bipolar. Admitted 03/11/2018 increasing lethargy X 48 hours. Admitted with sepsis protocol started-found to have AKI creatinine 2.1 BUN 40 T-max 102.6. WBC 15 CT abdomen pelvis right obstructive uropathy 7X5 UVJ stone. CT head = extensive encephalomalacia slight interval enlargement of the ventricular system compared to 2009 CT. Presumed sepsis from acute pyelonephritis started on Vanco Zosyn given 4 L of fluid and kept on stepdown unit. Urologist was consulted underwent cystoscopy and placement of 6 French X 24 cm JJ stent 7/25. Hospitalization complicated by persisting fever and sepsis.  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  Nephrology was consulted.  Assessment & Plan:    Principal Problem:   Acute pyelonephritis Active Problems:   Depression   Diabetes mellitus without complication (HCC)   Hypertension   Spastic hemiplegia (HCC)   Sepsis (HCC)   AKI (acute kidney injury) (HCC)   Hydroureteronephrosis-right   Ureterovesical junction (UVJ) obstruction   Acute metabolic encephalopathy   Acute respiratory failure with hypoxia (HCC)   Pressure injury of skin   Right kidney stone   Hypernatremia   Aspiration into airway   Palliative care encounter   Diabetes insipidus (HCC)   Sepsis likely multifactorial  Persistent fever Pt with R obsteructive uropathy with 7 x 5 mm R ureterovesical junction stone causing hydroureteronephrosis and perinephric stranding (with multiple nonobstructing calculi measuring up to 10 mm) S/p cystoscopy with R retrograde ureteropyelogram, fluoscopic interpretation, placement of 6 french by 24 cm contour double J stent without tether on 7/25 Pt with recurrent fevers and infectious disease was consulted due to fever without a clear source.   He was  started on unasyn for possible aspiration pneumonia and then transitioned to augmentin.  He has completed course of treatment.  Patient continued to have fevers. Some concern for drug fever? Recurrent fevers since perc neph tube placement.  Last fever 8/16 around noon.   Repeat bcx pending (1/2 with coag neg staph, likely contaminant, follow) Urine cx pending (urine cx from 8/11 with proteus) CT chest with improved aeration and partial clearing of RUL pulm infiltrate  L buttock with decubitus ulcer (reviewed imaging with rads and no osteo) LLE with no evidence of acute bony abnormality on imaging Appreciate ID assistance, follow UE [place] for DVT Will continue ceftriaxone CT abdomen pelvis with R ureteral stent with mild residual hydronephrosis, delayed R renal nephrogram and swelling of R kidney with respect to L may indicate abnormal renal function 2/2 obstruction or infection - urology recommended perc nephrostomy tube placement.  Now s/p perc nephrostomy tube placement on 7/25. Urology planning for removal of double J stent next week.  Tachycardia: improved, follow  Severe hypernatremia thought to be secondary to central diabetes insipidus/acute kidney injury Worsening, follow - will check in PM, consider D5W Patient was seen by nephrology.   He was placed on desmopressin 20 mcg nasal BID and dextrose infusion.   Sodium level has improved  He has been tolerating his diet and water.   Discussed with nursing, encourage PO free water intake, at least 250 cc PO three times in between meals (and at least 250 cc 3 times day with meals) He will need continued encouragement to consume water even at skilled nursing facility.  We will continue with current dose of desmopressin intranasally.   Will need periodic monitoring  of his sodium levels.   If he develops hypernatremia despite these measures then further discussions will need to be held with family as the only way to provide him free water than  would be to place a PEG tube (discussed with sister this possibility today)  Cholelithiasis: seen on imaging, doesn't seem to have any abdominal pain.  Normal LFT's.  But large stone in gallbladder neck.  No evidence of cholecystitis.  Follow.   History of traumatic brain injury with left hemiplegia Patient is stable.  Patient has a shunt.  This was discussed with neurosurgery who did not feel like his symptoms were secondary to shunt malfunction.  Neurologically he appears to have improved.  He is probably close to his baseline.  Per neurosurgery, consider shunt evaluation after abx.   Chronic aspiration/oropharyngeal dysphagia He is on dysphagia 1 diet.  Speech therapy has seen.  Remains at high risk for recurrent aspirations.  Palliative medicine has been following.   History of bipolar disorder He is on medications which are being continued.  Stage 3 pressure injury  Noted in L buttock and heel (previous note mentioned R posterior thigh, but I did not note any here on my exam today).  Wound care.  No active infection appreciated.  Continue frequent turns.  F/u x ray L ankle.  Diabetes mellitus type 2 Continue long-acting insulin and SSI.  BG's stable.  Acute metabolic encephalopathy Most likely secondary to acute infection.  Appears close to baseline.  Stable..  Acute respiratory failure with hypoxia Secondary to aspiration.  Resolved.  No PE noted on CT angiogram done on 7/29.  Normocytic anemia Received transfusion for hb of 6.7 8/14 -> improved to 8.8.   Start iron No evidence of bleeding, follow.  Relatively stable, trend.  Essential hypertension Blood pressure is well controlled.  Patient is on metoprolol.  Obstructive uropathy Status post stent placement -> now s/p perc neph tube placed on 8/14   Imaging studies did suggest a retained guidewire as well as proximal aspect of stent may be in renal parenchyma.   Urology is aware of this.   Follow-up with urology  after discharge.  Foley removed.  Follow post voids.  Nonsustained Vtach: follow, no echo to eval EF.  Will continue to monitor.    Hypokalemia: replete, follow  DVT prophylaxis: lovenox (held on 8/14 with stent placement, resumed 8/16) Code Status: full  Family Communication: discussed with sister 8/14 Disposition Plan: pending improvement   Consultants:   Urology  Neurosurgery  ID  Nephrology  Palliative  Procedures:  On 7/25: Cystoscopy, right retrograde ureteropyelogram, fluoroscopic interpretation, placement of 6 French by 24 cm contour double-J stent without tether  Lower extremity Doppler study  Negative for DVT  8/14 R perc nephrostomy tube   Antimicrobials: Anti-infectives (From admission, onward)   Start     Dose/Rate Route Frequency Ordered Stop   03/31/18 1330  ciprofloxacin (CIPRO) IVPB 400 mg     400 mg 200 mL/hr over 60 Minutes Intravenous To Radiology 03/31/18 1236 03/31/18 1830   03/29/18 1430  cefTRIAXone (ROCEPHIN) 1 g in sodium chloride 0.9 % 100 mL IVPB     1 g 200 mL/hr over 30 Minutes Intravenous Every 24 hours 03/29/18 1430     03/19/18 1400  amoxicillin-clavulanate (AUGMENTIN) 875-125 MG per tablet 1 tablet     1 tablet Oral Every 12 hours 03/19/18 0911 03/23/18 2224   03/18/18 1700  piperacillin-tazobactam (ZOSYN) IVPB 3.375 g  Status:  Discontinued  3.375 g 12.5 mL/hr over 240 Minutes Intravenous Every 8 hours 03/18/18 1343 03/19/18 0910   03/17/18 1400  Ampicillin-Sulbactam (UNASYN) 3 g in sodium chloride 0.9 % 100 mL IVPB  Status:  Discontinued     3 g 200 mL/hr over 30 Minutes Intravenous Every 6 hours 03/17/18 1300 03/18/18 1337   03/14/18 1430  ceFEPIme (MAXIPIME) 2 g in sodium chloride 0.9 % 100 mL IVPB  Status:  Discontinued     2 g 200 mL/hr over 30 Minutes Intravenous Every 24 hours 03/14/18 1136 03/15/18 0956   03/14/18 0100  vancomycin (VANCOCIN) 500 mg in sodium chloride 0.9 % 100 mL IVPB  Status:  Discontinued     500  mg 100 mL/hr over 60 Minutes Intravenous Every 12 hours 03/13/18 1510 03/15/18 0956   03/11/18 2000  vancomycin (VANCOCIN) IVPB 1000 mg/200 mL premix  Status:  Discontinued     1,000 mg 200 mL/hr over 60 Minutes Intravenous Every 24 hours 03/10/18 2053 03/11/18 1342   03/11/18 1400  vancomycin (VANCOCIN) IVPB 750 mg/150 ml premix  Status:  Discontinued     750 mg 150 mL/hr over 60 Minutes Intravenous Every 12 hours 03/11/18 1342 03/13/18 1510   03/11/18 0400  piperacillin-tazobactam (ZOSYN) IVPB 3.375 g  Status:  Discontinued     3.375 g 12.5 mL/hr over 240 Minutes Intravenous Every 8 hours 03/10/18 2053 03/14/18 1136   03/10/18 2030  piperacillin-tazobactam (ZOSYN) IVPB 3.375 g     3.375 g 100 mL/hr over 30 Minutes Intravenous  Once 03/10/18 2023 03/10/18 2139   03/10/18 2030  vancomycin (VANCOCIN) IVPB 1000 mg/200 mL premix     1,000 mg 200 mL/hr over 60 Minutes Intravenous  Once 03/10/18 2023 03/10/18 2208     Subjective: Quiet today.  Objective: Vitals:   04/02/18 1200 04/02/18 2100 04/02/18 2340 04/03/18 0500  BP:  (!) 145/115  (!) 146/96  Pulse:  98 97 96  Resp:  (!) 25 (!) 24 18  Temp: (!) 101.3 F (38.5 C) 99.1 F (37.3 C)  98.4 F (36.9 C)  TempSrc: Rectal Axillary  Axillary  SpO2:  98% 97% 91%  Weight:    58.5 kg  Height:        Intake/Output Summary (Last 24 hours) at 04/03/2018 1019 Last data filed at 04/03/2018 0441 Gross per 24 hour  Intake 2067.63 ml  Output 925 ml  Net 1142.63 ml   Filed Weights   04/01/18 0632 04/02/18 0500 04/03/18 0500  Weight: 55.8 kg 55.3 kg 58.5 kg    Examination:  General: No acute distress. Cardiovascular: Heart sounds show a regular rate, and rhythm.  Lungs: Clear to auscultation bilaterally with good air movement. . Abdomen: Soft, nontender, nondistended.  Perc tube with red tinged fluid in bag.  Neurological: Alert and disoriented. Cranial nerves II through XII grossly intact. Skin: wounds not examined  today Extremities: No clubbing or cyanosis. No edema.   Data Reviewed: I have personally reviewed following labs and imaging studies  CBC: Recent Labs  Lab 03/31/18 0344 03/31/18 1616 04/01/18 0432 04/02/18 0432 04/03/18 0431  WBC 11.2* 12.1* 13.1* 12.8* 9.8  NEUTROABS  --  6.9  --   --   --   HGB 6.7* 7.7* 8.8* 8.8* 8.1*  HCT 22.7* 25.8* 29.4* 30.1* 28.1*  MCV 87.6 88.1 87.8 89.3 90.1  PLT 267 257 268 225 157   Basic Metabolic Panel: Recent Labs  Lab 03/30/18 0311 03/31/18 0344 04/01/18 0432 04/02/18 0432 04/03/18 0431  NA  146* 144 146* 148* 150*  K 4.8 3.4* 4.2 4.0 3.4*  CL 109 109 114* 113* 117*  CO2 27 30 26 26 28   GLUCOSE 98 121* 121* 98 93  BUN 23* 16 14 28* 21*  CREATININE 0.93 0.73 0.74 0.83 0.71  CALCIUM 7.8* 7.7* 7.6* 7.6* 7.6*  MG 1.9 2.0 1.8 2.1 2.0   GFR: Estimated Creatinine Clearance: 90.4 mL/min (by C-G formula based on SCr of 0.71 mg/dL). Liver Function Tests: Recent Labs  Lab 03/31/18 0344 04/02/18 0432  AST 20 17  ALT 15 15  ALKPHOS 85 103  BILITOT 0.4 0.3  PROT 8.5* 9.5*  ALBUMIN 1.4* 1.6*   No results for input(s): LIPASE, AMYLASE in the last 168 hours. No results for input(s): AMMONIA in the last 168 hours. Coagulation Profile: Recent Labs  Lab 03/31/18 1616  INR 1.29   Cardiac Enzymes: No results for input(s): CKTOTAL, CKMB, CKMBINDEX, TROPONINI in the last 168 hours. BNP (last 3 results) No results for input(s): PROBNP in the last 8760 hours. HbA1C: No results for input(s): HGBA1C in the last 72 hours. CBG: Recent Labs  Lab 04/02/18 0748 04/02/18 1240 04/02/18 1543 04/02/18 2139 04/03/18 0759  GLUCAP 112* 105* 113* 114* 80   Lipid Profile: No results for input(s): CHOL, HDL, LDLCALC, TRIG, CHOLHDL, LDLDIRECT in the last 72 hours. Thyroid Function Tests: No results for input(s): TSH, T4TOTAL, FREET4, T3FREE, THYROIDAB in the last 72 hours. Anemia Panel: No results for input(s): VITAMINB12, FOLATE, FERRITIN, TIBC,  IRON, RETICCTPCT in the last 72 hours. Sepsis Labs: Recent Labs  Lab 03/30/18 0311 03/31/18 0344 04/02/18 0432 04/03/18 0431  PROCALCITON 0.34 0.23 0.30 0.22    Recent Results (from the past 240 hour(s))  Culture, Urine     Status: Abnormal   Collection Time: 03/25/18  9:30 AM  Result Value Ref Range Status   Specimen Description URINE, RANDOM  Final   Special Requests   Final    NONE Performed at [facility], [address] [place], [place] [address]    Culture >=100,000 COLONIES/mL PROTEUS MIRABILIS (A)  Final   Report Status 03/28/2018 FINAL  Final   Organism ID, Bacteria PROTEUS MIRABILIS (A)  Final      Susceptibility   Proteus mirabilis - MIC*    AMPICILLIN >=32 RESISTANT Resistant     CEFAZOLIN 8 SENSITIVE Sensitive     CEFTRIAXONE <=1 SENSITIVE Sensitive     CIPROFLOXACIN >=4 RESISTANT Resistant     GENTAMICIN <=1 SENSITIVE Sensitive     IMIPENEM 2 SENSITIVE Sensitive     NITROFURANTOIN 256 RESISTANT Resistant     TRIMETH/SULFA >=320 RESISTANT Resistant     AMPICILLIN/SULBACTAM 16 INTERMEDIATE Intermediate     PIP/TAZO <=4 SENSITIVE Sensitive     * >=100,000 COLONIES/mL PROTEUS MIRABILIS  Culture, blood (Routine X 2) w Reflex to ID Panel     Status: None   Collection Time: 03/28/18 10:40 AM  Result Value Ref Range Status   Specimen Description BLOOD LEFT HAND  Final   Special Requests   Final    BOTTLES DRAWN AEROBIC ONLY Blood Culture results may not be optimal due to an inadequate volume of blood received in culture bottles   Culture   Final    NO GROWTH 5 DAYS Performed at [facility], [address], [place], [place] [address]    Report Status 04/02/2018 FINAL  Final  Culture, blood (Routine X 2) w Reflex to ID Panel  Status: None   Collection Time: 03/28/18 10:48 AM  Result Value Ref Range Status   Specimen Description BLOOD LEFT HAND  Final   Special Requests   Final    BOTTLES DRAWN AEROBIC ONLY Blood Culture adequate volume    Culture   Final    NO GROWTH 5 DAYS Performed at [facility], [address] [place], [place] [address]    Report Status 04/02/2018 FINAL  Final  Culture, Urine     Status: Abnormal   Collection Time: 03/28/18 11:14 AM  Result Value Ref Range Status   Specimen Description URINE, CATHETERIZED  Final   Special Requests   Final    NONE Performed at [facility], [address] [place], [place] [address]    Culture >=100,000 COLONIES/mL PROTEUS MIRABILIS (A)  Final   Report Status 03/30/2018 FINAL  Final   Organism ID, Bacteria PROTEUS MIRABILIS (A)  Final      Susceptibility   Proteus mirabilis - MIC*    AMPICILLIN >=32 RESISTANT Resistant     CEFAZOLIN 8 SENSITIVE Sensitive     CEFTRIAXONE <=1 SENSITIVE Sensitive     CIPROFLOXACIN >=4 RESISTANT Resistant     GENTAMICIN <=1 SENSITIVE Sensitive     IMIPENEM 2 SENSITIVE Sensitive     NITROFURANTOIN 128 RESISTANT Resistant     TRIMETH/SULFA >=320 RESISTANT Resistant     AMPICILLIN/SULBACTAM 16 INTERMEDIATE Intermediate     PIP/TAZO <=4 SENSITIVE Sensitive     * >=100,000 COLONIES/mL PROTEUS MIRABILIS  Culture, blood (routine x 2)     Status: None (Preliminary result)   Collection Time: 04/02/18  8:07 AM  Result Value Ref Range Status   Specimen Description BLOOD LEFT HAND  Final   Special Requests   Final    BOTTLES DRAWN AEROBIC AND ANAEROBIC Blood Culture adequate volume   Culture  Setup Time   Final    GRAM POSITIVE COCCI IN CLUSTERS AEROBIC BOTTLE ONLY CRITICAL RESULT CALLED TO, READ BACK BY AND VERIFIED WITH: Francis DowseHARMD F WILSON [id number] MLM Performed at [facility], [address] [place], [place] [address]    Culture GRAM POSITIVE COCCI  Final   Report Status PENDING  Incomplete  Blood Culture ID Panel (Reflexed)     Status: Abnormal   Collection Time: 04/02/18  8:07 AM  Result Value Ref Range Status   Enterococcus species NOT DETECTED NOT DETECTED Final   Listeria monocytogenes NOT DETECTED NOT  DETECTED Final   Staphylococcus species DETECTED (A) NOT DETECTED Final    Comment: Methicillin (oxacillin) resistant coagulase negative staphylococcus. Possible blood culture contaminant (unless isolated from more than one blood culture draw or clinical case suggests pathogenicity). No antibiotic treatment is indicated for blood  culture contaminants. CRITICAL RESULT CALLED TO, READ BACK BY AND VERIFIED WITH: PHARMD F WILSON [id number] MLM    Staphylococcus aureus NOT DETECTED NOT DETECTED Final   Methicillin resistance DETECTED (A) NOT DETECTED Final    Comment: CRITICAL RESULT CALLED TO, READ BACK BY AND VERIFIED WITH: PHARMD F WILSON [id number] MLM    Streptococcus species NOT DETECTED NOT DETECTED Final   Streptococcus agalactiae NOT DETECTED NOT DETECTED Final   Streptococcus pneumoniae NOT DETECTED NOT DETECTED Final   Streptococcus pyogenes NOT DETECTED NOT DETECTED Final   Acinetobacter baumannii NOT DETECTED NOT DETECTED Final   Enterobacteriaceae species NOT DETECTED NOT DETECTED Final   Enterobacter cloacae complex NOT DETECTED NOT DETECTED Final   Escherichia coli NOT DETECTED  NOT DETECTED Final   Klebsiella oxytoca NOT DETECTED NOT DETECTED Final   Klebsiella pneumoniae NOT DETECTED NOT DETECTED Final   Proteus species NOT DETECTED NOT DETECTED Final   Serratia marcescens NOT DETECTED NOT DETECTED Final   Haemophilus influenzae NOT DETECTED NOT DETECTED Final   Neisseria meningitidis NOT DETECTED NOT DETECTED Final   Pseudomonas aeruginosa NOT DETECTED NOT DETECTED Final   Candida albicans NOT DETECTED NOT DETECTED Final   Candida glabrata NOT DETECTED NOT DETECTED Final   Candida krusei NOT DETECTED NOT DETECTED Final   Candida parapsilosis NOT DETECTED NOT DETECTED Final   Candida tropicalis NOT DETECTED NOT DETECTED Final    Comment: Performed at [facility], [address] [place], [place] [address]         Radiology Studies: Ct Chest W  Contrast  Result Date: 04/02/2018 CLINICAL DATA:  Persistent fever EXAM: CT CHEST WITH CONTRAST TECHNIQUE: Multidetector CT imaging of the chest was performed during intravenous contrast administration. CONTRAST:  OMNIPAQUE IOHEXOL 300 MG/ML  SOLN COMPARISON:  Radiograph 03/30/2018, CT chest 03/15/2018, CT abdomen 03/14/2018 FINDINGS: Cardiovascular: Nonaneurysmal aorta. Common origin of the right brachiocephalic and left common carotid artery. Heart size upper normal. No pericardial effusion Mediastinum/Nodes: Midline trachea. Generous thyroid gland with subcentimeter hypodense nodules. Subcentimeter mediastinal lymph nodes. Esophagus within normal limits. Lungs/Pleura: Improved aeration since comparison CT 03/15/2018. Partial but incomplete clearing of right upper lobe pulmonary infiltrate. Minimal ground-glass density in the left upper lobe. No significant pleural effusion. Upper Abdomen: Fatty enlargement of the left adrenal gland as before. No acute abnormality. Musculoskeletal: No chest wall abnormality. No acute or significant osseous findings. Degenerative changes. IMPRESSION: 1. Improved aeration bilaterally since CT 03/15/2018. Partial but incomplete clearing primarily of right upper lobe pulmonary infiltrate with few scattered foci of ground-glass density elsewhere in the lungs. Electronically Signed   By: Jasmine Pang M.D.   On: 04/02/2018 21:22   Dg Ankle Left Port  Result Date: 04/02/2018 CLINICAL DATA:  52 year old male with a history of left ankle ulceration EXAM: PORTABLE LEFT ANKLE - 2 VIEW COMPARISON:  None. FINDINGS: Osteopenia. No evidence of acute displaced fracture, however, portions of the bone are excluded. No erosive changes. Degenerative changes of the tibiotalar joint, subtalar joint, and the hindfoot. Beaking at the talus. Degenerative changes of the midfoot. IMPRESSION: Osteopenia, with no definite acute bony abnormality. Evidence of tarsal coalition Electronically Signed    By: Gilmer Mor D.O.   On: 04/02/2018 13:58        Scheduled Meds: . sodium chloride   Intravenous Once  . baclofen  10 mg Oral TID  . clonazePAM  0.5 mg Oral Q24H  . clonazePAM  1 mg Oral BID  . collagenase   Topical Daily  . desmopressin  20 mcg Nasal BID  . divalproex  500 mg Oral TID  . enoxaparin (LOVENOX) injection  40 mg Subcutaneous Q24H  . feeding supplement (ENSURE ENLIVE)  237 mL Oral TID BM  . feeding supplement (PRO-STAT SUGAR FREE 64)  30 mL Oral BID BM  . ferrous sulfate  325 mg Oral Q breakfast  . free water  250 mL Oral TID WC  . insulin aspart  0-5 Units Subcutaneous QHS  . insulin aspart  0-9 Units Subcutaneous TID WC  . insulin detemir  10 Units Subcutaneous Daily  . mouth rinse  15 mL Mouth Rinse BID  . metoprolol tartrate  50 mg Oral QHS  . metoprolol tartrate  75 mg Oral  Daily  . multivitamin with minerals  1 tablet Oral Daily  . polyethylene glycol  17 g Oral Daily  . potassium chloride  40 mEq Oral Once  . QUEtiapine  125 mg Oral QHS  . QUEtiapine  150 mg Oral Daily  . selenium sulfide  1 application Topical Q T,Th,Sat-1800  . sertraline  100 mg Oral Daily  . sodium chloride flush  5 mL Intracatheter Q8H   Continuous Infusions: . sodium chloride 10 mL/hr at 04/02/18 1449  . cefTRIAXone (ROCEPHIN)  IV 1 g (04/02/18 1451)     LOS: 23 days    Time spent: over 30 min MDM mod with multiple medical problems.    Lacretia Nicks, MD [facility] Pager [phone]  If 7PM-7AM, please contact night-coverage [email or web address] Password [facility] 04/03/2018, 10:19 AM

## 2018-04-03 NOTE — Progress Notes (Signed)
INFECTIOUS DISEASE PROGRESS NOTE  ID: Cody [name] is a 52 y.o. male with  Principal Problem:   Acute pyelonephritis Active Problems:   Depression   Diabetes mellitus without complication (HCC)   Hypertension   Spastic hemiplegia (HCC)   Sepsis (HCC)   AKI (acute kidney injury) (HCC)   Hydroureteronephrosis-right   Ureterovesical junction (UVJ) obstruction   Acute metabolic encephalopathy   Acute respiratory failure with hypoxia (HCC)   Pressure injury of skin   Right kidney stone   Hypernatremia   Aspiration into airway   Palliative care encounter   Diabetes insipidus (HCC)  Subjective: Being fed by staff  Abtx:  Anti-infectives (From admission, onward)   Start     Dose/Rate Route Frequency Ordered Stop   03/31/18 1330  ciprofloxacin (CIPRO) IVPB 400 mg     400 mg 200 mL/hr over 60 Minutes Intravenous To Radiology 03/31/18 1236 03/31/18 1830   03/29/18 1430  cefTRIAXone (ROCEPHIN) 1 g in sodium chloride 0.9 % 100 mL IVPB     1 g 200 mL/hr over 30 Minutes Intravenous Every 24 hours 03/29/18 1430     03/19/18 1400  amoxicillin-clavulanate (AUGMENTIN) 875-125 MG per tablet 1 tablet     1 tablet Oral Every 12 hours 03/19/18 0911 03/23/18 2224   03/18/18 1700  piperacillin-tazobactam (ZOSYN) IVPB 3.375 g  Status:  Discontinued     3.375 g 12.5 mL/hr over 240 Minutes Intravenous Every 8 hours 03/18/18 1343 03/19/18 0910   03/17/18 1400  Ampicillin-Sulbactam (UNASYN) 3 g in sodium chloride 0.9 % 100 mL IVPB  Status:  Discontinued     3 g 200 mL/hr over 30 Minutes Intravenous Every 6 hours 03/17/18 1300 03/18/18 1337   03/14/18 1430  ceFEPIme (MAXIPIME) 2 g in sodium chloride 0.9 % 100 mL IVPB  Status:  Discontinued     2 g 200 mL/hr over 30 Minutes Intravenous Every 24 hours 03/14/18 1136 03/15/18 0956   03/14/18 0100  vancomycin (VANCOCIN) 500 mg in sodium chloride 0.9 % 100 mL IVPB  Status:  Discontinued     500 mg 100 mL/hr over 60 Minutes Intravenous Every 12 hours  03/13/18 1510 03/15/18 0956   03/11/18 2000  vancomycin (VANCOCIN) IVPB 1000 mg/200 mL premix  Status:  Discontinued     1,000 mg 200 mL/hr over 60 Minutes Intravenous Every 24 hours 03/10/18 2053 03/11/18 1342   03/11/18 1400  vancomycin (VANCOCIN) IVPB 750 mg/150 ml premix  Status:  Discontinued     750 mg 150 mL/hr over 60 Minutes Intravenous Every 12 hours 03/11/18 1342 03/13/18 1510   03/11/18 0400  piperacillin-tazobactam (ZOSYN) IVPB 3.375 g  Status:  Discontinued     3.375 g 12.5 mL/hr over 240 Minutes Intravenous Every 8 hours 03/10/18 2053 03/14/18 1136   03/10/18 2030  piperacillin-tazobactam (ZOSYN) IVPB 3.375 g     3.375 g 100 mL/hr over 30 Minutes Intravenous  Once 03/10/18 2023 03/10/18 2139   03/10/18 2030  vancomycin (VANCOCIN) IVPB 1000 mg/200 mL premix     1,000 mg 200 mL/hr over 60 Minutes Intravenous  Once 03/10/18 2023 03/10/18 2208      Medications:  Scheduled: . sodium chloride   Intravenous Once  . baclofen  10 mg Oral TID  . clonazePAM  0.5 mg Oral Q24H  . clonazePAM  1 mg Oral BID  . collagenase   Topical Daily  . desmopressin  20 mcg Nasal BID  . divalproex  500 mg Oral TID  . enoxaparin (  LOVENOX) injection  40 mg Subcutaneous Q24H  . feeding supplement (ENSURE ENLIVE)  237 mL Oral TID BM  . feeding supplement (PRO-STAT SUGAR FREE 64)  30 mL Oral BID BM  . ferrous sulfate  325 mg Oral Q breakfast  . free water  250 mL Oral TID WC  . insulin aspart  0-5 Units Subcutaneous QHS  . insulin aspart  0-9 Units Subcutaneous TID WC  . insulin detemir  10 Units Subcutaneous Daily  . mouth rinse  15 mL Mouth Rinse BID  . metoprolol tartrate  50 mg Oral QHS  . metoprolol tartrate  75 mg Oral Daily  . multivitamin with minerals  1 tablet Oral Daily  . polyethylene glycol  17 g Oral Daily  . QUEtiapine  125 mg Oral QHS  . QUEtiapine  150 mg Oral Daily  . selenium sulfide  1 application Topical Q T,Th,Sat-1800  . sertraline  100 mg Oral Daily  . sodium  chloride flush  5 mL Intracatheter Q8H    Objective: Vital signs in last 24 hours: Temp:  [98.4 F (36.9 C)-99.1 F (37.3 C)] 98.4 F (36.9 C) (08/17 0500) Pulse Rate:  [96-103] 103 (08/17 1022) Resp:  [18-25] 18 (08/17 0500) BP: (139-146)/(86-115) 139/86 (08/17 1022) SpO2:  [91 %-98 %] 91 % (08/17 0500) Weight:  [58.5 kg] 58.5 kg (08/17 0500)   General appearance: alert, no distress and tracks to voice but does not answer questions Resp: clear to auscultation bilaterally Cardio: regular rate and rhythm GI: normal findings: bowel sounds normal and soft, non-tender  Lab Results Recent Labs    04/02/18 0432 04/03/18 0431  WBC 12.8* 9.8  HGB 8.8* 8.1*  HCT 30.1* 28.1*  NA 148* 150*  K 4.0 3.4*  CL 113* 117*  CO2 26 28  BUN 28* 21*  CREATININE 0.83 0.71   Liver Panel Recent Labs    04/02/18 0432  PROT 9.5*  ALBUMIN 1.6*  AST 17  ALT 15  ALKPHOS 103  BILITOT 0.3   Sedimentation Rate No results for input(s): ESRSEDRATE in the last 72 hours. C-Reactive Protein No results for input(s): CRP in the last 72 hours.  Microbiology: Recent Results (from the past 240 hour(s))  Culture, Urine     Status: Abnormal   Collection Time: 03/25/18  9:30 AM  Result Value Ref Range Status   Specimen Description URINE, RANDOM  Final   Special Requests   Final    NONE Performed at [facility], [address], [place], [place] [address]    Culture >=100,000 COLONIES/mL PROTEUS MIRABILIS (A)  Final   Report Status 03/28/2018 FINAL  Final   Organism ID, Bacteria PROTEUS MIRABILIS (A)  Final      Susceptibility   Proteus mirabilis - MIC*    AMPICILLIN >=32 RESISTANT Resistant     CEFAZOLIN 8 SENSITIVE Sensitive     CEFTRIAXONE <=1 SENSITIVE Sensitive     CIPROFLOXACIN >=4 RESISTANT Resistant     GENTAMICIN <=1 SENSITIVE Sensitive     IMIPENEM 2 SENSITIVE Sensitive     NITROFURANTOIN 256 RESISTANT Resistant     TRIMETH/SULFA >=320 RESISTANT Resistant      AMPICILLIN/SULBACTAM 16 INTERMEDIATE Intermediate     PIP/TAZO <=4 SENSITIVE Sensitive     * >=100,000 COLONIES/mL PROTEUS MIRABILIS  Culture, blood (Routine X 2) w Reflex to ID Panel     Status: None   Collection Time: 03/28/18 10:40 AM  Result Value Ref Range Status   Specimen Description BLOOD  LEFT HAND  Final   Special Requests   Final    BOTTLES DRAWN AEROBIC ONLY Blood Culture results may not be optimal due to an inadequate volume of blood received in culture bottles   Culture   Final    NO GROWTH 5 DAYS Performed at [facility], [address] [place], [place] [address]    Report Status 04/02/2018 FINAL  Final  Culture, blood (Routine X 2) w Reflex to ID Panel     Status: None   Collection Time: 03/28/18 10:48 AM  Result Value Ref Range Status   Specimen Description BLOOD LEFT HAND  Final   Special Requests   Final    BOTTLES DRAWN AEROBIC ONLY Blood Culture adequate volume   Culture   Final    NO GROWTH 5 DAYS Performed at [facility], [address] [place], [place] [address]    Report Status 04/02/2018 FINAL  Final  Culture, Urine     Status: Abnormal   Collection Time: 03/28/18 11:14 AM  Result Value Ref Range Status   Specimen Description URINE, CATHETERIZED  Final   Special Requests   Final    NONE Performed at [facility], [address] [place], [place] [address]    Culture >=100,000 COLONIES/mL PROTEUS MIRABILIS (A)  Final   Report Status 03/30/2018 FINAL  Final   Organism ID, Bacteria PROTEUS MIRABILIS (A)  Final      Susceptibility   Proteus mirabilis - MIC*    AMPICILLIN >=32 RESISTANT Resistant     CEFAZOLIN 8 SENSITIVE Sensitive     CEFTRIAXONE <=1 SENSITIVE Sensitive     CIPROFLOXACIN >=4 RESISTANT Resistant     GENTAMICIN <=1 SENSITIVE Sensitive     IMIPENEM 2 SENSITIVE Sensitive     NITROFURANTOIN 128 RESISTANT Resistant     TRIMETH/SULFA >=320 RESISTANT Resistant     AMPICILLIN/SULBACTAM 16 INTERMEDIATE Intermediate      PIP/TAZO <=4 SENSITIVE Sensitive     * >=100,000 COLONIES/mL PROTEUS MIRABILIS  Culture, blood (routine x 2)     Status: None (Preliminary result)   Collection Time: 04/02/18  8:07 AM  Result Value Ref Range Status   Specimen Description BLOOD LEFT HAND  Final   Special Requests   Final    BOTTLES DRAWN AEROBIC AND ANAEROBIC Blood Culture adequate volume   Culture  Setup Time   Final    GRAM POSITIVE COCCI IN CLUSTERS AEROBIC BOTTLE ONLY CRITICAL RESULT CALLED TO, READ BACK BY AND VERIFIED WITH: Francis DowseHARMD F WILSON [phone] MLM Performed at [facility], [address] [place], [place] [address]    Culture GRAM POSITIVE COCCI  Final   Report Status PENDING  Incomplete  Blood Culture ID Panel (Reflexed)     Status: Abnormal   Collection Time: 04/02/18  8:07 AM  Result Value Ref Range Status   Enterococcus species NOT DETECTED NOT DETECTED Final   Listeria monocytogenes NOT DETECTED NOT DETECTED Final   Staphylococcus species DETECTED (A) NOT DETECTED Final    Comment: Methicillin (oxacillin) resistant coagulase negative staphylococcus. Possible blood culture contaminant (unless isolated from more than one blood culture draw or clinical case suggests pathogenicity). No antibiotic treatment is indicated for blood  culture contaminants. CRITICAL RESULT CALLED TO, READ BACK BY AND VERIFIED WITH: PHARMD F WILSON [phone] MLM    Staphylococcus aureus NOT DETECTED NOT DETECTED Final   Methicillin resistance DETECTED (A) NOT DETECTED Final    Comment: CRITICAL RESULT  CALLED TO, READ BACK BY AND VERIFIED WITH: PHARMD F WILSON [id number] MLM    Streptococcus species NOT DETECTED NOT DETECTED Final   Streptococcus agalactiae NOT DETECTED NOT DETECTED Final   Streptococcus pneumoniae NOT DETECTED NOT DETECTED Final   Streptococcus pyogenes NOT DETECTED NOT DETECTED Final   Acinetobacter baumannii NOT DETECTED NOT DETECTED Final   Enterobacteriaceae species NOT DETECTED NOT DETECTED  Final   Enterobacter cloacae complex NOT DETECTED NOT DETECTED Final   Escherichia coli NOT DETECTED NOT DETECTED Final   Klebsiella oxytoca NOT DETECTED NOT DETECTED Final   Klebsiella pneumoniae NOT DETECTED NOT DETECTED Final   Proteus species NOT DETECTED NOT DETECTED Final   Serratia marcescens NOT DETECTED NOT DETECTED Final   Haemophilus influenzae NOT DETECTED NOT DETECTED Final   Neisseria meningitidis NOT DETECTED NOT DETECTED Final   Pseudomonas aeruginosa NOT DETECTED NOT DETECTED Final   Candida albicans NOT DETECTED NOT DETECTED Final   Candida glabrata NOT DETECTED NOT DETECTED Final   Candida krusei NOT DETECTED NOT DETECTED Final   Candida parapsilosis NOT DETECTED NOT DETECTED Final   Candida tropicalis NOT DETECTED NOT DETECTED Final    Comment: Performed at [facility], [address], [place], [place] [address]  Culture, Urine     Status: None   Collection Time: 04/02/18  1:32 PM  Result Value Ref Range Status   Specimen Description URINE, RANDOM  Final   Special Requests NONE  Final   Culture   Final    NO GROWTH Performed at [facility], [address] [place], [place] [address]    Report Status 04/03/2018 FINAL  Final    Studies/Results: Ct Chest W Contrast  Result Date: 04/02/2018 CLINICAL DATA:  Persistent fever EXAM: CT CHEST WITH CONTRAST TECHNIQUE: Multidetector CT imaging of the chest was performed during intravenous contrast administration. CONTRAST:  OMNIPAQUE IOHEXOL 300 MG/ML  SOLN COMPARISON:  Radiograph 03/30/2018, CT chest 03/15/2018, CT abdomen 03/14/2018 FINDINGS: Cardiovascular: Nonaneurysmal aorta. Common origin of the right brachiocephalic and left common carotid artery. Heart size upper normal. No pericardial effusion Mediastinum/Nodes: Midline trachea. Generous thyroid gland with subcentimeter hypodense nodules. Subcentimeter mediastinal lymph nodes. Esophagus within normal limits. Lungs/Pleura: Improved aeration since  comparison CT 03/15/2018. Partial but incomplete clearing of right upper lobe pulmonary infiltrate. Minimal ground-glass density in the left upper lobe. No significant pleural effusion. Upper Abdomen: Fatty enlargement of the left adrenal gland as before. No acute abnormality. Musculoskeletal: No chest wall abnormality. No acute or significant osseous findings. Degenerative changes. IMPRESSION: 1. Improved aeration bilaterally since CT 03/15/2018. Partial but incomplete clearing primarily of right upper lobe pulmonary infiltrate with few scattered foci of ground-glass density elsewhere in the lungs. Electronically Signed   By: Jasmine Pang M.D.   On: 04/02/2018 21:22   Dg Ankle Left Port  Result Date: 04/02/2018 CLINICAL DATA:  52 year old male with a history of left ankle ulceration EXAM: PORTABLE LEFT ANKLE - 2 VIEW COMPARISON:  None. FINDINGS: Osteopenia. No evidence of acute displaced fracture, however, portions of the bone are excluded. No erosive changes. Degenerative changes of the tibiotalar joint, subtalar joint, and the hindfoot. Beaking at the talus. Degenerative changes of the midfoot. IMPRESSION: Osteopenia, with no definite acute bony abnormality. Evidence of tarsal coalition Electronically Signed   By: Gilmer Mor D.O.   On: 04/02/2018 13:58     Assessment/Plan: UTI- proteus Perc Nephrostomy 8-14, double J stent BCx 1/2 MRSE  Total days of antibiotics: 6 ceftriaxone  His fever appears better today, more alert today Will assume his BCx is a contaminant Will continue to watch WBC and temp LUE doppler pending (he is on LMWH)         Johny SaxJeffrey Oakes Mccready MD, FACP Infectious Diseases (pager) [phone] [email or web address] 04/03/2018, 12:57 PM  LOS: 23 days

## 2018-04-03 NOTE — Progress Notes (Signed)
PHARMACY - PHYSICIAN COMMUNICATION CRITICAL VALUE ALERT - BLOOD CULTURE IDENTIFICATION (BCID)  Results for orders placed or performed during the hospital encounter of 03/10/18  Blood Culture ID Panel (Reflexed) (Collected: 04/02/2018  8:07 AM)  Result Value Ref Range   Enterococcus species NOT DETECTED NOT DETECTED   Listeria monocytogenes NOT DETECTED NOT DETECTED   Staphylococcus species DETECTED (A) NOT DETECTED   Staphylococcus aureus NOT DETECTED NOT DETECTED   Methicillin resistance DETECTED (A) NOT DETECTED   Streptococcus species NOT DETECTED NOT DETECTED   Streptococcus agalactiae NOT DETECTED NOT DETECTED   Streptococcus pneumoniae NOT DETECTED NOT DETECTED   Streptococcus pyogenes NOT DETECTED NOT DETECTED   Acinetobacter baumannii NOT DETECTED NOT DETECTED   Enterobacteriaceae species NOT DETECTED NOT DETECTED   Enterobacter cloacae complex NOT DETECTED NOT DETECTED   Escherichia coli NOT DETECTED NOT DETECTED   Klebsiella oxytoca NOT DETECTED NOT DETECTED   Klebsiella pneumoniae NOT DETECTED NOT DETECTED   Proteus species NOT DETECTED NOT DETECTED   Serratia marcescens NOT DETECTED NOT DETECTED   Haemophilus influenzae NOT DETECTED NOT DETECTED   Neisseria meningitidis NOT DETECTED NOT DETECTED   Pseudomonas aeruginosa NOT DETECTED NOT DETECTED   Candida albicans NOT DETECTED NOT DETECTED   Candida glabrata NOT DETECTED NOT DETECTED   Candida krusei NOT DETECTED NOT DETECTED   Candida parapsilosis NOT DETECTED NOT DETECTED   Candida tropicalis NOT DETECTED NOT DETECTED    Name of physician (or Provider) Contacted: Powell  Changes to prescribed antibiotics required: No changes for now, continue ceftriaxone.   Severiano GilbertWilson, Frank Rhea 04/03/2018  9:17 AM

## 2018-04-04 ENCOUNTER — Inpatient Hospital Stay (HOSPITAL_COMMUNITY): Payer: Medicare Other

## 2018-04-04 DIAGNOSIS — M79609 Pain in unspecified limb: Secondary | ICD-10-CM

## 2018-04-04 LAB — CBC
HEMATOCRIT: 29.9 % — AB (ref 39.0–52.0)
Hemoglobin: 8.6 g/dL — ABNORMAL LOW (ref 13.0–17.0)
MCH: 26.1 pg (ref 26.0–34.0)
MCHC: 28.8 g/dL — AB (ref 30.0–36.0)
MCV: 90.6 fL (ref 78.0–100.0)
Platelets: 141 10*3/uL — ABNORMAL LOW (ref 150–400)
RBC: 3.3 MIL/uL — ABNORMAL LOW (ref 4.22–5.81)
RDW: 18.1 % — ABNORMAL HIGH (ref 11.5–15.5)
WBC: 8.9 10*3/uL (ref 4.0–10.5)

## 2018-04-04 LAB — BASIC METABOLIC PANEL
Anion gap: 7 (ref 5–15)
BUN: 18 mg/dL (ref 6–20)
CO2: 27 mmol/L (ref 22–32)
CREATININE: 0.71 mg/dL (ref 0.61–1.24)
Calcium: 7.9 mg/dL — ABNORMAL LOW (ref 8.9–10.3)
Chloride: 113 mmol/L — ABNORMAL HIGH (ref 98–111)
GFR calc non Af Amer: >60 mL/min (ref >60)
Glucose, Bld: 132 mg/dL — ABNORMAL HIGH (ref 70–99)
Potassium: 3.4 mmol/L — ABNORMAL LOW (ref 3.5–5.1)
Sodium: 147 mmol/L — ABNORMAL HIGH (ref 135–145)

## 2018-04-04 LAB — GLUCOSE, CAPILLARY
GLUCOSE-CAPILLARY: 104 mg/dL — AB (ref 70–99)
Glucose-Capillary: 82 mg/dL (ref 70–99)
Glucose-Capillary: 113 mg/dL — ABNORMAL HIGH (ref 70–99)
Glucose-Capillary: 111 mg/dL — ABNORMAL HIGH (ref 70–99)
Glucose-Capillary: 140 mg/dL — ABNORMAL HIGH (ref 70–99)

## 2018-04-04 LAB — MAGNESIUM: Magnesium: 2 mg/dL (ref 1.7–2.4)

## 2018-04-04 MED ORDER — FREE WATER: 250.0000 mL | Freq: Three times a day (TID) | Status: DC

## 2018-04-04 MED ORDER — POTASSIUM CHLORIDE CRYS ER 20 MEQ PO TBCR
40.0000 meq | EXTENDED_RELEASE_TABLET | Freq: Once | ORAL | Status: AC
  Administered 2018-04-04: 40 meq via ORAL
  Filled 2018-04-04: qty 2

## 2018-04-04 MED ORDER — FREE WATER
250.0000 mL | Freq: Every day | Status: DC
  Administered 2018-04-04 – 2018-04-07 (×16): 250 mL via ORAL

## 2018-04-04 NOTE — Progress Notes (Signed)
VASCULAR LAB PRELIMINARY  PRELIMINARY  PRELIMINARY  PRELIMINARY  Left upper extremity venous duplex completed.    Preliminary report:  There is no obvious evidence of DVT or SVT noted in the visualized veins of the left upper extremity.   Oneida Mckamey, RVT 04/04/2018, 6:23 PM

## 2018-04-04 NOTE — Progress Notes (Signed)
INFECTIOUS DISEASE PROGRESS NOTE  ID: Cody [name] is a 52 y.o. male with  Principal Problem:   Acute pyelonephritis Active Problems:   Depression   Diabetes mellitus without complication (HCC)   Hypertension   Spastic hemiplegia (HCC)   Sepsis (HCC)   AKI (acute kidney injury) (HCC)   Hydroureteronephrosis-right   Ureterovesical junction (UVJ) obstruction   Acute metabolic encephalopathy   Acute respiratory failure with hypoxia (HCC)   Pressure injury of skin   Right kidney stone   Hypernatremia   Aspiration into airway   Palliative care encounter   Diabetes insipidus (HCC)  Subjective: Awake and alert Does not verbalize  Abtx:  Anti-infectives (From admission, onward)   Start     Dose/Rate Route Frequency Ordered Stop   03/31/18 1330  ciprofloxacin (CIPRO) IVPB 400 mg     400 mg 200 mL/hr over 60 Minutes Intravenous To Radiology 03/31/18 1236 03/31/18 1830   03/29/18 1430  cefTRIAXone (ROCEPHIN) 1 g in sodium chloride 0.9 % 100 mL IVPB     1 g 200 mL/hr over 30 Minutes Intravenous Every 24 hours 03/29/18 1430     03/19/18 1400  amoxicillin-clavulanate (AUGMENTIN) 875-125 MG per tablet 1 tablet     1 tablet Oral Every 12 hours 03/19/18 0911 03/23/18 2224   03/18/18 1700  piperacillin-tazobactam (ZOSYN) IVPB 3.375 g  Status:  Discontinued     3.375 g 12.5 mL/hr over 240 Minutes Intravenous Every 8 hours 03/18/18 1343 03/19/18 0910   03/17/18 1400  Ampicillin-Sulbactam (UNASYN) 3 g in sodium chloride 0.9 % 100 mL IVPB  Status:  Discontinued     3 g 200 mL/hr over 30 Minutes Intravenous Every 6 hours 03/17/18 1300 03/18/18 1337   03/14/18 1430  ceFEPIme (MAXIPIME) 2 g in sodium chloride 0.9 % 100 mL IVPB  Status:  Discontinued     2 g 200 mL/hr over 30 Minutes Intravenous Every 24 hours 03/14/18 1136 03/15/18 0956   03/14/18 0100  vancomycin (VANCOCIN) 500 mg in sodium chloride 0.9 % 100 mL IVPB  Status:  Discontinued     500 mg 100 mL/hr over 60 Minutes  Intravenous Every 12 hours 03/13/18 1510 03/15/18 0956   03/11/18 2000  vancomycin (VANCOCIN) IVPB 1000 mg/200 mL premix  Status:  Discontinued     1,000 mg 200 mL/hr over 60 Minutes Intravenous Every 24 hours 03/10/18 2053 03/11/18 1342   03/11/18 1400  vancomycin (VANCOCIN) IVPB 750 mg/150 ml premix  Status:  Discontinued     750 mg 150 mL/hr over 60 Minutes Intravenous Every 12 hours 03/11/18 1342 03/13/18 1510   03/11/18 0400  piperacillin-tazobactam (ZOSYN) IVPB 3.375 g  Status:  Discontinued     3.375 g 12.5 mL/hr over 240 Minutes Intravenous Every 8 hours 03/10/18 2053 03/14/18 1136   03/10/18 2030  piperacillin-tazobactam (ZOSYN) IVPB 3.375 g     3.375 g 100 mL/hr over 30 Minutes Intravenous  Once 03/10/18 2023 03/10/18 2139   03/10/18 2030  vancomycin (VANCOCIN) IVPB 1000 mg/200 mL premix     1,000 mg 200 mL/hr over 60 Minutes Intravenous  Once 03/10/18 2023 03/10/18 2208      Medications:  Scheduled: . sodium chloride   Intravenous Once  . baclofen  10 mg Oral TID  . clonazePAM  0.5 mg Oral Q24H  . clonazePAM  1 mg Oral BID  . desmopressin  20 mcg Nasal BID  . divalproex  500 mg Oral TID  . enoxaparin (LOVENOX) injection  40 mg  Subcutaneous Q24H  . feeding supplement (ENSURE ENLIVE)  237 mL Oral TID BM  . feeding supplement (PRO-STAT SUGAR FREE 64)  30 mL Oral BID BM  . ferrous sulfate  325 mg Oral Q breakfast  . free water  250 mL Oral TID WC  . insulin aspart  0-5 Units Subcutaneous QHS  . insulin aspart  0-9 Units Subcutaneous TID WC  . insulin detemir  10 Units Subcutaneous Daily  . mouth rinse  15 mL Mouth Rinse BID  . metoprolol tartrate  50 mg Oral QHS  . metoprolol tartrate  75 mg Oral Daily  . multivitamin with minerals  1 tablet Oral Daily  . polyethylene glycol  17 g Oral Daily  . QUEtiapine  125 mg Oral QHS  . QUEtiapine  150 mg Oral Daily  . selenium sulfide  1 application Topical Q T,Th,Sat-1800  . sertraline  100 mg Oral Daily  . sodium chloride  flush  5 mL Intracatheter Q8H    Objective: Vital signs in last 24 hours: Temp:  [98.1 F (36.7 C)-98.7 F (37.1 C)] 98.1 F (36.7 C) (08/18 1300) Pulse Rate:  [93-100] 98 (08/18 1054) Resp:  [22] 22 (08/18 0415) BP: (115-133)/(63-90) 121/70 (08/18 1134) SpO2:  [100 %] 100 % (08/18 1300) Weight:  [56.2 kg] 56.2 kg (08/18 0415)   General appearance: alert and no distress Resp: clear to auscultation bilaterally Cardio: regular rate and rhythm GI: normal findings: bowel sounds normal and soft, non-tender  Lab Results Recent Labs    04/03/18 0431 04/03/18 1614 04/04/18 0405  WBC 9.8  --  8.9  HGB 8.1* 8.5* 8.6*  HCT 28.1* 28.9* 29.9*  NA 150* 146* 147*  K 3.4* 3.7 3.4*  CL 117* 114* 113*  CO2 28 28 27   BUN 21* 19 18  CREATININE 0.71 0.74 0.71   Liver Panel Recent Labs    04/02/18 0432  PROT 9.5*  ALBUMIN 1.6*  AST 17  ALT 15  ALKPHOS 103  BILITOT 0.3   Sedimentation Rate No results for input(s): ESRSEDRATE in the last 72 hours. C-Reactive Protein No results for input(s): CRP in the last 72 hours.  Microbiology: Recent Results (from the past 240 hour(s))  Culture, blood (Routine X 2) w Reflex to ID Panel     Status: None   Collection Time: 03/28/18 10:40 AM  Result Value Ref Range Status   Specimen Description BLOOD LEFT HAND  Final   Special Requests   Final    BOTTLES DRAWN AEROBIC ONLY Blood Culture results may not be optimal due to an inadequate volume of blood received in culture bottles   Culture   Final    NO GROWTH 5 DAYS Performed at [facility], [address] [place], [place] [address]    Report Status 04/02/2018 FINAL  Final  Culture, blood (Routine X 2) w Reflex to ID Panel     Status: None   Collection Time: 03/28/18 10:48 AM  Result Value Ref Range Status   Specimen Description BLOOD LEFT HAND  Final   Special Requests   Final    BOTTLES DRAWN AEROBIC ONLY Blood Culture adequate volume   Culture   Final    NO GROWTH 5  DAYS Performed at [facility], [address] [place], [place] [address]    Report Status 04/02/2018 FINAL  Final  Culture, Urine     Status: Abnormal   Collection Time: 03/28/18 11:14 AM  Result Value Ref Range Status  Specimen Description URINE, CATHETERIZED  Final   Special Requests   Final    NONE Performed at [facility], [address] [place], [place] [address]    Culture >=100,000 COLONIES/mL PROTEUS MIRABILIS (A)  Final   Report Status 03/30/2018 FINAL  Final   Organism ID, Bacteria PROTEUS MIRABILIS (A)  Final      Susceptibility   Proteus mirabilis - MIC*    AMPICILLIN >=32 RESISTANT Resistant     CEFAZOLIN 8 SENSITIVE Sensitive     CEFTRIAXONE <=1 SENSITIVE Sensitive     CIPROFLOXACIN >=4 RESISTANT Resistant     GENTAMICIN <=1 SENSITIVE Sensitive     IMIPENEM 2 SENSITIVE Sensitive     NITROFURANTOIN 128 RESISTANT Resistant     TRIMETH/SULFA >=320 RESISTANT Resistant     AMPICILLIN/SULBACTAM 16 INTERMEDIATE Intermediate     PIP/TAZO <=4 SENSITIVE Sensitive     * >=100,000 COLONIES/mL PROTEUS MIRABILIS  Culture, blood (routine x 2)     Status: None (Preliminary result)   Collection Time: 04/02/18  7:55 AM  Result Value Ref Range Status   Specimen Description BLOOD RIGHT HAND  Final   Special Requests   Final    BOTTLES DRAWN AEROBIC AND ANAEROBIC Blood Culture adequate volume   Culture   Final    NO GROWTH 1 DAY Performed at [facility], [address] [place], [place] [address]    Report Status PENDING  Incomplete  Culture, blood (routine x 2)     Status: None (Preliminary result)   Collection Time: 04/02/18  8:07 AM  Result Value Ref Range Status   Specimen Description BLOOD LEFT HAND  Final   Special Requests   Final    BOTTLES DRAWN AEROBIC AND ANAEROBIC Blood Culture adequate volume   Culture  Setup Time   Final    GRAM POSITIVE COCCI IN CLUSTERS AEROBIC BOTTLE ONLY CRITICAL RESULT CALLED TO, READ BACK BY AND VERIFIED WITH:  Francis Dowse [id number] MLM Performed at [facility], [address] [place], [place] [address]    Culture GRAM POSITIVE COCCI  Final   Report Status PENDING  Incomplete  Blood Culture ID Panel (Reflexed)     Status: Abnormal   Collection Time: 04/02/18  8:07 AM  Result Value Ref Range Status   Enterococcus species NOT DETECTED NOT DETECTED Final   Listeria monocytogenes NOT DETECTED NOT DETECTED Final   Staphylococcus species DETECTED (A) NOT DETECTED Final    Comment: Methicillin (oxacillin) resistant coagulase negative staphylococcus. Possible blood culture contaminant (unless isolated from more than one blood culture draw or clinical case suggests pathogenicity). No antibiotic treatment is indicated for blood  culture contaminants. CRITICAL RESULT CALLED TO, READ BACK BY AND VERIFIED WITH: PHARMD F WILSON [id number] MLM    Staphylococcus aureus NOT DETECTED NOT DETECTED Final   Methicillin resistance DETECTED (A) NOT DETECTED Final    Comment: CRITICAL RESULT CALLED TO, READ BACK BY AND VERIFIED WITH: PHARMD F WILSON [id number] MLM    Streptococcus species NOT DETECTED NOT DETECTED Final   Streptococcus agalactiae NOT DETECTED NOT DETECTED Final   Streptococcus pneumoniae NOT DETECTED NOT DETECTED Final   Streptococcus pyogenes NOT DETECTED NOT DETECTED Final   Acinetobacter baumannii NOT DETECTED NOT DETECTED Final   Enterobacteriaceae species NOT DETECTED NOT DETECTED Final   Enterobacter cloacae complex NOT DETECTED NOT DETECTED Final   Escherichia coli NOT DETECTED NOT DETECTED Final   Klebsiella oxytoca NOT DETECTED NOT DETECTED Final  Klebsiella pneumoniae NOT DETECTED NOT DETECTED Final   Proteus species NOT DETECTED NOT DETECTED Final   Serratia marcescens NOT DETECTED NOT DETECTED Final   Haemophilus influenzae NOT DETECTED NOT DETECTED Final   Neisseria meningitidis NOT DETECTED NOT DETECTED Final   Pseudomonas aeruginosa NOT DETECTED NOT DETECTED Final    Candida albicans NOT DETECTED NOT DETECTED Final   Candida glabrata NOT DETECTED NOT DETECTED Final   Candida krusei NOT DETECTED NOT DETECTED Final   Candida parapsilosis NOT DETECTED NOT DETECTED Final   Candida tropicalis NOT DETECTED NOT DETECTED Final    Comment: Performed at [facility], [address] [place], [place] [address]  Culture, Urine     Status: None   Collection Time: 04/02/18  1:32 PM  Result Value Ref Range Status   Specimen Description URINE, RANDOM  Final   Special Requests NONE  Final   Culture   Final    NO GROWTH Performed at [facility], [address] [place], [place] [address]    Report Status 04/03/2018 FINAL  Final    Studies/Results: Ct Chest W Contrast  Result Date: 04/02/2018 CLINICAL DATA:  Persistent fever EXAM: CT CHEST WITH CONTRAST TECHNIQUE: Multidetector CT imaging of the chest was performed during intravenous contrast administration. CONTRAST:  105mL OMNIPAQUE IOHEXOL 300 MG/ML  SOLN COMPARISON:  Radiograph 03/30/2018, CT chest 03/15/2018, CT abdomen 03/14/2018 FINDINGS: Cardiovascular: Nonaneurysmal aorta. Common origin of the right brachiocephalic and left common carotid artery. Heart size upper normal. No pericardial effusion Mediastinum/Nodes: Midline trachea. Generous thyroid gland with subcentimeter hypodense nodules. Subcentimeter mediastinal lymph nodes. Esophagus within normal limits. Lungs/Pleura: Improved aeration since comparison CT 03/15/2018. Partial but incomplete clearing of right upper lobe pulmonary infiltrate. Minimal ground-glass density in the left upper lobe. No significant pleural effusion. Upper Abdomen: Fatty enlargement of the left adrenal gland as before. No acute abnormality. Musculoskeletal: No chest wall abnormality. No acute or significant osseous findings. Degenerative changes. IMPRESSION: 1. Improved aeration bilaterally since CT 03/15/2018. Partial but incomplete clearing primarily of right upper lobe  pulmonary infiltrate with few scattered foci of ground-glass density elsewhere in the lungs. Electronically Signed   By: Jasmine PangKim  Fujinaga M.D.   On: 04/02/2018 21:22   Dg Ankle Left Port  Result Date: 04/02/2018 CLINICAL DATA:  52 year old male with a history of left ankle ulceration EXAM: PORTABLE LEFT ANKLE - 2 VIEW COMPARISON:  None. FINDINGS: Osteopenia. No evidence of acute displaced fracture, however, portions of the bone are excluded. No erosive changes. Degenerative changes of the tibiotalar joint, subtalar joint, and the hindfoot. Beaking at the talus. Degenerative changes of the midfoot. IMPRESSION: Osteopenia, with no definite acute bony abnormality. Evidence of tarsal coalition Electronically Signed   By: Gilmer MorJaime  Wagner D.O.   On: 04/02/2018 13:58     Assessment/Plan: UTI- proteus Perc Nephrostomy 8-14, double J stent BCx 1/2 MRSE  Total days of antibiotics: 6 ceftriaxone  Afebrile, WBC normal He has only 1 positive BCx, would not treat the MRSE Aim for 10-14 days of ceftriaxone (his proteus was resistant to cipro, bactrim, amp). Could send out on keflex if wanting po instead of IV or IM.  Available as needed.           Johny SaxJeffrey Jazmen Lindenbaum MD, FACP Infectious Diseases (pager) [phone] [email or web address] 04/04/2018, 1:24 PM  LOS: 24 days

## 2018-04-04 NOTE — Progress Notes (Signed)
PROGRESS NOTE    Cody [name]  WGN:[id number] DOB: [birth year] DOA: 03/10/2018 PCP: [name], MD   Brief Narrative:  52 year old male TBI, spastic left hemiplegia DM 2, hypothyroid, bipolar. Admitted 03/11/2018 increasing lethargy X 48 hours. Admitted with sepsis protocol started-found to have AKI creatinine 2.1 BUN 40 T-max 102.6. WBC 15 CT abdomen pelvis right obstructive uropathy 7X5 UVJ stone. CT head = extensive encephalomalacia slight interval enlargement of the ventricular system compared to 2009 CT. Presumed sepsis from acute pyelonephritis started on Vanco Zosyn given 4 L of fluid and kept on stepdown unit. Urologist was consulted underwent cystoscopy and placement of 6 French X 24 cm JJ stent 7/25. Hospitalization complicated by persisting fever and sepsis.  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  Nephrology was consulted.  Assessment & Plan:    Principal Problem:   Acute pyelonephritis Active Problems:   Depression   Diabetes mellitus without complication (HCC)   Hypertension   Spastic hemiplegia (HCC)   Sepsis (HCC)   AKI (acute kidney injury) (HCC)   Hydroureteronephrosis-right   Ureterovesical junction (UVJ) obstruction   Acute metabolic encephalopathy   Acute respiratory failure with hypoxia (HCC)   Pressure injury of skin   Right kidney stone   Hypernatremia   Aspiration into airway   Palliative care encounter   Diabetes insipidus (HCC)   Sepsis likely multifactorial  Persistent fever Pt with R obsteructive uropathy with 7 x 5 mm R ureterovesical junction stone causing hydroureteronephrosis and perinephric stranding (with multiple nonobstructing calculi measuring up to 10 mm) S/p cystoscopy with R retrograde ureteropyelogram, fluoscopic interpretation, placement of 6 french by 24 cm contour double J stent without tether on 7/25 Pt with recurrent fevers and infectious disease was consulted due to fever without [name] clear source.   He was  started on unasyn for possible aspiration pneumonia and then transitioned to augmentin.  He has completed course of treatment.  Patient continued to have fevers. Some concern for drug fever? Recurrent fevers since perc neph tube placement.  Last fever 8/16 around noon.   Repeat bcx pending (1/2 with coag neg staph, likely contaminant, follow) Urine cx pending (urine cx from 8/11 with proteus) CT chest with improved aeration and partial clearing of RUL pulm infiltrate  L buttock with decubitus ulcer (reviewed imaging with rads and no osteo) LLE with no evidence of acute bony abnormality on imaging Appreciate ID assistance, follow UE [place] for DVT (pending).  Planning for 10-14 days of abx. Will continue ceftriaxone CT abdomen pelvis with R ureteral stent with mild residual hydronephrosis, delayed R renal nephrogram and swelling of R kidney with respect to L may indicate abnormal renal function 2/2 obstruction or infection - urology recommended perc nephrostomy tube placement.  Now s/p perc nephrostomy tube placement on 7/25. Urology planning for removal of double J stent next week.  Tachycardia: improved, follow  Severe hypernatremia thought to be secondary to central diabetes insipidus/acute kidney injury Fluctuating, follow Patient was seen by nephrology.   He was placed on desmopressin 20 mcg nasal BID and dextrose infusion.   Sodium level has improved  He has been tolerating his diet and water.   Discussed with nursing, encourage PO free water intake, at least 250 cc PO three times in between meals (and at least 250 cc 3 times day with meals) He will need continued encouragement to consume water even at skilled nursing facility.  We will continue with current dose of desmopressin intranasally.   Will need periodic  monitoring of his sodium levels.   If he develops hypernatremia despite these measures then further discussions will need to be held with family as the only way to provide him free  water than would be to place Cori Justus PEG tube (discussed with sister this possibility today)  Cholelithiasis: seen on imaging, doesn't seem to have any abdominal pain.  Normal LFT's.  But large stone in gallbladder neck.  No evidence of cholecystitis.  Follow.   History of traumatic brain injury with left hemiplegia Patient is stable.  Patient has Reggie Welge shunt.  This was discussed with neurosurgery who did not feel like his symptoms were secondary to shunt malfunction.  Neurologically he appears to have improved.  He is probably close to his baseline.  Per neurosurgery, consider shunt evaluation after abx.   Chronic aspiration/oropharyngeal dysphagia He is on dysphagia 1 diet.  Speech therapy has seen.  Remains at high risk for recurrent aspirations.  Palliative medicine has been following.   History of bipolar disorder He is on medications which are being continued.  Stage 3 pressure injury  Noted in L buttock and heel (previous note mentioned R posterior thigh, but I did not note any here on my exam today).  Wound care.  No active infection appreciated.  Continue frequent turns.  F/u x ray L ankle (as above).  Diabetes mellitus type 2 Continue long-acting insulin and SSI.  BG's stable.  Acute metabolic encephalopathy Most likely secondary to acute infection.  Appears close to baseline.  Stable..  Acute respiratory failure with hypoxia Secondary to aspiration.  Resolved.  No PE noted on CT angiogram done on 7/29.  Normocytic anemia Received transfusion for hb of 6.7 8/14 -> improved to 8.8.   Start iron No evidence of bleeding, follow.  Relatively stable, trend.  Essential hypertension Blood pressure is well controlled.  Patient is on metoprolol.  Obstructive uropathy Status post stent placement -> now s/p perc neph tube placed on 8/14   Imaging studies did suggest [name] retained guidewire as well as proximal aspect of stent may be in renal parenchyma.   Urology is aware of this.     Follow-up with urology after discharge.  Foley removed.  Follow post voids.  Nonsustained Vtach: follow, no echo to eval EF.  Will continue to monitor.    Hypokalemia: replete, follow  DVT prophylaxis: lovenox (held on 8/14 with stent placement, resumed 8/16) Code Status: full  Family Communication: discussed with sister 8/14, will call 8/18 Disposition Plan: pending improvement   Consultants:   Urology  Neurosurgery  ID  Nephrology  Palliative  Procedures:  On 7/25: Cystoscopy, right retrograde ureteropyelogram, fluoroscopic interpretation, placement of 6 French by 24 cm contour double-J stent without tether  Lower extremity Doppler study  Negative for DVT  8/14 R perc nephrostomy tube   Antimicrobials: Anti-infectives (From admission, onward)   Start     Dose/Rate Route Frequency Ordered Stop   03/31/18 1330  ciprofloxacin (CIPRO) IVPB 400 mg     400 mg 200 mL/hr over 60 Minutes Intravenous To Radiology 03/31/18 1236 03/31/18 1830   03/29/18 1430  cefTRIAXone (ROCEPHIN) 1 g in sodium chloride 0.9 % 100 mL IVPB     1 g 200 mL/hr over 30 Minutes Intravenous Every 24 hours 03/29/18 1430     03/19/18 1400  amoxicillin-clavulanate (AUGMENTIN) 875-125 MG per tablet 1 tablet     1 tablet Oral Every 12 hours 03/19/18 0911 03/23/18 2224   03/18/18 1700  piperacillin-tazobactam (ZOSYN) IVPB 3.375  g  Status:  Discontinued     3.375 g 12.5 mL/hr over 240 Minutes Intravenous Every 8 hours 03/18/18 1343 03/19/18 0910   03/17/18 1400  Ampicillin-Sulbactam (UNASYN) 3 g in sodium chloride 0.9 % 100 mL IVPB  Status:  Discontinued     3 g 200 mL/hr over 30 Minutes Intravenous Every 6 hours 03/17/18 1300 03/18/18 1337   03/14/18 1430  ceFEPIme (MAXIPIME) 2 g in sodium chloride 0.9 % 100 mL IVPB  Status:  Discontinued     2 g 200 mL/hr over 30 Minutes Intravenous Every 24 hours 03/14/18 1136 03/15/18 0956   03/14/18 0100  vancomycin (VANCOCIN) 500 mg in sodium chloride 0.9 % 100  mL IVPB  Status:  Discontinued     500 mg 100 mL/hr over 60 Minutes Intravenous Every 12 hours 03/13/18 1510 03/15/18 0956   03/11/18 2000  vancomycin (VANCOCIN) IVPB 1000 mg/200 mL premix  Status:  Discontinued     1,000 mg 200 mL/hr over 60 Minutes Intravenous Every 24 hours 03/10/18 2053 03/11/18 1342   03/11/18 1400  vancomycin (VANCOCIN) IVPB 750 mg/150 ml premix  Status:  Discontinued     750 mg 150 mL/hr over 60 Minutes Intravenous Every 12 hours 03/11/18 1342 03/13/18 1510   03/11/18 0400  piperacillin-tazobactam (ZOSYN) IVPB 3.375 g  Status:  Discontinued     3.375 g 12.5 mL/hr over 240 Minutes Intravenous Every 8 hours 03/10/18 2053 03/14/18 1136   03/10/18 2030  piperacillin-tazobactam (ZOSYN) IVPB 3.375 g     3.375 g 100 mL/hr over 30 Minutes Intravenous  Once 03/10/18 2023 03/10/18 2139   03/10/18 2030  vancomycin (VANCOCIN) IVPB 1000 mg/200 mL premix     1,000 mg 200 mL/hr over 60 Minutes Intravenous  Once 03/10/18 2023 03/10/18 2208     Subjective: Quiet, grinding teeth.  Objective: Vitals:   04/04/18 0900 04/04/18 1054 04/04/18 1134 04/04/18 1300  BP:  133/63 121/70   Pulse:  98    Resp:      Temp: 98.1 F (36.7 C)   98.1 F (36.7 C)  TempSrc: Axillary   Axillary  SpO2: 100%   100%  Weight:      Height:        Intake/Output Summary (Last 24 hours) at 04/04/2018 1431 Last data filed at 04/04/2018 1241 Gross per 24 hour  Intake 600 ml  Output 1225 ml  Net -625 ml   Filed Weights   04/02/18 0500 04/03/18 0500 04/04/18 0415  Weight: 55.3 kg 58.5 kg 56.2 kg    Examination:  General: No acute distress. Cardiovascular: Heart sounds show Nastacia Raybuck regular rate, and rhythm.  Lungs: Clear to auscultation bilaterally with good air movement.  Abdomen: Soft, nontender, nondistended  Neurological: Alert and disoriented. Cranial nerves II through XII grossly intact. Skin: wounds not examined today Extremities: contractures   Data Reviewed: I have personally  reviewed following labs and imaging studies  CBC: Recent Labs  Lab 03/31/18 1616 04/01/18 0432 04/02/18 0432 04/03/18 0431 04/03/18 1614 04/04/18 0405  WBC 12.1* 13.1* 12.8* 9.8  --  8.9  NEUTROABS 6.9  --   --   --   --   --   HGB 7.7* 8.8* 8.8* 8.1* 8.5* 8.6*  HCT 25.8* 29.4* 30.1* 28.1* 28.9* 29.9*  MCV 88.1 87.8 89.3 90.1  --  90.6  PLT 257 268 225 157  --  141*   Basic Metabolic Panel: Recent Labs  Lab 03/31/18 0344 04/01/18 0432 04/02/18 0432 04/03/18 0431 04/03/18  1614 04/04/18 0405  NA 144 146* 148* 150* 146* 147*  K 3.4* 4.2 4.0 3.4* 3.7 3.4*  CL 109 114* 113* 117* 114* 113*  CO2 30 26 26 28 28 27   GLUCOSE 121* 121* 98 93 116* 132*  BUN 16 14 28* 21* 19 18  CREATININE 0.73 0.74 0.83 0.71 0.74 0.71  CALCIUM 7.7* 7.6* 7.6* 7.6* 7.7* 7.9*  MG 2.0 1.8 2.1 2.0  --  2.0   GFR: Estimated Creatinine Clearance: 86.8 mL/min (by C-G formula based on SCr of 0.71 mg/dL). Liver Function Tests: Recent Labs  Lab 03/31/18 0344 04/02/18 0432  AST 20 17  ALT 15 15  ALKPHOS 85 103  BILITOT 0.4 0.3  PROT 8.5* 9.5*  ALBUMIN 1.4* 1.6*   No results for input(s): LIPASE, AMYLASE in the last 168 hours. No results for input(s): AMMONIA in the last 168 hours. Coagulation Profile: Recent Labs  Lab 03/31/18 1616  INR 1.29   Cardiac Enzymes: No results for input(s): CKTOTAL, CKMB, CKMBINDEX, TROPONINI in the last 168 hours. BNP (last 3 results) No results for input(s): PROBNP in the last 8760 hours. HbA1C: No results for input(s): HGBA1C in the last 72 hours. CBG: Recent Labs  Lab 04/03/18 1201 04/03/18 1631 04/03/18 2113 04/04/18 0755 04/04/18 1136  GLUCAP 107* 104* 145* 82 113*   Lipid Profile: No results for input(s): CHOL, HDL, LDLCALC, TRIG, CHOLHDL, LDLDIRECT in the last 72 hours. Thyroid Function Tests: No results for input(s): TSH, T4TOTAL, FREET4, T3FREE, THYROIDAB in the last 72 hours. Anemia Panel: No results for input(s): VITAMINB12, FOLATE,  FERRITIN, TIBC, IRON, RETICCTPCT in the last 72 hours. Sepsis Labs: Recent Labs  Lab 03/30/18 0311 03/31/18 0344 04/02/18 0432 04/03/18 0431  PROCALCITON 0.34 0.23 0.30 0.22    Recent Results (from the past 240 hour(s))  Culture, blood (Routine X 2) w Reflex to ID Panel     Status: None   Collection Time: 03/28/18 10:40 AM  Result Value Ref Range Status   Specimen Description BLOOD LEFT HAND  Final   Special Requests   Final    BOTTLES DRAWN AEROBIC ONLY Blood Culture results may not be optimal due to an inadequate volume of blood received in culture bottles   Culture   Final    NO GROWTH 5 DAYS Performed at [facility], [address] [place], [place] [address]    Report Status 04/02/2018 FINAL  Final  Culture, blood (Routine X 2) w Reflex to ID Panel     Status: None   Collection Time: 03/28/18 10:48 AM  Result Value Ref Range Status   Specimen Description BLOOD LEFT HAND  Final   Special Requests   Final    BOTTLES DRAWN AEROBIC ONLY Blood Culture adequate volume   Culture   Final    NO GROWTH 5 DAYS Performed at [facility], [address] [place], [place] [address]    Report Status 04/02/2018 FINAL  Final  Culture, Urine     Status: Abnormal   Collection Time: 03/28/18 11:14 AM  Result Value Ref Range Status   Specimen Description URINE, CATHETERIZED  Final   Special Requests   Final    NONE Performed at [facility], [address] [place], [place] [address]    Culture >=100,000 COLONIES/mL PROTEUS MIRABILIS (Allena Pietila)  Final   Report Status 03/30/2018 FINAL  Final   Organism ID, Bacteria PROTEUS MIRABILIS (Olman Yono)  Final      Susceptibility  Proteus mirabilis - MIC*    AMPICILLIN >=32 RESISTANT Resistant     CEFAZOLIN 8 SENSITIVE Sensitive     CEFTRIAXONE <=1 SENSITIVE Sensitive     CIPROFLOXACIN >=4 RESISTANT Resistant     GENTAMICIN <=1 SENSITIVE Sensitive     IMIPENEM 2 SENSITIVE Sensitive     NITROFURANTOIN 128 RESISTANT Resistant      TRIMETH/SULFA >=320 RESISTANT Resistant     AMPICILLIN/SULBACTAM 16 INTERMEDIATE Intermediate     PIP/TAZO <=4 SENSITIVE Sensitive     * >=100,000 COLONIES/mL PROTEUS MIRABILIS  Culture, blood (routine x 2)     Status: None (Preliminary result)   Collection Time: 04/02/18  7:55 AM  Result Value Ref Range Status   Specimen Description BLOOD RIGHT HAND  Final   Special Requests   Final    BOTTLES DRAWN AEROBIC AND ANAEROBIC Blood Culture adequate volume   Culture   Final    NO GROWTH 1 DAY Performed at [facility], [address] [place], [place] [address]    Report Status PENDING  Incomplete  Culture, blood (routine x 2)     Status: None (Preliminary result)   Collection Time: 04/02/18  8:07 AM  Result Value Ref Range Status   Specimen Description BLOOD LEFT HAND  Final   Special Requests   Final    BOTTLES DRAWN AEROBIC AND ANAEROBIC Blood Culture adequate volume   Culture  Setup Time   Final    GRAM POSITIVE COCCI IN CLUSTERS AEROBIC BOTTLE ONLY CRITICAL RESULT CALLED TO, READ BACK BY AND VERIFIED WITH: Francis Dowse [id number] MLM Performed at [facility], [address], [place], [place] [address]    Culture GRAM POSITIVE COCCI  Final   Report Status PENDING  Incomplete  Blood Culture ID Panel (Reflexed)     Status: Abnormal   Collection Time: 04/02/18  8:07 AM  Result Value Ref Range Status   Enterococcus species NOT DETECTED NOT DETECTED Final   Listeria monocytogenes NOT DETECTED NOT DETECTED Final   Staphylococcus species DETECTED (Quindon Denker) NOT DETECTED Final    Comment: Methicillin (oxacillin) resistant coagulase negative staphylococcus. Possible blood culture contaminant (unless isolated from more than one blood culture draw or clinical case suggests pathogenicity). No antibiotic treatment is indicated for blood  culture contaminants. CRITICAL RESULT CALLED TO, READ BACK BY AND VERIFIED WITH: PHARMD F WILSON [id number] MLM    Staphylococcus aureus NOT  DETECTED NOT DETECTED Final   Methicillin resistance DETECTED (Tracee Mccreery) NOT DETECTED Final    Comment: CRITICAL RESULT CALLED TO, READ BACK BY AND VERIFIED WITH: PHARMD F WILSON [id number] MLM    Streptococcus species NOT DETECTED NOT DETECTED Final   Streptococcus agalactiae NOT DETECTED NOT DETECTED Final   Streptococcus pneumoniae NOT DETECTED NOT DETECTED Final   Streptococcus pyogenes NOT DETECTED NOT DETECTED Final   Acinetobacter baumannii NOT DETECTED NOT DETECTED Final   Enterobacteriaceae species NOT DETECTED NOT DETECTED Final   Enterobacter cloacae complex NOT DETECTED NOT DETECTED Final   Escherichia coli NOT DETECTED NOT DETECTED Final   Klebsiella oxytoca NOT DETECTED NOT DETECTED Final   Klebsiella pneumoniae NOT DETECTED NOT DETECTED Final   Proteus species NOT DETECTED NOT DETECTED Final   Serratia marcescens NOT DETECTED NOT DETECTED Final   Haemophilus influenzae NOT DETECTED NOT DETECTED Final   Neisseria meningitidis NOT DETECTED NOT DETECTED Final   Pseudomonas aeruginosa NOT DETECTED NOT DETECTED Final   Candida albicans NOT DETECTED NOT DETECTED Final   Candida  glabrata NOT DETECTED NOT DETECTED Final   Candida krusei NOT DETECTED NOT DETECTED Final   Candida parapsilosis NOT DETECTED NOT DETECTED Final   Candida tropicalis NOT DETECTED NOT DETECTED Final    Comment: Performed at [facility], [address] [place], [place] [address]  Culture, Urine     Status: None   Collection Time: 04/02/18  1:32 PM  Result Value Ref Range Status   Specimen Description URINE, RANDOM  Final   Special Requests NONE  Final   Culture   Final    NO GROWTH Performed at [facility], [address] [place], [place] [address]    Report Status 04/03/2018 FINAL  Final         Radiology Studies: Ct Chest W Contrast  Result Date: 04/02/2018 CLINICAL DATA:  Persistent fever EXAM: CT CHEST WITH CONTRAST TECHNIQUE: Multidetector CT imaging of the chest was performed  during intravenous contrast administration. CONTRAST:  105mL OMNIPAQUE IOHEXOL 300 MG/ML  SOLN COMPARISON:  Radiograph 03/30/2018, CT chest 03/15/2018, CT abdomen 03/14/2018 FINDINGS: Cardiovascular: Nonaneurysmal aorta. Common origin of the right brachiocephalic and left common carotid artery. Heart size upper normal. No pericardial effusion Mediastinum/Nodes: Midline trachea. Generous thyroid gland with subcentimeter hypodense nodules. Subcentimeter mediastinal lymph nodes. Esophagus within normal limits. Lungs/Pleura: Improved aeration since comparison CT 03/15/2018. Partial but incomplete clearing of right upper lobe pulmonary infiltrate. Minimal ground-glass density in the left upper lobe. No significant pleural effusion. Upper Abdomen: Fatty enlargement of the left adrenal gland as before. No acute abnormality. Musculoskeletal: No chest wall abnormality. No acute or significant osseous findings. Degenerative changes. IMPRESSION: 1. Improved aeration bilaterally since CT 03/15/2018. Partial but incomplete clearing primarily of right upper lobe pulmonary infiltrate with few scattered foci of ground-glass density elsewhere in the lungs. Electronically Signed   By: Jasmine PangKim  Fujinaga M.D.   On: 04/02/2018 21:22        Scheduled Meds: . sodium chloride   Intravenous Once  . baclofen  10 mg Oral TID  . clonazePAM  0.5 mg Oral Q24H  . clonazePAM  1 mg Oral BID  . desmopressin  20 mcg Nasal BID  . divalproex  500 mg Oral TID  . enoxaparin (LOVENOX) injection  40 mg Subcutaneous Q24H  . feeding supplement (ENSURE ENLIVE)  237 mL Oral TID BM  . feeding supplement (PRO-STAT SUGAR FREE 64)  30 mL Oral BID BM  . ferrous sulfate  325 mg Oral Q breakfast  . free water  250 mL Oral TID WC  . insulin aspart  0-5 Units Subcutaneous QHS  . insulin aspart  0-9 Units Subcutaneous TID WC  . insulin detemir  10 Units Subcutaneous Daily  . mouth rinse  15 mL Mouth Rinse BID  . metoprolol tartrate  50 mg Oral QHS  .  metoprolol tartrate  75 mg Oral Daily  . multivitamin with minerals  1 tablet Oral Daily  . polyethylene glycol  17 g Oral Daily  . QUEtiapine  125 mg Oral QHS  . QUEtiapine  150 mg Oral Daily  . selenium sulfide  1 application Topical Q T,Th,Sat-1800  . sertraline  100 mg Oral Daily  . sodium chloride flush  5 mL Intracatheter Q8H   Continuous Infusions: . sodium chloride Stopped (04/02/18 1451)  . cefTRIAXone (ROCEPHIN)  IV 1 g (04/03/18 1539)     LOS: 24 days    Time spent: over 30 min MDM mod with multiple medical problems.    Lacretia Nicksaldwell Powell,  MD [facility] Pager [phone]  If 7PM-7AM, please contact night-coverage [email or web address] Password [facility] 04/04/2018, 2:31 PM

## 2018-04-05 LAB — CBC
HCT: 30.4 % — ABNORMAL LOW (ref 39.0–52.0)
Hemoglobin: 8.7 g/dL — ABNORMAL LOW (ref 13.0–17.0)
MCH: 25.7 pg — AB (ref 26.0–34.0)
MCHC: 28.6 g/dL — ABNORMAL LOW (ref 30.0–36.0)
MCV: 89.7 fL (ref 78.0–100.0)
PLATELETS: 130 10*3/uL — AB (ref 150–400)
RBC: 3.39 MIL/uL — AB (ref 4.22–5.81)
RDW: 17.9 % — ABNORMAL HIGH (ref 11.5–15.5)
WBC: 10 10*3/uL (ref 4.0–10.5)

## 2018-04-05 LAB — BASIC METABOLIC PANEL
Anion gap: 3 — ABNORMAL LOW (ref 5–15)
BUN: 18 mg/dL (ref 6–20)
CO2: 29 mmol/L (ref 22–32)
Calcium: 7.7 mg/dL — ABNORMAL LOW (ref 8.9–10.3)
Chloride: 115 mmol/L — ABNORMAL HIGH (ref 98–111)
Creatinine, Ser: 0.68 mg/dL (ref 0.61–1.24)
GFR calc Af Amer: >60 mL/min (ref >60)
Glucose, Bld: 114 mg/dL — ABNORMAL HIGH (ref 70–99)
POTASSIUM: 3.6 mmol/L (ref 3.5–5.1)
Sodium: 147 mmol/L — ABNORMAL HIGH (ref 135–145)

## 2018-04-05 LAB — MAGNESIUM: Magnesium: 2 mg/dL (ref 1.7–2.4)

## 2018-04-05 LAB — GLUCOSE, CAPILLARY
GLUCOSE-CAPILLARY: 143 mg/dL — AB (ref 70–99)
Glucose-Capillary: 107 mg/dL — ABNORMAL HIGH (ref 70–99)
Glucose-Capillary: 106 mg/dL — ABNORMAL HIGH (ref 70–99)
Glucose-Capillary: 124 mg/dL — ABNORMAL HIGH (ref 70–99)

## 2018-04-05 LAB — CULTURE, BLOOD (ROUTINE X 2): SPECIAL REQUESTS: ADEQUATE
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
## 2018-04-05 NOTE — Progress Notes (Signed)
CSW following for disposition planning. Per MD, patient likely medically ready tomorrow. CSW updated patient's facility and they are ready for patient to return tomorrow. Also updated patient's sister, Junious DresserConnie. CSW to follow and support.  Abigail ButtsSusan Elayne Gruver, LCSWA [phone]

## 2018-04-05 NOTE — Progress Notes (Signed)
I have spoken w/ pt's sister Junious DresserConnie. Now that perc tube is in place I will perform bedside cysto/stent extraction tonight. Pt does not need to be NPO.

## 2018-04-05 NOTE — Plan of Care (Signed)
  Problem: Clinical Measurements: Goal: Respiratory complications will improve Outcome: Completed/Met

## 2018-04-06 ENCOUNTER — Inpatient Hospital Stay (HOSPITAL_COMMUNITY): Payer: Medicare Other

## 2018-04-06 ENCOUNTER — Encounter (HOSPITAL_COMMUNITY): Payer: Self-pay | Admitting: Interventional Radiology

## 2018-04-06 HISTORY — PX: IR NEPHROSTOGRAM RIGHT THRU EXISTING ACCESS: IMG6062

## 2018-04-06 LAB — CBC
HEMATOCRIT: 28.6 % — AB (ref 39.0–52.0)
Hemoglobin: 8.5 g/dL — ABNORMAL LOW (ref 13.0–17.0)
MCH: 26.3 pg (ref 26.0–34.0)
MCHC: 29.7 g/dL — ABNORMAL LOW (ref 30.0–36.0)
MCV: 88.5 fL (ref 78.0–100.0)
Platelets: 122 10*3/uL — ABNORMAL LOW (ref 150–400)
RBC: 3.23 MIL/uL — AB (ref 4.22–5.81)
RDW: 18 % — ABNORMAL HIGH (ref 11.5–15.5)
WBC: 9.8 10*3/uL (ref 4.0–10.5)

## 2018-04-06 LAB — DIFFERENTIAL
ABS IMMATURE GRANULOCYTES: 0 10*3/uL (ref 0.0–0.1)
BASOS ABS: 0 10*3/uL (ref 0.0–0.1)
Basophils Relative: 0 %
EOS PCT: 2 %
Eosinophils Absolute: 0.2 10*3/uL (ref 0.0–0.7)
IMMATURE GRANULOCYTES: 0 %
LYMPHS PCT: 48 %
Lymphs Abs: 4.6 10*3/uL — ABNORMAL HIGH (ref 0.7–4.0)
Monocytes Absolute: 0.9 10*3/uL (ref 0.1–1.0)
Monocytes Relative: 9 %
NEUTROS ABS: 4 10*3/uL (ref 1.7–7.7)
Neutrophils Relative %: 41 %

## 2018-04-06 LAB — BASIC METABOLIC PANEL
ANION GAP: 5 (ref 5–15)
BUN: 20 mg/dL (ref 6–20)
CHLORIDE: 111 mmol/L (ref 98–111)
CO2: 26 mmol/L (ref 22–32)
Calcium: 7.4 mg/dL — ABNORMAL LOW (ref 8.9–10.3)
Creatinine, Ser: 0.75 mg/dL (ref 0.61–1.24)
GFR calc Af Amer: >60 mL/min (ref >60)
GFR calc non Af Amer: >60 mL/min (ref >60)
Glucose, Bld: 142 mg/dL — ABNORMAL HIGH (ref 70–99)
POTASSIUM: 3.3 mmol/L — AB (ref 3.5–5.1)
Sodium: 142 mmol/L (ref 135–145)

## 2018-04-06 LAB — GLUCOSE, CAPILLARY
GLUCOSE-CAPILLARY: 141 mg/dL — AB (ref 70–99)
GLUCOSE-CAPILLARY: 109 mg/dL — AB (ref 70–99)
Glucose-Capillary: 110 mg/dL — ABNORMAL HIGH (ref 70–99)

## 2018-04-06 LAB — HEPATIC FUNCTION PANEL
ALBUMIN: 1.5 g/dL — AB (ref 3.5–5.0)
ALT: 16 U/L (ref 0–44)
AST: 19 U/L (ref 15–41)
Alkaline Phosphatase: 84 U/L (ref 38–126)
Bilirubin, Direct: 0.1 mg/dL (ref 0.0–0.2)
Indirect Bilirubin: 0.2 mg/dL — ABNORMAL LOW (ref 0.3–0.9)
TOTAL PROTEIN: 9.3 g/dL — AB (ref 6.5–8.1)
Total Bilirubin: 0.3 mg/dL (ref 0.3–1.2)

## 2018-04-06 LAB — MAGNESIUM: Magnesium: 2 mg/dL (ref 1.7–2.4)

## 2018-04-06 LAB — SAVE SMEAR

## 2018-04-06 MED ORDER — POTASSIUM CHLORIDE 20 MEQ/15ML (10%) PO SOLN
40.0000 meq | Freq: Once | ORAL | Status: AC
  Administered 2018-04-06: 40 meq via ORAL
  Filled 2018-04-06: qty 30

## 2018-04-06 MED ORDER — LIDOCAINE HCL URETHRAL/MUCOSAL 2 % EX GEL
CUTANEOUS | Status: AC
  Filled 2018-04-06: qty 20

## 2018-04-06 MED ORDER — POTASSIUM CHLORIDE CRYS ER 20 MEQ PO TBCR
40.0000 meq | EXTENDED_RELEASE_TABLET | Freq: Once | ORAL | Status: DC
  Filled 2018-04-06: qty 2

## 2018-04-06 MED ORDER — IOPAMIDOL (ISOVUE-300) INJECTION 61%
INTRAVENOUS | Status: AC
  Filled 2018-04-06: qty 50

## 2018-04-06 NOTE — Progress Notes (Signed)
26 Days Post-Op Subjective: I am here to remove pt's right J2 stent. Sister @ bedside.  Objective: Vital signs in last 24 hours: Temp:  [98.4 F (36.9 C)-100 F (37.8 C)] 98.4 F (36.9 C) (08/20 0400) Pulse Rate:  [99-121] 99 (08/20 0400) Resp:  [18-30] 24 (08/20 0400) BP: (134-154)/(75-98) 134/75 (08/20 0400) SpO2:  [96 %-100 %] 100 % (08/20 0400) Weight:  [61.7 kg] 61.7 kg (08/20 0400)  Intake/Output from previous day: 08/19 0701 - 08/20 0700 In: 1179 [P.O.:1179] Out: 675 [Urine:675] Intake/Output this shift: Total I/O In: 360 [P.O.:360] Out: 450 [Urine:450]  Physical Exam:  Constitutional: Vital signs reviewed.    Eyes: PERRL, No scleral icterus.   Cardiovascular: RRR Pulmonary/Chest: Normal effort   Lab Results: Recent Labs    04/04/18 0405 04/05/18 0551 04/06/18 0505  HGB 8.6* 8.7* 8.5*  HCT 29.9* 30.4* 28.6*   BMET Recent Labs    04/04/18 0405 04/05/18 0551  NA 147* 147*  K 3.4* 3.6  CL 113* 115*  CO2 27 29  GLUCOSE 132* 114*  BUN 18 18  CREATININE 0.71 0.68  CALCIUM 7.9* 7.7*   No results for input(s): LABPT, INR in the last 72 hours. No results for input(s): LABURIN in the last 72 hours. Results for orders placed or performed during the hospital encounter of 03/10/18  Culture, blood (Routine x 2)     Status: None   Collection Time: 03/10/18  7:46 PM  Result Value Ref Range Status   Specimen Description BLOOD RIGHT ANTECUBITAL  Final   Special Requests   Final    BOTTLES DRAWN AEROBIC AND ANAEROBIC Blood Culture adequate volume   Culture   Final    NO GROWTH 5 DAYS Performed at [facility], [address] [place], [place] [address]    Report Status 03/15/2018 FINAL  Final  Culture, blood (Routine x 2)     Status: None   Collection Time: 03/10/18  8:35 PM  Result Value Ref Range Status   Specimen Description BLOOD RIGHT HAND  Final   Special Requests   Final    BOTTLES DRAWN AEROBIC ONLY Blood Culture results may not be optimal  due to an inadequate volume of blood received in culture bottles   Culture   Final    NO GROWTH 5 DAYS Performed at [facility], [address] [place], [place] [address]    Report Status 03/15/2018 FINAL  Final  Urine culture     Status: None   Collection Time: 03/10/18  9:10 PM  Result Value Ref Range Status   Specimen Description URINE, RANDOM  Final   Special Requests NONE  Final   Culture   Final    NO GROWTH Performed at [facility], [address] [place], [place] [address]    Report Status 03/12/2018 FINAL  Final  MRSA PCR Screening     Status: None   Collection Time: 03/11/18  5:15 AM  Result Value Ref Range Status   MRSA by PCR NEGATIVE NEGATIVE Final    Comment:        The GeneXpert MRSA Assay (FDA approved for NASAL specimens only), is one component of a comprehensive MRSA colonization surveillance program. It is not intended to diagnose MRSA infection nor to guide or monitor treatment for MRSA infections. Performed at [facility], [address] [place], [place] [address]   Culture, blood (routine x 2)     Status: None   Collection  Time: 03/14/18 11:27 AM  Result Value Ref Range Status   Specimen Description BLOOD RIGHT HAND  Final   Special Requests   Final    BOTTLES DRAWN AEROBIC ONLY Blood Culture results may not be optimal due to an inadequate volume of blood received in culture bottles   Culture   Final    NO GROWTH 5 DAYS Performed at [facility], [address] [place], [place] [address]    Report Status 03/19/2018 FINAL  Final  Culture, blood (routine x 2)     Status: None   Collection Time: 03/14/18 11:31 AM  Result Value Ref Range Status   Specimen Description BLOOD RIGHT WRIST  Final   Special Requests   Final    BOTTLES DRAWN AEROBIC ONLY Blood Culture adequate volume   Culture   Final    NO GROWTH 5 DAYS Performed at [facility], [address] [place], [place] [address]    Report Status 03/19/2018  FINAL  Final  Culture, blood (routine x 2)     Status: None   Collection Time: 03/23/18 12:35 AM  Result Value Ref Range Status   Specimen Description BLOOD LEFT WRIST  Final   Special Requests   Final    BOTTLES DRAWN AEROBIC ONLY Blood Culture results may not be optimal due to an inadequate volume of blood received in culture bottles   Culture   Final    NO GROWTH 5 DAYS Performed at [facility], [address], [place], [place] [address]    Report Status 03/28/2018 FINAL  Final  Culture, blood (routine x 2)     Status: None   Collection Time: 03/23/18 12:40 AM  Result Value Ref Range Status   Specimen Description BLOOD LEFT HAND  Final   Special Requests   Final    BOTTLES DRAWN AEROBIC ONLY Blood Culture results may not be optimal due to an inadequate volume of blood received in culture bottles   Culture   Final    NO GROWTH 5 DAYS Performed at [facility], [address], [place], [place] [address]    Report Status 03/28/2018 FINAL  Final  Culture, Urine     Status: Abnormal   Collection Time: 03/25/18  9:30 AM  Result Value Ref Range Status   Specimen Description URINE, RANDOM  Final   Special Requests   Final    NONE Performed at [facility], [address] [place], [place] [address]    Culture >=100,000 COLONIES/mL PROTEUS MIRABILIS (A)  Final   Report Status 03/28/2018 FINAL  Final   Organism ID, Bacteria PROTEUS MIRABILIS (A)  Final      Susceptibility   Proteus mirabilis - MIC*    AMPICILLIN >=32 RESISTANT Resistant     CEFAZOLIN 8 SENSITIVE Sensitive     CEFTRIAXONE <=1 SENSITIVE Sensitive     CIPROFLOXACIN >=4 RESISTANT Resistant     GENTAMICIN <=1 SENSITIVE Sensitive     IMIPENEM 2 SENSITIVE Sensitive     NITROFURANTOIN 256 RESISTANT Resistant     TRIMETH/SULFA >=320 RESISTANT Resistant     AMPICILLIN/SULBACTAM 16 INTERMEDIATE Intermediate     PIP/TAZO <=4 SENSITIVE Sensitive     * >=100,000 COLONIES/mL PROTEUS MIRABILIS  Culture,  blood (Routine X 2) w Reflex to ID Panel     Status: None   Collection Time: 03/28/18 10:40 AM  Result Value Ref Range Status   Specimen Description BLOOD LEFT HAND  Final  Special Requests   Final    BOTTLES DRAWN AEROBIC ONLY Blood Culture results may not be optimal due to an inadequate volume of blood received in culture bottles   Culture   Final    NO GROWTH 5 DAYS Performed at [facility], [address] [place], [place] [address]    Report Status 04/02/2018 FINAL  Final  Culture, blood (Routine X 2) w Reflex to ID Panel     Status: None   Collection Time: 03/28/18 10:48 AM  Result Value Ref Range Status   Specimen Description BLOOD LEFT HAND  Final   Special Requests   Final    BOTTLES DRAWN AEROBIC ONLY Blood Culture adequate volume   Culture   Final    NO GROWTH 5 DAYS Performed at [facility], [address] [place], [place] [address]    Report Status 04/02/2018 FINAL  Final  Culture, Urine     Status: Abnormal   Collection Time: 03/28/18 11:14 AM  Result Value Ref Range Status   Specimen Description URINE, CATHETERIZED  Final   Special Requests   Final    NONE Performed at [facility], [address] [place], [place] [address]    Culture >=100,000 COLONIES/mL PROTEUS MIRABILIS (A)  Final   Report Status 03/30/2018 FINAL  Final   Organism ID, Bacteria PROTEUS MIRABILIS (A)  Final      Susceptibility   Proteus mirabilis - MIC*    AMPICILLIN >=32 RESISTANT Resistant     CEFAZOLIN 8 SENSITIVE Sensitive     CEFTRIAXONE <=1 SENSITIVE Sensitive     CIPROFLOXACIN >=4 RESISTANT Resistant     GENTAMICIN <=1 SENSITIVE Sensitive     IMIPENEM 2 SENSITIVE Sensitive     NITROFURANTOIN 128 RESISTANT Resistant     TRIMETH/SULFA >=320 RESISTANT Resistant     AMPICILLIN/SULBACTAM 16 INTERMEDIATE Intermediate     PIP/TAZO <=4 SENSITIVE Sensitive     * >=100,000 COLONIES/mL PROTEUS MIRABILIS  Culture, blood (routine x 2)     Status: None (Preliminary  result)   Collection Time: 04/02/18  7:55 AM  Result Value Ref Range Status   Specimen Description BLOOD RIGHT HAND  Final   Special Requests   Final    BOTTLES DRAWN AEROBIC AND ANAEROBIC Blood Culture adequate volume   Culture   Final    NO GROWTH 3 DAYS Performed at [facility], [address] [place], [place] [address]    Report Status PENDING  Incomplete  Culture, blood (routine x 2)     Status: Abnormal   Collection Time: 04/02/18  8:07 AM  Result Value Ref Range Status   Specimen Description BLOOD LEFT HAND  Final   Special Requests   Final    BOTTLES DRAWN AEROBIC AND ANAEROBIC Blood Culture adequate volume   Culture  Setup Time   Final    GRAM POSITIVE COCCI IN CLUSTERS AEROBIC BOTTLE ONLY CRITICAL RESULT CALLED TO, READ BACK BY AND VERIFIED WITH: PHARMD F WILSON [phone] MLM    Culture (A)  Final    STAPHYLOCOCCUS SPECIES (COAGULASE NEGATIVE) THE SIGNIFICANCE OF ISOLATING THIS ORGANISM FROM A SINGLE SET OF BLOOD CULTURES WHEN MULTIPLE SETS ARE DRAWN IS UNCERTAIN. PLEASE NOTIFY THE MICROBIOLOGY DEPARTMENT WITHIN ONE WEEK IF SPECIATION AND SENSITIVITIES ARE REQUIRED. Performed at [facility], [address] [place], [place] [address]    Report Status 04/05/2018 FINAL  Final  Blood Culture ID Panel (Reflexed)  Status: Abnormal   Collection Time: 04/02/18  8:07 AM  Result Value Ref Range Status   Enterococcus species NOT DETECTED NOT DETECTED Final   Listeria monocytogenes NOT DETECTED NOT DETECTED Final   Staphylococcus species DETECTED (A) NOT DETECTED Final    Comment: Methicillin (oxacillin) resistant coagulase negative staphylococcus. Possible blood culture contaminant (unless isolated from more than one blood culture draw or clinical case suggests pathogenicity). No antibiotic treatment is indicated for blood  culture contaminants. CRITICAL RESULT CALLED TO, READ BACK BY AND VERIFIED WITH: PHARMD F WILSON [id number] MLM    Staphylococcus  aureus NOT DETECTED NOT DETECTED Final   Methicillin resistance DETECTED (A) NOT DETECTED Final    Comment: CRITICAL RESULT CALLED TO, READ BACK BY AND VERIFIED WITH: PHARMD F WILSON [id number] MLM    Streptococcus species NOT DETECTED NOT DETECTED Final   Streptococcus agalactiae NOT DETECTED NOT DETECTED Final   Streptococcus pneumoniae NOT DETECTED NOT DETECTED Final   Streptococcus pyogenes NOT DETECTED NOT DETECTED Final   Acinetobacter baumannii NOT DETECTED NOT DETECTED Final   Enterobacteriaceae species NOT DETECTED NOT DETECTED Final   Enterobacter cloacae complex NOT DETECTED NOT DETECTED Final   Escherichia coli NOT DETECTED NOT DETECTED Final   Klebsiella oxytoca NOT DETECTED NOT DETECTED Final   Klebsiella pneumoniae NOT DETECTED NOT DETECTED Final   Proteus species NOT DETECTED NOT DETECTED Final   Serratia marcescens NOT DETECTED NOT DETECTED Final   Haemophilus influenzae NOT DETECTED NOT DETECTED Final   Neisseria meningitidis NOT DETECTED NOT DETECTED Final   Pseudomonas aeruginosa NOT DETECTED NOT DETECTED Final   Candida albicans NOT DETECTED NOT DETECTED Final   Candida glabrata NOT DETECTED NOT DETECTED Final   Candida krusei NOT DETECTED NOT DETECTED Final   Candida parapsilosis NOT DETECTED NOT DETECTED Final   Candida tropicalis NOT DETECTED NOT DETECTED Final    Comment: Performed at [facility], [address], [place], [place] [address]  Culture, Urine     Status: None   Collection Time: 04/02/18  1:32 PM  Result Value Ref Range Status   Specimen Description URINE, RANDOM  Final   Special Requests NONE  Final   Culture   Final    NO GROWTH Performed at [facility], [address], [place], [place] [address]    Report Status 04/03/2018 FINAL  Final    PROCEDURE NOTE  After sterile prep/drape, urethra was anesthetized with 2% viscous lidocaine. Flexible cystoscope was easily advanced into bladder. No urethral or bladder abnormalities  noted. Stent grasped and removed. Pt tolerated procedure well.  Studies/Results: No results found.  Assessment/Plan:   H/O right prox ureteral stone, s/p stenting. Stent removed today w/o difficulty. Right perc tube draining well.  I'll request right nephrostogram for Tuesday. If free antegrade passage of contrast down ureter, tube can be removed. If no free flow (from residual stone(s)), OK to d/c to facility w/ tube draining and we will arrange outpt treatment.    LOS: 26 days   Chelsea Aus 04/06/2018, 6:15 AM

## 2018-04-06 NOTE — Progress Notes (Addendum)
0715 Bedside shift report, pt sleeping, easy to arouse, opens eyes not speaking at time of shift change. Falls back asleep easily, WCTM.   0900 Pt up, eating breakfast with help of nursing aid. Pt speaking, can answer appropriately at times to questions. Pt assessed, see flow sheet. Does not follow commands at all times, pt grinding teeth, medicated for pain. Pt yells profanities when being turned or repositioned. RN and nursing aid attempting to sooth patient. [facility]   1230 Pt sitting up eating lunch,   1400 Pt down to IR via bed.   1445 Pt back from IR. Right nephrostomy tube intact, light red drainage noted.   1700 RN fed pt dinner. Pt ate 100%.   1830 Pt had small BM. Pt cleaned up and pad changed, peri care performed. Pt resting in bed, grinding teeth at times.

## 2018-04-06 NOTE — Progress Notes (Signed)
Re: nephrostogram--if free flow down ureter, OK to remove tube. If obstruction, leave in for further out pt mgmt.

## 2018-04-06 NOTE — Progress Notes (Addendum)
PROGRESS NOTE    Cody [name]  ZOX:[id number] DOB: 02/04/1966 DOA: 03/10/2018 PCP: Shelbie AmmonsHaque, Imran P, MD   Brief Narrative:  52 year old male TBI, spastic left hemiplegia DM 2, hypothyroid, bipolar. Admitted 03/11/2018 increasing lethargy X 48 hours. Admitted with sepsis protocol started-found to have AKI creatinine 2.1 BUN 40 T-max 102.6. WBC 15 CT abdomen pelvis right obstructive uropathy 7X5 UVJ stone. CT head = extensive encephalomalacia slight interval enlargement of the ventricular system compared to 2009 CT. Presumed sepsis from acute pyelonephritis started on Vanco Zosyn given 4 L of fluid and kept on stepdown unit. Urologist was consulted underwent cystoscopy and placement of 6 French X 24 cm JJ stent 7/25.   His hospitalization has been prolonged.  It's been complicated by persistent fevers.  ID was consulted and he's been treated for aspiration pneumonia.  He also developed hypernatremia due to diabetes insipidus and nephrology was consulted.  He continued to have fevers over the past week.  His urine cultures grew proteus, susceptible to ceftriaxone.  He had a CT abdomen/pelvis which showed possible obstruction/infection and based on discussion with nephrology he had a right perc nephrostomy tube placed.  ID was reconsulted due to recurrent fevers after this procedure and plan to treat for 10-14 day course.  On 8/20 he had nephrostogram per urology.  Assessment & Plan:    Principal Problem:   Acute pyelonephritis Active Problems:   Depression   Diabetes mellitus without complication (HCC)   Hypertension   Spastic hemiplegia (HCC)   Sepsis (HCC)   AKI (acute kidney injury) (HCC)   Hydroureteronephrosis-right   Ureterovesical junction (UVJ) obstruction   Acute metabolic encephalopathy   Acute respiratory failure with hypoxia (HCC)   Pressure injury of skin   Right kidney stone   Hypernatremia   Aspiration into airway   Palliative care encounter   Diabetes insipidus  (HCC)   Sepsis likely multifactorial  Persistent fever Pt with R obsteructive uropathy with 7 x 5 mm R ureterovesical junction stone causing hydroureteronephrosis and perinephric stranding (with multiple nonobstructing calculi measuring up to 10 mm) S/p cystoscopy with R retrograde ureteropyelogram, fluoscopic interpretation, placement of 6 french by 24 cm contour double J stent without tether on 7/25 Pt with recurrent fevers and infectious disease was consulted due to fever without a clear source.   He was started on unasyn for possible aspiration pneumonia and then transitioned to augmentin.  He has completed course of treatment.  Patient continued to have fevers. Some concern for drug fever? Recurrent fevers since perc neph tube placement.  Last fever 8/16 around noon (but of note, pt with temp to 100 on 8/19, not sure if this was core temp).   Repeat bcx pending (1/2 with coag neg staph, likely contaminant, follow) Urine cx NG from 8/16 (urine cx from 8/11 with proteus) CT chest with improved aeration and partial clearing of RUL pulm infiltrate  L buttock with decubitus ulcer (reviewed imaging with rads and no osteo) LLE with no evidence of acute bony abnormality on imaging Appreciate ID assistance, follow UE US for DVT (negative prelim).  Planning for 10-14 days of abx. Will continue ceftriaxone CT abdomen pelvis with R ureteral stent with mild residual hydronephrosis, delayed R renal nephrogram and swelling of R kidney with respect to L may indicate abnormal renal function 2/2 obstruction or infection - urology recommended perc nephrostomy tube placement.  Now s/p perc nephrostomy tube placement on 7/25. Urology removed double J stent 8/19, recommending nephrostogram today (if free flow down  ureter, can remove tube, if obstruction, outpatient management).  Tachycardia: improved, follow  Severe hypernatremia thought to be secondary to central diabetes insipidus/acute kidney  injury Improved, continue to monitor  Patient was seen by nephrology.   He was placed on desmopressin 20 mcg nasal BID and dextrose infusion.   Sodium level has improved  He has been tolerating his diet and water.   Discussed with nursing, encourage PO free water intake, at least 250 cc PO 6x daily He will need continued encouragement to consume water even at skilled nursing facility.  We will continue with current dose of desmopressin intranasally.   Will need periodic monitoring of his sodium levels.   If he develops hypernatremia despite these measures then further discussions will need to be held with family as the only way to provide him free water than would be to place a PEG tube (discussed with sister this possibility today)  Cholelithiasis: seen on imaging, doesn't seem to have any abdominal pain.  Normal LFT's.  But large stone in gallbladder neck.  No evidence of cholecystitis.  Follow.   History of traumatic brain injury with left hemiplegia Patient is stable.  Patient has a shunt.  This was discussed with neurosurgery who did not feel like his symptoms were secondary to shunt malfunction.  Neurologically he appears to have improved.  He is probably close to his baseline.  Per neurosurgery, consider shunt evaluation after abx.   Chronic aspiration/oropharyngeal dysphagia He is on dysphagia 1 diet.  Speech therapy has seen.  Remains at high risk for recurrent aspirations.  Palliative medicine has been following.   History of bipolar disorder He is on medications which are being continued.  Stage 3 pressure injury  Noted in L buttock and heel (previous note mentioned R posterior thigh, but I did not note any here on my exam today).  Wound care.  No active infection appreciated.  Continue frequent turns.  F/u x ray L ankle (as above).  Diabetes mellitus type 2 Continue long-acting insulin and SSI.  BG's stable.  Acute metabolic encephalopathy Most likely secondary to  acute infection.  Appears close to baseline.  Stable..  Acute respiratory failure with hypoxia Secondary to aspiration.  Resolved.  No PE noted on CT angiogram done on 7/29.  Normocytic anemia Received transfusion for hb of 6.7 8/14 -> improved to 8.8.   Start iron No evidence of bleeding, follow.  Stable today, follow.  Thrombocytopenia:  Drifting down, was in this range earlier in hospitalization, continue to monitor for now.  Negative HIV.  Follow hepatitis panel.  Possible medication related?  Would continue to monitor and follow outpatient.  Consider heme c/s if continuing to worsen.  Essential hypertension Blood pressure is well controlled.  Patient is on metoprolol.  Obstructive uropathy Status post stent placement -> now s/p perc neph tube placed on 8/14   Imaging studies did suggest a retained guidewire as well as proximal aspect of stent may be in renal parenchyma.   Urology is aware of this.   Follow-up with urology after discharge.  Foley removed.  Follow post voids.  Nonsustained Vtach: follow, no echo to eval EF.  Will continue to monitor.    Hypokalemia: replete, follow  DVT prophylaxis: lovenox (held on 8/14 with stent placement, resumed 8/16) Code Status: full  Family Communication: discussed with sister 8/19 Disposition Plan: pending improvement, possibly within next 24-48 hours   Consultants:   Urology  Neurosurgery  ID  Nephrology  Palliative  Procedures:  On 7/25: Cystoscopy, right retrograde ureteropyelogram, fluoroscopic interpretation, placement of 6 French by 24 cm contour double-J stent without tether  Lower extremity Doppler study  Negative for DVT  8/14 R perc nephrostomy tube   Antimicrobials: Anti-infectives (From admission, onward)   Start     Dose/Rate Route Frequency Ordered Stop   03/31/18 1330  ciprofloxacin (CIPRO) IVPB 400 mg     400 mg 200 mL/hr over 60 Minutes Intravenous To Radiology 03/31/18 1236 03/31/18 1830    03/29/18 1430  cefTRIAXone (ROCEPHIN) 1 g in sodium chloride 0.9 % 100 mL IVPB     1 g 200 mL/hr over 30 Minutes Intravenous Every 24 hours 03/29/18 1430     03/19/18 1400  amoxicillin-clavulanate (AUGMENTIN) 875-125 MG per tablet 1 tablet     1 tablet Oral Every 12 hours 03/19/18 0911 03/23/18 2224   03/18/18 1700  piperacillin-tazobactam (ZOSYN) IVPB 3.375 g  Status:  Discontinued     3.375 g 12.5 mL/hr over 240 Minutes Intravenous Every 8 hours 03/18/18 1343 03/19/18 0910   03/17/18 1400  Ampicillin-Sulbactam (UNASYN) 3 g in sodium chloride 0.9 % 100 mL IVPB  Status:  Discontinued     3 g 200 mL/hr over 30 Minutes Intravenous Every 6 hours 03/17/18 1300 03/18/18 1337   03/14/18 1430  ceFEPIme (MAXIPIME) 2 g in sodium chloride 0.9 % 100 mL IVPB  Status:  Discontinued     2 g 200 mL/hr over 30 Minutes Intravenous Every 24 hours 03/14/18 1136 03/15/18 0956   03/14/18 0100  vancomycin (VANCOCIN) 500 mg in sodium chloride 0.9 % 100 mL IVPB  Status:  Discontinued     500 mg 100 mL/hr over 60 Minutes Intravenous Every 12 hours 03/13/18 1510 03/15/18 0956   03/11/18 2000  vancomycin (VANCOCIN) IVPB 1000 mg/200 mL premix  Status:  Discontinued     1,000 mg 200 mL/hr over 60 Minutes Intravenous Every 24 hours 03/10/18 2053 03/11/18 1342   03/11/18 1400  vancomycin (VANCOCIN) IVPB 750 mg/150 ml premix  Status:  Discontinued     750 mg 150 mL/hr over 60 Minutes Intravenous Every 12 hours 03/11/18 1342 03/13/18 1510   03/11/18 0400  piperacillin-tazobactam (ZOSYN) IVPB 3.375 g  Status:  Discontinued     3.375 g 12.5 mL/hr over 240 Minutes Intravenous Every 8 hours 03/10/18 2053 03/14/18 1136   03/10/18 2030  piperacillin-tazobactam (ZOSYN) IVPB 3.375 g     3.375 g 100 mL/hr over 30 Minutes Intravenous  Once 03/10/18 2023 03/10/18 2139   03/10/18 2030  vancomycin (VANCOCIN) IVPB 1000 mg/200 mL premix     1,000 mg 200 mL/hr over 60 Minutes Intravenous  Once 03/10/18 2023 03/10/18 2208      Subjective: Mumbling, grinding teeth.  Objective: Vitals:   04/05/18 2037 04/05/18 2107 04/06/18 0400 04/06/18 0851  BP:  (!) 154/88 134/75   Pulse: (!) 121 (!) 119 99 95  Resp:  (!) 30 (!) 24   Temp:  100 F (37.8 C) 98.4 F (36.9 C)   TempSrc:  Axillary Axillary   SpO2:  100% 100%   Weight:   61.7 kg   Height:        Intake/Output Summary (Last 24 hours) at 04/06/2018 1200 Last data filed at 04/06/2018 0851 Gross per 24 hour  Intake 1429 ml  Output 1025 ml  Net 404 ml   Filed Weights   04/04/18 0415 04/05/18 0343 04/06/18 0400  Weight: 56.2 kg 58.5 kg 61.7 kg    Examination:  General: No acute distress. Cardiovascular: Heart sounds show a regular rate, and rhythm. Lungs: Clear to auscultation bilaterally  Abdomen: Soft, nontender, nondistended Neurological: Alert and disoriented. Cranial nerves II through XII grossly intact. Skin: Warm and dry. No rashes or lesions. Extremities: No clubbing or cyanosis. No edema. Psychiatric: Mood and affect are normal. Insight and judgment are appropriate.   Data Reviewed: I have personally reviewed following labs and imaging studies  CBC: Recent Labs  Lab 03/31/18 1616  04/02/18 0432 04/03/18 0431 04/03/18 1614 04/04/18 0405 04/05/18 0551 04/06/18 0505  WBC 12.1*   < > 12.8* 9.8  --  8.9 10.0 9.8  NEUTROABS 6.9  --   --   --   --   --   --   --   HGB 7.7*   < > 8.8* 8.1* 8.5* 8.6* 8.7* 8.5*  HCT 25.8*   < > 30.1* 28.1* 28.9* 29.9* 30.4* 28.6*  MCV 88.1   < > 89.3 90.1  --  90.6 89.7 88.5  PLT 257   < > 225 157  --  141* 130* 122*   < > = values in this interval not displayed.   Basic Metabolic Panel: Recent Labs  Lab 04/02/18 0432 04/03/18 0431 04/03/18 1614 04/04/18 0405 04/05/18 0551 04/06/18 0505  NA 148* 150* 146* 147* 147* 142  K 4.0 3.4* 3.7 3.4* 3.6 3.3*  CL 113* 117* 114* 113* 115* 111  CO2 26 28 28 27 29 26   GLUCOSE 98 93 116* 132* 114* 142*  BUN 28* 21* 19 18 18 20   CREATININE 0.83 0.71 0.74  0.71 0.68 0.75  CALCIUM 7.6* 7.6* 7.7* 7.9* 7.7* 7.4*  MG 2.1 2.0  --  2.0 2.0 2.0   GFR: Estimated Creatinine Clearance: 95.3 mL/min (by C-G formula based on SCr of 0.75 mg/dL). Liver Function Tests: Recent Labs  Lab 03/31/18 0344 04/02/18 0432  AST 20 17  ALT 15 15  ALKPHOS 85 103  BILITOT 0.4 0.3  PROT 8.5* 9.5*  ALBUMIN 1.4* 1.6*   No results for input(s): LIPASE, AMYLASE in the last 168 hours. No results for input(s): AMMONIA in the last 168 hours. Coagulation Profile: Recent Labs  Lab 03/31/18 1616  INR 1.29   Cardiac Enzymes: No results for input(s): CKTOTAL, CKMB, CKMBINDEX, TROPONINI in the last 168 hours. BNP (last 3 results) No results for input(s): PROBNP in the last 8760 hours. HbA1C: No results for input(s): HGBA1C in the last 72 hours. CBG: Recent Labs  Lab 04/05/18 1205 04/05/18 1638 04/05/18 2101 04/06/18 0731 04/06/18 1122  GLUCAP 106* 124* 143* 110* 141*   Lipid Profile: No results for input(s): CHOL, HDL, LDLCALC, TRIG, CHOLHDL, LDLDIRECT in the last 72 hours. Thyroid Function Tests: No results for input(s): TSH, T4TOTAL, FREET4, T3FREE, THYROIDAB in the last 72 hours. Anemia Panel: No results for input(s): VITAMINB12, FOLATE, FERRITIN, TIBC, IRON, RETICCTPCT in the last 72 hours. Sepsis Labs: Recent Labs  Lab 03/31/18 0344 04/02/18 0432 04/03/18 0431  PROCALCITON 0.23 0.30 0.22    Recent Results (from the past 240 hour(s))  Culture, blood (Routine X 2) w Reflex to ID Panel     Status: None   Collection Time: 03/28/18 10:40 AM  Result Value Ref Range Status   Specimen Description BLOOD LEFT HAND  Final   Special Requests   Final    BOTTLES DRAWN AEROBIC ONLY Blood Culture results may not be optimal due to an inadequate volume of blood received in culture bottles   Culture  Final    NO GROWTH 5 DAYS Performed at [facility], [address], [place], [place] [address]    Report Status 04/02/2018 FINAL  Final  Culture,  blood (Routine X 2) w Reflex to ID Panel     Status: None   Collection Time: 03/28/18 10:48 AM  Result Value Ref Range Status   Specimen Description BLOOD LEFT HAND  Final   Special Requests   Final    BOTTLES DRAWN AEROBIC ONLY Blood Culture adequate volume   Culture   Final    NO GROWTH 5 DAYS Performed at [facility], [address] [place], [place] [address]    Report Status 04/02/2018 FINAL  Final  Culture, Urine     Status: Abnormal   Collection Time: 03/28/18 11:14 AM  Result Value Ref Range Status   Specimen Description URINE, CATHETERIZED  Final   Special Requests   Final    NONE Performed at [facility], [address], [place], [place] [address]    Culture >=100,000 COLONIES/mL PROTEUS MIRABILIS (A)  Final   Report Status 03/30/2018 FINAL  Final   Organism ID, Bacteria PROTEUS MIRABILIS (A)  Final      Susceptibility   Proteus mirabilis - MIC*    AMPICILLIN >=32 RESISTANT Resistant     CEFAZOLIN 8 SENSITIVE Sensitive     CEFTRIAXONE <=1 SENSITIVE Sensitive     CIPROFLOXACIN >=4 RESISTANT Resistant     GENTAMICIN <=1 SENSITIVE Sensitive     IMIPENEM 2 SENSITIVE Sensitive     NITROFURANTOIN 128 RESISTANT Resistant     TRIMETH/SULFA >=320 RESISTANT Resistant     AMPICILLIN/SULBACTAM 16 INTERMEDIATE Intermediate     PIP/TAZO <=4 SENSITIVE Sensitive     * >=100,000 COLONIES/mL PROTEUS MIRABILIS  Culture, blood (routine x 2)     Status: None (Preliminary result)   Collection Time: 04/02/18  7:55 AM  Result Value Ref Range Status   Specimen Description BLOOD RIGHT HAND  Final   Special Requests   Final    BOTTLES DRAWN AEROBIC AND ANAEROBIC Blood Culture adequate volume   Culture   Final    NO GROWTH 3 DAYS Performed at [facility], [address], [place], [place] [address]    Report Status PENDING  Incomplete  Culture, blood (routine x 2)     Status: Abnormal   Collection Time: 04/02/18  8:07 AM  Result Value Ref Range Status   Specimen  Description BLOOD LEFT HAND  Final   Special Requests   Final    BOTTLES DRAWN AEROBIC AND ANAEROBIC Blood Culture adequate volume   Culture  Setup Time   Final    GRAM POSITIVE COCCI IN CLUSTERS AEROBIC BOTTLE ONLY CRITICAL RESULT CALLED TO, READ BACK BY AND VERIFIED WITH: PHARMD F WILSON [id number] MLM    Culture (A)  Final    STAPHYLOCOCCUS SPECIES (COAGULASE NEGATIVE) THE SIGNIFICANCE OF ISOLATING THIS ORGANISM FROM A SINGLE SET OF BLOOD CULTURES WHEN MULTIPLE SETS ARE DRAWN IS UNCERTAIN. PLEASE NOTIFY THE MICROBIOLOGY DEPARTMENT WITHIN ONE WEEK IF SPECIATION AND SENSITIVITIES ARE REQUIRED. Performed at [facility], [address] [place], [place] [address]    Report Status 04/05/2018 FINAL  Final  Blood Culture ID Panel (Reflexed)     Status: Abnormal   Collection Time: 04/02/18  8:07 AM  Result Value Ref Range Status   Enterococcus species NOT DETECTED NOT DETECTED Final   Listeria monocytogenes NOT DETECTED NOT DETECTED  Final   Staphylococcus species DETECTED (A) NOT DETECTED Final    Comment: Methicillin (oxacillin) resistant coagulase negative staphylococcus. Possible blood culture contaminant (unless isolated from more than one blood culture draw or clinical case suggests pathogenicity). No antibiotic treatment is indicated for blood  culture contaminants. CRITICAL RESULT CALLED TO, READ BACK BY AND VERIFIED WITH: PHARMD F WILSON [id number] MLM    Staphylococcus aureus NOT DETECTED NOT DETECTED Final   Methicillin resistance DETECTED (A) NOT DETECTED Final    Comment: CRITICAL RESULT CALLED TO, READ BACK BY AND VERIFIED WITH: PHARMD F WILSON [id number] MLM    Streptococcus species NOT DETECTED NOT DETECTED Final   Streptococcus agalactiae NOT DETECTED NOT DETECTED Final   Streptococcus pneumoniae NOT DETECTED NOT DETECTED Final   Streptococcus pyogenes NOT DETECTED NOT DETECTED Final   Acinetobacter baumannii NOT DETECTED NOT DETECTED Final   Enterobacteriaceae  species NOT DETECTED NOT DETECTED Final   Enterobacter cloacae complex NOT DETECTED NOT DETECTED Final   Escherichia coli NOT DETECTED NOT DETECTED Final   Klebsiella oxytoca NOT DETECTED NOT DETECTED Final   Klebsiella pneumoniae NOT DETECTED NOT DETECTED Final   Proteus species NOT DETECTED NOT DETECTED Final   Serratia marcescens NOT DETECTED NOT DETECTED Final   Haemophilus influenzae NOT DETECTED NOT DETECTED Final   Neisseria meningitidis NOT DETECTED NOT DETECTED Final   Pseudomonas aeruginosa NOT DETECTED NOT DETECTED Final   Candida albicans NOT DETECTED NOT DETECTED Final   Candida glabrata NOT DETECTED NOT DETECTED Final   Candida krusei NOT DETECTED NOT DETECTED Final   Candida parapsilosis NOT DETECTED NOT DETECTED Final   Candida tropicalis NOT DETECTED NOT DETECTED Final    Comment: Performed at [facility], [address] [place], [place] [address]  Culture, Urine     Status: None   Collection Time: 04/02/18  1:32 PM  Result Value Ref Range Status   Specimen Description URINE, RANDOM  Final   Special Requests NONE  Final   Culture   Final    NO GROWTH Performed at [facility], [address] [place], [place] [address]    Report Status 04/03/2018 FINAL  Final         Radiology Studies: No results found.      Scheduled Meds: . sodium chloride   Intravenous Once  . baclofen  10 mg Oral TID  . clonazePAM  0.5 mg Oral Q24H  . clonazePAM  1 mg Oral BID  . desmopressin  20 mcg Nasal BID  . divalproex  500 mg Oral TID  . enoxaparin (LOVENOX) injection  40 mg Subcutaneous Q24H  . feeding supplement (ENSURE ENLIVE)  237 mL Oral TID BM  . feeding supplement (PRO-STAT SUGAR FREE 64)  30 mL Oral BID BM  . ferrous sulfate  325 mg Oral Q breakfast  . free water  250 mL Oral 6 X Daily  . insulin aspart  0-5 Units Subcutaneous QHS  . insulin aspart  0-9 Units Subcutaneous TID WC  . insulin detemir  10 Units Subcutaneous Daily  . mouth rinse  15 mL  Mouth Rinse BID  . metoprolol tartrate  50 mg Oral QHS  . metoprolol tartrate  75 mg Oral Daily  . multivitamin with minerals  1 tablet Oral Daily  . polyethylene glycol  17 g Oral Daily  . potassium chloride  40 mEq Oral Once  . QUEtiapine  125 mg Oral QHS  . QUEtiapine  150 mg Oral Daily  .  selenium sulfide  1 application Topical Q T,Th,Sat-1800  . sertraline  100 mg Oral Daily  . sodium chloride flush  5 mL Intracatheter Q8H   Continuous Infusions: . sodium chloride Stopped (04/02/18 1451)  . cefTRIAXone (ROCEPHIN)  IV 1 g (04/05/18 1450)     LOS: 26 days    Time spent: over 30 min MDM mod with multiple medical problems.    Lacretia Nicksaldwell Powell, MD [facility] Pager [phone]  If 7PM-7AM, please contact night-coverage [email or web address] Password [email or web address] 04/06/2018, 12:00 PM

## 2018-04-07 DIAGNOSIS — N132 Hydronephrosis with renal and ureteral calculous obstruction: Secondary | ICD-10-CM

## 2018-04-07 LAB — HEPATITIS PANEL, ACUTE
HCV Ab: 0.4 s/co ratio (ref 0.0–0.9)
HEP B S AG: NEGATIVE
Hep A IgM: NEGATIVE
Hep B C IgM: NEGATIVE

## 2018-04-07 LAB — CULTURE, BLOOD (ROUTINE X 2)
Culture: NO GROWTH
Special Requests: ADEQUATE

## 2018-04-07 LAB — GLUCOSE, CAPILLARY
GLUCOSE-CAPILLARY: 131 mg/dL — AB (ref 70–99)
Glucose-Capillary: 137 mg/dL — ABNORMAL HIGH (ref 70–99)
Glucose-Capillary: 156 mg/dL — ABNORMAL HIGH (ref 70–99)

## 2018-04-07 MED ORDER — FERROUS SULFATE 325 (65 FE) MG PO TABS: 325.0000 mg | ORAL_TABLET | Freq: Every day | ORAL | Status: AC

## 2018-04-07 MED ORDER — ENSURE ENLIVE PO LIQD: 237.0000 mL | Freq: Three times a day (TID) | ORAL | Status: DC

## 2018-04-07 MED ORDER — PRO-STAT SUGAR FREE PO LIQD: 30.0000 mL | Freq: Two times a day (BID) | ORAL | Status: AC

## 2018-04-07 MED ORDER — CEPHALEXIN 500 MG PO CAPS: 500.0000 mg | ORAL_CAPSULE | Freq: Three times a day (TID) | ORAL | Status: AC

## 2018-04-07 MED ORDER — INSULIN DETEMIR 100 UNIT/ML ~~LOC~~ SOLN: 10.0000 [IU] | Freq: Every day | SUBCUTANEOUS | Status: AC

## 2018-04-07 MED ORDER — INSULIN ASPART 100 UNIT/ML ~~LOC~~ SOLN: 0.0000 [IU] | Freq: Three times a day (TID) | SUBCUTANEOUS | Status: AC

## 2018-04-07 MED ORDER — FREE WATER: 250.0000 mL | Freq: Every day | Status: DC

## 2018-04-07 MED ORDER — DESMOPRESSIN ACETATE SPRAY 0.01 % NA SOLN: 20.0000 ug | Freq: Two times a day (BID) | NASAL | Status: AC

## 2018-04-07 MED ORDER — ACETAMINOPHEN 325 MG PO TABS: 650.0000 mg | ORAL_TABLET | Freq: Four times a day (QID) | ORAL | Status: AC | PRN

## 2018-04-07 MED ORDER — CLONAZEPAM 1 MG PO TABS: 0.5000 mg | ORAL_TABLET | ORAL | 0 refills | Status: AC

## 2018-04-07 NOTE — Care Management Important Message (Signed)
Important Message  Patient Details  Name: Cody [name] MRN: [id number] Date of Birth: 06/13/1966   Medicare Important Message Given:  Yes    Donatella Walski P Stela Iwasaki 04/07/2018, 4:13 PM

## 2018-04-07 NOTE — Clinical Social Work Placement (Signed)
   CLINICAL SOCIAL WORK PLACEMENT  NOTE  Date:  04/07/2018  Patient Details  Name: Cody [name] MRN: [id number] Date of Birth: 09/19/1965  Clinical Social Work is seeking post-discharge placement for this patient at the Skilled  Nursing Facility level of care (*CSW will initial, date and re-position this form in  chart as items are completed):  Yes   Patient/family provided with [place] Clinical Social Work Department's list of facilities offering this level of care within the geographic area requested by the patient (or if unable, by the patient's family).  Yes   Patient/family informed of their freedom to choose among providers that offer the needed level of care, that participate in Medicare, Medicaid or managed care program needed by the patient, have an available bed and are willing to accept the patient.  Yes   Patient/family informed of [facility]'s ownership interest in [facility] and [facility], as well as of the fact that they are under no obligation to receive care at these facilities.  PASRR submitted to EDS on       PASRR number received on       Existing PASRR number confirmed on 03/16/18     FL2 transmitted to all facilities in geographic area requested by pt/family on 03/16/18     FL2 transmitted to all facilities within larger geographic area on       Patient informed that his/her managed care company has contracts with or will negotiate with certain facilities, including the following:  [facility], [place]     Yes   Patient/family informed of bed offers received.  [facility], [facility]     Physician recommends and patient chooses bed at      [facility] on 04/07/18.  Patient to be transferred to facility by [facility]     Patient family notified on 04/07/18 of transfer.  Name of family member notified:  Junious DresserConnie, sister     PHYSICIAN Please prepare priority  discharge summary, including medications, Please sign FL2, Please prepare prescriptions     Additional Comment:    _______________________________________________ Abigail ButtsSusan Izaias Krupka, LCSW 04/07/2018, 10:55 AM

## 2018-04-07 NOTE — [place] FL2 (Signed)
[place] MEDICAID FL2 LEVEL OF CARE SCREENING TOOL     IDENTIFICATION  Patient Name: Cody RanBobby Chenard Birthdate: 05/22/1966 Sex: male Admission Date (Current Location): 03/10/2018  [facility] [place]   Facility and Address:  [facility], [address], [place], [place] [address]      Provider Number: [id number]  Attending Physician Name and Address:  Elease EtienneHongalgi, Anand D, MD  Relative Name and Phone Number:  Jorene MinorsConnie Jones, sister, [phone]    Current Level of Care: Hospital Recommended Level of Care: Skilled Nursing Facility Prior Approval Number:    Date Approved/Denied:   PASRR Number: [id number]  Discharge Plan: SNF    Current Diagnoses: Patient Active Problem List   Diagnosis Date Noted  . Diabetes insipidus (HCC)   . Right kidney stone   . Hypernatremia   . Aspiration into airway   . Palliative care encounter   . Pressure injury of skin 03/12/2018  . Sepsis (HCC) 03/11/2018  . AKI (acute kidney injury) (HCC) 03/11/2018  . Abdominal tenderness 03/11/2018  . Acute pyelonephritis 03/11/2018  . Hydroureteronephrosis-right 03/11/2018  . 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 Acute metabolic encephalopathy 03/11/2018  . Acute respiratory failure with hypoxia (HCC) 03/11/2018  . Depression   . Diabetes mellitus without complication (HCC)   . Hypertension   . Spastic hemiplegia (HCC)   . Altered mental status     Orientation RESPIRATION BLADDER Height & Weight     Self  Normal Incontinent, Indwelling catheter(nephrostomy tube) Weight: 139 lb (63 kg) Height:  5\' 9"  (175.3 cm)  BEHAVIORAL SYMPTOMS/MOOD NEUROLOGICAL BOWEL NUTRITION STATUS      Incontinent Diet(dysphagia, please see DC summary)  AMBULATORY STATUS COMMUNICATION OF NEEDS Skin   (baseline) Verbally PU Stage and Appropriate Care(PU II L hip, PU II L ankle)                       Personal Care Assistance Level of Assistance   Bathing, Feeding, Dressing(baseline)           Functional Limitations Info  Sight, Hearing, Speech Sight Info: Adequate Hearing Info: Adequate Speech Info: Adequate    SPECIAL CARE FACTORS FREQUENCY                       Contractures Contractures Info: Not present    Additional Factors Info  Code Status, Allergies, Psychotropic, Insulin Sliding Scale Code Status Info: Full Allergies Info: Not on File Psychotropic Info: klonopin, divalproex, seroquel, zoloft Insulin Sliding Scale Info: novolog 3x/day with meals and at bedtime, levemir daily       Current Medications (04/07/2018):  This is the current hospital active medication list Current Facility-Administered Medications  Medication Dose Route Frequency Provider Last Rate Last Dose  . 0.9 %  sodium chloride infusion (Manually program via Guardrails IV Fluids)   Intravenous Once Kirby-Graham, Beather ArbourKaren J, NP      . 0.9 %  sodium chloride infusion   Intravenous PRN Rhetta MuraSamtani, Jai-Gurmukh, MD   Stopped at 04/02/18 1451  . acetaminophen (TYLENOL) tablet 650 mg  650 mg Oral Q6H PRN Edsel PetrinMikhail, Maryann, DO   650 mg at 03/29/18 1851  . albuterol (PROVENTIL) (2.5 MG/3ML) 0.083% nebulizer solution 2.5 mg  2.5 mg Nebulization Q4H PRN Marcine Matarahlstedt, Stephen, MD      . baclofen (LIORESAL) tablet 10 mg  10 mg Oral TID Marcine Matarahlstedt, Stephen, MD   10 mg at 04/07/18 [id number]  . cefTRIAXone (ROCEPHIN)  1 g in sodium chloride 0.9 % 100 mL IVPB  1 g Intravenous Q24H Zigmund DanielPowell, A Caldwell Jr., MD 200 mL/hr at 04/06/18 1506 1 g at 04/06/18 1506  . clonazePAM (KLONOPIN) tablet 0.5 mg  0.5 mg Oral Q24H Lorretta HarpNiu, Xilin, MD   0.5 mg at 04/06/18 1502  . clonazePAM (KLONOPIN) tablet 1 mg  1 mg Oral BID Marcine Matarahlstedt, Stephen, MD   1 mg at 04/07/18 0953  . desmopressin (DDAVP NASAL) 0.01 % solution 20 mcg  20 mcg Nasal BID Osvaldo ShipperKrishnan, Gokul, MD   20 mcg at 04/06/18 2125  . dextromethorphan-guaiFENesin (MUCINEX DM) 30-600 MG per 12 hr tablet 1 tablet  1 tablet Oral BID PRN  Marcine Matarahlstedt, Stephen, MD   1 tablet at 03/24/18 1354  . divalproex (DEPAKOTE SPRINKLE) capsule 500 mg  500 mg Oral TID Marcine Matarahlstedt, Stephen, MD   500 mg at 04/07/18 0954  . enoxaparin (LOVENOX) injection 40 mg  40 mg Subcutaneous Q24H Zigmund DanielPowell, A Caldwell Jr., MD   40 mg at 04/06/18 2123  . feeding supplement (ENSURE ENLIVE) (ENSURE ENLIVE) liquid 237 mL  237 mL Oral TID BM Zigmund DanielPowell, A Caldwell Jr., MD   237 mL at 04/07/18 0954  . feeding supplement (PRO-STAT SUGAR FREE 64) liquid 30 mL  30 mL Oral BID BM Rhetta MuraSamtani, Jai-Gurmukh, MD   30 mL at 04/07/18 0954  . ferrous sulfate tablet 325 mg  325 mg Oral Q breakfast Zigmund DanielPowell, A Caldwell Jr., MD   325 mg at 04/07/18 0951  . free water 250 mL  250 mL Oral 6 X Daily Zigmund DanielPowell, A Caldwell Jr., MD   250 mL at 04/07/18 0954  . HYDROcodone-acetaminophen (NORCO/VICODIN) 5-325 MG per tablet 1-2 tablet  1-2 tablet Oral Q4H PRN Opyd, Lavone Neriimothy S, MD   1 tablet at 04/07/18 0453  . ibuprofen (ADVIL,MOTRIN) tablet 400 mg  400 mg Oral Q8H PRN Rhetta MuraSamtani, Jai-Gurmukh, MD   400 mg at 04/06/18 2124  . insulin aspart (novoLOG) injection 0-5 Units  0-5 Units Subcutaneous QHS Zigmund DanielPowell, A Caldwell Jr., MD   Stopped at 04/01/18 2126  . insulin aspart (novoLOG) injection 0-9 Units  0-9 Units Subcutaneous TID WC Zigmund DanielPowell, A Caldwell Jr., MD   1 Units at 04/06/18 1240  . insulin detemir (LEVEMIR) injection 10 Units  10 Units Subcutaneous Daily Marcine Matarahlstedt, Stephen, MD   10 Units at 04/07/18 [phone]  . iopamidol (ISOVUE-300) 61 % injection 50 mL  50 mL Other Once PRN Richarda OverlieHenn, Adam, MD      . MEDLINE mouth rinse  15 mL Mouth Rinse BID Lorretta HarpNiu, Xilin, MD   15 mL at 04/07/18 0955  . metoprolol tartrate (LOPRESSOR) tablet 50 mg  50 mg Oral QHS Mikhail, Vestavia HillsMaryann, DO   50 mg at 04/05/18 2037  . metoprolol tartrate (LOPRESSOR) tablet 75 mg  75 mg Oral Daily Edsel PetrinMikhail, Maryann, DO   75 mg at 04/07/18 0951  . multivitamin with minerals tablet 1 tablet  1 tablet Oral Daily Marcine Matarahlstedt, Stephen, MD   1 tablet at 04/07/18 [phone]  .  phenylephrine-shark liver oil-mineral oil-petrolatum (PREPARATION H) rectal ointment 1 application  1 application Rectal TID PRN Marcine Matarahlstedt, Stephen, MD      . polyethylene glycol (MIRALAX / GLYCOLAX) packet 17 g  17 g Oral Daily Marcine Matarahlstedt, Stephen, MD   17 g at 04/01/18 1133  . QUEtiapine (SEROQUEL) tablet 125 mg  125 mg Oral QHS Lorretta HarpNiu, Xilin, MD   125 mg at 04/05/18 2039  . QUEtiapine (SEROQUEL) tablet 150 mg  150 mg  Oral Daily [name], MD   150 mg at 04/07/18 1610  . selenium sulfide (SELSUN) 1 % shampoo 1 application  1 application Topical Q T,Th,Sat-1800 [name], MD   1 application at 04/06/18 1514  . sertraline (ZOLOFT) tablet 100 mg  100 mg Oral Daily [name], MD   100 mg at 04/07/18 0956  . sodium chloride flush (NS) 0.9 % injection 5 mL  5 mL Intracatheter Q8H Richarda Overlie, MD   5 mL at 04/07/18 0602     Discharge Medications: Please see discharge summary for a list of discharge medications.  Relevant Imaging Results:  Relevant Lab Results:   Additional Information SSN: [id number]  [name], LCSW

## 2018-04-07 NOTE — Progress Notes (Signed)
Clinical Social Worker facilitated patient discharge including contacting patient family and facility to confirm patient discharge plans.  Clinical information faxed to facility and family agreeable with plan.  CS[facility]  RN to call [phone] for report prior to discharge.  Clinical Social Worker will sign off for now as social work intervention is no longer needed. Please consult us again if new need arises.  Marrianne MoodAshley Cash Duce, MSW, [employer]CSWA [phone]

## 2018-04-07 NOTE — Discharge Summary (Addendum)
Physician Discharge Summary  Cody [name] ZOX:[id number] DOB: [birth year]  PCP: Cody Ammons, MD  Admit date: 03/10/2018 Discharge date: 04/07/2018  Recommendations for Outpatient Follow-up:  1. MD at SNF in 3 days with repeat labs (CBC & BMP). 2. Encourage free water intake. 3. Strict intake output charting 4. Dr. Marcine [name], [facility]: Office will arrange outpatient follow-up appointment for visit followed by visit with renal ultrasound in 6 weeks. 5. Dr. Estill [name], Nephrology in 2 weeks.  SNF to coordinate appointment. 6. Dr. Donnel [name], PCP upon discharge from SNF. 7. Recommend follow-up chest x-ray in 4 weeks to ensure resolution of pneumonia findings. 8. Consider repeating 2D echo as outpatient. 9. Consider outpatient Neurosurgery consultation and follow-up, please see discussion below.  Home Health: Patient being discharged to SNF/[facility], [place]. Equipment/Devices: Patient has right percutaneous Nephrostomy that needs to be followed up with outpatient urology.  Discharge Condition: Improved and stable. CODE STATUS: Full Diet recommendation: As per speech therapy recommendations as follows:  Recommendations  Diet recommendations: Dysphagia 1 (puree);Thin liquid Liquids provided via: Cup;Straw Medication Administration: Crushed with puree Supervision: Staff to assist with self feeding;Full supervision/cueing for compensatory strategies Compensations: Minimize environmental distractions;Slow rate;Small sips/bites Postural Changes and/or Swallow Maneuvers: Seated upright 90 degrees;Upright 30-60 min after meal     Discharge Diagnoses:  Principal Problem:   Acute pyelonephritis Active Problems:   Depression   Diabetes mellitus without complication (HCC)   Hypertension   Spastic hemiplegia (HCC)   Sepsis (HCC)   AKI (acute kidney injury) (HCC)   Hydroureteronephrosis-right   Ureterovesical junction (UVJ) obstruction   Acute  metabolic encephalopathy   Acute respiratory failure with hypoxia (HCC)   Pressure injury of skin   Right kidney stone   Hypernatremia   Aspiration into airway   Palliative care encounter   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  He was admitted with sepsis and noted to have acute kidney injury with BUN 40 and creatinine 2.1.  CT abdomen and pelvis showed right obstructive uropathy with 7 x 5 UVJ stone.  CT head showed extensive encephalomalacia with slight interval enlargement of the ventricular system compared to CT of 2009.  His sepsis was presumed due to acute pyelonephritis.  He was treated initially per sepsis protocol with aggressive IV fluids and broad-spectrum IV antibiotics.  Urology was consulted and underwent multiple procedures as indicated below.  His hospital course was complicated by persistent fevers for which ID was consulted.  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  Assessment and plan:  Sepsis likely multifactorial  Persistent fever/Proteus mirabilis acute pyelonephritis or complicated UTI Pt with R obsteructive uropathy with 7 x 5 mm R ureterovesical junction stone causing hydroureteronephrosis and perinephric stranding (with multiple nonobstructing calculi measuring up to 10 mm) S/p cystoscopy with R retrograde ureteropyelogram, fluoscopic interpretation, placement of 6 french by 24 cm contour double J stent without tether on 7/25 Pt with recurrent fevers and infectious disease was consulted due to fever without a clear source.  He was started on unasyn for possible aspiration pneumonia and then transitioned to augmentin.  He completed course of treatment for this but continued to have fevers.  There was some concern for drug  fever. He had recurrent fevers since perc neph tube placement.  MRSA PCR negative.  Urine culture 7/24: Negative.  Blood cultures x2 from 7/24, 7/28, 8/6 & 8/11: Negative.  Urine culture from 8/8 and 8/11 showed >100,000 colonies per mL of Proteus mirabilis sensitive to ceftriaxone.  Follow-up urine culture 8/16: Negative.  1 of 2 blood cultures from 8/16+ for coagulase-negative staph which was deemed a contaminant and a second sample was negative. CT chest with improved aeration and partial clearing of RUL pulm infiltrate  L buttock with decubitus ulcer (reviewed imaging with rads and no osteo) LLE with no evidence of acute bony abnormality on imaging Appreciate ID assistance, follow LUE venous Doppler which was negative for DVT in left upper extremity and in the right subclavian vein.   Bilateral lower extremity venous Dopplers were also negative for DVT.   ID was consulted and recommended total of 10 to 14 days of antibiotics.  Patient completed 9 days of IV ceftriaxone in the hospital and will be discharged on additional 5 days of oral Keflex as per ID recommendations to complete total 14 days course. CT abdomen pelvis with R ureteral stent with mild residual hydronephrosis, delayed R renal nephrogram and swelling of R kidney with respect to L may indicate abnormal renal function 2/2 obstruction or infection - urology recommended perc nephrostomy tube placement.  Now s/p perc nephrostomy tube placement on 7/25. Urology removed double J stent 8/19 and underwent nephrostomy on 8/20 the results of which I reviewed with Dr. Retta Diones, urology who recommends discharging patient to SNF with nephrostomy tube and his office will arrange close outpatient follow-up and patient may need lithotripsy.  Tachycardia: Likely related to sepsis.  Resolved.  Severe hypernatremia thought to be secondary to central diabetes insipidus/acute kidney injury Patient was seen by nephrology. He was placed on desmopressin  20 mcg nasal BID and dextrose infusion.  Hypernatremia has resolved. He has been tolerating his diet and water.  As discussed with nursing, encourage PO free water intake, at least 250 cc PO 6x daily He will need continued encouragement to consume water even at skilled nursing facility.  We will continue with current dose of desmopressin intranasally.  Will need periodic monitoring of his sodium levels.  If he develops hypernatremia despite these measures then further discussions will need to be held with family as the only way to provide him free water than would be to place a PEG tube (this was discussed with patient's sister on 8/20) I discussed with the last nephrologist who saw him in the hospital who recommended continuing current dose of desmopressin, encouraging oral free water intake at SNF, maintaining strict intake output, monitoring periodic BMP and if no further hypernatremia then may consider weaning off desmopressin gradually.  Also recommended outpatient nephrology follow-up.  Cholelithiasis: seen on imaging, doesn't seem to have any abdominal pain.  Normal LFT's.  But large stone in gallbladder neck.  No evidence of cholecystitis.  Follow as outpatient.   History of traumatic brain injury with left hemiplegia Patient is stable. Patient has a shunt. This was discussed with neurosurgery who did not feel like his symptoms were secondary to shunt malfunction. Neurologically he appears to have improved. He is probably close to his baseline.  Per neurosurgery, consider shunt evaluation after abx.  Recommend outpatient follow-up with neurosurgery as deemed necessary.  Chronic aspiration/oropharyngeal dysphagia He is on dysphagia 1 diet. Speech therapy has seen. Remains at high risk for recurrent aspirations. Palliative medicine has been following.   History of bipolar disorder He is on medications which are being continued.  Stage  3 pressure injury  Noted in L buttock  and heel (previous note mentioned R posterior thigh, but my partner did not note any here on his exam).  Wound care. No active infection appreciated.  Continue frequent turns.  F/u x ray L ankle (as above).  Diabetes mellitus type 2 Continue long-acting insulin (at reduced dose) and SSI. BG's stable.  Adjust insulins as needed.  Acute metabolic encephalopathy Most likely secondary to acute infection.  Mental status changes have resolved andapears close to baseline.  Stable..  Acute respiratory failure with hypoxia Secondary to aspiration. Resolved. No PE noted on CT angiogram done on 7/29.  Normocytic anemia Received transfusion for hb of 6.7 8/14 -> improved to 8.8.  Hemoglobin has been stable in the 8 g range for several days.  Started iron No evidence of overt bleeding except some expected hematuria in the nephrostomy tube.  Thrombocytopenia:  Drifting down, was in this range earlier in hospitalization, continue to monitor for now.  Negative HIV.    Acute hepatitis panel negative.  Possible medication related/antibiotics or even acute infection?  Would continue to monitor and follow outpatient.  Consider heme c/s if continuing to worsen.  Essential hypertension Blood pressure starting to intermittently trend up.  Patient was continued on prior home dose of metoprolol.  Amlodipine which was temporarily held in the hospital will be resumed at discharge.  Obstructive uropathy Status post stent placement -> now s/p perc neph tube placed on 8/14  Imaging studies did suggest a retained guidewire as well as proximal aspect of stent may be in renal parenchyma.  Urology is aware of this.  Follow-up with urology after discharge.  Foley removed.  Follow post voids.  Nonsustained Vtach: follow, no echo to eval EF.  No further episodes noted on telemetry at least in the last 24 hours.  Last TTE 2/3/9 showed LVEF 55-65%.  May consider repeating TTE as outpatient.  Hypokalemia:  replete, follow as needed as outpatient.    Consultants:   Urology  Neurosurgery  ID  Nephrology  Palliative  Procedures:  On 7/25: Cystoscopy, right retrograde ureteropyelogram, fluoroscopic interpretation, placement of 6 French by 24 cm contour double-J stent without tether  Lower extremity Doppler study  Negative for DVT  8/14 R perc nephrostomy tube   Discharge Instructions  Discharge Instructions    Call MD for:  difficulty breathing, headache or visual disturbances   Complete by:  As directed    Call MD for:  extreme fatigue   Complete by:  As directed    Call MD for:  persistant dizziness or light-headedness   Complete by:  As directed    Call MD for:  persistant nausea and vomiting   Complete by:  As directed    Call MD for:  redness, tenderness, or signs of infection (pain, swelling, redness, odor or green/yellow discharge around incision site)   Complete by:  As directed    Call MD for:  severe uncontrolled pain   Complete by:  As directed    Call MD for:  temperature >100.4   Complete by:  As directed    Diet - low sodium heart healthy   Complete by:  As directed    Dysphagia 1 diet and thin liquids as per speech therapy recommendations.   Increase activity slowly   Complete by:  As directed        Medication List    STOP taking these medications   amoxicillin-clavulanate 875-125 MG tablet Commonly known as:  AUGMENTIN   UNABLE TO FIND     TAKE these medications   acetaminophen 325 MG tablet Commonly known as:  TYLENOL Take 2 tablets (650 mg total) by mouth every 6 (six) hours as needed for mild pain, moderate pain or fever. What changed:    when to take this  reasons to take this  additional instructions   amLODipine 10 MG tablet Commonly known as:  NORVASC Take 10 mg by mouth daily.   baclofen 10 MG tablet Commonly known as:  LIORESAL Take 10 mg by mouth 3 (three) times daily.   cephALEXin 500 MG capsule Commonly known  as:  KEFLEX Take 1 capsule (500 mg total) by mouth 3 (three) times daily for 5 days.   clonazePAM 1 MG tablet Commonly known as:  KLONOPIN Take 0.5-1 tablets (0.5-1 mg total) by mouth See admin instructions. Take 1 tablet at 8 am and 8 pm then Take 1/2 tablet at 2 pm   desmopressin 0.01 % solution Commonly known as:  DDAVP NASAL Place 2 sprays (20 mcg total) into the nose 2 (two) times daily.   divalproex 125 MG capsule Commonly known as:  DEPAKOTE SPRINKLE Take 500 mg by mouth 3 (three) times daily.   feeding supplement (ENSURE ENLIVE) Liqd Take 237 mLs by mouth 3 (three) times daily between meals.   feeding supplement (PRO-STAT SUGAR FREE 64) Liqd Take 30 mLs by mouth 2 (two) times daily. What changed:  when to take this   ferrous sulfate 325 (65 FE) MG tablet Take 1 tablet (325 mg total) by mouth daily with breakfast. Start taking on:  04/08/2018   free water Soln Take 250 mLs by mouth 6 (six) times daily.   insulin aspart 100 UNIT/ML injection Commonly known as:  novoLOG Inject 0-9 Units into the skin 3 (three) times daily with meals. CBG < 70: implement hypoglycemia protocol CBG 70 - 120: 0 units CBG 121 - 150: 1 unit CBG 151 - 200: 2 units CBG 201 - 250: 3 units CBG 251 - 300: 5 units CBG 301 - 350: 7 units CBG 351 - 400: 9 units CBG > 400: call MD.   insulin detemir 100 UNIT/ML injection Commonly known as:  LEVEMIR Inject 0.1 mLs (10 Units total) into the skin daily. Start taking on:  04/08/2018 What changed:  how much to take   ipratropium-albuterol 0.5-2.5 (3) MG/3ML Soln Commonly known as:  DUONEB Take 3 mLs by nebulization 4 (four) times daily.   metoprolol tartrate 25 MG tablet Commonly known as:  LOPRESSOR Take 75 mg by mouth daily.   metoprolol tartrate 50 MG tablet Commonly known as:  LOPRESSOR Take 50 mg by mouth at bedtime.   multivitamin with minerals Tabs tablet Take 1 tablet by mouth daily.   phenylephrine-shark liver oil-mineral  oil-petrolatum 0.25-3-14-71.9 % rectal ointment Commonly known as:  PREPARATION H Place 1 application rectally 3 (three) times daily as needed for hemorrhoids.   polyethylene glycol packet Commonly known as:  MIRALAX / GLYCOLAX Take 17 g by mouth daily.   QUEtiapine 100 MG tablet Commonly known as:  SEROQUEL Take 100-150 mg by mouth See admin instructions. Take 1 and 1/2 tablets every morning then take 1 tablet with 25 mg at bedtime to equal 125 mg   QUEtiapine 25 MG tablet Commonly known as:  SEROQUEL Take 25 mg by mouth at bedtime. Take with 100 mg to equal 125 mg   selenium sulfide 1 % Lotn Commonly known as:  SELSUN Apply 1  application topically every Tuesday, Thursday, and Saturday at 6 PM.   sertraline 100 MG tablet Commonly known as:  ZOLOFT Take 100 mg by mouth daily.       Contact information for follow-up providers    Cody Matar, MD Follow up.   Specialty:  Urology Why:  call for followup appt to have visit and followup kidney ultrasound in 6 weeks Contact information: [address] [place] [address] [phone]        Cody Ammons, MD. Schedule an appointment as soon as possible for a visit.   Specialty:  Internal Medicine Why:  Upon discharge from SNF. Contact information: [address] [place] [address] [phone]        MD at SNF. Schedule an appointment as soon as possible for a visit in 3 day(s).   Why:  To be seen with repeat labs (CBC & BMP).       Tyler Pita, MD. Schedule an appointment as soon as possible for a visit in 2 week(s).   Specialty:  Internal Medicine Contact information: [address] [place] [address] [phone]            Contact information for after-discharge care    Destination    [facility] .   Service:  Skilled Nursing Contact information: [address] [place] [address] [phone]                 Not on  File    Procedures/Studies: Ct Abdomen Pelvis Wo Contrast  Result Date: 03/11/2018 CLINICAL DATA:  Fever and lethargy. EXAM: CT ABDOMEN AND PELVIS WITHOUT CONTRAST TECHNIQUE: Multidetector CT imaging of the abdomen and pelvis was performed following the standard protocol without IV contrast. COMPARISON:  None. FINDINGS: LOWER CHEST: Small pleural effusions with bibasilar atelectasis. HEPATOBILIARY: Normal hepatic contours and density. No intra- or extrahepatic biliary dilatation. Cholelithiasis without acute inflammation. PANCREAS: Normal parenchymal contours without ductal dilatation. No peripancreatic fluid collection. SPLEEN: Normal. ADRENALS/URINARY TRACT: --Adrenal glands: Left adrenal myelolipoma. --Right kidney/ureter: There is a 7 x 5 mm stone at the right ureterovesical junction causing mild hydroureteronephrosis and perinephric stranding. There are 2-3 other nonobstructing renal calculi measuring approximately 2 mm. --Left kidney/ureter: No hydronephrosis, nephroureterolithiasis, perinephric stranding or solid renal mass. --Urinary bladder: Normal for degree of distention STOMACH/BOWEL: --Stomach/Duodenum: No hiatal hernia or other gastric abnormality. Normal duodenal course. --Small bowel: No dilatation or inflammation. --Colon: No focal abnormality. --Appendix: Not visualized. No right lower quadrant inflammation or free fluid. VASCULAR/LYMPHATIC: Infrarenal IVC filter. Minimal aortic calcification. No abdominal or pelvic lymphadenopathy. REPRODUCTIVE: Normal prostate and seminal vesicles. MUSCULOSKELETAL. 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 Soft tissue thickening extending to the skin surface superficial to the left greater trochanter (axial image 93). OTHER: None. IMPRESSION: 1. Right obstructive uropathy with 7 x 5 mm right ureterovesical junction stone causing mild hydroureteronephrosis and perinephric stranding. Multiple nonobstructing right renal calculi measuring up to 10 mm.  2. 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 This may indicate early decubitus ulcer formation. 3. Small pleural effusions and bibasilar atelectasis. 4. Aortic Atherosclerosis (ICD10-I70.0). Electronically Signed   By: Deatra Robinson M.D.   On: 03/11/2018 00:51   Dg Skull 1-3 Views  Result Date: 03/12/2018 CLINICAL DATA:  Recent shunt malfunction EXAM: SKULL - 1-3 VIEW COMPARISON:  09/19/2007 FINDINGS: Right-sided ventriculostomy catheter is noted extending across the midline  to the left. The catheter as visualized is intact. Postsurgical changes in the cervical spine are noted. No other focal abnormality is seen. IMPRESSION: Intact shunt catheter as visualized. Electronically Signed   By: Alcide Clever M.D.   On: 03/12/2018 10:26   Dg Chest 2 View  Result Date: 03/30/2018 CLINICAL DATA:  Fever EXAM: CHEST - 2 VIEW COMPARISON:  March 28, 2018 FINDINGS: There is persistent airspace consolidation in the right upper lobe with volume loss. There is atelectatic change in the left base. Heart is mildly enlarged with pulmonary venous hypertension. No adenopathy. No appreciable bone lesions. IMPRESSION: Persistent pneumonia right upper lobe. Left base atelectasis. Pulmonary vascular congestion present. Electronically Signed   By: Bretta Bang III M.D.   On: 03/30/2018 21:27   Dg Chest 2 View  Result Date: 03/22/2018 CLINICAL DATA:  Pneumonia. EXAM: CHEST - 2 VIEW COMPARISON:  Chest x-rays dated 03/18/2018 and 03/15/2018 and chest CT dated 03/15/2018 FINDINGS: Ventriculoperitoneal shunt tube overlies the right hemithorax. Heart size and vascularity are normal. There is increased density at the right lung apex since the prior study with small patchy areas of infiltrate in the right midzone peripherally. Left lung is clear. No discrete effusions. No acute bone abnormality. Arthritic changes of both  glenohumeral joints. IMPRESSION: 1. Increased infiltrate at the right lung apex. 2. Stable faint infiltrates in the right midzone. Electronically Signed   By: Francene Boyers M.D.   On: 03/22/2018 07:55   Dg Chest 2 View  Result Date: 03/18/2018 CLINICAL DATA:  Pneumonia. EXAM: CHEST - 2 VIEW COMPARISON:  Radiograph of March 15, 2018. FINDINGS: Stable cardiomediastinal silhouette. Right-sided ventriculoperitoneal shunt is again noted. No pneumothorax is noted. Hypoinflation of the lungs is noted. Slightly increased right upper lobe airspace opacity is noted concerning for pneumonia. Minimal bibasilar subsegmental atelectasis noted. Bony thorax is unremarkable. IMPRESSION: Slightly increased right upper lobe airspace opacity is noted consistent with pneumonia. Minimal bibasilar subsegmental atelectasis. Electronically Signed   By: Lupita Raider, M.D.   On: 03/18/2018 09:16   Dg Cervical Spine 1 View  Result Date: 03/12/2018 CLINICAL DATA:  Shunt malfunction EXAM: DG CERVICAL SPINE - 1 VIEW COMPARISON:  None. FINDINGS: Visualize shunt catheter in the right neck is intact. No acute abnormality is noted. Postsurgical changes in the cervical spine are noted. IMPRESSION: Intact shunt catheter. Electronically Signed   By: Alcide Clever M.D.   On: 03/12/2018 10:26   Dg Abd 1 View  Result Date: 03/12/2018 CLINICAL DATA:  Shunt malfunction EXAM: ABDOMEN - 1 VIEW COMPARISON:  None. FINDINGS: Shunt catheter is noted in the right upper quadrant with the tip projecting over the superior aspect of the right lobe of the liver. IVC filter is noted in place. No bony abnormality is seen. A ureteral stent is noted on the right. Radiopaque density is noted in the distal aspect of the stent which may represent a wire fragment. Correlation with the operative findings is recommended. The superior aspect of the right ureteral stent is elongated and likely within the right kidney due to patient rotation. No other focal abnormality  is noted. IMPRESSION: Intact shunt catheter in the right upper abdomen. Right ureteral stent identified. There is a metallic density identified in the distal aspect of the stent likely representing a retained wire fragment. Clinical correlation is recommended. The proximal portion of the right ureteral stent is elongated and not coiled in the normal fashion although likely present in the right collecting system. Again correlation with the operative  findings is recommended. These results will be called to the ordering clinician or representative by the Radiologist Assistant, and communication documented in the PACS or zVision Dashboard. Electronically Signed   By: Alcide Clever M.D.   On: 03/12/2018 10:30   Ct Head Wo Contrast  Result Date: 03/11/2018 CLINICAL DATA:  Fever lethargy abnormal speech EXAM: CT HEAD WITHOUT CONTRAST TECHNIQUE: Contiguous axial images were obtained from the base of the skull through the vertex without intravenous contrast. COMPARISON:  CT brain 09/19/2007 FINDINGS: Brain: No acute territorial infarction, hemorrhage or intracranial mass is visualized. Right posterior shunt catheter with tip terminating in the anterior aspect of the left lateral ventricle. Extensive encephalomalacia involving the right cerebral hemisphere with encephalomalacia at the inferior left frontal lobe. Moderate severe atrophy and volume loss, slight progression since comparison CT. Slight interval enlargement of the dilated ventricular system, bifrontal measurement of 6 cm, as compared with 5.5 cm on the comparison study. No midline shift. Vascular: No hyperdense vessels.  Carotid vascular calcification Skull: No fracture.  Old left frontal and parietal burr holes. Sinuses/Orbits: Mucosal thickening in the ethmoid sinuses. No acute orbital abnormality. Other: None IMPRESSION: 1. Negative for hemorrhage or intracranial mass lesion. 2. Extensive encephalomalacia of the right hemisphere with encephalomalacia of the  left frontal lobe. Cortical volume loss which appears slightly progressed since the prior CT. 3. Similar appearance and positioning of right posterior shunt catheter. Slight interval enlargement of the ventricular system as compared with 2009 CT, it is unclear if this is secondary to progression of atrophy and volume loss versus some degree of shunt malfunction. Electronically Signed   By: Jasmine Pang M.D.   On: 03/11/2018 00:50   Ct Chest W Contrast  Result Date: 04/02/2018 CLINICAL DATA:  Persistent fever EXAM: CT CHEST WITH CONTRAST TECHNIQUE: Multidetector CT imaging of the chest was performed during intravenous contrast administration. CONTRAST:  OMNIPAQUE IOHEXOL 300 MG/ML  SOLN COMPARISON:  Radiograph 03/30/2018, CT chest 03/15/2018, CT abdomen 03/14/2018 FINDINGS: Cardiovascular: Nonaneurysmal aorta. Common origin of the right brachiocephalic and left common carotid artery. Heart size upper normal. No pericardial effusion Mediastinum/Nodes: Midline trachea. Generous thyroid gland with subcentimeter hypodense nodules. Subcentimeter mediastinal lymph nodes. Esophagus within normal limits. Lungs/Pleura: Improved aeration since comparison CT 03/15/2018. Partial but incomplete clearing of right upper lobe pulmonary infiltrate. Minimal ground-glass density in the left upper lobe. No significant pleural effusion. Upper Abdomen: Fatty enlargement of the left adrenal gland as before. No acute abnormality. Musculoskeletal: No chest wall abnormality. No acute or significant osseous findings. Degenerative changes. IMPRESSION: 1. Improved aeration bilaterally since CT 03/15/2018. Partial but incomplete clearing primarily of right upper lobe pulmonary infiltrate with few scattered foci of ground-glass density elsewhere in the lungs. Electronically Signed   By: Jasmine Pang M.D.   On: 04/02/2018 21:22   Ct Angio Chest Pe W Or Wo Contrast  Result Date: 03/16/2018 CLINICAL DATA:  Ct chest PE. He had audible  congestion (chest/pharyngeal) at baseline which continues today. CXR ordered and is pending. Lethargy. EXAM: CT ANGIOGRAPHY CHEST WITH CONTRAST TECHNIQUE: Multidetector CT imaging of the chest was performed using the standard protocol during bolus administration of intravenous contrast. Multiplanar CT image reconstructions and MIPs were obtained to evaluate the vascular anatomy. CONTRAST:  ISOVUE-370 IOPAMIDOL (ISOVUE-370) INJECTION 76% COMPARISON:  Chest x-ray on 03/15/2018 FINDINGS: Cardiovascular: Pulmonary arteries are moderately well opacified. There is significant streak artifact from arms at patient's side. There is no acute pulmonary embolus. Pulmonary artery is enlarged, 3.9 centimeters. The  heart is mildly enlarged. Bovine arch anatomy. No significant thoracic aortic atherosclerosis or aneurysm. Mediastinum/Nodes: The visualized portion of the thyroid gland has a normal appearance. Esophagus is normal in appearance. No significant mediastinal, hilar, or axillary adenopathy. Lungs/Pleura: There are dense areas of airspace filling throughout the lungs bilaterally. Bilateral small pleural effusions. Upper Abdomen: No acute abnormality. Musculoskeletal: Degenerative changes are seen in thoracic spine. Bilateral gynecomastia. Review of the MIP images confirms the above findings. IMPRESSION: 1. Moderate opacification of pulmonary arteries. No acute pulmonary embolus. Enlargement of pulmonary artery, consistent arterial hypertension. 2. Dense focal areas of airspace filling throughout the lungs bilaterally, likely infectious. 3. Small bilateral pleural effusions. 4. Bovine arch anatomy incidentally noted. Electronically Signed   By: Norva Pavlov M.D.   On: 03/16/2018 10:42   Ct Abdomen Pelvis W Contrast  Result Date: 03/31/2018 CLINICAL DATA:  Fever of unknown origin. Green pus coming out from the urethra around the Foley catheter. EXAM: CT ABDOMEN AND PELVIS WITH CONTRAST TECHNIQUE: Multidetector  CT imaging of the abdomen and pelvis was performed using the standard protocol following bolus administration of intravenous contrast. CONTRAST:  OMNIPAQUE IOHEXOL 300 MG/ML  SOLN COMPARISON:  03/14/2018 FINDINGS: Lower chest: Atelectasis or infiltration in both lung bases with small pleural effusions, improving since previous study. Hepatobiliary: There is a large stone in the gallbladder neck. No gallbladder wall thickening or inflammatory changes. No bile duct dilatation. No focal liver lesions. Pancreas: Unremarkable. No pancreatic ductal dilatation or surrounding inflammatory changes. Spleen: Normal in size without focal abnormality. Adrenals/Urinary Tract: Enlarged left adrenal gland, possibly hyperplastic. Right ureteral stent with proximal pigtail in the upper pole, possibly extending into the parenchyma, and distal pigtail in the bladder. There is some residual hydronephrosis without hydroureter. Delayed right renal nephrogram and swelling of the right kidney with respect to the left may indicate residual abnormal renal function due either to obstruction or possibly infection. Stranding around the right ureter. Cyst in the right kidney as before. Left kidney and ureter are unremarkable. The bladder is decompressed with a Foley catheter in place. Stomach/Bowel: Stomach and small bowel are mostly decompressed. Contrast material is demonstrated in the distal small bowel and colon suggesting no evidence of obstruction. Colon is not abnormally distended. Diffusely stool-filled colon. Appendix is not identified. Vascular/Lymphatic: Aortic atherosclerosis. No enlarged abdominal or pelvic lymph nodes. Inferior vena caval filter is in place. Reproductive: Prostate gland is not enlarged. Other: No free air or free fluid in the abdomen. Mild edema in the subcutaneous fat. Stranding and edema in the pelvis and around the right kidney and ureter. Tubing in the right upper quadrant, possibly representing the distal  portion of ventricular peritoneal shunt. Musculoskeletal: Degenerative changes in the spine. No destructive bone lesions. Prominent degenerative changes in the hips. IMPRESSION: 1. Large stone in the gallbladder neck. No bile duct dilatation. No changes of cholecystitis. 2. Right ureteral stent with mild residual hydronephrosis. Delayed right renal nephrogram and swelling of the right kidney with respect to the left may indicate abnormal renal function due to obstruction or infection. 3. Stranding and edema in the subcutaneous fat of the pelvis and around the right kidney and ureter. 4. Foley catheter decompresses the bladder. 5. Tubing in the right upper quadrant, possibly representing the distal portion of the ventricular peritoneal shunt. 6. Atelectasis or infiltration in both lung bases. 7. Inferior vena caval filter. Electronically Signed   By: Burman Nieves M.D.   On: 03/31/2018 01:36   Ct Abdomen Pelvis W Contrast  Result Date: 03/15/2018 CLINICAL DATA:  Right-sided abdominal pain/tenderness.  Sepsis. EXAM: CT ABDOMEN AND PELVIS WITH CONTRAST TECHNIQUE: Multidetector CT imaging of the abdomen and pelvis was performed using the standard protocol following bolus administration of intravenous contrast. CONTRAST:  OMNIPAQUE IOHEXOL 300 MG/ML  SOLN CONTRAST EXTRAVASATION CONSULTATION: Type of contrast:  Isovue 300 Site of extravasation: Left hand Estimated volume of extravasation: 50 ml Area of extravasation scanned with CT? Yes PATIENT'S SIGNS AND SYMPTOMS Skin blistering/ulceration: no Decrease capillary refill: no Change in skin color: no Decreased motor function or severe tightness: Tightness present at site of extravasation, however patient with contracture and hemiplegia throughout the left upper extremity Decreased pulses distal to site of extravasation: no Altered sensation: Patient nonresponsive, no secondary signs of pain. Increasing pain or signs of increased swelling during observation:  Patient nonresponsive, no secondary signs of pain. TREATMENT Observation period at site: Inpatient to returned to the floor. Limb elevation: Requested per protocol Ice packs applied: Requested per protocol Heat pads applied: Requested per protocol Plastic surgery consulted? no DOCUMENTATION AND FOLLOW-UP Site contrast extravasation forms submitted? yes Post extravasation orders completed? yes Was additional follow up assigned to PA's? Yes Patient's questions answered? Patient nonresponsive Patient instructed to call [phone] or seek immediate medical care if symptoms progress. COMPARISON:  Abdominal radiograph 03/12/2018, CT 03/11/2018 FINDINGS: Lower chest: Patchy opacities in the right middle lobe have increased from prior CT. Right greater than left lower lobe opacities with small pleural effusions are similar. Bilateral gynecomastia. Hepatobiliary: Lamellated gallstone in gallbladder, motion artifact at this level limits assessment for pericholecystic inflammation. No focal hepatic lesion. Pancreas: No ductal dilatation or inflammation. Spleen: Normal in size without focal abnormality. Adrenals/Urinary Tract: Left adrenal myelolipoma, unchanged. Right adrenal gland is normal. Interval placement of right ureteral stent, the proximal stent may extend into the renal parenchyma. Distal pigtail is within the urinary bladder, as seen on prior radiograph possible retained wire fragment in the distal aspect of the stent. Persistent but improved right hydroureteronephrosis with small 2 mm residual stone fragment at the ureteropelvic junction adjacent to the stent. Cyst in the anterior mid right kidney. No left hydronephrosis or obstructive uropathy. Urinary bladder is decompressed by Foley catheter. Stomach/Bowel: Stomach physiologically distended. No bowel wall thickening or inflammatory change. No bowel obstruction. Appendix not visualized, no evidence of appendicitis. Small to moderate colonic stool burden with  stool distending the rectum. Vascular/Lymphatic: Mild aortic atherosclerosis. Infrarenal IVC filter in place. Small retroperitoneal lymph nodes likely reactive. No enlarged abdominal or pelvic lymph nodes. Reproductive: Prostate is unremarkable. Other: Ventriculoperitoneal shunt catheter with tip in the left upper quadrant abutting the dome of the liver. No adjacent fluid collection. No free fluid or free air in the abdomen or pelvis. Subcutaneous edema and bilateral flanks with slight increase from prior exam. Musculoskeletal: Bony fusion of the spinous processes of the spine. Bilateral hip osteoarthritis. Previously described soft tissue track posterior to the left hip is not included in the field of view. IMPRESSION: 1. Interval placement of right ureteral stent with small 2 mm stone fragment persisting at the right ureteropelvic junction. Proximal aspect of the stent may extend into the renal parenchyma. Possible retained wire in the distal aspect of the stent as seen on prior radiograph. Mild persistent but improved right hydronephrosis. 2. Gallstone within the gallbladder, motion artifact obscures detailed gallbladder evaluation. 3. Increased patchy and ground-glass opacities in the right middle lobe from recent CT, suggesting pneumonia or aspiration. Bibasilar atelectasis and small pleural effusions are similar.  4. Contrast extravasation within the left hand. Tightness in the region of the extravasation. Patient with left hemiplegia and arm contracture as well as unresponsive which limits clinical evaluation. Recommend close clinical follow-up, as degree of background changes in the region of extravasation is unknown. Electronically Signed   By: Rubye Oaks M.D.   On: 03/15/2018 00:37   Dg Retrograde Pyelogram  Result Date: 03/11/2018 CLINICAL DATA:  Right ureter stone. EXAM: RETROGRADE PYELOGRAM COMPARISON:  CT 03/11/2018 FINDINGS: Single retrograde pyelogram image was submitted. This appears to be  an image of the right ureter and right renal collecting system. Image limited due to motion. There is a filling defect in the proximal right ureter that corresponds with the recent CT findings. IMPRESSION: Stone in the proximal right ureter. Electronically Signed   By: Richarda Overlie M.D.   On: 03/11/2018 07:41   Ir US Guide Vasc Access Left  Result Date: 04/01/2018 INDICATION: 52 year old with history of a right ureter stone and right ureter stent placement. The patient has residual hydronephrosis and concern for infection. Request for percutaneous nephrostomy tube placement. EXAM: PLACEMENT OF RIGHT PERCUTANEOUS NEPHROSTOMY TUBE WITH ULTRASOUND AND FLUOROSCOPIC GUIDANCE PLACEMENT OF PERIPHERAL IV BY MD WITH ULTRASOUND COMPARISON:  None. MEDICATIONS: Inpatient on antibiotics. ANESTHESIA/SEDATION: Fentanyl 100 mcg IV; Versed 2.0 mg IV Moderate Sedation Time:  1 hour and 50 minutes The patient was continuously monitored during the procedure by the interventional radiology nurse under my direct supervision. CONTRAST:  30 mL-administered into the collecting system(s) FLUOROSCOPY TIME:  Fluoroscopy Time: 23 minutes 6 seconds (120 mGy). COMPLICATIONS: None immediate. PROCEDURE: Informed consent was obtained for right percutaneous nephrostomy tube. All questions were addressed. A timeout was performed prior to the initiation of the procedure. Patient was placed prone on the interventional table. Unfortunately, the patient lost IV access at this time. The patient had to be taken off the table. Patient has poor venous access. A patent superficial vein in the left forearm was identified with ultrasound. Ultrasound image was taken and saved for documentation tourniquet was placed. The skin was prepped with chlorhexidine and a 20 gauge peripheral IV was placed in the superficial vein using ultrasound guidance. IV flushed and was secured to the skin. Patient was placed back on the table in a prone position. The right flank was  prepped and draped in a sterile fashion. Maximal barrier sterile technique was utilized including caps, mask, sterile gowns, sterile gloves, sterile drape, hand hygiene and skin antiseptic. 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 Right kidney was identified with ultrasound but difficult to access the mid or lower pole with ultrasound due to the iliac crest. The skin was anesthetized with 1% lidocaine. 21 gauge needle directed into the upper pole collecting system with ultrasound and clear urine was draining. Wire was advanced into the collecting system and Accustick dilator set was placed with difficulty. A stiff Amplatz wire was placed and a 10 [place] drain was advanced over the wire into the collecting system. However, the catheter would not coil into the renal pelvis due to the small size of the pelvis and the existing ureter stent. The catheter was removed over wire in order to improve positioning. Unfortunately, the retention suture on the drain wrapped around the wire. As the retention suture was cut and released, the suture apparently pulled out the wire and access was lost into the right kidney. Small amount of contrast had been injected into the collecting system during placement and there was still retained  contrast within the mid and lower calices. These calices were targeted with fluoroscopic guidance using 22 gauge needles. Initially a mid/upper pole calyx was cannulated and contrast injection confirmed placement in the collecting system. However, it was very difficult to advance a wire into the renal pelvis. Therefore access was attempted from a dilated lower pole calyx. Again, the needle was advanced into the collecting system but wire could not be advanced into the pelvis. Eventually, wire was advanced through the mid/upper pole calyx into the renal pelvis. Accustick dilator set was placed and eventually a 10 French Renee Pain catheter was placed and reconstituted  within the renal pelvis. Contrast injection confirmed placement in the renal pelvis. The catheter was flushed and attached to a gravity bag. Catheter was sutured to the skin. Fluoroscopic and ultrasound images were taken and saved for this procedure. FINDINGS: Mild hydronephrosis in the right kidney. Percutaneous access was obtained from mid/upper pole calyx. Filling defects within the collecting system at end of the procedure are compatible with blood clots. Patient has a ureter stent that extends through the collecting system into the upper pole parenchyma. IMPRESSION: Successful placement of a right percutaneous nephrostomy tube with ultrasound and fluoroscopic guidance. Blood products within the collecting system related to catheter placement. Plan for routine flushing of the catheter until the urine clears. Electronically Signed   By: Richarda Overlie M.D.   On: 04/01/2018 07:49   Dg Chest Port 1 View  Result Date: 03/28/2018 CLINICAL DATA:  Fever. EXAM: PORTABLE CHEST 1 VIEW COMPARISON:  03/22/2018 and older exams. FINDINGS: Lung volumes remain low. There is opacity in the right upper lobe associated with elevation of the minor fissure. This is likely combination of residual pneumonia and atelectasis. It has increased when compared to the most recent prior exam. There is mild streaky opacity at the lung bases that is likely due to atelectasis. Remainder of the lungs is clear. No pulmonary edema. No convincing pleural effusion.  No pneumothorax. Heart normal in size. IMPRESSION: 1. Increased opacity in the right upper lobe when compared to the most recent prior study. This is likely combination of atelectasis and pneumonia. Mid lung hazy opacities noted on the prior study have improved. 2. No other change. Mild basilar atelectasis is similar to the prior study. Electronically Signed   By: Amie Portland M.D.   On: 03/28/2018 10:14   Dg Chest Port 1 View  Result Date: 03/15/2018 CLINICAL DATA:  Fever EXAM:  PORTABLE CHEST 1 VIEW COMPARISON:  03/12/2018 FINDINGS: Overall improved aeration especially in the lung bases. Progression of right upper lobe infiltrate. Improved bibasilar airspace disease. Improved lung volume. No effusion identified. Shunt tubing in the right chest again noted. IMPRESSION: Improved lung volume and improved aeration of the lungs bilaterally. Progression of right upper lobe infiltrate. Electronically Signed   By: Marlan Palau M.D.   On: 03/15/2018 11:57   Dg Chest Port 1 View  Result Date: 03/12/2018 CLINICAL DATA:  Acute in cephalopathy EXAM: PORTABLE CHEST 1 VIEW COMPARISON:  03/10/2018 FINDINGS: Cardiac shadow is stable. The overall inspiratory effort is poor with bibasilar atelectasis right greater than left. No bony abnormality is seen. VP shunt is noted as well as postsurgical change in the cervical spine. IMPRESSION: Poor inspiratory effort with bibasilar atelectasis right greater than left. Electronically Signed   By: Alcide Clever M.D.   On: 03/12/2018 08:22   Dg Chest Portable 1 View  Result Date: 03/10/2018 CLINICAL DATA:  Sepsis. EXAM: PORTABLE CHEST 1 VIEW COMPARISON:  Radiograph of November 20, 2009. FINDINGS: Stable cardiomediastinal silhouette. No pneumothorax or pleural effusion is noted. Stable minimal bibasilar subsegmental atelectasis is noted. Right-sided ventriculoperitoneal shunt is again noted. Bony thorax is unremarkable. IMPRESSION: Stable minimal bibasilar subsegmental atelectasis. Electronically Signed   By: Lupita Raider, M.D.   On: 03/10/2018 19:54   Dg Ankle Left Port  Result Date: 04/02/2018 CLINICAL DATA:  52 year old male with a history of left ankle ulceration EXAM: PORTABLE LEFT ANKLE - 2 VIEW COMPARISON:  None. FINDINGS: Osteopenia. No evidence of acute displaced fracture, however, portions of the bone are excluded. No erosive changes. Degenerative changes of the tibiotalar joint, subtalar joint, and the hindfoot. Beaking at the talus. Degenerative  changes of the midfoot. IMPRESSION: Osteopenia, with no definite acute bony abnormality. Evidence of tarsal coalition Electronically Signed   By: Gilmer Mor D.O.   On: 04/02/2018 13:58   Dg Swallowing Func-speech Pathology  Result Date: 03/17/2018 Objective Swallowing Evaluation: Type of Study: MBS-Modified Barium Swallow Study  Patient Details Name: Kip Cropp MRN: [id number] Date of Birth: April 18, 1966 Today's Date: 03/17/2018 Time: SLP Start Time (ACUTE ONLY): 0932 -SLP Stop Time (ACUTE ONLY): 0952 SLP Time Calculation (min) (ACUTE ONLY): 20 min Past Medical History: Past Medical History: Diagnosis Date . Depression  . Diabetes mellitus without complication (HCC)  . GAD (generalized anxiety disorder)  . Hypertension  . Hypothyroidism  . Spastic hemiplegia (HCC)  . Thyroid disease   hypothyroidism Past Surgical History: Past Surgical History: Procedure Laterality Date . CYSTOSCOPY W/ URETERAL STENT PLACEMENT Right 03/11/2018  Procedure: CYSTOSCOPY WITH RETROGRADE PYELOGRAM/DOUBLE J STENT PLACEMENT;  Surgeon: Cody Matar, MD;  Location: [facility] OR;  Service: Urology;  Laterality: Right; . VENTRICULOPERITONEAL SHUNT   HPI: Cody [name] is a 52 y.o. 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  Chest x-ray showed bilateral basilar subsegment atelectasis. Found to have multiple nonobstructing right renal calculi measuring up to 10 mm and underwent cystoscopy, right retrograde ureteropyelogram, placement of stent. MBS at [facility] end of 2018 he did not aspirate or penetrate, dealyed swallow (thin liq's and appears regular texture recommended). Followed by ST, increased coughing with RN, ST downgraded to puree, continue thin. CXR 7/29 showed Dense focal areas of airspace filling  throughout the lungs bilateral, likely infectious. SLP recommended MBS.  No data recorded Assessment / Plan / Recommendation CHL IP CLINICAL IMPRESSIONS 03/17/2018 Clinical Impression Pt exhibits decreased oral manipulation, coordination and cohesion with thin and puree. Reduced tongue base retection led to premature spill of thin to pyriform sinuses and likely delayed timing and closure of laryngeal vestibule with penetration of thin via straw and large sip. Difficult to fully view with suboptimal positioning and reclined posture in chair from pain and shoulder obstructing view somewhat. Suspect penetration was flash or ejected with pt's spontaneous cough and vestibule and area below vocal cords was clear. Smaller sips aided in increased control and protection with thin. Recommend pt continue Dys 1 (puree), thin, straws allowed with SMALL sips, full supervision, small sips and crush meds. Continue to follow.   SLP Visit Diagnosis Dysphagia, oropharyngeal phase (R13.12) Attention and concentration deficit following -- Frontal lobe and executive function deficit following -- Impact on safety and function Mild aspiration risk;Moderate aspiration risk   CHL IP TREATMENT RECOMMENDATION 03/17/2018 Treatment Recommendations Therapy as outlined in treatment plan below   Prognosis 03/17/2018 Prognosis for  Safe Diet Advancement Good Barriers to Reach Goals Cognitive deficits Barriers/Prognosis Comment -- CHL IP DIET RECOMMENDATION 03/17/2018 SLP Diet Recommendations Thin liquid;Dysphagia 1 (Puree) solids Liquid Administration via Cup;Straw Medication Administration Crushed with puree Compensations Minimize environmental distractions Postural Changes Remain semi-upright after after feeds/meals (Comment)   CHL IP OTHER RECOMMENDATIONS 03/17/2018 Recommended Consults -- Oral Care Recommendations Oral care BID Other Recommendations --   CHL IP FOLLOW UP RECOMMENDATIONS 03/17/2018 Follow up Recommendations None   CHL IP FREQUENCY AND  DURATION 03/17/2018 Speech Therapy Frequency (ACUTE ONLY) min 2x/week Treatment Duration 2 weeks      CHL IP ORAL PHASE 03/17/2018 Oral Phase Impaired Oral - Pudding Teaspoon -- Oral - Pudding Cup -- Oral - Honey Teaspoon -- Oral - Honey Cup -- Oral - Nectar Teaspoon -- Oral - Nectar Cup -- Oral - Nectar Straw -- Oral - Thin Teaspoon -- Oral - Thin Cup Premature spillage;Decreased bolus cohesion Oral - Thin Straw Decreased bolus cohesion Oral - Puree Delayed oral transit;Lingual/palatal residue Oral - Mech Soft -- Oral - Regular -- Oral - Multi-Consistency -- Oral - Pill -- Oral Phase - Comment --  CHL IP PHARYNGEAL PHASE 03/17/2018 Pharyngeal Phase Impaired Pharyngeal- Pudding Teaspoon -- Pharyngeal -- Pharyngeal- Pudding Cup -- Pharyngeal -- Pharyngeal- Honey Teaspoon -- Pharyngeal -- Pharyngeal- Honey Cup -- Pharyngeal -- Pharyngeal- Nectar Teaspoon -- Pharyngeal -- Pharyngeal- Nectar Cup -- Pharyngeal -- Pharyngeal- Nectar Straw -- Pharyngeal -- Pharyngeal- Thin Teaspoon -- Pharyngeal -- Pharyngeal- Thin Cup Reduced tongue base retraction;Delayed swallow initiation-pyriform sinuses;Delayed swallow initiation-vallecula Pharyngeal -- Pharyngeal- Thin Straw Delayed swallow initiation-vallecula;Delayed swallow initiation-pyriform sinuses;Penetration/Aspiration during swallow Pharyngeal Material enters airway, remains ABOVE vocal cords then ejected out Pharyngeal- Puree Delayed swallow initiation-vallecula Pharyngeal -- Pharyngeal- Mechanical Soft -- Pharyngeal -- Pharyngeal- Regular -- Pharyngeal -- Pharyngeal- Multi-consistency -- Pharyngeal -- Pharyngeal- Pill -- Pharyngeal -- Pharyngeal Comment --  [facility] IP CERVICAL ESOPHAGEAL PHASE 03/17/2018 Cervical Esophageal Phase WFL Pudding Teaspoon -- Pudding Cup -- Honey Teaspoon -- Honey Cup -- Nectar Teaspoon -- Nectar Cup -- Nectar Straw -- Thin Teaspoon -- Thin Cup -- Thin Straw -- Puree -- Mechanical Soft -- Regular -- Multi-consistency -- Pill -- Cervical Esophageal  Comment -- Royce Macadamia 03/17/2018, 2:22 PM              Ir Radiology Peripheral Guided Iv Start  Result Date: 04/01/2018 INDICATION: 52 year old with history of a right ureter stone and right ureter stent placement. The patient has residual hydronephrosis and concern for infection. Request for percutaneous nephrostomy tube placement. EXAM: PLACEMENT OF RIGHT PERCUTANEOUS NEPHROSTOMY TUBE WITH ULTRASOUND AND FLUOROSCOPIC GUIDANCE PLACEMENT OF PERIPHERAL IV BY MD WITH ULTRASOUND COMPARISON:  None. MEDICATIONS: Inpatient on antibiotics. ANESTHESIA/SEDATION: Fentanyl 100 mcg IV; Versed 2.0 mg IV Moderate Sedation Time:  1 hour and 50 minutes The patient was continuously monitored during the procedure by the interventional radiology nurse under my direct supervision. CONTRAST:  30 mL-administered into the collecting system(s) FLUOROSCOPY TIME:  Fluoroscopy Time: 23 minutes 6 seconds (120 mGy). COMPLICATIONS: None immediate. PROCEDURE: Informed consent was obtained for right percutaneous nephrostomy tube. All questions were addressed. A timeout was performed prior to the initiation of the procedure. Patient was placed prone on the interventional table. Unfortunately, the patient lost IV access at this time. The patient had to be taken off the table. Patient has poor venous access. A patent superficial vein in the left forearm was identified with ultrasound. Ultrasound image was taken and saved for documentation tourniquet was placed. The skin was  prepped with chlorhexidine and a 20 gauge peripheral IV was placed in the superficial vein using ultrasound guidance. IV flushed and was secured to the skin. Patient was placed back on the table in a prone position. The right flank was prepped and draped in a sterile fashion. Maximal barrier sterile technique was utilized including caps, mask, sterile gowns, sterile gloves, sterile drape, hand hygiene and skin antiseptic. Patient's anatomy is difficult due to the  hemiplegia and contractures. Right kidney was identified with ultrasound but difficult to access the mid or lower pole with ultrasound due to the iliac crest. The skin was anesthetized with 1% lidocaine. 21 gauge needle directed into the upper pole collecting system with ultrasound and clear urine was draining. Wire was advanced into the collecting system and Accustick dilator set was placed with difficulty. A stiff Amplatz wire was placed and a 10 [place] drain was advanced over the wire into the collecting system. However, the catheter would not coil into the renal pelvis due to the small size of the pelvis and the existing ureter stent. The catheter was removed over wire in order to improve positioning. Unfortunately, the retention suture on the drain wrapped around the wire. As the retention suture was cut and released, the suture apparently pulled out the wire and access was lost into the right kidney. Small amount of contrast had been injected into the collecting system during placement and there was still retained contrast within the mid and lower calices. These calices were targeted with fluoroscopic guidance using 22 gauge needles. Initially a mid/upper pole calyx was cannulated and contrast injection confirmed placement in the collecting system. However, it was very difficult to advance a wire into the renal pelvis. Therefore access was attempted from a dilated lower pole calyx. Again, the needle was advanced into the collecting system but wire could not be advanced into the pelvis. Eventually, wire was advanced through the mid/upper pole calyx into the renal pelvis. Accustick dilator set was placed and eventually a 10 French Renee Pain catheter was placed and reconstituted within the renal pelvis. Contrast injection confirmed placement in the renal pelvis. The catheter was flushed and attached to a gravity bag. Catheter was sutured to the skin. Fluoroscopic and ultrasound images were taken and saved  for this procedure. FINDINGS: Mild hydronephrosis in the right kidney. Percutaneous access was obtained from mid/upper pole calyx. Filling defects within the collecting system at end of the procedure are compatible with blood clots. Patient has a ureter stent that extends through the collecting system into the upper pole parenchyma. IMPRESSION: Successful placement of a right percutaneous nephrostomy tube with ultrasound and fluoroscopic guidance. Blood products within the collecting system related to catheter placement. Plan for routine flushing of the catheter until the urine clears. Electronically Signed   By: Richarda Overlie M.D.   On: 04/01/2018 07:49   Ir Nephrostogram Right Thru Existing Access  Result Date: 04/06/2018 INDICATION: Status post right percutaneous nephrostomy tube placement on 03/31/2018 to treat pyelonephritis secondary to a proximal ureteral calculus. A ureteral stent has been removed and nephrostogram is now performed to assess patency of the ureter. EXAM: ANTEGRADE RIGHT NEPHROSTOGRAM THROUGH PRE-EXISTING NEPHROSTOMY TUBE COMPARISON:  Multiple prior CT studies including 03/30/2018, 03/14/2018 and 03/11/2018 MEDICATIONS: None ANESTHESIA/SEDATION: None CONTRAST:  40 mL Isovue-300-administered into the collecting system(s) FLUOROSCOPY TIME:  Fluoroscopy Time: 42 seconds.  11.5 mGy. COMPLICATIONS: None immediate. PROCEDURE: Contrast was injected through the pre-existing right nephrostomy tube. Fluoroscopic cine loop sequences were saved. The nephrostomy tube was  then reconnected to a gravity drainage bag. FINDINGS: With injection of the nephrostomy tube, there is mild hydronephrosis. Antegrade flow of contrast material is seen through the ureter with a persistent filling defect in the proximal ureter just distal to the UPJ consistent with a persistent calculus measuring approximately 4 mm in height. There is flow of contrast around the calculus and in the rest of the ureter down to the level of  the bladder. The proximal ureter just distal to the calculus does demonstrates some persistent narrowing which could reflect some edema. IMPRESSION: Mild persistent right hydronephrosis and filling defect in the proximal ureter secondary to persistent calculus measuring approximately 4 mm. There is flow around the calculus and down the ureter into the bladder. The ureter just below the level of the calculus demonstrates segmental narrowing which may be secondary to edema. Electronically Signed   By: Irish Lack M.D.   On: 04/06/2018 16:55   Ir Nephrostomy Placement Right  Result Date: 04/01/2018 INDICATION: 52 year old with history of a right ureter stone and right ureter stent placement. The patient has residual hydronephrosis and concern for infection. Request for percutaneous nephrostomy tube placement. EXAM: PLACEMENT OF RIGHT PERCUTANEOUS NEPHROSTOMY TUBE WITH ULTRASOUND AND FLUOROSCOPIC GUIDANCE PLACEMENT OF PERIPHERAL IV BY MD WITH ULTRASOUND COMPARISON:  None. MEDICATIONS: Inpatient on antibiotics. ANESTHESIA/SEDATION: Fentanyl 100 mcg IV; Versed 2.0 mg IV Moderate Sedation Time:  1 hour and 50 minutes The patient was continuously monitored during the procedure by the interventional radiology nurse under my direct supervision. CONTRAST:  30 mL-administered into the collecting system(s) FLUOROSCOPY TIME:  Fluoroscopy Time: 23 minutes 6 seconds (120 mGy). COMPLICATIONS: None immediate. PROCEDURE: Informed consent was obtained for right percutaneous nephrostomy tube. All questions were addressed. A timeout was performed prior to the initiation of the procedure. Patient was placed prone on the interventional table. Unfortunately, the patient lost IV access at this time. The patient had to be taken off the table. Patient has poor venous access. A patent superficial vein in the left forearm was identified with ultrasound. Ultrasound image was taken and saved for documentation tourniquet was placed. The  skin was prepped with chlorhexidine and a 20 gauge peripheral IV was placed in the superficial vein using ultrasound guidance. IV flushed and was secured to the skin. Patient was placed back on the table in a prone position. The right flank was prepped and draped in a sterile fashion. Maximal barrier sterile technique was utilized including caps, mask, sterile gowns, sterile gloves, sterile drape, hand hygiene and skin antiseptic. 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 Right kidney was identified with ultrasound but difficult to access the mid or lower pole with ultrasound due to the iliac crest. The skin was anesthetized with 1% lidocaine. 21 gauge needle directed into the upper pole collecting system with ultrasound and clear urine was draining. Wire was advanced into the collecting system and Accustick dilator set was placed with difficulty. A stiff Amplatz wire was placed and a 10 [place] drain was advanced over the wire into the collecting system. However, the catheter would not coil into the renal pelvis due to the small size of the pelvis and the existing ureter stent. The catheter was removed over wire in order to improve positioning. Unfortunately, the retention suture on the drain wrapped around the wire. As the retention suture was cut and released, the suture apparently pulled out the wire and access was lost into the right kidney. Small amount of contrast had been injected into  the collecting system during placement and there was still retained contrast within the mid and lower calices. These calices were targeted with fluoroscopic guidance using 22 gauge needles. Initially a mid/upper pole calyx was cannulated and contrast injection confirmed placement in the collecting system. However, it was very difficult to advance a wire into the renal pelvis. Therefore access was attempted from a dilated lower pole calyx. Again, the needle was advanced into the collecting system but  wire could not be advanced into the pelvis. Eventually, wire was advanced through the mid/upper pole calyx into the renal pelvis. Accustick dilator set was placed and eventually a 10 French Renee Pain catheter was placed and reconstituted within the renal pelvis. Contrast injection confirmed placement in the renal pelvis. The catheter was flushed and attached to a gravity bag. Catheter was sutured to the skin. Fluoroscopic and ultrasound images were taken and saved for this procedure. FINDINGS: Mild hydronephrosis in the right kidney. Percutaneous access was obtained from mid/upper pole calyx. Filling defects within the collecting system at end of the procedure are compatible with blood clots. Patient has a ureter stent that extends through the collecting system into the upper pole parenchyma. IMPRESSION: Successful placement of a right percutaneous nephrostomy tube with ultrasound and fluoroscopic guidance. Blood products within the collecting system related to catheter placement. Plan for routine flushing of the catheter until the urine clears. Electronically Signed   By: Richarda Overlie M.D.   On: 04/01/2018 07:49      Subjective: Poor historian secondary to baseline chronic cognitive impairment.  Specifically denies pain.  Does not say much.  Does not follow except some simple instructions.  As per RN, no acute issues noted.  Discharge Exam:  Vitals:   04/06/18 2147 04/07/18 0400 04/07/18 0456 04/07/18 1245  BP: 120/79  (!) 149/100 127/78  Pulse: 95  99 79  Resp: (!) 25  (!) 24   Temp: 97.9 F (36.6 C)  98.4 F (36.9 C) 98 F (36.7 C)  TempSrc: Axillary  Axillary Oral  SpO2: 96%  100% 100%  Weight:  63 kg    Height:        General: Pleasant young male, small built, thinly nourished, frail and chronically ill looking lying comfortably propped up in bed without distress. Cardiovascular: S1 & S2 heard, RRR, S1/S2 +. No murmurs, rubs, gallops or clicks. No JVD or pedal edema.  Telemetry  personally reviewed: Sinus rhythm. Respiratory: Poor inspiratory effort.  Slightly diminished breath sounds in the bases otherwise clear to auscultation.  No wheezing, rhonchi or crackles appreciated.  No increased work of breathing. Abdominal:  Non distended, non tender & soft. No organomegaly or masses appreciated. Normal bowel sounds heard. GU: Right percutaneous nephrostomy tube intact with minimally bloodstained urine. CNS: Alert and oriented only to self.  Significant cognitive impairment. 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    The results of significant diagnostics from this hospitalization (including imaging, microbiology, ancillary and laboratory) are listed below for reference.     Microbiology: Recent Results (from the past 240 hour(s))  Culture, blood (routine x 2)     Status: None   Collection Time: 04/02/18  7:55 AM  Result Value Ref Range Status   Specimen Description BLOOD RIGHT HAND  Final   Special Requests   Final    BOTTLES DRAWN AEROBIC AND ANAEROBIC Blood Culture adequate volume   Culture   Final    NO GROWTH 5 DAYS Performed at [facility], [address] [place], [place] [address]    Report Status 04/07/2018 FINAL  Final  Culture, blood (routine x 2)     Status: Abnormal   Collection Time: 04/02/18  8:07 AM  Result Value Ref Range Status   Specimen Description BLOOD LEFT HAND  Final   Special Requests   Final    BOTTLES DRAWN AEROBIC AND ANAEROBIC Blood Culture adequate volume   Culture  Setup Time   Final    GRAM POSITIVE COCCI IN CLUSTERS AEROBIC BOTTLE ONLY CRITICAL RESULT CALLED TO, READ BACK BY AND VERIFIED WITH: PHARMD F WILSON [id number] MLM    Culture (A)  Final    STAPHYLOCOCCUS SPECIES (COAGULASE NEGATIVE) THE SIGNIFICANCE OF ISOLATING THIS ORGANISM FROM A SINGLE SET OF BLOOD CULTURES WHEN MULTIPLE SETS ARE DRAWN IS UNCERTAIN. PLEASE NOTIFY THE MICROBIOLOGY DEPARTMENT WITHIN ONE WEEK IF  SPECIATION AND SENSITIVITIES ARE REQUIRED. Performed at [facility], [address] [place], [place] [address]    Report Status 04/05/2018 FINAL  Final  Blood Culture ID Panel (Reflexed)     Status: Abnormal   Collection Time: 04/02/18  8:07 AM  Result Value Ref Range Status   Enterococcus species NOT DETECTED NOT DETECTED Final   Listeria monocytogenes NOT DETECTED NOT DETECTED Final   Staphylococcus species DETECTED (A) NOT DETECTED Final    Comment: Methicillin (oxacillin) resistant coagulase negative staphylococcus. Possible blood culture contaminant (unless isolated from more than one blood culture draw or clinical case suggests pathogenicity). No antibiotic treatment is indicated for blood  culture contaminants. CRITICAL RESULT CALLED TO, READ BACK BY AND VERIFIED WITH: PHARMD F WILSON [id number] MLM    Staphylococcus aureus NOT DETECTED NOT DETECTED Final   Methicillin resistance DETECTED (A) NOT DETECTED Final    Comment: CRITICAL RESULT CALLED TO, READ BACK BY AND VERIFIED WITH: PHARMD F WILSON [id number] MLM    Streptococcus species NOT DETECTED NOT DETECTED Final   Streptococcus agalactiae NOT DETECTED NOT DETECTED Final   Streptococcus pneumoniae NOT DETECTED NOT DETECTED Final   Streptococcus pyogenes NOT DETECTED NOT DETECTED Final   Acinetobacter baumannii NOT DETECTED NOT DETECTED Final   Enterobacteriaceae species NOT DETECTED NOT DETECTED Final   Enterobacter cloacae complex NOT DETECTED NOT DETECTED Final   Escherichia coli NOT DETECTED NOT DETECTED Final   Klebsiella oxytoca NOT DETECTED NOT DETECTED Final   Klebsiella pneumoniae NOT DETECTED NOT DETECTED Final   Proteus species NOT DETECTED NOT DETECTED Final   Serratia marcescens NOT DETECTED NOT DETECTED Final   Haemophilus influenzae NOT DETECTED NOT DETECTED Final   Neisseria meningitidis NOT DETECTED NOT DETECTED Final   Pseudomonas aeruginosa NOT DETECTED NOT DETECTED Final   Candida albicans  NOT DETECTED NOT DETECTED Final   Candida glabrata NOT DETECTED NOT DETECTED Final   Candida krusei NOT DETECTED NOT DETECTED Final   Candida parapsilosis NOT DETECTED NOT DETECTED Final   Candida tropicalis NOT DETECTED NOT DETECTED Final    Comment: Performed at [facility], [address], [place], [place] [address]  Culture, Urine     Status: None   Collection Time: 04/02/18  1:32 PM  Result Value Ref Range Status   Specimen Description URINE, RANDOM  Final   Special Requests NONE  Final   Culture   Final    NO GROWTH Performed at [facility], [address] [place], [place] [address]    Report Status 04/03/2018 FINAL  Final  Labs: CBC: Recent Labs  Lab 03/31/18 1616  04/02/18 0432 04/03/18 0431 04/03/18 1614 04/04/18 0405 04/05/18 0551 04/06/18 0505  WBC 12.1*   < > 12.8* 9.8  --  8.9 10.0 9.8  NEUTROABS 6.9  --   --   --   --   --   --  4.0  HGB 7.7*   < > 8.8* 8.1* 8.5* 8.6* 8.7* 8.5*  HCT 25.8*   < > 30.1* 28.1* 28.9* 29.9* 30.4* 28.6*  MCV 88.1   < > 89.3 90.1  --  90.6 89.7 88.5  PLT 257   < > 225 157  --  141* 130* 122*   < > = values in this interval not displayed.   Basic Metabolic Panel: Recent Labs  Lab 04/02/18 0432 04/03/18 0431 04/03/18 1614 04/04/18 0405 04/05/18 0551 04/06/18 0505  NA 148* 150* 146* 147* 147* 142  K 4.0 3.4* 3.7 3.4* 3.6 3.3*  CL 113* 117* 114* 113* 115* 111  CO2 26 28 28 27 29 26   GLUCOSE 98 93 116* 132* 114* 142*  BUN 28* 21* 19 18 18 20   CREATININE 0.83 0.71 0.74 0.71 0.68 0.75  CALCIUM 7.6* 7.6* 7.7* 7.9* 7.7* 7.4*  MG 2.1 2.0  --  2.0 2.0 2.0   Liver Function Tests: Recent Labs  Lab 04/02/18 0432 04/06/18 1216  AST 17 19  ALT 15 16  ALKPHOS 103 84  BILITOT 0.3 0.3  PROT 9.5* 9.3*  ALBUMIN 1.6* 1.5*    CBG: Recent Labs  Lab 04/06/18 1122 04/06/18 1710 04/06/18 2146 04/07/18 0753 04/07/18 1143  GLUCAP 141* 109* 137* 131* 156*   Urinalysis    Component Value Date/Time    COLORURINE RED (A) 04/02/2018 1332   APPEARANCEUR TURBID (A) 04/02/2018 1332   LABSPEC  04/02/2018 1332    TEST NOT REPORTED DUE TO COLOR INTERFERENCE OF URINE PIGMENT   PHURINE  04/02/2018 1332    TEST NOT REPORTED DUE TO COLOR INTERFERENCE OF URINE PIGMENT   GLUCOSEU (A) 04/02/2018 1332    TEST NOT REPORTED DUE TO COLOR INTERFERENCE OF URINE PIGMENT   HGBUR (A) 04/02/2018 1332    TEST NOT REPORTED DUE TO COLOR INTERFERENCE OF URINE PIGMENT   BILIRUBINUR (A) 04/02/2018 1332    TEST NOT REPORTED DUE TO COLOR INTERFERENCE OF URINE PIGMENT   KETONESUR (A) 04/02/2018 1332    TEST NOT REPORTED DUE TO COLOR INTERFERENCE OF URINE PIGMENT   PROTEINUR (A) 04/02/2018 1332    TEST NOT REPORTED DUE TO COLOR INTERFERENCE OF URINE PIGMENT   UROBILINOGEN 1.0 11/20/2009 1327   NITRITE (A) 04/02/2018 1332    TEST NOT REPORTED DUE TO COLOR INTERFERENCE OF URINE PIGMENT   LEUKOCYTESUR (A) 04/02/2018 1332    TEST NOT REPORTED DUE TO COLOR INTERFERENCE OF URINE PIGMENT    I was unable to reach patient's family/sister via phone and left her a voicemail message.  Time coordinating discharge: 60 minutes  SIGNED:  Marcellus ScottAnand Siona Coulston, MD, FACP, [facility]ka[facility]. [facility] Pager [phone]  If 7PM-7AM, please contact night-coverage [email or web address] Password [email or web address] 04/07/2018, 1:36 PM

## 2018-04-07 NOTE — Progress Notes (Signed)
[facility]

## 2018-04-07 NOTE — Discharge Instructions (Signed)

## 2018-04-22 ENCOUNTER — Other Ambulatory Visit: Payer: Self-pay | Admitting: Urology

## 2018-04-23 ENCOUNTER — Encounter: Attending: Internal Medicine | Primary: Internal Medicine

## 2018-04-23 NOTE — Telephone Encounter (Signed)
S: pt calling [facility] d/t leg problem  B: ongoing for about a week  A: pt states he had an infection to right leg and was given antibiotics. Pt completed and infection improved but pt states he still has a quarter sized lump under skin. Denies pain, redness, fever. Pt requesting appt next week as he has to set up transportation  R: dispositoin home care- offered pt appt today but he was unable to accept d/t no transportation. appt scheduled 04/27/18 at 3pm with Dr Zannie Cove. Pt verbalized understanding. Insurance verified.           Reason for Disposition  ??? Wound doesn't sound infected, or only mild redness    Protocols used: WOUND INFECTION-ADULT-[place]

## 2018-04-27 ENCOUNTER — Encounter: Attending: Adult Medicine | Primary: Internal Medicine

## 2018-04-30 ENCOUNTER — Ambulatory Visit: Admit: 2018-04-30 | Discharge: 2018-04-30 | Payer: MEDICAID | Attending: Adult Medicine | Primary: Internal Medicine

## 2018-04-30 DIAGNOSIS — Z09 Encounter for follow-up examination after completed treatment for conditions other than malignant neoplasm: Secondary | ICD-10-CM

## 2018-04-30 NOTE — Progress Notes (Signed)
[facility]  [place] [address]  Dept: [phone]  Dept Fax: [phone]  Loc: [phone]   Subjective   Mason Garza is a 52 y.o. year old who presents to the office with the following complaint(s): lump in leg, hx of infections  Subjective    HPI:  Lump in leg -   - started weeks ago and got better after pt was placed on two different abx (thinks could be doxycycline and augmentin) from Urgent care facility.  - hx of MRSA   - is improving.  - abx made better, no exacerbating factors.  - tx with bactrim 02/11/18 from ER which resolved that infection then.  - now main concern is resolution of cellulitis as he can stilll feel a small lump  - no further redness, lump greatly reduced in size, no systemic symptos.  - high risk - IVDU, DMT2,   - living at [facility]   Constitutional: Negative for appetite change, chills and fever.   Gastrointestinal: Negative for diarrhea, nausea and vomiting.   Skin: Negative for color change and wound.      Allergies   Allergen Reactions   . Penicillins Hives     Current Outpatient Medications on File Prior to Visit   Medication Sig Dispense Refill   . naproxen (NAPROSYN) 500 MG tablet Take 1 tablet by mouth 2 times daily as needed for Pain 14 tablet 0   . glucose monitoring kit (FREESTYLE) monitoring kit 1 kit by Does not apply route daily Dispense as covered by insurance 1 kit 0   . blood glucose monitor strips Test 3 times a day & as needed for symptoms of irregular blood glucose.dispense as covered by insurance 100 strip 5   . Lancets MISC Dispense lancet device DISPENSE AS COVERED BY INSURANCE 1 each 3   . Lancets MISC 1 each by Does not apply route 3 times daily (before meals) DISPENSE AS COVERED BY INSURANCE 100 each 11   . Alcohol Swabs (ALCOHOL PADS) 70 % PADS 1 Package by Does not apply route 3 times daily (before meals) 100 each 5     No current facility-administered  medications on file prior to visit.       Patient Active Problem List   Diagnosis   . Hepatitis C, Ab + no rna testing   . IVDU (intravenous drug user)   . Type 2 diabetes mellitus with diabetic neuropathy ([place])   . Tobacco use   . Neuropathy   . Depression   . Insomnia   . Not immune to hepatitis B virus   . Chronic hepatitis C ([place])   . Generalized weakness   . Nausea   . Heart palpitations   . Cellulitis and abscess of right lower extremity   . Severe opioid use disorder ([place])   . Intravenous drug abuse ([place])   . Polysubstance abuse ([place])      Social History     Tobacco Use   . Smoking status: Current Every Day Smoker     Packs/day: 1.00     Years: 30.00     Pack years: 30.00     Types: Cigarettes   . Smokeless tobacco: Never Used   . Tobacco comment: nicotene patch requested   Substance Use Topics   . Alcohol use: No     Alcohol/week: 0.0 standard drinks     Comment: 6 pack  per yr now; former 6-12 pk per day for 10 yrs          Objective   Objective   BP 106/74 (Site: Right Upper Arm, Position: Sitting, Cuff Size: Large Adult)   Pulse 74   Temp 98.2 F (36.8 C)   Resp 18   Ht '5\' 11"'$  (1.803 m)   Wt 178 lb 4.8 oz (80.9 kg)   SpO2 98%   BMI 24.87 kg/m   Physical Exam   Constitutional: He appears well-developed and well-nourished. No distress.   Eyes: Conjunctivae are normal.   Pulmonary/Chest: Effort normal.   Neurological: He is alert.   Skin: Skin is warm and dry. Capillary refill takes less than 2 seconds. No rash noted. No erythema. No pallor.   Some hair loss overlying the RLE where pt points out previous lump was. No erythema, no drainage, no streaking,    Psychiatric: He has a normal mood and affect. His behavior is normal.   Vitals reviewed.        Assessment/Plan   1. Follow-up exam after treatment    2. History of MRSA infection     1. ABSSI has appeared to resolve. Advised pt to seek fu care if lumps return, redness returns, develops systemic symptoms.    Follow-up: Return if symptoms worsen or  fail to improve.    Dan Maker, MD  04/30/2018  7:34 AM

## 2018-05-12 NOTE — Telephone Encounter (Signed)
Patient with recent opiate overdose and active substance abuse with positive tox screen since his last visit with me in may.  This is in violation of controlled medication contract previously done with patient.  I did not agree to increase his gabapentin and will not be refilling further.  He will need to follow up with addiction medicine and I can refer him to pain management if needed - please let me know.

## 2018-05-12 NOTE — Telephone Encounter (Signed)
Lm to call

## 2018-05-12 NOTE — Telephone Encounter (Signed)
Message released to nurse Evon from the [facility] as written per pt's request. Nurse states no further questions or concerns.     All questions from office were addressed or relayed to patient from encounter Yes

## 2018-05-12 NOTE — Telephone Encounter (Signed)
Name of Caller: Rodrigo Ran    Relationship to Patient: patient    Contact phone number:  [phone] ext [phone] (This is an [employer])    Medication Name: gabapentin (NEURONTIN) 300 MG capsule     PT is requesting a higher dosage. Pt says he was told as last OV that he would be getting an increased dosage.     Ordering Physician: Dr. Olivia Mackie    Pharmacy Name: [id number] - [place], [place] - [address] - [phone] [address]on [address]ins[address] [phone]     Date of last office visit: 04/30/18    Date of next office visit: 08/04/18    Date of Last Refill: (See Medication Tab): 01/01/18    Updated/Validated Preferred Pharmacy: Yes    Patient instructed to contact the pharmacy prior to picking up the medication: Yes

## 2018-05-13 ENCOUNTER — Telehealth

## 2018-05-13 NOTE — Telephone Encounter (Signed)
Name of Caller: Mason Garza    Contact phone number: [phone] Christell Constant number can relay message     Relationship to Patient: patient    Provider: Dr Olivia Mackie    Practice:  [facility]     Chief Complaint/Reason for Call: patient would like a referral for pain management patient wanted to know if he needs to come in for blood work as well     Best time of day caller can be reached: any       Patient advised that office/PCP has 24-48 business hours to return their call: Yes

## 2018-05-13 NOTE — Telephone Encounter (Signed)
If this a new area of skin concern? He may need to be scheduled for an appointment to re-evaluate.  He was seen 9/13 by a provider and area appeared to be resolved.  The CBC can sometimes reflect if an infection is ongoing.  That is part of his routine lab orders.

## 2018-05-13 NOTE — Telephone Encounter (Signed)
Please triage and clarify request.

## 2018-05-13 NOTE — Telephone Encounter (Signed)
Rhonda aware

## 2018-05-13 NOTE — Telephone Encounter (Signed)
Name of Caller: Taran    Contact phone number: verified    Relationship to Patient: sefl    Provider: [facility]    Practice:  [facility] Complaint/Reason for Call: pt is requesting an order for blood work to test for an infection. please advise    Best time of day caller can be reached: asap       Patient advised that office/PCP has 24-48 business hours to return their call: Yes

## 2018-05-13 NOTE — Telephone Encounter (Signed)
Pt girlfriend aware

## 2018-05-13 NOTE — Telephone Encounter (Signed)
Pt is in rehab, spoke with girlfirend rhonda, she reports pt is still having issues with wound/abcess of leg and wants labs for that infection? Advised cbc would show white counts and could indicate possible infections, please advise

## 2018-05-13 NOTE — Telephone Encounter (Signed)
Referral placed.  He has active fasting lab orders from may to complete

## 2018-05-14 ENCOUNTER — Encounter

## 2018-05-14 ENCOUNTER — Encounter: Payer: MEDICAID | Primary: Internal Medicine

## 2018-05-14 LAB — COMPREHENSIVE METABOLIC PANEL
ALT: 47 U/L (ref 13–69)
AST: 36 U/L (ref 15–46)
Albumin,Serum: 4.4 g/dL (ref 3.5–5.0)
Alkaline Phosphatase: 97 U/L (ref 38–126)
Anion Gap: 11 NA
BUN: 14 mg/dL (ref 7–20)
CO2: 23 mmol/L (ref 22–30)
Calcium: 9.5 mg/dL (ref 8.4–10.4)
Chloride: 106 mmol/L (ref 98–107)
Creatinine: 0.71 mg/dL (ref 0.52–1.25)
EGFR IF NonAfrican American: >60 mL/min (ref >60)
Glucose: 163 mg/dL — ABNORMAL HIGH (ref 70–100)
Potassium: 4.4 mmol/L (ref 3.5–5.1)
Sodium: 141 mmol/L (ref 135–145)
Total Bilirubin: 0.7 mg/dL (ref 0.2–1.3)
Total Protein: 8.1 g/dL (ref 6.3–8.2)
eGFR African American: >60 mL/min (ref >60)

## 2018-05-14 LAB — CBC WITH AUTO DIFFERENTIAL
Absolute Baso #: 0 10*3/uL (ref 0.0–0.2)
Absolute Eos #: 0 10*3/uL (ref 0.0–0.5)
Absolute Lymph #: 1.8 10*3/uL (ref 1.0–4.3)
Absolute Mono #: 0.5 10*3/uL (ref 0.0–0.8)
Absolute Neut #: 2.7 10*3/uL (ref 1.8–7.0)
Basophils: 0.7 % (ref 0.0–2.0)
Eosinophils: 0.1 % — ABNORMAL LOW (ref 1.0–6.0)
Granulocytes %: 52.7 % (ref 40.0–80.0)
Hematocrit: 42.5 % (ref 40.0–52.0)
Hemoglobin: 14.1 g/dL (ref 13.0–18.0)
Lymphocyte %: 36.6 % (ref 20.0–40.0)
MCH: 28.9 pg (ref 26.0–34.0)
MCHC: 33.3 % (ref 32.0–36.0)
MCV: 86.7 fL (ref 80.0–98.0)
MPV: 7.2 fL — ABNORMAL LOW (ref 7.4–10.4)
Monocytes: 9.9 % (ref 2.0–10.0)
Platelets: 176 10*3/uL (ref 140–440)
RBC: 4.9 10*6/uL (ref 4.40–5.90)
RDW: 15.7 % — ABNORMAL HIGH (ref 11.5–14.5)
WBC: 5.1 10*3/uL (ref 3.6–10.7)

## 2018-05-14 LAB — VITAMIN B12: Vitamin B-12: 498 pg/mL (ref 239–931)

## 2018-05-14 LAB — VITAMIN D 25 HYDROXY: Vit D, 25-Hydroxy: 40 ng/mL (ref 30–100)

## 2018-05-14 LAB — TSH: TSH: 1.13 u[IU]/mL (ref 0.465–4.680)

## 2018-05-14 LAB — LIPID PANEL
Chol/HDL Ratio: 4 NA
Cholesterol: 114 mg/dL (ref <200)
HDL: 29 mg/dL — ABNORMAL LOW (ref 40–60)
LDL Cholesterol: 49 mg/dL (ref <100)
Triglycerides: 180 mg/dL — AB (ref <150)

## 2018-05-14 LAB — HEPATITIS B SURFACE ANTIBODY: Hep B S Ab: 70.5 m[IU]/mL

## 2018-05-17 LAB — HEPATITIS A ANTIBODY, TOTAL: Hep A Total Ab: NEGATIVE NA

## 2018-05-20 NOTE — Telephone Encounter (Signed)
-----   Message from Ellene RouteNaomi Tyree, MD sent at 05/17/2018  7:59 AM EDT -----  Not immune to hepatitis A.  Immune to hepatitis B.  Vitamin B12 level within normal range.  Thyroid function within normal range.  Vitamin D level within normal range.  No signs of infection based off of blood counts alone.  No anemia.  Triglycerides are slightly elevated.  LDL in normal range.  Normal kidney and liver function.  Blood sugar is elevated consistent with diabetes.  Please clarify if patient is taking his metformin and Januvia.  It no longer appears on his medication list.  Lab Results       Component                Value               Date                       LABA1C                   9.0                 12/18/2017            Lab Results       Component                Value               Date                       EAG                      160                 10/06/2017

## 2018-05-20 NOTE — Telephone Encounter (Signed)
Unable to leave vm, vm full

## 2018-05-21 NOTE — Telephone Encounter (Signed)
Unable to leave message

## 2018-05-24 NOTE — Telephone Encounter (Signed)
Just got a loud buzzing noise. Letter sent

## 2018-05-26 ENCOUNTER — Encounter (HOSPITAL_COMMUNITY): Payer: Self-pay | Admitting: *Deleted

## 2018-05-26 NOTE — Progress Notes (Addendum)
Preop instructions for:  Rodrigo Ran                        Date of Birth : 06/30/1966                            Date of Procedure:05/31/2018        Doctor: Jeannett Senior Dahlstedt  Time to arrive at [facility]:0700am  Report to: Admitting  Procedure: right percutaneous nephrolithotomy  Any procedure time changes, MD office will notify you!   Do not eat or drink past midnight the night before your procedure.(To include any tube feedings-must be discontinued) Reminder:Follow bowel prep instructions per MD office!   Take these morning medications only with sips of water.(or give through gastrostomy or feeding tube). Hydrocodone, Depakote, Metoprolol, Nebulizer as usual, Amlodipine, Baclofen, Clindamycin, Clonazepam, Desmopressin nasal spray, Seroquel, Zoloft, Take 1/2 of evening dose of Levemir Insulin nite before surgery.  Note: No Insulin or Diabetic meds should be given or taken the morning of the procedure!   Facility contact:   [facility]                Phone:[phone]                  Health Care POA: sisterShahab Polhamus- [phone]  Transportation contact phone#: [facility]   Please send day of procedure:current med list and meds last taken that day, confirm nothing by mouth status from what time, Patient Demographic info( to include DNR status, problem list, allergies)   RN contact name/phone#:   [facility]                           and Fax #[phone]  [employer] card and picture ID Leave all jewelry and other valuables at place where living( no metal or rings to be worn) No contact lens  Men-no colognes,lotions  Any questions day of procedure,call Short Stay [phone]    Sent from :[facility]                   [phone]                   Fax:[phone]  Sent by :Cyndia Diver RN

## 2018-05-27 NOTE — Progress Notes (Signed)
Sister, Jorene Minors will be here for surgery and will sign consent form.  Sister to bring poa papers.  Nurse spoke with her and verified.

## 2018-05-28 NOTE — Progress Notes (Signed)
Called and Left message for nurse to call to verify she had received preop instructions for surgery on 05/31/2018.  Nurse is to call back

## 2018-05-28 NOTE — Progress Notes (Signed)
Called and spoke with nurse at [facility] and they have received preop instructions.

## 2018-05-30 ENCOUNTER — Inpatient Hospital Stay: Admit: 2018-05-30 | Discharge: 2018-05-30 | Disposition: A | Discharge status: Home / Self Care

## 2018-05-30 DIAGNOSIS — T50904A Poisoning by unspecified drugs, medicaments and biological substances, undetermined, initial encounter: Secondary | ICD-10-CM

## 2018-05-30 MED ORDER — TETANUS-DIPHTH-ACELL PERTUSSIS 5-2.5-18.5 LF-MCG/0.5 IM SUSP
Freq: Once | INTRAMUSCULAR | Status: AC
Start: 2018-05-30 — End: 2018-05-30
  Administered 2018-05-30: 21:00:00 0.5 mL via INTRAMUSCULAR

## 2018-05-30 MED ORDER — BACITRACIN 500 UNIT/GM EX OINT
500 UNIT/GM | Freq: Once | CUTANEOUS | Status: AC
Start: 2018-05-30 — End: 2018-05-30
  Administered 2018-05-30: 21:00:00 via TOPICAL

## 2018-05-30 MED FILL — BACITRACIN 500 UNIT/GM EX OINT: 500 UNIT/GM | CUTANEOUS | Qty: 1

## 2018-05-30 MED FILL — BOOSTRIX 5-2.5-18.5 LF-MCG/0.5 IM SUSP: 5-2.5-18.5 LF-MCG/0.5 | INTRAMUSCULAR | Qty: 0.5

## 2018-05-30 NOTE — ED Notes (Signed)
Wound care performed on pt      Loistine Simas, RN  05/30/18 1715

## 2018-05-30 NOTE — ED Notes (Signed)
Bed: 04  Expected date:   Expected time:   Means of arrival:   Comments:  EMS: when clean OD, awake now     Lendon Collar, RN  05/30/18 1646

## 2018-05-30 NOTE — Other (Unsigned)
Patient Acct Nbr: [id number]   Primary AUTH/CERT:   Primary Insurance Company Name: [employer]  Primary Insurance Plan name: [facility]  Primary [facility] Group [facility]: [facility]  Primary Insurance Plan Type: Health  Primary Insurance Policy Number: [id number]

## 2018-05-30 NOTE — ED Notes (Signed)
Pt left with a quick steady gait      Loistine Simas, RN  05/30/18 [phone]

## 2018-05-30 NOTE — ED Provider Notes (Signed)
[facility] EMERGENCY DEPT  eMERGENCY dEPARTMENT eNCOUnter      Pt Name: Mason Garza  MRN: [id number]  [place] 08-18-1966  Date of evaluation: 05/30/2018  Provider: Marden Noble, PA-C     CHIEF COMPLAINT       Chief Complaint   Patient presents with   ??? Drug Overdose     found slumped over in driveway with snoring respirations, given 36m intranasal narcan and pt became responsive - pt c/o left sided knee and elbow pain      HISTORY OF PRESENT ILLNESS   (Location/Symptom, Timing/Onset, Context/Setting, Quality,Duration, Modifying Factors, Severity) Note limiting factors.   HPI  I have evaluated this patient on my own, per my scope of practice with an attending available for consultation.   Mason SCHOENBERGERis a 52y.o. male who presents to the emergency department for evaluation after an apparent overdose that occurred today.  He was brought here by squad who are to gave him 4 mg of Narcan.  Patient is a very poor historian.  No other family members or friends are at the bedside.  No report from EMS.  Patient unsure what he took today.  He states he only took 2 Tylenol.  Patient denies any suicidal or homicidal ideation.  He is complaining of left elbow and left knee pain which she has abrasions to both of these areas and Patient unsure when his last tetanus was.  He describes his pain as a 5 out of 10 pain which she is unable to describe.  Patient has no other complaints at this time.  No aggravating or alleviating factors.    Nursing notes were reviewed.       REVIEW OF SYSTEMS    (2+ for level 4; 10+ for level 5)   Review of Systems   Constitutional: Negative for chills, fatigue and fever.   HENT: Negative.    Eyes: Negative for pain, discharge and redness.   Respiratory: Negative.    Cardiovascular: Negative.    Gastrointestinal: Negative for abdominal distention, abdominal pain, blood in stool, constipation, diarrhea, nausea and vomiting.   Genitourinary: Negative for dysuria, flank pain, frequency, hematuria  and urgency.   Musculoskeletal: Negative for arthralgias, back pain, gait problem, joint swelling, myalgias and neck pain.        Left knee and elbow pain.   Skin: Positive for wound. Negative for rash.   Allergic/Immunologic: Negative for environmental allergies and food allergies.   Neurological: Negative for dizziness, weakness, numbness and headaches.   Hematological: Does not bruise/bleed easily.   Psychiatric/Behavioral: Negative for agitation, confusion, hallucinations, self-injury and suicidal ideas. The patient is not nervous/anxious.        PAST MEDICAL HISTORY     Past Medical History:   Diagnosis Date   ??? Chronic hepatitis C ([place] 11/22/2015    active IVDU   ??? H/O echocardiogram 05/12/11    EF62%, mild LAE   ??? MRSA (methicillin resistant Staphylococcus aureus) infection 10/05/2017   ??? Neuropathy    ??? 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 Psychiatric problem    ??? Type II or unspecified type diabetes mellitus without mention of complication, not stated as uncontrolled        SURGICAL HISTORY       Past Surgical History:   Procedure Laterality Date   ??? OTHER SURGICAL HISTORY Bilateral 10/06/2017    Irrigation and Debridement of Abscesses arms and scalp  CURRENT MEDICATIONS       Discharge Medication List as of 05/30/2018  7:30 PM      CONTINUE these medications which have NOT CHANGED    Details   naproxen (NAPROSYN) 500 MG tablet Take 1 tablet by mouth 2 times daily as needed for Pain, Disp-14 tablet, R-0Print      glucose monitoring kit (FREESTYLE) monitoring kit DAILY Starting Fri 12/18/2017, Disp-1 kit, R-0, NormalDispense as covered by insurance      blood glucose monitor strips Test 3 times a day & as needed for symptoms of irregular blood glucose.dispense as covered by insurance, Disp-100 strip, R-5, Normal      !! Lancets MISC Disp-1 each, R-3, NormalDispense lancet device DISPENSE AS COVERED BY INSURANCE      !! Lancets MISC 3 TIMES DAILY BEFORE MEALS Starting Fri 12/18/2017,  Disp-100 each, R-11, NormalDISPENSE AS COVERED BY INSURANCE      Alcohol Swabs (ALCOHOL PADS) 70 % PADS 3 TIMES DAILY BEFORE MEALS Starting Fri 12/18/2017, Disp-100 each, R-5, Normal       !! - Potential duplicate medications found. Please discuss with provider.          ALLERGIES     Penicillins    FAMILY HISTORY       Family History   Problem Relation Age of Onset   ??? Lung Cancer Mother 52   ??? Colon Cancer Father 3   ??? Cancer Brother 49        renal cell cancer        SOCIAL HISTORY       Social History     Socioeconomic History   ??? Marital status: Divorced     Spouse name: Not on file   ??? Number of children: Not on file   ??? Years of education: Not on file   ??? Highest education level: Not on file   Occupational History   ??? Not on file   Social Needs   ??? Financial resource strain: Not on file   ??? Food insecurity:     Worry: Not on file     Inability: Not on file   ??? Transportation needs:     Medical: Not on file     Non-medical: Not on file   Tobacco Use   ??? Smoking status: Current Every Day Smoker     Packs/day: 1.00     Years: 30.00     Pack years: 30.00     Types: Cigarettes   ??? Smokeless tobacco: Never Used   ??? Tobacco comment: nicotene patch requested   Substance and Sexual Activity   ??? Alcohol use: No     Alcohol/week: 0.0 standard drinks     Comment: 6 pack per yr now; former 6-12 pk per day for 10 yrs   ??? Drug use: Yes     Frequency: 10.0 times per week     Types: Opiates , IV, Methamphetamines     Comment: heroin IVDU daily   ??? Sexual activity: Yes     Partners: Female   Lifestyle   ??? Physical activity:     Days per week: Not on file     Minutes per session: Not on file   ??? Stress: Not on file   Relationships   ??? Social connections:     Talks on phone: Not on file     Gets together: Not on file     Attends religious service: Not on file     Active  member of club or organization: Not on file     Attends meetings of clubs or organizations: Not on file     Relationship status: Not on file   ??? Intimate partner  violence:     Fear of current or ex partner: Not on file     Emotionally abused: Not on file     Physically abused: Not on file     Forced sexual activity: Not on file   Other Topics Concern   ??? Not on file   Social History Narrative    In [place] house 12/2017    Looking for a place to live       SCREENINGS           PHYSICAL EXAM    (up to 7 forlevel 4, 8 or more for level 5)     ED Triage Vitals [05/30/18 1647]   BP Temp Temp Source Pulse Resp SpO2 Height Weight   120/76 97.6 ??F (36.4 ??C) Oral 86 16 97 % '5\' 11"'$  (1.803 m) 180 lb (81.6 kg)       Physical Exam  General: Patient appears to be well-developed and well-nourished.  Patient does not appear to be in acute distress.  Vital signs noted and unremarkable.  HEENT: Head appears normocephalic/atraumatic.  External ears and nose normal.  Oropharynx clear and moist.  Neck is supple with no cervical lymphadenopathy.  Pupils are round and reactive to light.  Cardiac: Regular rate and rhythm.   Pulmonary: Lungs are clear to auscultation.  No wheezes or rales heard.  Abdomen: Bowel sounds heard all 4 quadrants.  Abdomen is soft and nontender.   Extremities: Patient is moving all extremities without difficulty.  Extremities appear to be well perfused.  He has full range of motion of the left upper extremity compared to the right without difficulty as well as the left lower extremity compared to the right without difficulty.  He has no bony tenderness to palpation to the left elbow or left knee.  Skin: Warm and dry.  Superficial abrasions noted to the left elbow as well as the left knee.  No lacerations.  Neurologic: Alert and oriented ??3.    Psychiatric: Mood and behavior normal  DIAGNOSTIC RESULTS     EKG (Per Emergency Physician):   NA    Interpretation per the Radiologist below, if available at the time of thisnote:  No results found.    LABS:  Labs Reviewed - No data to display      EMERGENCY DEPARTMENT COURSE and DIFFERENTIAL DIAGNOSIS/MDM:   Vitals:    Vitals:     05/30/18 1647   BP: 120/76   Pulse: 86   Resp: 16   Temp: 97.6 ??F (36.4 ??C)   TempSrc: Oral   SpO2: 97%   Weight: 81.6 kg (180 lb)   Height: '5\' 11"'$  (1.803 m)       Medications   Tetanus-Diphth-Acell Pertussis (BOOSTRIX) injection 0.5 mL (0.5 mLs Intramuscular Given 05/30/18 1714)   bacitracin ointment ( Topical Given 05/30/18 1715)       MDM.  Patient is a 52 year old male presenting to the restaurant today after an apparent overdose.  Poor historian.  Patient received 4 mg Narcan prior to arrival.      On exam, patient is awake and alert and oriented ??3.  He reports pain to the left elbow and left knee which she describes as a 5 out of 10 pain.  He is abrasions noted to these areas.  Wound care was performed here in the emergency department.  Bacitracin was applied and Dressing was applied to the abrasions of the left elbow.tetanus is updated.      Nursing Notes were reviewed.  CC reviewed, please see HPI for patients CC for my interview  Previous Medical charts and nurses note have been reviewed.     At this time we do not feel that lab work or imaging studies are warranted.    Please see medications given in the ED above.  Tetanus was updated.  Bacitracin was applied to abrasions of the left elbow and left knee.    Based on all information given at this time, patient experienced an overdose from unknown substance.  He is abrasions noted to the left elbow and left knee.  Tetanus was updated.  Patient was monitored here in the department for several hours and had normal vital signs throughout the entire visit.  He does not appear to be septic or toxic.  He is not requiring any more Narcan.  Patient will be discharged home at this time.    At this time , theinformation and evidence obtained from the HPI and physical examination does not warrant any other testing including laboratory or imaging.    Please see below for medications given at discharge.  No medications were given at the time of discharge.  Please see  below forpatient follow up appointment.  Patient given referral to [facility].  Patient was instructed to take medication as prescribed.   Patient was also instructed to return to the emergency department if symptoms worsen, or new symptoms develop and thesesymptoms were discussed with the patient such as worsening pain, fevers, suicidal thoughts to name a few.      Comment: Please note this report has been produced using speech recognition software and may contain errors related to that system including errors in grammar, punctuation, andspelling, as well as words and phrases that may be inappropriate. ??If there are any questions or concernsplease feel free to contact the dictating provider for clarification.    REVAL:   Reevaluation the patient upon discharge and patient's resting comfortably sleeping.  Vital signs unremarkable.      CRITICAL CARE TIME   Total Critical Care time was 0 minutes, excluding separatelyreportable procedures.  There was a high probability ofclinically significant/life threatening deterioration in the patient's condition which required my urgent intervention.     CONSULTS:  None    PROCEDURES:  Unless otherwise noted below, none     Procedures  Na  FINAL IMPRESSION      1. Drug overdose, undetermined intent, initial encounter    2. Abrasion of left elbow, initial encounter    3. Abrasion of left knee, initial encounter    4. Need for diphtheria-tetanus-pertussis (Tdap) vaccine          DISPOSITION/PLAN   DISPOSITION Decision To Discharge 05/30/2018 07:29:23 PM      PATIENT REFERRED TO:  [facility] Emergency Dept  [address]  [place] [address]  [phone]    As needed, If symptoms worsen    Omelia Blackwater, MD  [address]  [place] [address]  [phone]    Schedule an appointment as soon as possible for a visit in 3 days        DISCHARGE MEDICATIONS:  Discharge Medication List as of 05/30/2018  7:30 PM             (Please note:  Portions of this note were completed  with a voice  recognition program.  Efforts were made to edit thedictations but occasionally words and phrases are mis-transcribed.)  Form v2016.J.[id number]    Marden Noble, PA-C (electronically signed)  Emergency Medicine Provider        Marden Noble, PA-C  05/30/18 1944

## 2018-05-31 ENCOUNTER — Observation Stay (HOSPITAL_COMMUNITY)
Admission: RE | Admit: 2018-05-31 | Discharge: 2018-06-02 | Disposition: A | Discharge status: Skilled Nursing Facility | Payer: Medicare Other | Source: Ambulatory Visit | Attending: Urology | Admitting: Urology

## 2018-05-31 ENCOUNTER — Encounter (HOSPITAL_COMMUNITY)
Admission: RE | Disposition: A | Discharge status: Skilled Nursing Facility | Payer: Self-pay | Source: Ambulatory Visit | Attending: Urology

## 2018-05-31 ENCOUNTER — Telehealth (HOSPITAL_COMMUNITY): Payer: Self-pay | Admitting: *Deleted

## 2018-05-31 ENCOUNTER — Inpatient Hospital Stay (HOSPITAL_COMMUNITY): Payer: Medicare Other | Admitting: Anesthesiology

## 2018-05-31 ENCOUNTER — Other Ambulatory Visit: Payer: Self-pay

## 2018-05-31 ENCOUNTER — Encounter (HOSPITAL_COMMUNITY): Payer: Self-pay

## 2018-05-31 ENCOUNTER — Observation Stay (HOSPITAL_COMMUNITY): Payer: Medicare Other

## 2018-05-31 DIAGNOSIS — E119 Type 2 diabetes mellitus without complications: Secondary | ICD-10-CM | POA: Insufficient documentation

## 2018-05-31 DIAGNOSIS — Z794 Long term (current) use of insulin: Secondary | ICD-10-CM | POA: Diagnosis not present

## 2018-05-31 DIAGNOSIS — Z936 Other artificial openings of urinary tract status: Secondary | ICD-10-CM | POA: Diagnosis not present

## 2018-05-31 DIAGNOSIS — E039 Hypothyroidism, unspecified: Secondary | ICD-10-CM | POA: Diagnosis not present

## 2018-05-31 DIAGNOSIS — N201 Calculus of ureter: Secondary | ICD-10-CM | POA: Diagnosis present

## 2018-05-31 DIAGNOSIS — Z87440 Personal history of urinary (tract) infections: Secondary | ICD-10-CM | POA: Insufficient documentation

## 2018-05-31 DIAGNOSIS — L8992 Pressure ulcer of unspecified site, stage 2: Secondary | ICD-10-CM | POA: Diagnosis present

## 2018-05-31 DIAGNOSIS — F329 Major depressive disorder, single episode, unspecified: Secondary | ICD-10-CM | POA: Diagnosis not present

## 2018-05-31 DIAGNOSIS — I10 Essential (primary) hypertension: Secondary | ICD-10-CM | POA: Insufficient documentation

## 2018-05-31 DIAGNOSIS — F411 Generalized anxiety disorder: Secondary | ICD-10-CM | POA: Diagnosis not present

## 2018-05-31 DIAGNOSIS — Z982 Presence of cerebrospinal fluid drainage device: Secondary | ICD-10-CM | POA: Insufficient documentation

## 2018-05-31 HISTORY — DX: Personal history of traumatic brain injury: Z87.820

## 2018-05-31 HISTORY — DX: Constipation, unspecified: K59.00

## 2018-05-31 HISTORY — DX: Unspecified mood (affective) disorder: F39

## 2018-05-31 HISTORY — DX: Unspecified convulsions: R56.9

## 2018-05-31 HISTORY — DX: Drug induced subacute dyskinesia: G24.01

## 2018-05-31 HISTORY — DX: Dysphagia, oropharyngeal phase: R13.12

## 2018-05-31 HISTORY — DX: Contracture, left knee: M24.562

## 2018-05-31 HISTORY — DX: Contracture, left hip: M24.552

## 2018-05-31 HISTORY — DX: Dysphagia, pharyngeal phase: R13.13

## 2018-05-31 HISTORY — DX: Contracture, left elbow: M24.522

## 2018-05-31 HISTORY — DX: Pressure ulcer of unspecified site, unspecified stage: L89.90

## 2018-05-31 HISTORY — DX: Other somatoform disorders: F45.8

## 2018-05-31 HISTORY — DX: Contracture, left hand: M24.542

## 2018-05-31 HISTORY — PX: NEPHROLITHOTOMY: SHX5134

## 2018-05-31 HISTORY — DX: Unspecified psychosis not due to a substance or known physiological condition: F29

## 2018-05-31 LAB — BASIC METABOLIC PANEL
ANION GAP: 6 (ref 5–15)
BUN: 12 mg/dL (ref 6–20)
CALCIUM: 7.9 mg/dL — AB (ref 8.9–10.3)
CO2: 27 mmol/L (ref 22–32)
Chloride: 111 mmol/L (ref 98–111)
Creatinine, Ser: 0.54 mg/dL — ABNORMAL LOW (ref 0.61–1.24)
GLUCOSE: 88 mg/dL (ref 70–99)
POTASSIUM: 3.6 mmol/L (ref 3.5–5.1)
SODIUM: 144 mmol/L (ref 135–145)

## 2018-05-31 LAB — CBC
HCT: 29.3 % — ABNORMAL LOW (ref 39.0–52.0)
HEMOGLOBIN: 8 g/dL — AB (ref 13.0–17.0)
MCH: 23.7 pg — ABNORMAL LOW (ref 26.0–34.0)
MCHC: 27.3 g/dL — ABNORMAL LOW (ref 30.0–36.0)
MCV: 86.7 fL (ref 80.0–100.0)
NRBC: 0.3 % — AB (ref 0.0–0.2)
PLATELETS: 370 10*3/uL (ref 150–400)
RBC: 3.38 MIL/uL — AB (ref 4.22–5.81)
RDW: 18.1 % — ABNORMAL HIGH (ref 11.5–15.5)
WBC: 9.9 10*3/uL (ref 4.0–10.5)

## 2018-05-31 LAB — GLUCOSE, CAPILLARY
GLUCOSE-CAPILLARY: 80 mg/dL (ref 70–99)
GLUCOSE-CAPILLARY: 94 mg/dL (ref 70–99)
GLUCOSE-CAPILLARY: 106 mg/dL — AB (ref 70–99)
GLUCOSE-CAPILLARY: 129 mg/dL — AB (ref 70–99)

## 2018-05-31 LAB — SAMPLE TO BLOOD BANK

## 2018-05-31 LAB — HEMOGLOBIN A1C
HEMOGLOBIN A1C: 5.6 % (ref 4.8–5.6)
MEAN PLASMA GLUCOSE: 114.02 mg/dL

## 2018-05-31 LAB — MRSA PCR SCREENING: MRSA BY PCR: NEGATIVE

## 2018-05-31 SURGERY — NEPHROLITHOTOMY PERCUTANEOUS
Anesthesia: General | Laterality: Right

## 2018-05-31 MED ORDER — SELENIUM SULFIDE 1 % EX LOTN
1.0000 "application " | TOPICAL_LOTION | CUTANEOUS | Status: DC
  Filled 2018-05-31: qty 207

## 2018-05-31 MED ORDER — POLYETHYLENE GLYCOL 3350 17 G PO PACK
17.0000 g | PACK | Freq: Every day | ORAL | Status: DC
  Administered 2018-05-31 – 2018-06-02 (×3): 17 g via ORAL
  Filled 2018-05-31 (×3): qty 1

## 2018-05-31 MED ORDER — FENTANYL CITRATE (PF) 100 MCG/2ML IJ SOLN
INTRAMUSCULAR | Status: DC | PRN
  Administered 2018-05-31 (×2): 50 ug via INTRAVENOUS

## 2018-05-31 MED ORDER — SUCCINYLCHOLINE CHLORIDE 200 MG/10ML IV SOSY
PREFILLED_SYRINGE | INTRAVENOUS | Status: AC
  Filled 2018-05-31: qty 10

## 2018-05-31 MED ORDER — FENTANYL CITRATE (PF) 100 MCG/2ML IJ SOLN: 25.0000 ug | INTRAMUSCULAR | Status: DC | PRN

## 2018-05-31 MED ORDER — HYDROMORPHONE HCL 1 MG/ML IJ SOLN: 0.5000 mg | INTRAMUSCULAR | Status: DC | PRN

## 2018-05-31 MED ORDER — PROPOFOL 10 MG/ML IV BOLUS
INTRAVENOUS | Status: AC
  Filled 2018-05-31: qty 20

## 2018-05-31 MED ORDER — IOHEXOL 300 MG/ML  SOLN
INTRAMUSCULAR | Status: DC | PRN
  Administered 2018-05-31: 2.5 mL

## 2018-05-31 MED ORDER — QUETIAPINE FUMARATE 50 MG PO TABS
50.0000 mg | ORAL_TABLET | Freq: Every day | ORAL | Status: DC
  Administered 2018-05-31 – 2018-06-02 (×3): 50 mg via ORAL
  Filled 2018-05-31 (×3): qty 1

## 2018-05-31 MED ORDER — COLLAGENASE 250 UNIT/GM EX OINT
1.0000 "application " | TOPICAL_OINTMENT | Freq: Every day | CUTANEOUS | Status: DC
  Administered 2018-05-31 – 2018-06-02 (×3): 1 via TOPICAL
  Filled 2018-05-31: qty 90

## 2018-05-31 MED ORDER — METOPROLOL TARTRATE 50 MG PO TABS
75.0000 mg | ORAL_TABLET | Freq: Every day | ORAL | Status: DC
  Administered 2018-05-31 – 2018-06-02 (×3): 75 mg via ORAL
  Filled 2018-05-31 (×3): qty 1

## 2018-05-31 MED ORDER — MIDAZOLAM HCL 2 MG/2ML IJ SOLN
INTRAMUSCULAR | Status: AC
  Filled 2018-05-31: qty 2

## 2018-05-31 MED ORDER — PIPERACILLIN-TAZOBACTAM 3.375 G IVPB 30 MIN
3.3750 g | INTRAVENOUS | Status: AC
  Administered 2018-05-31: 3.375 g via INTRAVENOUS
  Filled 2018-05-31 (×2): qty 50

## 2018-05-31 MED ORDER — OXYCODONE HCL 5 MG PO TABS: 5.0000 mg | ORAL_TABLET | Freq: Once | ORAL | Status: DC | PRN

## 2018-05-31 MED ORDER — ONDANSETRON HCL 4 MG/2ML IJ SOLN: 4.0000 mg | Freq: Once | INTRAMUSCULAR | Status: DC | PRN

## 2018-05-31 MED ORDER — BACLOFEN 10 MG PO TABS
10.0000 mg | ORAL_TABLET | Freq: Three times a day (TID) | ORAL | Status: DC
  Administered 2018-05-31 – 2018-06-02 (×7): 10 mg via ORAL
  Filled 2018-05-31 (×7): qty 1

## 2018-05-31 MED ORDER — DESMOPRESSIN ACETATE SPRAY 0.01 % NA SOLN
20.0000 ug | Freq: Two times a day (BID) | NASAL | Status: DC
  Administered 2018-05-31 – 2018-06-02 (×5): 20 ug via NASAL
  Filled 2018-05-31: qty 5

## 2018-05-31 MED ORDER — HYDROCODONE-ACETAMINOPHEN 5-325 MG PO TABS
1.0000 | ORAL_TABLET | Freq: Two times a day (BID) | ORAL | Status: DC
  Administered 2018-05-31 – 2018-06-02 (×5): 1 via ORAL
  Filled 2018-05-31 (×5): qty 1

## 2018-05-31 MED ORDER — DIVALPROEX SODIUM 125 MG PO CSDR
500.0000 mg | DELAYED_RELEASE_CAPSULE | Freq: Three times a day (TID) | ORAL | Status: DC
  Administered 2018-05-31 – 2018-06-02 (×7): 500 mg via ORAL
  Filled 2018-05-31 (×8): qty 4

## 2018-05-31 MED ORDER — SODIUM CHLORIDE 0.9 % IR SOLN
Status: DC | PRN
  Administered 2018-05-31: 6000 mL

## 2018-05-31 MED ORDER — AMLODIPINE BESYLATE 10 MG PO TABS
10.0000 mg | ORAL_TABLET | Freq: Every day | ORAL | Status: DC
  Administered 2018-05-31 – 2018-06-02 (×3): 10 mg via ORAL
  Filled 2018-05-31 (×3): qty 1

## 2018-05-31 MED ORDER — LACTATED RINGERS IV SOLN
INTRAVENOUS | Status: DC
  Administered 2018-05-31: 1000 mL via INTRAVENOUS
  Administered 2018-05-31: 11:00:00 via INTRAVENOUS

## 2018-05-31 MED ORDER — ACETAMINOPHEN 325 MG PO TABS: 325.0000 mg | ORAL_TABLET | ORAL | Status: DC | PRN

## 2018-05-31 MED ORDER — SERTRALINE HCL 50 MG PO TABS
150.0000 mg | ORAL_TABLET | Freq: Every day | ORAL | Status: DC
  Administered 2018-05-31 – 2018-06-02 (×3): 150 mg via ORAL
  Filled 2018-05-31 (×3): qty 1

## 2018-05-31 MED ORDER — PROPOFOL 10 MG/ML IV BOLUS
INTRAVENOUS | Status: DC | PRN
  Administered 2018-05-31: 90 mg via INTRAVENOUS

## 2018-05-31 MED ORDER — CLONAZEPAM 0.5 MG PO TABS
0.5000 mg | ORAL_TABLET | Freq: Every day | ORAL | Status: DC
  Administered 2018-05-31 – 2018-06-02 (×3): 0.5 mg via ORAL
  Filled 2018-05-31 (×3): qty 1

## 2018-05-31 MED ORDER — CEPHALEXIN 500 MG PO CAPS
500.0000 mg | ORAL_CAPSULE | Freq: Three times a day (TID) | ORAL | Status: DC
  Administered 2018-05-31 – 2018-06-01 (×3): 500 mg via ORAL
  Filled 2018-05-31 (×3): qty 1

## 2018-05-31 MED ORDER — LIDOCAINE 2% (20 MG/ML) 5 ML SYRINGE
INTRAMUSCULAR | Status: DC | PRN
  Administered 2018-05-31: 100 mg via INTRAVENOUS

## 2018-05-31 MED ORDER — CLONAZEPAM 1 MG PO TABS
1.0000 mg | ORAL_TABLET | Freq: Two times a day (BID) | ORAL | Status: DC
  Administered 2018-05-31 – 2018-06-02 (×4): 1 mg via ORAL
  Filled 2018-05-31 (×4): qty 1

## 2018-05-31 MED ORDER — IPRATROPIUM-ALBUTEROL 0.5-2.5 (3) MG/3ML IN SOLN: 3.0000 mL | RESPIRATORY_TRACT | Status: DC | PRN

## 2018-05-31 MED ORDER — SUCCINYLCHOLINE CHLORIDE 200 MG/10ML IV SOSY
PREFILLED_SYRINGE | INTRAVENOUS | Status: DC | PRN
  Administered 2018-05-31: 80 mg via INTRAVENOUS

## 2018-05-31 MED ORDER — FENTANYL CITRATE (PF) 100 MCG/2ML IJ SOLN
INTRAMUSCULAR | Status: AC
  Filled 2018-05-31: qty 2

## 2018-05-31 MED ORDER — QUETIAPINE FUMARATE 100 MG PO TABS
100.0000 mg | ORAL_TABLET | Freq: Every day | ORAL | Status: DC
  Administered 2018-05-31 – 2018-06-01 (×2): 100 mg via ORAL
  Filled 2018-05-31 (×2): qty 1

## 2018-05-31 MED ORDER — 0.9 % SODIUM CHLORIDE (POUR BTL) OPTIME
TOPICAL | Status: DC | PRN
  Administered 2018-05-31: 1000 mL

## 2018-05-31 MED ORDER — OXYCODONE HCL 5 MG/5ML PO SOLN
5.0000 mg | Freq: Once | ORAL | Status: DC | PRN
  Filled 2018-05-31: qty 5

## 2018-05-31 MED ORDER — INSULIN ASPART 100 UNIT/ML ~~LOC~~ SOLN
0.0000 [IU] | Freq: Three times a day (TID) | SUBCUTANEOUS | Status: DC
  Administered 2018-06-02 (×2): 2 [IU] via SUBCUTANEOUS

## 2018-05-31 MED ORDER — ACETAMINOPHEN 325 MG PO TABS: 650.0000 mg | ORAL_TABLET | Freq: Four times a day (QID) | ORAL | Status: DC | PRN

## 2018-05-31 MED ORDER — MEPERIDINE HCL 50 MG/ML IJ SOLN: 6.2500 mg | INTRAMUSCULAR | Status: DC | PRN

## 2018-05-31 MED ORDER — ADULT MULTIVITAMIN W/MINERALS CH
1.0000 | ORAL_TABLET | Freq: Every day | ORAL | Status: DC
  Administered 2018-05-31 – 2018-06-02 (×3): 1 via ORAL
  Filled 2018-05-31 (×3): qty 1

## 2018-05-31 MED ORDER — CLINDAMYCIN HCL 150 MG PO CAPS
150.0000 mg | ORAL_CAPSULE | Freq: Four times a day (QID) | ORAL | Status: DC
  Administered 2018-05-31 – 2018-06-02 (×10): 150 mg via ORAL
  Filled 2018-05-31 (×11): qty 1

## 2018-05-31 MED ORDER — SODIUM CHLORIDE 0.45 % IV SOLN
INTRAVENOUS | Status: DC
  Administered 2018-05-31: 13:00:00 via INTRAVENOUS

## 2018-05-31 MED ORDER — PHENYLEPHRINE 40 MCG/ML (10ML) SYRINGE FOR IV PUSH (FOR BLOOD PRESSURE SUPPORT)
PREFILLED_SYRINGE | INTRAVENOUS | Status: DC | PRN
  Administered 2018-05-31: 80 ug via INTRAVENOUS

## 2018-05-31 MED ORDER — QUETIAPINE FUMARATE 25 MG PO TABS
25.0000 mg | ORAL_TABLET | Freq: Every day | ORAL | Status: DC
  Administered 2018-05-31 – 2018-06-01 (×2): 25 mg via ORAL
  Filled 2018-05-31 (×2): qty 1

## 2018-05-31 MED ORDER — IPRATROPIUM-ALBUTEROL 0.5-2.5 (3) MG/3ML IN SOLN
3.0000 mL | Freq: Four times a day (QID) | RESPIRATORY_TRACT | Status: DC
  Filled 2018-05-31: qty 3

## 2018-05-31 MED ORDER — ACETAMINOPHEN 160 MG/5ML PO SOLN: 325.0000 mg | ORAL | Status: DC | PRN

## 2018-05-31 MED ORDER — ONDANSETRON HCL 4 MG/2ML IJ SOLN
INTRAMUSCULAR | Status: DC | PRN
  Administered 2018-05-31: 4 mg via INTRAVENOUS

## 2018-05-31 SURGICAL SUPPLY — 50 items
BAG URINE DRAINAGE (UROLOGICAL SUPPLIES) IMPLANT
BASKET STONE 1.7 NGAGE (UROLOGICAL SUPPLIES) ×2 IMPLANT
BASKET ZERO TIP NITINOL 2.4FR (BASKET) ×2 IMPLANT
BENZOIN TINCTURE PRP APPL 2/3 (GAUZE/BANDAGES/DRESSINGS) ×4 IMPLANT
BLADE SURG 15 STRL LF DISP TIS (BLADE) ×1 IMPLANT
BLADE SURG 15 STRL SS (BLADE) ×1
CATH FOLEY 2W COUNCIL 20FR 5CC (CATHETERS) IMPLANT
CATH ROBINSON RED A/P 20FR (CATHETERS) IMPLANT
CATH X-FORCE N30 NEPHROSTOMY (TUBING) ×2 IMPLANT
COVER SURGICAL LIGHT HANDLE (MISCELLANEOUS) ×2 IMPLANT
COVER WAND RF STERILE (DRAPES) IMPLANT
DRAPE C-ARM 42X120 X-RAY (DRAPES) ×2 IMPLANT
DRAPE LINGEMAN PERC (DRAPES) ×2 IMPLANT
DRAPE SURG IRRIG POUCH 19X23 (DRAPES) ×2 IMPLANT
DRSG PAD ABDOMINAL 8X10 ST (GAUZE/BANDAGES/DRESSINGS) IMPLANT
DRSG TEGADERM 4X4.75 (GAUZE/BANDAGES/DRESSINGS) ×2 IMPLANT
DRSG TEGADERM 8X12 (GAUZE/BANDAGES/DRESSINGS) ×4 IMPLANT
EXTRACTOR STONE NITINOL NGAGE (UROLOGICAL SUPPLIES) ×2 IMPLANT
FIBER LASER FLEXIVA 1000 (UROLOGICAL SUPPLIES) IMPLANT
FIBER LASER FLEXIVA 365 (UROLOGICAL SUPPLIES) IMPLANT
FIBER LASER FLEXIVA 550 (UROLOGICAL SUPPLIES) IMPLANT
FIBER LASER TRAC TIP (UROLOGICAL SUPPLIES) IMPLANT
FLOSEAL 10ML (HEMOSTASIS) ×2 IMPLANT
GAUZE SPONGE 4X4 12PLY STRL (GAUZE/BANDAGES/DRESSINGS) ×2 IMPLANT
GLOVE BIOGEL M 8.0 STRL (GLOVE) ×2 IMPLANT
GOWN STRL REUS W/TWL XL LVL3 (GOWN DISPOSABLE) ×2 IMPLANT
GUIDEWIRE AMPLAZ .035X145 (WIRE) ×4 IMPLANT
GUIDEWIRE STR DUAL SENSOR (WIRE) ×2 IMPLANT
KIT BASIN OR (CUSTOM PROCEDURE TRAY) ×2 IMPLANT
MANIFOLD NEPTUNE II (INSTRUMENTS) ×2 IMPLANT
NS IRRIG 1000ML POUR BTL (IV SOLUTION) ×2 IMPLANT
PACK CYSTO (CUSTOM PROCEDURE TRAY) ×2 IMPLANT
PROBE LITHOCLAST ULTRA 3.8X403 (UROLOGICAL SUPPLIES) IMPLANT
PROBE PNEUMATIC 1.0MMX570MM (UROLOGICAL SUPPLIES) IMPLANT
SET IRRIG Y TYPE TUR BLADDER L (SET/KITS/TRAYS/PACK) IMPLANT
SHEATH PEELAWAY SET 9 (SHEATH) ×2 IMPLANT
SHEATH URETERAL 12FRX35CM (MISCELLANEOUS) ×2 IMPLANT
SPONGE LAP 4X18 RFD (DISPOSABLE) ×2 IMPLANT
STENT URET 6FRX26 CONTOUR (STENTS) ×2 IMPLANT
STONE CATCHER W/TUBE ADAPTER (UROLOGICAL SUPPLIES) IMPLANT
SUT SILK 2 0 30  PSL (SUTURE) ×1
SUT SILK 2 0 30 PSL (SUTURE) ×1 IMPLANT
SYR 10ML LL (SYRINGE) ×2 IMPLANT
SYR 20CC LL (SYRINGE) ×4 IMPLANT
TRAY FOLEY CATH 16FRSI W/METER (SET/KITS/TRAYS/PACK) ×2 IMPLANT
TRAY FOLEY MTR SLVR 14FR STAT (SET/KITS/TRAYS/PACK) IMPLANT
TRAY FOLEY MTR SLVR 16FR STAT (SET/KITS/TRAYS/PACK) ×2 IMPLANT
TUBING CONNECTING 10 (TUBING) ×4 IMPLANT
TUBING UROLOGY SET (TUBING) ×2 IMPLANT
WATER STERILE IRR 1000ML POUR (IV SOLUTION) ×2 IMPLANT

## 2018-05-31 NOTE — Progress Notes (Signed)
Received patient from PACU, VS obtained, patient unable to answer appropriately to admission questions, continues to grind teeth and yell at curse words, Foley cath intact with pink urine present

## 2018-05-31 NOTE — Op Note (Signed)
Preoperative diagnosis: 7 mm proximal right ureteral stone  Postoperative diagnosis: Same  Principal procedure: Right percutaneous ureteroscopy, stone manipulation, placement of 26 cm x 6 French contour double-J stent without tether, fluoroscopic interpretation  Surgeon: Priyal Musquiz  Anesthesia: General endotracheal  Complications: None  Drains: Above mentioned stent, 29 French Foley catheter  Estimated blood loss: 200 mL  Indications: 52 year old male with history of traumatic head injury, admitted urgently in late July of this year with an infected right upper ureteral stone.  He eventually underwent right double-J stent placement followed by percutaneous nephrostomy tube placement.  He presents at this time for percutaneous management of his right proximal ureteral stone.  I discussed the procedure with the patient's sister who has power of attorney.  We discussed the procedure as well as risks and complications including but not limited to infection, bleeding, needing a second procedure, placement of a double-J stent versus nephrostomy tube, anesthetic complications, among others.  They understand these and desire to proceed.  Description of procedure: The patient was properly identified and marked in the holding area.  He received preoperative IV Zosyn.  He was taken to the operating room where general endotracheal anesthetic was administered.  He was then placed in the prone position following bladder catheterization with a 16 French Foley.  All pressure points and extremities were padded appropriately.  The right flank was then prepped and draped around his nephrostomy tube.  Proper timeout was performed.  I then passed a Super Stiff guidewire through the nephrostomy tube, and this coiled within the patient's renal pelvis.  The nephrostomy tube was removed over top of the guidewire.  Kumpe catheter was then placed and the guidewire was advanced into the bladder using fluoroscopic guidance.   The Kumpe catheter was removed over top the guidewire and a peel-away 10 French sheath was placed over top of the working guidewire.  Safety guidewire was then placed into the bladder using fluoroscopic guidance.  I then placed the nephrostomy balloon over top of the guidewire and advanced this into the renal pelvis using fluoroscopic guidance.  The balloon was dilated/filled, to a pressure of 20 atm.  The access sheath was then placed over top of the balloon and into the renal pelvis using fluoroscopic guidance.  The balloon was deflated and removed.  It was difficult to advance the rigid nephroscope into the renal pelvis, but the flexible nephroscope was easily advanced.  Calyces were examined.  No stone was seen.  This was then removed.  I then passed the flexible ureteroscope and again inspected the calyces, no stone was seen.  This was then passed distally into the ureter.  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  This was dilated after I removed the scope with the medium ureteral access sheath, just using the obturator.  I then remove this and passed the ureteroscope.  The stone was identified in the distal ureter.  The scope was then used to push the stone into the patient's bladder.  No further stone material was seen.  The patient is able to void spontaneously, and it is felt that the patient can void this out without difficulty, as it was fragmented into several pieces.  I then remove the ureteroscope.  Using the nephroscope, 26 cm x 6 French contour double-J stent was passed using fluoroscopic and nephroscopic guidance.  Good proximal distal curls were seen.  At this point, the laparoscopic trocar for FloSeal was used to measure the distance between the edge of the  renal parenchyma and the skin.  I then filled the access tract with FloSeal.  The remaining guidewire was then removed.  There was adequate hemostasis.  2-0 silk was used to reapproximate the wound edges.  Dry sterile dressing was  placed.  At this point, the procedure was terminated.  The patient was awakened, extubated, and taken to the PACU in stable condition.  He tolerated the procedure well.

## 2018-05-31 NOTE — Progress Notes (Signed)
Attempted to assess BLEs and remove dressings to do skin assessment with second nurse but pt's sister did not want it dine at this time, she wanted to feed patient and stated it could be done later

## 2018-05-31 NOTE — Transfer of Care (Signed)
Immediate Anesthesia Transfer of Care Note  Patient: Rodrigo Ran  Procedure(s) Performed: RIGHT NEPHROLITHOTOMY PERCUTANEOUS STONE MANIPULATION (Right )  Patient Location: PACU  Anesthesia Type:General  Level of Consciousness: sedated  Airway & Oxygen Therapy: Patient Spontanous Breathing and Patient connected to face mask oxygen  Post-op Assessment: Report given to RN and Post -op Vital signs reviewed and stable  Post vital signs: Reviewed and stable  Last Vitals:  Vitals Value Taken Time  BP 142/84 05/31/2018 11:20 AM  Temp    Pulse 86 05/31/2018 11:21 AM  Resp 26 05/31/2018 11:21 AM  SpO2 100 % 05/31/2018 11:21 AM  Vitals shown include unvalidated device data.  Last Pain:  Vitals:   05/31/18 0752  TempSrc: Oral      Patients Stated Pain Goal: Other (Comment) (05/31/18 0848)  Complications: No apparent anesthesia complications

## 2018-05-31 NOTE — Interval H&P Note (Signed)
History and Physical Interval Note:  05/31/2018 9:02 AM  Cody [name]  has presented today for surgery, with the diagnosis of RIGHT URETERAL CALCULI  The various methods of treatment have been discussed with the patient and family. After consideration of risks, benefits and other options for treatment, the patient has consented to  Procedure(s) with comments: RIGHT NEPHROLITHOTOMY PERCUTANEOUS (Right) - 90 MINS NEEDS DIGITAL URETEROSCOPE HOLMIUM LASER APPLICATION (Right) as a surgical intervention .  [name] history has been reviewed, patient examined, no change in status, stable for surgery.  I have reviewed the patient's chart and labs.  Questions were answered to the patient's satisfaction.     Bertram Millard Daimian Sudberry

## 2018-05-31 NOTE — Progress Notes (Signed)
Pt stable this pm. Seemingly in no pain.   Will check on/possibly d/c in am.

## 2018-05-31 NOTE — Anesthesia Procedure Notes (Signed)
Procedure Name: Intubation Date/Time: 05/31/2018 9:30 AM Performed by: Lind Covert, CRNA Pre-anesthesia Checklist: Patient identified, Emergency Drugs available, Suction available, Patient being monitored and Timeout performed Patient Re-evaluated:Patient Re-evaluated prior to induction Oxygen Delivery Method: Circle system utilized Preoxygenation: Pre-oxygenation with 100% oxygen Induction Type: IV induction Laryngoscope Size: Mac, Glidescope and 4 Grade View: Grade I Tube type: Oral Tube size: 7.5 mm Number of attempts: 1 Airway Equipment and Method: Stylet Placement Confirmation: ETT inserted through vocal cords under direct vision,  positive ETCO2 and breath sounds checked- equal and bilateral Secured at: 22 cm Tube secured with: Tape Dental Injury: Teeth and Oropharynx as per pre-operative assessment  Difficulty Due To: Difficulty was anticipated, Difficult Airway- due to immobile epiglottis, Difficult Airway- due to large tongue, Difficult Airway- due to reduced neck mobility and Difficult Airway- due to limited oral opening Future Recommendations: Recommend- induction with short-acting agent, and alternative techniques readily available Comments: No head movement, large tongue, epiglottis is difficulty to move.

## 2018-05-31 NOTE — H&P (Signed)
H&P  Chief Complaint: Right-sided kidney stone  History of Present Illness:  52 year old male who presents at this time for management of a right proximal ureteral / UPJ stone.  He presented  Emergently on 03/11/2018 for possible UTI associated with an obstructing right proximal ureteral stone.  Emergent cystoscopy and stenting was performed.  Stent was quite difficult to place due to a number of factors, and he eventually had percutaneous tube placed due to inadequate drainage of his double-J stent.  He does have a 7 mm stone which is persistent at his right UPJ.  He presents at this time for management.  He has been on recent preoperative suppressive antibiotics due to his chronic debilitated state as well as history of UTIs.  Past Medical History:  Diagnosis Date  . Constipation   . Contracture, left elbow   . Contracture, left hand   . Contracture, left hip   . Contracture, left knee   . Decubitus ulcer    left hip and ankle   . Depression    major depressive disorder   . Diabetes mellitus without complication (HCC)    type 2   . Drug induced subacute dyskinesia   . Dysphagia, oropharyngeal phase   . Dysphagia, pharyngeal phase   . GAD (generalized anxiety disorder)    generalized anxiety disorder   . Hypertension   . Hypothyroidism   . Other somatoform disorders   . Personal history of traumatic brain injury   . Spastic hemiplegia (HCC)   . Thyroid disease    hypothyroidism  . Unspecified convulsions (HCC)   . Unspecified mood (affective) disorder (HCC)   . Unspecified psychosis not due to a substance or known physiological condition [facility])     Past Surgical History:  Procedure Laterality Date  . CYSTOSCOPY W/ URETERAL STENT PLACEMENT Right 03/11/2018   Procedure: CYSTOSCOPY WITH RETROGRADE PYELOGRAM/DOUBLE J STENT PLACEMENT;  Surgeon: [name], MD;  Location: [facility] OR;  Service: Urology;  Laterality: Right;  . IR NEPHROSTOGRAM RIGHT THRU EXISTING ACCESS  04/06/2018   . IR NEPHROSTOMY PLACEMENT RIGHT  03/31/2018  . IR RADIOLOGY PERIPHERAL GUIDED IV START  03/31/2018  . IR US GUIDE VASC ACCESS LEFT  03/31/2018  . VENTRICULOPERITONEAL SHUNT      Home Medications:    Allergies: No Known Allergies  Family History  Problem Relation Age of Onset  . Hypertension Mother   . Stroke Mother   . Hypertension Father   . Hyperlipidemia Father   . COPD Father   . Heart disease Father   . Diabetes Mellitus II Father     Social History:  reports that he has never smoked. He has never used smokeless tobacco. He reports that he does not drink alcohol or use drugs.  ROS: A complete review of systems was performed.  All systems are negative except for pertinent findings as noted.  Physical Exam:  Vital signs in last 24 hours: Temp:  [98.6 F (37 C)] 98.6 F (37 C) (10/14 0752) Pulse Rate:  [102] 102 (10/14 0752) Resp:  [18] 18 (10/14 0752) BP: (129)/(80) 129/80 (10/14 0752) SpO2:  [99 %] 99 % (10/14 0752) Constitutional:    Patient non communicative.  His sister presents with him. Cardiovascular: Regular rate  Respiratory: Normal respiratory effort GI: Abdomen is soft, nontender, nondistended, no abdominal masses .  Right percutaneous nephrostomy tube is placed Lymphatic: No lymphadenopathy Neurologic: Grossly intact, no focal deficits Psychiatric:  Responsive to sound  Laboratory Data:  Recent Labs  05/31/18 0825  WBC 9.9  HGB 8.0*  HCT 29.3*  PLT 370    No results for input(s): NA, K, CL, GLUCOSE, BUN, CALCIUM, CREATININE in the last 72 hours.  Invalid input(s): CO3   Results for orders placed or performed during the hospital encounter of 05/31/18 (from the past 24 hour(s))  CBC     Status: Abnormal   Collection Time: 05/31/18  8:25 AM  Result Value Ref Range   WBC 9.9 4.0 - 10.5 K/uL   RBC 3.38 (L) 4.22 - 5.81 MIL/uL   Hemoglobin 8.0 (L) 13.0 - 17.0 g/dL   HCT 98.1 (L) 19.1 - 47.8 %   MCV 86.7 80.0 - 100.0 fL   MCH 23.7 (L) 26.0  - 34.0 pg   MCHC 27.3 (L) 30.0 - 36.0 g/dL   RDW 29.5 (H) 62.1 - 30.8 %   Platelets 370 150 - 400 K/uL   nRBC 0.3 (H) 0.0 - 0.2 %   No results found for this or any previous visit (from the past 240 hour(s)).  Renal Function: No results for input(s): CREATININE in the last 168 hours. CrCl cannot be calculated (Patient's most recent lab result is older than the maximum 21 days allowed.).  Radiologic Imaging: No results found.  Impression/Assessment:   right proximal ureteral stone  Plan:   right percutaneous nephrolithotomy

## 2018-05-31 NOTE — Anesthesia Preprocedure Evaluation (Addendum)
Anesthesia Evaluation  Patient identified by MRN, date of birth, ID band Patient unresponsive    Reviewed: Allergy & Precautions, NPO status , Patient's Chart, lab work & pertinent test results  Airway Mallampati: II  TM Distance: <3 FB Neck ROM: Limited  Mouth opening: Limited Mouth Opening Comment: CONSTANT RUMINATION  UNABLE TO FULLY EVALUATE AIRWAY Dental  (+) Teeth Intact, Poor Dentition, Missing   Pulmonary neg pulmonary ROS,     + decreased breath sounds      Cardiovascular hypertension, Pt. on medications and Pt. on home beta blockers  Rhythm:Regular Rate:Normal     Neuro/Psych Seizures -,  PSYCHIATRIC DISORDERS Anxiety Depression Schizophrenia TIA Neuromuscular disease CVA, Residual Symptoms    GI/Hepatic negative GI ROS, Neg liver ROS,   Endo/Other  diabetes, Type 2, Insulin DependentHypothyroidism   Renal/GU ARFRenal disease     Musculoskeletal negative musculoskeletal ROS (+)   Abdominal   Peds  Hematology negative hematology ROS (+)   Anesthesia Other Findings   Reproductive/Obstetrics negative OB ROS                          Lab Results  Component Value Date   WBC 9.8 04/06/2018   HGB 8.5 (L) 04/06/2018   HCT 28.6 (L) 04/06/2018   MCV 88.5 04/06/2018   PLT 122 (L) 04/06/2018   Lab Results  Component Value Date   CREATININE 0.75 04/06/2018   BUN 20 04/06/2018   NA 142 04/06/2018   K 3.3 (L) 04/06/2018   CL 111 04/06/2018   CO2 26 04/06/2018   Lab Results  Component Value Date   INR 1.29 03/31/2018   INR 1.41 03/10/2018   INR 1.07 11/21/2009     Anesthesia Physical  Anesthesia Plan  ASA: IV  Anesthesia Plan: General   Post-op Pain Management:    Induction: Intravenous  PONV Risk Score and Plan: 3 and Ondansetron and Treatment may vary due to age or medical condition  Airway Management Planned: Oral ETT and LMA  Additional Equipment: None  Intra-op  Plan:   Post-operative Plan: Extubation in OR  Informed Consent: I have reviewed the patients History and Physical, chart, labs and discussed the procedure including the risks, benefits and alternatives for the proposed anesthesia with the patient or authorized representative who has indicated his/her understanding and acceptance.     Plan Discussed with: CRNA, Anesthesiologist and Surgeon  Anesthesia Plan Comments:        Anesthesia Quick Evaluation

## 2018-05-31 NOTE — Anesthesia Postprocedure Evaluation (Signed)
Anesthesia Post Note  Patient: Cody [name]  Procedure(s) Performed: RIGHT NEPHROLITHOTOMY PERCUTANEOUS STONE MANIPULATION (Right )     Patient location during evaluation: PACU Anesthesia Type: General Level of consciousness: awake and alert Pain management: pain level controlled Vital Signs Assessment: post-procedure vital signs reviewed and stable Respiratory status: spontaneous breathing, nonlabored ventilation and respiratory function stable Cardiovascular status: blood pressure returned to baseline and stable Postop Assessment: no apparent nausea or vomiting Anesthetic complications: no    Last Vitals:  Vitals:   05/31/18 1200 05/31/18 1225  BP: (!) 147/83 (!) 175/92  Pulse: 83 92  Resp: (!) 22 20  Temp: (!) 36.4 C 36.6 C  SpO2: 99% (!) 85%    Last Pain:  Vitals:   05/31/18 0752  TempSrc: Oral                 Lowella Curb

## 2018-06-01 ENCOUNTER — Encounter (HOSPITAL_COMMUNITY): Payer: Self-pay | Admitting: Urology

## 2018-06-01 DIAGNOSIS — N201 Calculus of ureter: Secondary | ICD-10-CM | POA: Diagnosis not present

## 2018-06-01 LAB — GLUCOSE, CAPILLARY
GLUCOSE-CAPILLARY: 105 mg/dL — AB (ref 70–99)
Glucose-Capillary: 84 mg/dL (ref 70–99)
Glucose-Capillary: 118 mg/dL — ABNORMAL HIGH (ref 70–99)
Glucose-Capillary: 119 mg/dL — ABNORMAL HIGH (ref 70–99)

## 2018-06-01 LAB — HEMOGLOBIN AND HEMATOCRIT, BLOOD
HCT: 28.1 % — ABNORMAL LOW (ref 39.0–52.0)
HEMOGLOBIN: 7.5 g/dL — AB (ref 13.0–17.0)

## 2018-06-01 MED ORDER — CEPHALEXIN 500 MG PO CAPS: 500.0000 mg | ORAL_CAPSULE | Freq: Three times a day (TID) | ORAL | 0 refills | Status: DC

## 2018-06-01 NOTE — Progress Notes (Signed)
CSW aware patient stable for discharge today back to [facility]- DC summary and AVS do not reflect same medications. DC summary currently has no medications and AVS states patient needs keflex w/ other medications. Patient unable to DC without script for Keflex- both dc summary and AVS need to reflect same discharge instructions.   CSW spoke with representative from facility and they are unable to accept patient until AVS and DC summary reflect same medications.   Stacy Gardner, LCSW Clinical Social Worker  [facility] [employer]  [phone]

## 2018-06-01 NOTE — Discharge Summary (Deleted)
Patient ID: Cody [name] MRN: [id number] DOB/AGE: 1966/07/10 52 y.o.  Admit date: 05/31/2018 Discharge date: 06/01/2018  Primary Care Physician:  [name], MD  Discharge Diagnoses:  Rt ureteral calculus, stage 2 pressure ulcer Present on Admission: . Calculus of ureter . Pressure ulcer, stage 2 (HCC)   Consults:  None     Discharge Medications:    Significant Diagnostic Studies:  Dg C-arm 1-60 Min-no Report  Result Date: 05/31/2018 Fluoroscopy was utilized by the requesting physician.  No radiographic interpretation.    Brief H and P: For complete details please refer to admission H and P, but in brief  Pt w/ hx of brain trauma with ubstructing rt ureteral stone, s/p rt pcn tube--admitted for perc mgmt of ureteral stone.  Hospital Course:  Active Problems:   Pressure ulcer, stage 2 (HCC)   Calculus of ureter No postop problems noted after pcn urs procedure. Hgb stable after procedure.  Day of Discharge BP 120/80 (BP Location: Right Arm)   Pulse 88   Temp 97.7 F (36.5 C) (Oral)   Resp 18   Ht 5\' 7"  (1.702 m)   Wt 65.4 kg   SpO2 100%   BMI 22.58 kg/m   Results for orders placed or performed during the hospital encounter of 05/31/18 (from the past 24 hour(s))  Basic metabolic panel     Status: Abnormal   Collection Time: 05/31/18  8:25 AM  Result Value Ref Range   Sodium 144 135 - 145 mmol/L   Potassium 3.6 3.5 - 5.1 mmol/L   Chloride 111 98 - 111 mmol/L   CO2 27 22 - 32 mmol/L   Glucose, Bld 88 70 - 99 mg/dL   BUN 12 6 - 20 mg/dL   Creatinine, Ser 1.47 (L) 0.61 - 1.24 mg/dL   Calcium 7.9 (L) 8.9 - 10.3 mg/dL   GFR calc non Af Amer >60 >60 mL/min   GFR calc Af Amer >60 >60 mL/min   Anion gap 6 5 - 15  CBC     Status: Abnormal   Collection Time: 05/31/18  8:25 AM  Result Value Ref Range   WBC 9.9 4.0 - 10.5 K/uL   RBC 3.38 (L) 4.22 - 5.81 MIL/uL   Hemoglobin 8.0 (L) 13.0 - 17.0 g/dL   HCT 82.9 (L) 56.2 - 13.0 %   MCV 86.7 80.0 - 100.0 fL    MCH 23.7 (L) 26.0 - 34.0 pg   MCHC 27.3 (L) 30.0 - 36.0 g/dL   RDW 86.5 (H) 78.4 - 69.6 %   Platelets 370 150 - 400 K/uL   nRBC 0.3 (H) 0.0 - 0.2 %  Hemoglobin A1c     Status: None   Collection Time: 05/31/18  8:25 AM  Result Value Ref Range   Hgb A1c MFr Bld 5.6 4.8 - 5.6 %   Mean Plasma Glucose 114.02 mg/dL  Sample to Blood Bank     Status: None   Collection Time: 05/31/18  8:25 AM  Result Value Ref Range   Blood Bank Specimen SAMPLE AVAILABLE FOR TESTING    Sample Expiration      06/03/2018 Performed at [facility], [address], [place], [place] [address]   Glucose, capillary     Status: None   Collection Time: 05/31/18  9:04 AM  Result Value Ref Range   Glucose-Capillary 80 70 - 99 mg/dL   Comment 1 Notify RN    Comment 2 Document in Chart   Glucose, capillary  Status: None   Collection Time: 05/31/18 11:29 AM  Result Value Ref Range   Glucose-Capillary 94 70 - 99 mg/dL  MRSA PCR Screening     Status: None   Collection Time: 05/31/18  1:16 PM  Result Value Ref Range   MRSA by PCR NEGATIVE NEGATIVE  Glucose, capillary     Status: Abnormal   Collection Time: 05/31/18  4:10 PM  Result Value Ref Range   Glucose-Capillary 106 (H) 70 - 99 mg/dL  Glucose, capillary     Status: Abnormal   Collection Time: 05/31/18 10:25 PM  Result Value Ref Range   Glucose-Capillary 129 (H) 70 - 99 mg/dL  Hemoglobin and hematocrit, blood     Status: Abnormal   Collection Time: 06/01/18  4:35 AM  Result Value Ref Range   Hemoglobin 7.5 (L) 13.0 - 17.0 g/dL   HCT 13.0 (L) 86.5 - 78.4 %    Physical Exam: General: Alert and awake oriented x3 not in any acute distress. HEENT: anicteric sclera, pupils reactive to light and accommodation CVS: S1-S2 clear no murmur rubs or gallops Chest: clear to auscultation bilaterally, no wheezing rales or rhonchi Abdomen: soft nontender, nondistended, normal bowel sounds, no organomegaly Extremities: no cyanosis, clubbing or  edema noted bilaterally Neuro: Cranial nerves II-XII intact, no focal neurological deficits  Disposition:  To snf  Diet:  Regular/pureed  Activity:  BR   Disposition and Follow-up:    To snf. Postop appt set up  TESTS THAT NEED FOLLOW-UP  N/A  DISCHARGE FOLLOW-UP Follow-up Information    [name], MD Follow up.   Specialty:  Urology Why:  10/28 at 3 pm Contact information: [address] [place] [address] [phone]           Time spent on Discharge:  15 mins  Signed: Bertram Millard Janari Gagner 06/01/2018, 8:20 AM

## 2018-06-01 NOTE — Discharge Summary (Signed)
Patient ID: Cody [name] MRN: [id number] DOB/AGE: 1965-10-28 52 y.o.  Admit date: 05/31/2018 Discharge date: 06/01/2018  Primary Care Physician:  [name], MD  Discharge Diagnoses:  Ureteral stone Present on Admission: . Calculus of ureter . Pressure ulcer, stage 2 (HCC)   Consults:  None   Discharge Medications: Allergies as of 06/01/2018   No Known Allergies     Medication List    TAKE these medications   acetaminophen 325 MG tablet Commonly known as:  TYLENOL Take 2 tablets (650 mg total) by mouth every 6 (six) hours as needed for mild pain, moderate pain or fever.   amLODipine 10 MG tablet Commonly known as:  NORVASC Take 10 mg by mouth daily.   baclofen 10 MG tablet Commonly known as:  LIORESAL Take 10 mg by mouth 3 (three) times daily.   cephALEXin 500 MG capsule Commonly known as:  KEFLEX Take 1 capsule (500 mg total) by mouth 3 (three) times daily.   clindamycin 150 MG capsule Commonly known as:  CLEOCIN Take 150 mg by mouth 4 (four) times daily.   clonazePAM 0.5 MG tablet Commonly known as:  KLONOPIN Take 0.5 mg by mouth daily at 2 PM. What changed:  Another medication with the same name was changed. Make sure you understand how and when to take each.   clonazePAM 1 MG tablet Commonly known as:  KLONOPIN Take 0.5-1 tablets (0.5-1 mg total) by mouth See admin instructions. Take 1 tablet at 8 am and 8 pm then Take 1/2 tablet at 2 pm What changed:    how much to take  when to take this  additional instructions   collagenase ointment Commonly known as:  SANTYL Apply 1 application topically daily.   desmopressin 0.01 % solution Commonly known as:  DDAVP NASAL Place 2 sprays (20 mcg total) into the nose 2 (two) times daily.   divalproex 125 MG capsule Commonly known as:  DEPAKOTE SPRINKLE Take 500 mg by mouth 3 (three) times daily.   feeding supplement (PRO-STAT SUGAR FREE 64) Liqd Take 30 mLs by mouth 2 (two) times daily.    ferrous sulfate 325 (65 FE) MG tablet Take 1 tablet (325 mg total) by mouth daily with breakfast.   HYDROcodone-acetaminophen 5-325 MG tablet Commonly known as:  NORCO/VICODIN Take 1 tablet by mouth 2 (two) times daily.   insulin aspart 100 UNIT/ML injection Commonly known as:  novoLOG Inject 0-9 Units into the skin 3 (three) times daily with meals. CBG < 70: implement hypoglycemia protocol CBG 70 - 120: 0 units CBG 121 - 150: 1 unit CBG 151 - 200: 2 units CBG 201 - 250: 3 units CBG 251 - 300: 5 units CBG 301 - 350: 7 units CBG 351 - 400: 9 units CBG > 400: call MD.   insulin detemir 100 UNIT/ML injection Commonly known as:  LEVEMIR Inject 0.1 mLs (10 Units total) into the skin daily. What changed:  when to take this   ipratropium-albuterol 0.5-2.5 (3) MG/3ML Soln Commonly known as:  DUONEB Take 3 mLs by nebulization 4 (four) times daily.   metoprolol tartrate 25 MG tablet Commonly known as:  LOPRESSOR Take 75 mg by mouth daily.   multivitamin with minerals Tabs tablet Take 1 tablet by mouth daily.   phenylephrine-shark liver oil-mineral oil-petrolatum 0.25-3-14-71.9 % rectal ointment Commonly known as:  PREPARATION H Place 1 application rectally 3 (three) times daily as needed for hemorrhoids.   polyethylene glycol packet Commonly known as:  MIRALAX / GLYCOLAX  Take 17 g by mouth daily.   QUEtiapine 100 MG tablet Commonly known as:  SEROQUEL Take 100 mg by mouth at bedtime. Take with 25 mg to equal 125 mg   QUEtiapine 25 MG tablet Commonly known as:  SEROQUEL Take 25 mg by mouth at bedtime. Take with 100 mg to equal 125 mg   QUEtiapine 50 MG tablet Commonly known as:  SEROQUEL Take 50 mg by mouth daily.   selenium sulfide 1 % Lotn Commonly known as:  SELSUN Apply 1 application topically See admin instructions. Use every Tuesday, Thursday, and Saturday   sertraline 50 MG tablet Commonly known as:  ZOLOFT Take 150 mg by mouth daily.         Significant Diagnostic Studies:  Dg C-arm 1-60 Min-no Report  Result Date: 05/31/2018 Fluoroscopy was utilized by the requesting physician.  No radiographic interpretation.    Brief H and P: For complete details please refer to admission H and P, but in brief pt admitted for stone [facility] Course:  Active Problems:   Pressure ulcer, stage 2 (HCC)   Calculus of ureter   Day of Discharge BP 120/80 (BP Location: Right Arm)   Pulse 88   Temp 97.7 F (36.5 C) (Oral)   Resp 18   Ht 5\' 7"  (1.702 m)   Wt 65.4 kg   SpO2 100%   BMI 22.58 kg/m   Results for orders placed or performed during the hospital encounter of 05/31/18 (from the past 24 hour(s))  Glucose, capillary     Status: Abnormal   Collection Time: 05/31/18  4:10 PM  Result Value Ref Range   Glucose-Capillary 106 (H) 70 - 99 mg/dL  Glucose, capillary     Status: Abnormal   Collection Time: 05/31/18 10:25 PM  Result Value Ref Range   Glucose-Capillary 129 (H) 70 - 99 mg/dL  Hemoglobin and hematocrit, blood     Status: Abnormal   Collection Time: 06/01/18  4:35 AM  Result Value Ref Range   Hemoglobin 7.5 (L) 13.0 - 17.0 g/dL   HCT 16.1 (L) 09.6 - 04.5 %  Glucose, capillary     Status: None   Collection Time: 06/01/18  8:03 AM  Result Value Ref Range   Glucose-Capillary 84 70 - 99 mg/dL  Glucose, capillary     Status: Abnormal   Collection Time: 06/01/18 12:14 PM  Result Value Ref Range   Glucose-Capillary 105 (H) 70 - 99 mg/dL    Physical Exam: General: Alert and awake oriented x3 not in any acute distress. HEENT: anicteric sclera, pupils reactive to light and accommodation CVS: S1-S2 clear no murmur rubs or gallops Chest: clear to auscultation bilaterally, no wheezing rales or rhonchi Abdomen: soft nontender, nondistended, normal bowel sounds, no organomegaly Extremities: no cyanosis, clubbing or edema noted bilaterally Neuro: Cranial nerves II-XII intact, no focal neurological  deficits  Disposition:  SNF  Diet:  pureed  Activity:  BR   Disposition and Follow-up: Discharge Instructions    Care order/instruction   Complete by:  As directed    Cancel keflex order     Do not take keflex  TESTS THAT NEED FOLLOW-UP    DISCHARGE FOLLOW-UP Follow-up Information    [name], MD Follow up.   Specialty:  Urology Why:  10/28 at 3 pm Contact information: [address] [place] [address] [phone]           Time spent on Discharge:   Signed: Chelsea Aus 06/01/2018, 4:09  PM

## 2018-06-01 NOTE — Discharge Instructions (Signed)
DISCHARGE INSTRUCTIONS FOR PERCUTANEOUS STONE SURGERY MEDICATIONS:  1. DO NOT RESUME YOUR IBUPROFEN, or any other medicines like aspirin, motrin, excedrin, advil, aleve, vitamin E, fish oil as these can all cause bleeding x 10 days.  2. Resume all your other meds from home - except do not take any other pain meds that you may have at home.  ACTIVITY 1. No strenuous activity, sexual activity, or lifting greater than 10 pounds for 2 weeks. 2. No driving while on narcotic pain medications 3. Drink plenty of water 4. Continue to walk at home - you can still get blood clots when you are at home, so keep active, but don't over do it. 5. May return to work in 1 week (but not heavy or strenuous activity).   BATHING You can shower.  Cover your wound with a dressing and remove the dressing immediately after the shower.  Do not submerge wound under water.   WOUND CARE Your wound will drain bloody fluid and may do so for 7-14 days. You have 2 options for dressings:  1. You may use gauze and tape to dress your wound.  If you choose this method, then change the dressing as it becomes soaked.  Change it at least once daily until it stops draining. You may switch to a Band Aid once drainage stops. 2. If drainage is copious, you may use an ostomy device.  This is a bag with an andhesive circle.  The circle has a hole in the middle of it and you cut the hole to the size needed to fit the wound.  This will collect the drainage in the bag and allow you to drain the bag as needed.    

## 2018-06-02 DIAGNOSIS — N201 Calculus of ureter: Secondary | ICD-10-CM | POA: Diagnosis not present

## 2018-06-02 LAB — GLUCOSE, CAPILLARY
Glucose-Capillary: 91 mg/dL (ref 70–99)
Glucose-Capillary: 140 mg/dL — ABNORMAL HIGH (ref 70–99)
Glucose-Capillary: 129 mg/dL — ABNORMAL HIGH (ref 70–99)

## 2018-06-02 MED ORDER — VITAMINS A & D EX OINT
TOPICAL_OINTMENT | CUTANEOUS | Status: AC
  Filled 2018-06-02: qty 5

## 2018-06-02 NOTE — Plan of Care (Signed)
  Problem: Clinical Measurements: Goal: Will remain free from infection Outcome: Progressing Goal: Respiratory complications will improve Outcome: Progressing Goal: Cardiovascular complication will be avoided Outcome: Progressing   Reeves Forth, RN 06/02/18 4:35 AM

## 2018-06-02 NOTE — Plan of Care (Signed)

## 2018-06-02 NOTE — Progress Notes (Signed)
Report called to [facility] at [facility]. All questions answered and contact number provided. Pt sister at bedside and aware of transfer and agreeable.

## 2018-06-02 NOTE — Care Management Obs Status (Signed)
MEDICARE OBSERVATION STATUS NOTIFICATION   Patient Details  Name: Cody [name] MRN: [id number] Date of Birth: 1965-09-02   Medicare Observation Status Notification Given: No, pt is confused. Information called to pt's sister.       Geni Bers, RN 06/02/2018, 4:34 PM

## 2018-06-02 NOTE — Progress Notes (Addendum)
CSW following to assist with DC- plan to return to [facility]. Provided facility with DC information yesterday 06/01/18. Per facility, need clarification as DC summary lists Keflex in DC medications with instructions to not take. Facility requests hard signed script for medications if listed on DC summary, or if pt to not take Keflex, for medication to be removed from list. Left message for attending with SNF's request.  Ilean Skill, MSW, LCSW Clinical Social Work 06/02/2018 [phone]  15:06- Received message from attending office stating yesterday pt's AVS updated with removed order for Keflex- per attending pt does not need this medication. Included in DC packet and communicated with SNF- agreeable with understanding that medication is still listed on DC summary with instructions not to take.  [facility]AR transportation arranged- Report [phone]  Pt's sister Cody [name] made aware via phone-agreeable.

## 2018-06-02 NOTE — Telephone Encounter (Signed)
S-Pt states he received a letter to return call to office re: lab results.   B-Patient has new Phone Number [phone],  A- See Previous Encounter-states he is taking his diabetic medication  R-Test results relayed to patient as documented, no further questions.     Reason for Disposition  ??? Health Information question, no triage required and triager able to answer question    Protocols used: INFORMATION ONLY CALL-ADULT-AH

## 2018-06-04 ENCOUNTER — Inpatient Hospital Stay: Admit: 2018-06-04 | Discharge: 2018-06-05 | Disposition: A | Discharge status: Home / Self Care

## 2018-06-04 DIAGNOSIS — T40601A Poisoning by unspecified narcotics, accidental (unintentional), initial encounter: Secondary | ICD-10-CM

## 2018-06-04 LAB — POCT GLUCOSE: POC Glucose: 249 mg/dL — ABNORMAL HIGH (ref 70–100)

## 2018-06-04 NOTE — ED Provider Notes (Signed)
Emergency DepartmentEncounter  [facility] EMERGENCY DEPT    Patient: Mason Garza  KVQ:[id number]  DOB: 1966/07/09  Date of Evaluation: 06/04/2018  ED APP Provider: Alroy Dust, APRN - CNP    This patient was evaluated and treated within my scope of practice. Attending was available for consult.    Chief Complaint       Chief Complaint   Patient presents with   ??? Drug Overdose     Pt overdosed on Fentanyl today @ work.  48m Narcan given by squad.     HOPI     Mason RADUis a 52y.o. male who presents to the emergency department to accidental drug overdose which occurred at work prior to arrival. Patient was found unresponsive by coworkers, given 4 mg of Narcan and became arousable. Upon arrival To the ER he is alert and oriented ??3, no distress, vital signs are stable. Patient endorses snorting fentanyl for his chronic pain.  Was not attempting to harm himself. Patient states that he is scheduled for pain management.    ROS:     Review of Systems  At least 10 systemsreviewed and otherwise acutely negative except as [place] the [place]    Past History     Past Medical History:   Diagnosis Date   ??? Chronic hepatitis C ([place] 11/22/2015    active IVDU   ??? H/O echocardiogram 05/12/11    EF62%, mild LAE   ??? MRSA (methicillin resistant Staphylococcus aureus) infection 10/05/2017   ??? Neuropathy    ??? 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 Psychiatric problem    ??? Type II or unspecified type diabetes mellitus without mention of complication, not stated as uncontrolled      Past Surgical History:   Procedure Laterality Date   ??? OTHER SURGICAL HISTORY Bilateral 10/06/2017    Irrigation and Debridement of Abscesses arms and scalp     Social History     Socioeconomic History   ??? Marital status: Divorced     Spouse name: Not on file   ??? Number of children: Not on file   ??? Years of education: Not on file   ??? Highest education level: Not on file   Occupational History   ??? Not on file   Social Needs   ??? Financial  resource strain: Not on file   ??? Food insecurity:     Worry: Not on file     Inability: Not on file   ??? Transportation needs:     Medical: Not on file     Non-medical: Not on file   Tobacco Use   ??? Smoking status: Current Every Day Smoker     Packs/day: 1.00     Years: 30.00     Pack years: 30.00     Types: Cigarettes   ??? Smokeless tobacco: Never Used   ??? Tobacco comment: nicotene patch requested   Substance and Sexual Activity   ??? Alcohol use: No     Alcohol/week: 0.0 standard drinks     Comment: 6 pack per yr now; former 6-12 pk per day for 10 yrs   ??? Drug use: Yes     Frequency: 10.0 times per week     Types: Opiates , IV, Methamphetamines     Comment: heroin IVDU daily   ??? Sexual activity: Yes     Partners: Female   Lifestyle   ??? Physical activity:  Days per week: Not on file     Minutes per session: Not on file   ??? Stress: Not on file   Relationships   ??? Social connections:     Talks on phone: Not on file     Gets together: Not on file     Attends religious service: Not on file     Active member of club or organization: Not on file     Attends meetings of clubs or organizations: Not on file     Relationship status: Not on file   ??? Intimate partner violence:     Fear of current or ex partner: Not on file     Emotionally abused: Not on file     Physically abused: Not on file     Forced sexual activity: Not on file   Other Topics Concern   ??? Not on file   Social History Narrative    In [place] house 12/2017    Looking for a place to live       Medications/Allergies     Previous Medications    ALCOHOL SWABS (ALCOHOL PADS) 70 % PADS    1 Package by Does not apply route 3 times daily (before meals)    BLOOD GLUCOSE MONITOR STRIPS    Test 3 times a day & as needed for symptoms of irregular blood glucose.dispense as covered by insurance    GLUCOSE MONITORING KIT (FREESTYLE) MONITORING KIT    1 kit by Does not apply route daily Dispense as covered by insurance    LANCETS MISC    Dispense lancet device DISPENSE AS  COVERED BY INSURANCE    LANCETS MISC    1 each by Does not apply route 3 times daily (before meals) DISPENSE AS COVERED BY INSURANCE    NAPROXEN (NAPROSYN) 500 MG TABLET    Take 1 tablet by mouth 2 times daily as needed for Pain     Allergies   Allergen Reactions   ??? Penicillins Hives        Physical Exam       ED Triage Vitals [06/04/18 1822]   BP Temp Temp Source Pulse Resp SpO2 Height Weight   118/73 97.7 ??F (36.5 ??C) Oral 97 18 94 % 5' 11"  (1.803 m) 180 lb (81.6 kg)     Physical Exam   Constitutional: He is oriented to person, place, and time. He appears well-developed and well-nourished.   HENT:   Head: Normocephalic and atraumatic.   Mouth/Throat: Oropharynx is clear and moist.   Eyes: Pupils are equal, round, and reactive to light. Conjunctivae and EOM are normal.   Neck: Normal range of motion. Neck supple.   Cardiovascular: Normal rate, regular rhythm, normal heart sounds and intact distal pulses.   Pulmonary/Chest: Effort normal and breath sounds normal. No respiratory distress.   Abdominal: Soft. Bowel sounds are normal. There is no tenderness.   Musculoskeletal: Normal range of motion.   Neurological: He is alert and oriented to person, place, and time. Coordination normal.   Skin: Skin is warm and dry. Capillary refill takes less than 2 seconds.   Psychiatric: He has a normal mood and affect.     Diagnostics   Labs:  Results for orders placed or performed during the hospital encounter of 06/04/18   POCT Glucose   Result Value Ref Range    POC Glucose 249 (H) 70 - 100 mg/dL     Radiographs:  No results found.    Procedures:  Procedures    ED Course and MDM         In brief, Mason Garza is a 52 y.o. male who presented to the emergency department after accidental drug overdose given Narcan. Patient endorses snorting fentanyl for his chronic pain. No attempt to harm himself. Upon arrival he is alert and oriented ??3, no distress, patient will be monitored for one hour and then  reassess.    Reassessment-patient resting comfortably in bed, no distress, vital signs are stable, clinically sober. Calling for a ride, given information for addiction medicine.    I estimate there is LOW risk for (including but not limited to) ISCHEMIC or HEMORRHAGIC CVA, ACUTE CORONARY SYNDROME, UNSTABLE CARDIAC RHYTHM, PULMONARY EMBOLISM, RUPTURED or RAPIDLY EXPANDING AAA, SEVERE ANEMIA  thus I consider the discharge disposition reasonable. [name] (or their surrogate) and I have discussed the diagnosis and risks, and we agree with discharging home with close follow-up. We also discussed returning to the Emergency Department immediately if new or worsening symptoms occur. We have discussed the symptoms which are most concerning that necessitate immediate return.    ED Medication Orders (From admission, onward)    None        Final Impression      1. Narcotic overdose, accidental or unintentional, initial encounter [facility])        DISPOSITION Decision To Discharge 06/04/2018 07:40:17 PM     Patient seen independently with an Emergency Medicine attending available for supervision.    (Please note that portions of this note may have been completed with a voice recognition program. Efforts were made to edit the dictations but occasionally words aremis-transcribed.)    Alroy Dust, APRN - CNP  [facility] [facility]     Lizzet Hendley A [place], APRN - CNP  06/04/18 1945

## 2018-06-04 NOTE — Discharge Instructions (Addendum)
Please refrain from drug use.  Follow up with referred addiction medicine referrals.    In the medical field, there is always a level of diagnostic uncertainty, even if this uncertainty is low. For this reason, it is important to immediately return to the emergency department if you have any new symptoms, worsening symptoms, change of symptoms, or if you have any other concerns. We would be happy to re-evaluate you. Otherwise, please take your medications as prescribed and follow-up as recommended.

## 2018-06-04 NOTE — ED Notes (Signed)
Bed: 12  Expected date:   Expected time:   Means of arrival:   Comments:  Ems: OD     Lowanda Foster, RN  06/04/18 1754

## 2018-06-04 NOTE — Other (Unsigned)
Patient Acct Nbr: [id number]   Primary AUTH/CERT:   Primary Insurance Company Name: [employer]  Primary Insurance Plan name: [facility]  Primary Insurance Group Number: [id number]  Primary Insurance Plan Type: Health  Primary Insurance Policy Number: [id number]

## 2018-06-06 ENCOUNTER — Inpatient Hospital Stay: Admit: 2018-06-06 | Discharge: 2018-06-07 | Disposition: A | Discharge status: Home / Self Care

## 2018-06-06 DIAGNOSIS — T40601A Poisoning by unspecified narcotics, accidental (unintentional), initial encounter: Secondary | ICD-10-CM

## 2018-06-06 NOTE — Other (Unsigned)
Patient Acct Nbr: [id number]   Primary AUTH/CERT:   Primary Insurance Company Name: [employer]  Primary Insurance Plan name: [facility]  Primary Insurance Group Number: [facility]  Primary Insurance Plan Type: Health  Primary Insurance Policy Number: [id number]

## 2018-06-06 NOTE — ED Provider Notes (Signed)
[facility] EMERGENCY DEPT  eMERGENCY dEPARTMENT eNCOUnter      Pt Name: Mason Garza  MRN: [id number]  [name] [birth year]  Date of evaluation: 06/06/2018  Provider: Gaylan Gerold, [place]       Chief Complaint   Patient presents with   ??? Drug Overdose     heroin, was given 29m narcan by EMS, 4 IN and 2 IM       I evaluated this patient on my own, per my scope of practice with an ED attending available for consultation.     HISTORY OF PRESENT ILLNESS   (Location/Symptom, Timing/Onset,Context/Setting, Quality, Duration, Modifying Factors, Severity) Note limiting factors.   HPI    Mason Garza a 52y.o. male who presents to the emergency department after opiate overdose. [facility]. He received 670mof narcan. Only complaint is a slight headache.     Nursing Notes were reviewed.    REVIEW OF SYSTEMS    (2+ forlevel 4; 10+ for level 5)     Review of Systems at least 10 systems reviewed and otherwise acutely negative except as stated in [place]    PAST MEDICAL HISTORY     Past Medical History:   Diagnosis Date   ??? Chronic hepatitis C (HCIder04/01/2016    active IVDU   ??? H/O echocardiogram 05/12/11    EF62%, mild LAE   ??? MRSA (methicillin resistant Staphylococcus aureus) infection 10/05/2017   ??? Neuropathy    ??? 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 Psychiatric problem    ??? Type II or unspecified type diabetes mellitus without mention of complication, not stated as uncontrolled        SURGICALHISTORY       Past Surgical History:   Procedure Laterality Date   ??? OTHER SURGICAL HISTORY Bilateral 10/06/2017    Irrigation and Debridement of Abscesses arms and scalp       CURRENT MEDICATIONS       Previous Medications    ALCOHOL SWABS (ALCOHOL PADS) 70 % PADS    1 Package by Does not apply route 3 times daily (before meals)    BLOOD GLUCOSE MONITOR STRIPS    Test 3 times a day & as needed for symptoms of irregular blood glucose.dispense  as covered by insurance    GLUCOSE MONITORING KIT (FREESTYLE) MONITORING KIT    1 kit by Does not apply route daily Dispense as covered by insurance    LANCETS MISC    Dispense lancet device DISPENSE AS COVERED BY INSURANCE    LANCETS MISC    1 each by Does not apply route 3 times daily (before meals) DISPENSE AS COVERED BY INSURANCE    NAPROXEN (NAPROSYN) 500 MG TABLET    Take 1 tablet by mouth 2 times daily as needed for Pain       ALLERGIES     Penicillins    FAMILY HISTORY       Family History   Problem Relation Age of Onset   ??? Lung Cancer Mother 7040 ??? Colon Cancer Father 6547 ??? Cancer Brother [id number]      renal cell cancer          SOCIAL HISTORY       Social History     Socioeconomic History   ??? Marital status: Divorced  Spouse name: None   ??? Number of children: None   ??? Years of education: None   ??? Highest education level: None   Occupational History   ??? None   Social Needs   ??? Financial resource strain: None   ??? Food insecurity:     Worry: None     Inability: None   ??? Transportation needs:     Medical: None     Non-medical: None   Tobacco Use   ??? Smoking status: Current Every Day Smoker     Packs/day: 1.00     Years: 30.00     Pack years: 30.00     Types: Cigarettes   ??? Smokeless tobacco: Never Used   ??? Tobacco comment: nicotene patch requested   Substance and Sexual Activity   ??? Alcohol use: No     Alcohol/week: 0.0 standard drinks     Comment: 6 pack per yr now; former 6-12 pk per day for 10 yrs   ??? Drug use: Yes     Frequency: 10.0 times per week     Types: Opiates , IV, Methamphetamines     Comment: heroin IVDU daily   ??? Sexual activity: Yes     Partners: Female   Lifestyle   ??? Physical activity:     Days per week: None     Minutes per session: None   ??? Stress: None   Relationships   ??? Social connections:     Talks on phone: None     Gets together: None     Attends religious service: None     Active member of club or organization: None     Attends meetings of clubs or organizations: None      Relationship status: None   ??? Intimate partner violence:     Fear of current or ex partner: None     Emotionally abused: None     Physically abused: None     Forced sexual activity: None   Other Topics Concern   ??? None   Social History Narrative    In [place] 12/2017    Looking for a place to live       SCREENINGS           PHYSICAL EXAM    (5+ for level 4, 8+ for level 5)     ED Triage Vitals [06/06/18 1851]   BP Temp Temp Source Pulse Resp SpO2 Height Weight   (!) 146/92 97.5 ??F (36.4 ??C) Oral 96 16 96 % 5' 11"  (1.803 m) 185 lb (83.9 kg)       Physical Exam  GENERAL:  The patient is sitting upright, no apparent distress, appears nourished and normally developed. Vital signs as documented.     EYES:   Head exam is unremarkable. No scleral icterus or orbital trauma noted.     HEENT:  Large motion with no limitations.  Speaking in full, clear sentences.  Mucous membranes moist. Nares patent without copious rhinorrhea.  No enlarged lymphadenopathy.      LUNGS:  Lungs are clear to auscultation, without any respiratory distress.  No wheezing, rales or rhonchi.  No crackles.  Nonlabored breathing.  90% on room air.    CARDIAC:   Rhythm is regular, rate 71. No dysrythmias or murmurs.     ABDOMEN:  Nontender with no obvious masses, and no peritoneal signs.      EXTREMITIES:   Non edematous, with no obvious deformities.    SKIN:  Good color, with no significant rashes.  No pallor.    NEURO: Alert and oriented ??3.  No obvious neurological deficits, normal sensation and strength bilaterally.  patient able to ambulate.    DIAGNOSTIC RESULTS     EKG (Per Emergency Physician)       RADIOLOGY (Per Emergency Physician):       Interpretation per the Radiologist below, if available at the time of this note:  No results found.    LABS:  Labs Reviewed   POCT GLUCOSE - Abnormal; Notable for the following components:       Result Value    POC Glucose 237 (*)     All other components within normal limits    Narrative:     Test  Performed by [facility], [address] [place]  [address]   POCT GLUCOSE - Normal       All other labs were within normal range or not returned as of this dictation.    EMERGENCY DEPARTMENT COURSE and DIFFERENTIAL DIAGNOSIS/MDM:   Vitals:    Vitals:    06/06/18 1851 06/06/18 2055   BP: (!) 146/92 111/75   Pulse: 96 71   Resp: 16 15   Temp: 97.5 ??F (36.4 ??C)    TempSrc: Oral    SpO2: 96% 98%   Weight: 83.9 kg (185 lb)    Height: 5' 11"  (1.803 m)        Medications   acetaminophen (TYLENOL) tablet 1,000 mg (1,000 mg Oral Given 06/06/18 2055)       MDM.  Patient is not septic or toxic appearing.  Vital stable.  Afebrile. Was observed in the ED for multiple hours and did not develop any adverse effects from the narcan. No respiratory distress. No adventitious lung sounds, hypoxia, tachycardia, hypotension. Highly encouraged to stop using drugs. Does not want detox at this time. He is to return to the ED as needed. He understands and agrees with the discharge decision, discharged home in stable condition.     CONSULTS:  None    PROCEDURES:  Unless otherwise noted below, none     Procedures    FINAL IMPRESSION      1. Opiate overdose, accidental or unintentional, initial encounter [facility])          DISPOSITION/PLAN   DISPOSITION Decision To Discharge 06/06/2018 09:27:58 PM      PATIENT REFERRED TO:  Omelia Blackwater, MD  [address]  [place] [address]  [phone]            DISCHARGE MEDICATIONS:  New Prescriptions    No medications on file          (Pleasenote:  Portions of this note were completed with a voice recognition program.  Efforts were made toedit the dictations but occasionally words and phrases are mis-transcribed.)    Form [id number]    Gaylan Gerold, PA-C (electronically signed)  Emergency Medicine Provider             Gaylan Gerold, PA-C  06/06/18 2139

## 2018-06-06 NOTE — ED Notes (Signed)
Patient is in bed with monitor on, rails up x2 and call light in reach.     Kosta Schnitzler A. Jamisha Hoeschen, RN  06/06/18 1909

## 2018-06-07 LAB — POCT GLUCOSE
Glucose: 237 mg/dL
POC Glucose: 237 mg/dL — ABNORMAL HIGH (ref 70–100)

## 2018-06-07 MED ORDER — ACETAMINOPHEN 500 MG PO TABS
500 MG | Freq: Once | ORAL | Status: AC
Start: 2018-06-07 — End: 2018-06-06
  Administered 2018-06-07: 01:00:00 1000 mg via ORAL

## 2018-06-07 MED FILL — MAPAP 500 MG PO TABS: 500 mg | ORAL | Qty: 2

## 2018-08-04 ENCOUNTER — Encounter: Payer: MEDICAID | Attending: Internal Medicine | Primary: Internal Medicine

## 2019-01-25 ENCOUNTER — Ambulatory Visit
Admit: 2019-01-25 | Discharge: 2019-01-25 | Payer: PRIVATE HEALTH INSURANCE | Attending: Family | Primary: Internal Medicine

## 2019-01-25 DIAGNOSIS — L03114 Cellulitis of left upper limb: Secondary | ICD-10-CM

## 2019-01-25 MED ORDER — NAPROXEN 500 MG PO TABS
500 MG | ORAL_TABLET | Freq: Two times a day (BID) | ORAL | 0 refills | Status: DC
Start: 2019-01-25 — End: 2019-11-07

## 2019-01-25 MED ORDER — DOXYCYCLINE HYCLATE 100 MG PO TABS
100 MG | ORAL_TABLET | Freq: Two times a day (BID) | ORAL | 0 refills | Status: AC
Start: 2019-01-25 — End: 2019-02-04

## 2019-01-25 NOTE — Progress Notes (Signed)
[facility]  [address]  [place] [address]  Dept: [phone]  Dept Fax: [phone]  Loc: [phone]   Subjective   Mason Garza is a 53 y.o.yearold who presents to the office with the following complaint(s): Arm Pain (infection in left arm)     HPI:  Patient comes into the clinic today with c/o left arm infection. Patient states that it has been going on for a week and feels like it is slightly improving but the "tight feeling" is painful and wants something for pain. Patient has not taken any OTC medications for comfort measures. Hx DM 2, Hep C, polysubstance abuse. Denies numbness, tingling or weakness to his left arm. Denies lightheadedness, dizziness, shortness of breath, chest pain.     Review of Systems   Constitutional: Negative for activity change, appetite change, chills, diaphoresis, fatigue and fever.   Respiratory: Negative for chest tightness and shortness of breath.    Cardiovascular: Negative for chest pain.   Endocrine:        H/o DM type 2   Musculoskeletal: Positive for myalgias (LEFT lower arm). Negative for arthralgias.   Skin: Positive for color change (LEFT lower arm).   Allergic/Immunologic: Negative for immunocompromised state.   Neurological: Negative for dizziness, weakness, light-headedness and headaches.   Hematological: Negative for adenopathy.   All other systems reviewed and are negative.     Allergies   Allergen Reactions   ??? Penicillins Hives     Current Outpatient Medications on File Prior to Visit   Medication Sig Dispense Refill   ??? glucose monitoring kit (FREESTYLE) monitoring kit 1 kit by Does not apply route daily Dispense as covered by insurance 1 kit 0   ??? blood glucose monitor strips Test 3 times a day & as needed for symptoms of irregular blood glucose.dispense as covered by insurance 100 strip 5   ??? Lancets MISC Dispense lancet device DISPENSE AS COVERED BY INSURANCE 1 each 3   ??? Lancets MISC 1 each  by Does not apply route 3 times daily (before meals) DISPENSE AS COVERED BY INSURANCE 100 each 11   ??? Alcohol Swabs (ALCOHOL PADS) 70 % PADS 1 Package by Does not apply route 3 times daily (before meals) 100 each 5     No current facility-administered medications on file prior to visit.       Patient Active Problem List   Diagnosis   ??? Hepatitis C, Ab + no rna testing   ??? IVDU (intravenous drug user)   ??? Type 2 diabetes mellitus with diabetic neuropathy ([name])   ??? Tobacco use   ??? Neuropathy   ??? Depression   ??? Insomnia   ??? Not immune to hepatitis B virus   ??? Chronic hepatitis C ([name])   ??? Generalized weakness   ??? Nausea   ??? Heart palpitations   ??? Cellulitis and abscess of right lower extremity   ??? Severe opioid use disorder ([name])   ??? Intravenous drug abuse ([name])   ??? Polysubstance abuse ([name])      Social History     Tobacco Use   ??? Smoking status: Current Every Day Smoker     Packs/day: 1.00     Years: 30.00     Pack years: 30.00     Types: Cigarettes   ??? Smokeless tobacco: Never Used   ??? Tobacco comment: nicotene patch requested   Substance Use Topics   ???  Alcohol use: No     Alcohol/week: 0.0 standard drinks     Comment: 6 pack per yr now; former 6-12 pk per day for 10 yrs      Objective   BP 90/62 (Site: Left Upper Arm, Position: Sitting, Cuff Size: Large Adult)    Pulse 85    Temp 98.2 ??F (36.8 ??C)    Resp 20    Ht 5' 11"  (1.803 m)    Wt 179 lb 12.8 oz (81.6 kg)    SpO2 98%    BMI 25.08 kg/m??   Physical Exam  Vitals signs reviewed.   Constitutional:       General: He is awake. He is not in acute distress.     Appearance: Normal appearance. He is well-developed and well-groomed. He is not ill-appearing, toxic-appearing or diaphoretic.   HENT:      Head: Normocephalic and atraumatic.   Eyes:      General: No scleral icterus.     Conjunctiva/sclera: Conjunctivae normal.   Neck:      Musculoskeletal: Normal range of motion and neck supple.   Pulmonary:      Effort: Pulmonary effort is normal.   Musculoskeletal: Normal  range of motion.      Left forearm: He exhibits tenderness and swelling. He exhibits no bony tenderness, no edema and no deformity.   Skin:     General: Skin is warm and dry.      Findings: Erythema present. No lesion or wound.      Comments: Oval 3cm diameter erythematous, warm, swollen area. There are no red streaks visualized. There are multiple old scarred wounds to both arms.    Neurological:      General: No focal deficit present.      Mental Status: He is alert and oriented to person, place, and time.   Psychiatric:         Mood and Affect: Mood normal.         Behavior: Behavior normal. Behavior is cooperative.         Assessment/Plan   1. Cellulitis of left arm  - doxycycline hyclate (VIBRA-TABS) 100 MG tablet; Take 1 tablet by mouth 2 times daily for 10 days  Dispense: 20 tablet; Refill: 0     Advised Patient that BP was on low side and if he becomes symptomatic, he needs to go to the ER. Advised him that if the area does not look to be improving or he notices red streak or experiences fever/chills, these would be other reasons to go to the ER. He is to scheduled appt with PCP in 3-5 days for a follow up evaluation. Patient states understanding. Patient requesting pain meds and it was explained that ibuprofen or naproxen were only ones we could prescribed here in the UC, Patient requested naproxen. Patient states understanding to the reasons he should go to the ER and agrees to this plan of care.     Patient given educational materials - see patient instructions.  Discussed use, benefit, and side effects of prescribed medications.  All patient questions answered.  Pt voiced understanding and agrees with treatmentplan.Follow up as directed.    Foye Spurling, APRN - NP  01/25/19  10:20 AM

## 2019-01-25 NOTE — Patient Instructions (Addendum)
If you feel that the area is worsening or if you experience fever/chills, shortness of breath, dizziness, lightheadedness, or chest pain, go to the Emergency Room.     Call your family doctor today to make an appointment within 3-5 days for a follow up evaluation.     In the medical field, there is always a level of diagnostic uncertainty,  even if this uncertainty is low. For this reason, it is important to  immediately go to the emergency department if you have any new  symptoms, worsening symptoms, change of symptoms, or if you have  any other concerns. They would be happy to evaluate you. Otherwise,  please take your medications as prescribed and follow-up as  recommended.    Patient Education        Cellulitis: Care Instructions  Your Care Instructions     Cellulitis is a skin infection caused by bacteria, most often strep or staph. It often occurs after a break in the skin from a scrape, cut, bite, or puncture, or after a rash.  Cellulitis may be treated without doing tests to find out what caused it. But your doctor may do tests, if needed, to look for a specific bacteria, like methicillin-resistant Staphylococcus aureus (MRSA).  The doctor has checked you carefully, but problems can develop later. If you notice any problems or new symptoms, get medical treatment right away.  Follow-up care is a key part of your treatment and safety. Be sure to make and go to all appointments, and call your doctor if you are having problems. It's also a good idea to know your test results and keep a list of the medicines you take.  How can you care for yourself at home?  ?? Take your antibiotics as directed. Do not stop taking them just because you feel better. You need to take the full course of antibiotics.  ?? Prop up the infected area on pillows to reduce pain and swelling. Try to keep the area above the level of your heart as often as you can.  ?? If your doctor told you how to care for your wound, follow your doctor's  instructions. If you did not get instructions, follow this general advice:  ? Wash the wound with clean water 2 times a day. Don't use hydrogen peroxide or alcohol, which can slow healing.  ? You may cover the wound with a thin layer of petroleum jelly, such as Vaseline, and a nonstick bandage.  ? Apply more petroleum jelly and replace the bandage as needed.  ?? Be safe with medicines. Take pain medicines exactly as directed.  ? If the doctor gave you a prescription medicine for pain, take it as prescribed.  ? If you are not taking a prescription pain medicine, ask your doctor if you can take an over-the-counter medicine.  To prevent cellulitis in the future  ?? Try to prevent cuts, scrapes, or other injuries to your skin. Cellulitis most often occurs where there is a break in the skin.  ?? If you get a scrape, cut, mild burn, or bite, wash the wound with clean water as soon as you can to help avoid infection. Don't use hydrogen peroxide or alcohol, which can slow healing.  ?? If you have swelling in your legs (edema), support stockings and good skin care may help prevent leg sores and cellulitis.  ?? Take care of your feet, especially if you have diabetes or other conditions that increase the risk of infection. Wear shoes and  socks. Do not go barefoot. If you have athlete's foot or other skin problems on your feet, talk to your doctor about how to treat them.  When should you call for help?   Call your doctor now or seek immediate medical care if:  ?? You have signs that your infection is getting worse, such as:  ? Increased pain, swelling, warmth, or redness.  ? Red streaks leading from the area.  ? Pus draining from the area.  ? A fever.  ?? You get a rash.  Watch closely for changes in your health, and be sure to contact your doctor if:  ?? You do not get better as expected.  Where can you learn more?  Go to [email or web address] and sign in to your MyChart account. Enter [phone] in the Search Health  Information box to learn more about "Cellulitis: Care Instructions."     If you do not have an account, please click on the "Sign Up Now" link.  Current as of: June 17, 2018??????????????????????????????Content Version: 12.5  ?? 2006-2020 [employer].   Care instructions adapted under license by [facility]. If you have questions about a medical condition or this instruction, always ask your healthcare professional. [employer] disclaims any warranty or liability for your use of this information.

## 2019-01-29 NOTE — Addendum Note (Signed)
Addended by: Elton Sin on: 01/29/2019 09:42 AM     Modules accepted: Level of Service

## 2019-08-25 IMAGING — CT CT ABD-PELV W/O CM
2 of 4 series · 16 of 46 positions shown, 18 images · non-contrast
Comparison: None.

CLINICAL DATA: Fever and lethargy.

EXAM:
CT ABDOMEN AND PELVIS WITHOUT CONTRAST
TECHNIQUE: Multidetector CT imaging of the abdomen and pelvis was performed
following the standard protocol without IV contrast.

[Series 3: a/p w/o 5mm · axial · non-contrast · 0.85mm/px · z∈[-854,-409]mm · 13 of 99 slices shown, 15 images]
[im 5/99  soft-tissue]
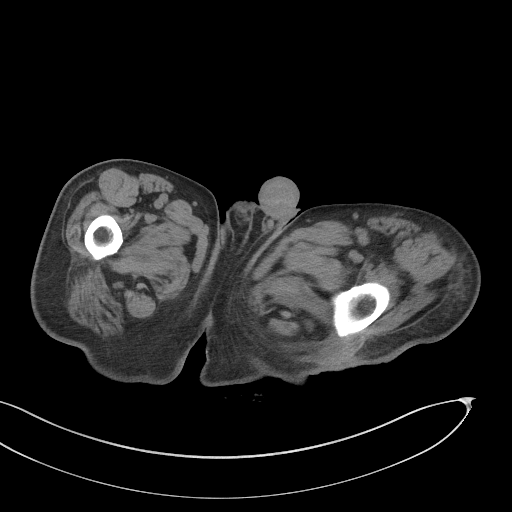
[im 5/99  bone]
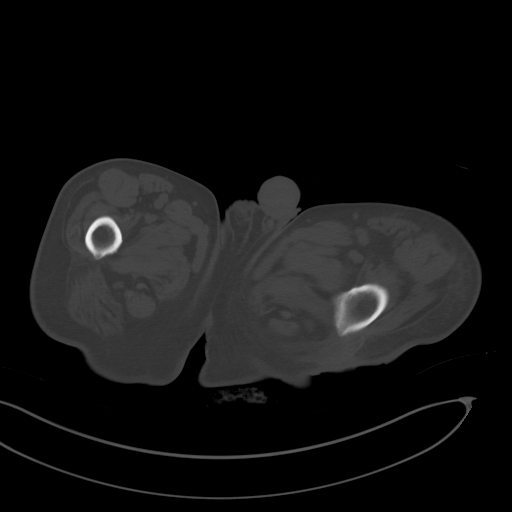
[im 13/99  soft-tissue]
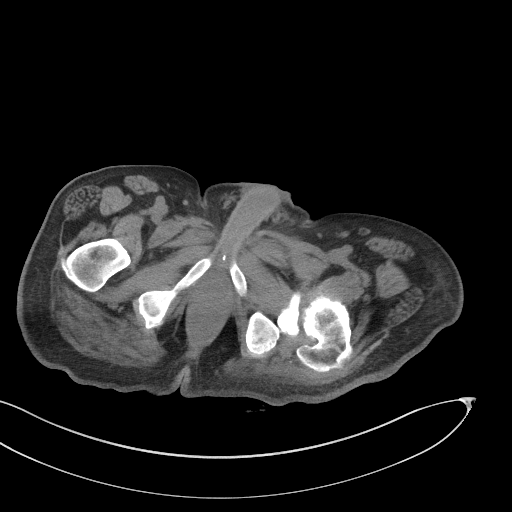
[im 22/99  soft-tissue]
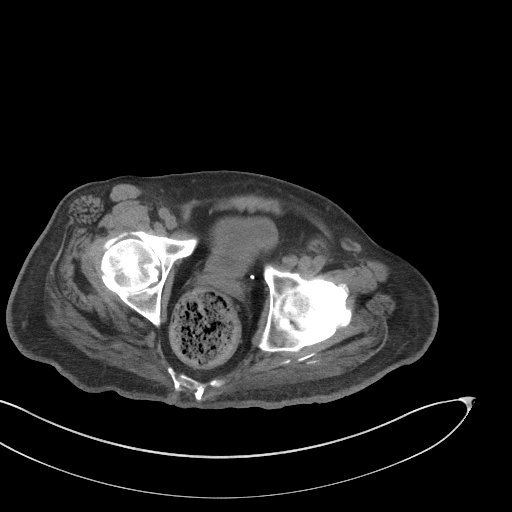
[im 26/99  soft-tissue]
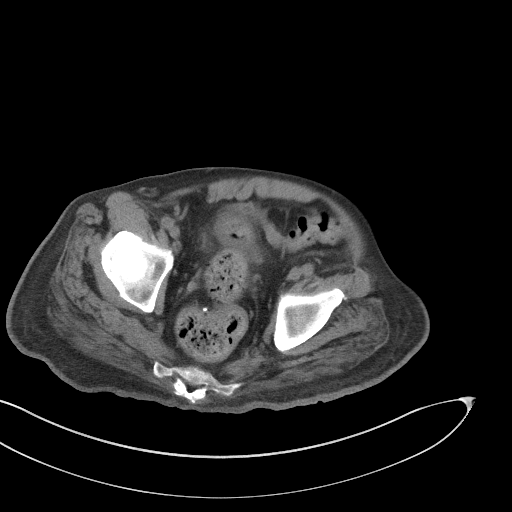
[im 35/99  soft-tissue]
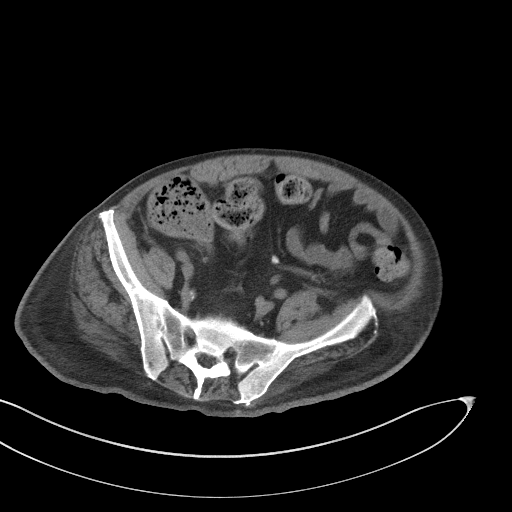
[im 43/99  soft-tissue]
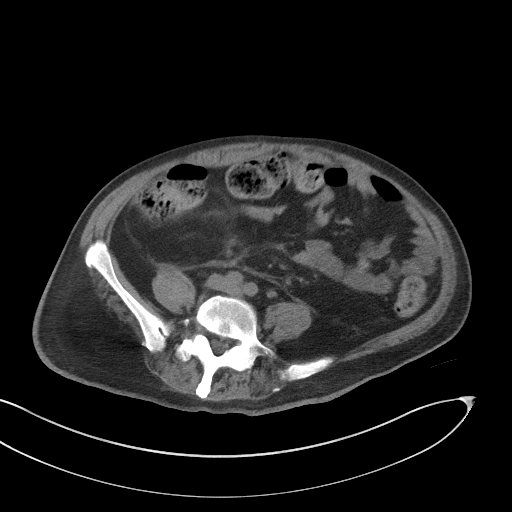
[im 52/99  soft-tissue]
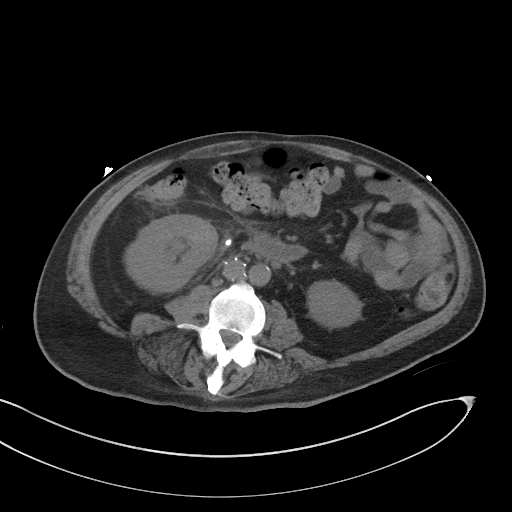
[im 56/99  soft-tissue]
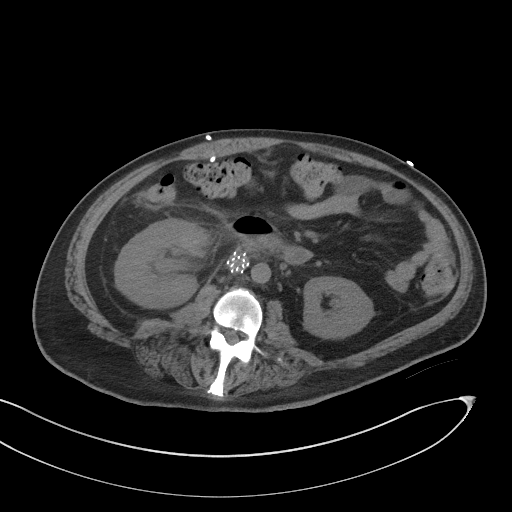
[im 64/99  soft-tissue]
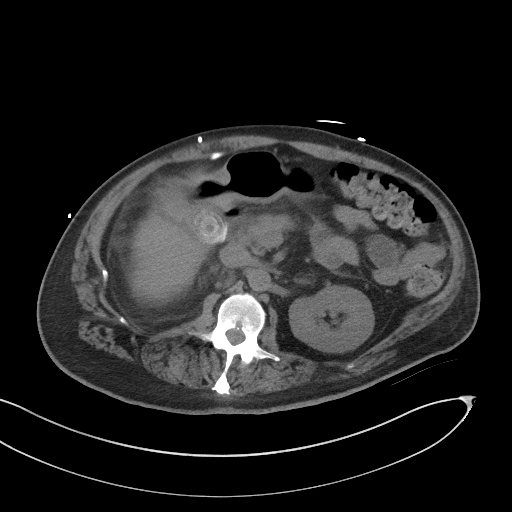
[im 64/99  bone]
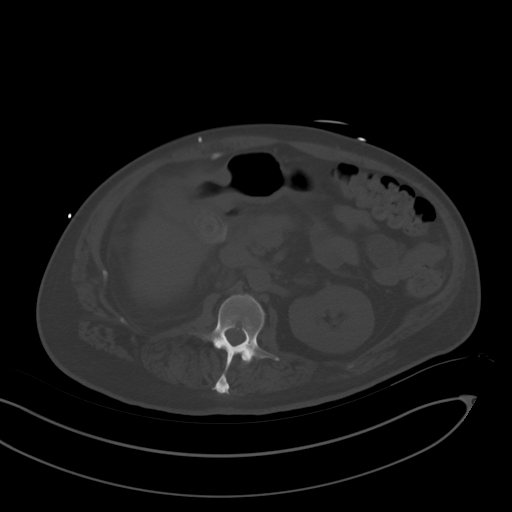
[im 73/99  soft-tissue]
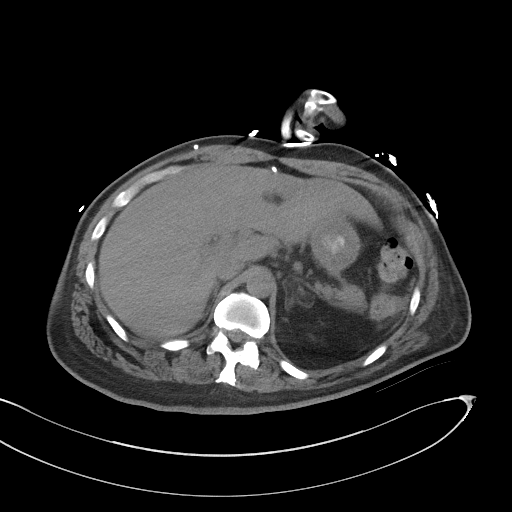
[im 77/99  soft-tissue]
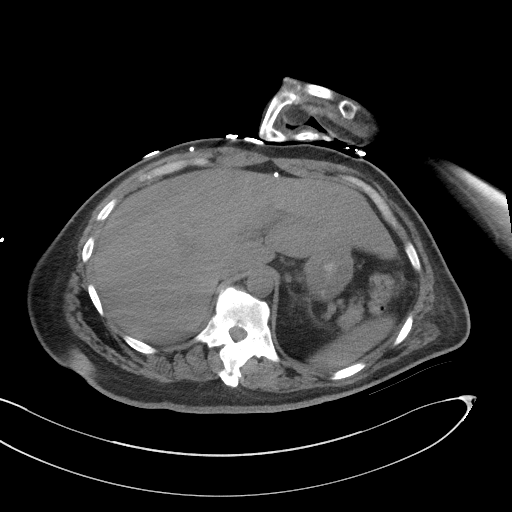
[im 86/99  soft-tissue]
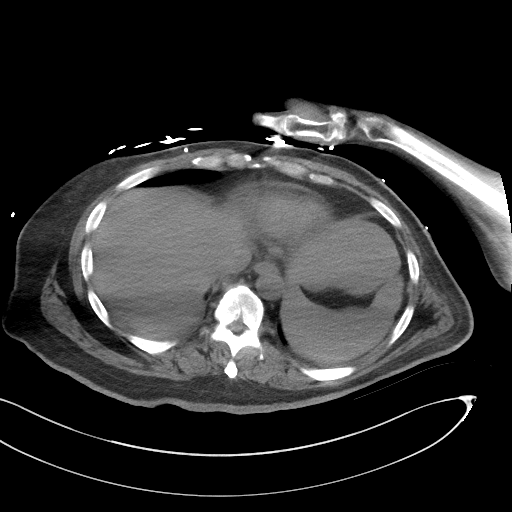
[im 94/99  soft-tissue]
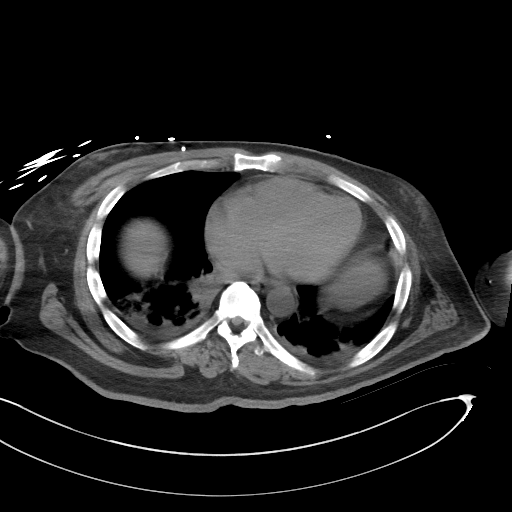

[Series 6: a/p w/o cor · coronal · non-contrast · 0.78mm/px · 3 of 136 slices shown]
[im 46/136  soft-tissue]
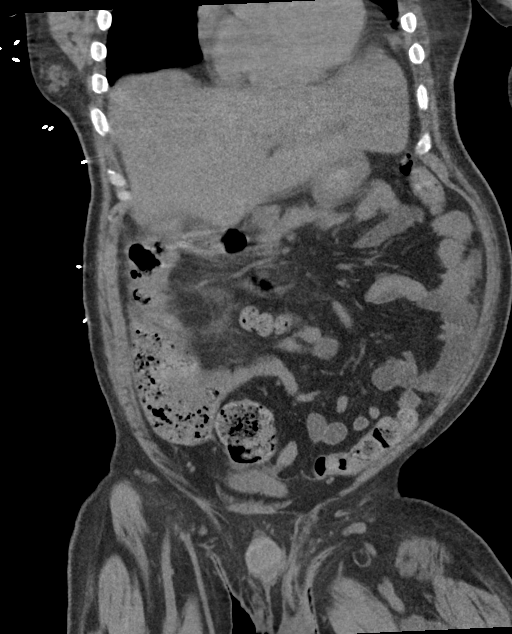
[im 61/136  soft-tissue]
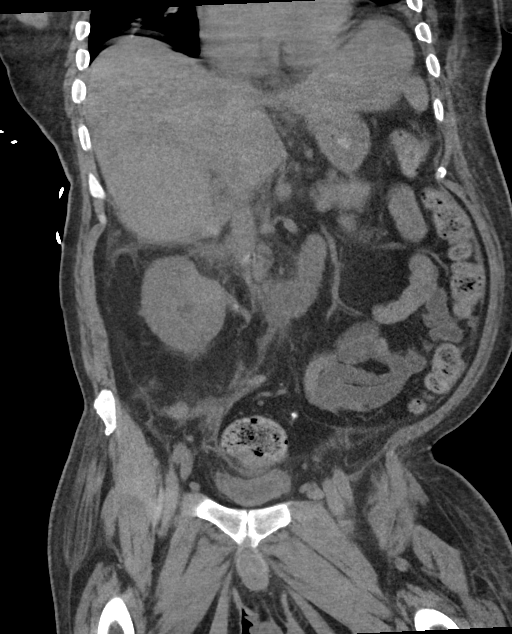
[im 76/136  soft-tissue]
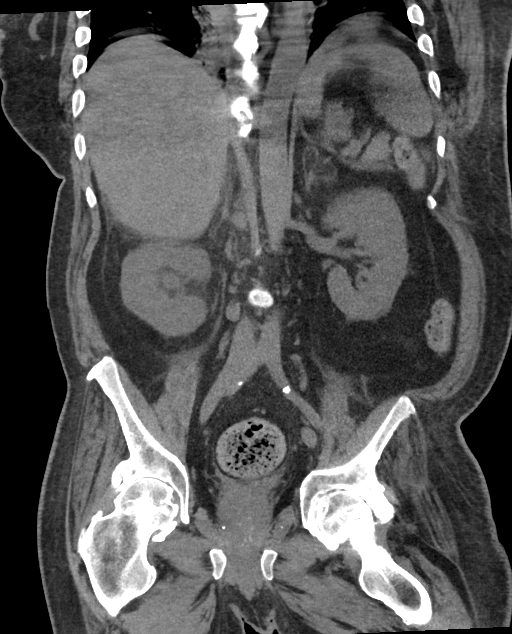

[16 of 46 positions shown; findings below may reference images not displayed]

FINDINGS: LOWER CHEST: Small pleural effusions with bibasilar atelectasis.

HEPATOBILIARY: Normal hepatic contours and density. No intra- or
extrahepatic biliary dilatation. Cholelithiasis without acute
inflammation.

PANCREAS: Normal parenchymal contours without ductal dilatation. No
peripancreatic fluid collection.

SPLEEN: Normal.

ADRENALS/URINARY TRACT:

--Adrenal glands: Left adrenal myelolipoma.

--Right kidney/ureter: There is a 7 x 5 mm stone at the right
ureterovesical junction causing mild hydroureteronephrosis and
perinephric stranding. There are 2-3 other nonobstructing renal
calculi measuring approximately 2 mm.

--Left kidney/ureter: No hydronephrosis, nephroureterolithiasis,
perinephric stranding or solid renal mass.

--Urinary bladder: Normal for degree of distention

STOMACH/BOWEL:

--Stomach/Duodenum: No hiatal hernia or other gastric abnormality.
Normal duodenal course.

--Small bowel: No dilatation or inflammation.

--Colon: No focal abnormality.

--Appendix: Not visualized. No right lower quadrant inflammation or
free fluid.

VASCULAR/LYMPHATIC: Infrarenal IVC filter. Minimal aortic
calcification. No abdominal or pelvic lymphadenopathy.

REPRODUCTIVE: Normal prostate and seminal vesicles.

MUSCULOSKELETAL. Moderate right and severe left hip osteoarthrosis.
Soft tissue thickening extending to the skin surface superficial to
the left greater trochanter (axial image 93).

OTHER: None.
IMPRESSION: 1. Right obstructive uropathy with 7 x 5 mm right ureterovesical
junction stone causing mild hydroureteronephrosis and perinephric
stranding. Multiple nonobstructing right renal calculi measuring up
to 10 mm.
2. Moderate right and severe left hip osteoarthrosis with abnormal
soft tissue attenuation extending to the skin surface overlying the
left greater trochanter. This may indicate early decubitus ulcer
formation.
3. Small pleural effusions and bibasilar atelectasis.
4. Aortic Atherosclerosis ([id number]).

## 2019-08-25 IMAGING — CT CT HEAD W/O CM
4 series · 16 of 47 positions shown, 18 images · non-contrast
Comparison: CT brain 09/19/2007

CLINICAL DATA: Fever lethargy abnormal speech

EXAM:
CT HEAD WITHOUT CONTRAST
TECHNIQUE: Contiguous axial images were obtained from the base of the skull
through the vertex without intravenous contrast.

[Series 3: head without · axial · non-contrast · 0.40mm/px · z∈[-144,-24]mm · 7 of 34 slices shown, 9 images]
[im 5/34  brain]
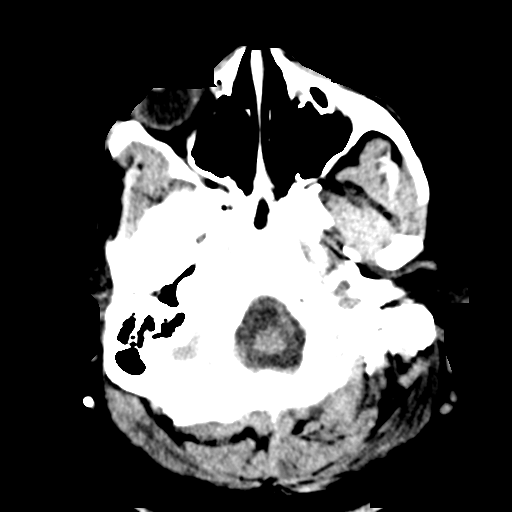
[im 5/34  bone]
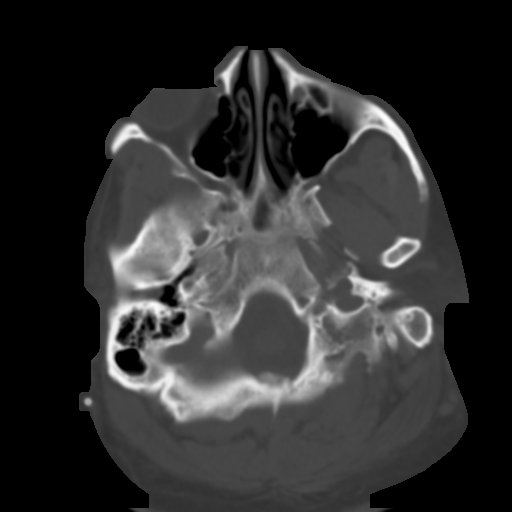
[im 9/34  brain]
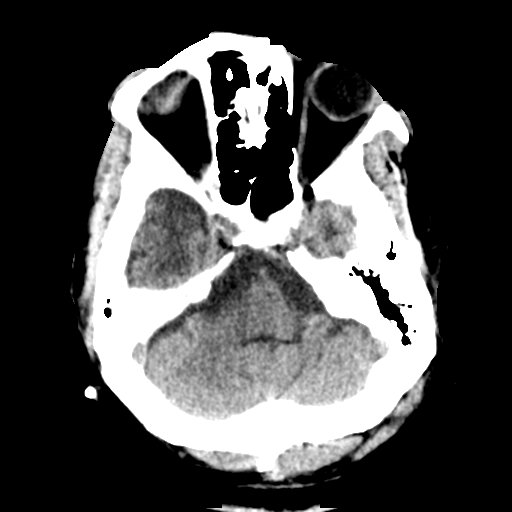
[im 13/34  brain]
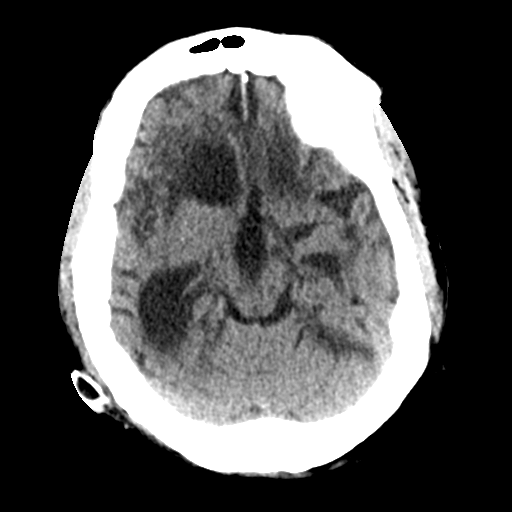
[im 17/34  brain]
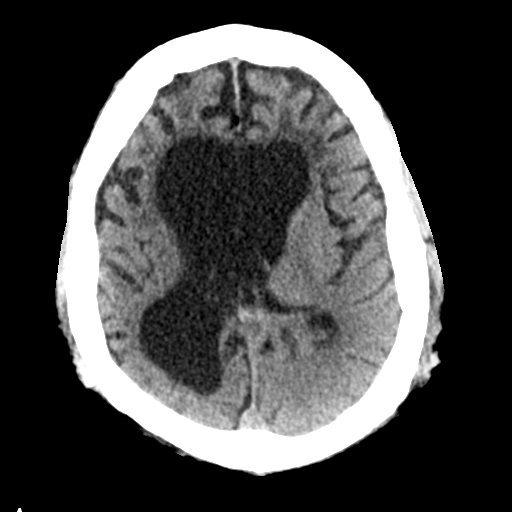
[im 21/34  brain]
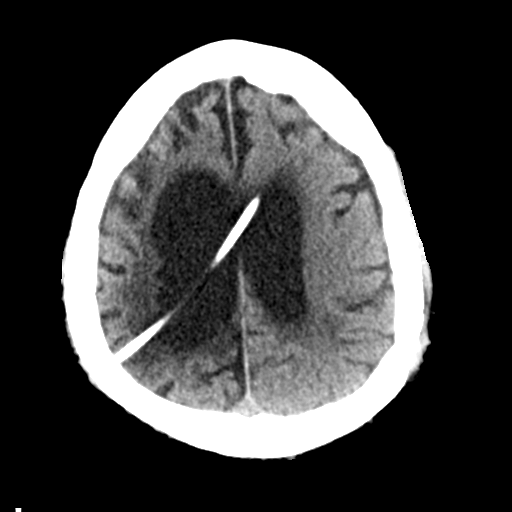
[im 21/34  bone]
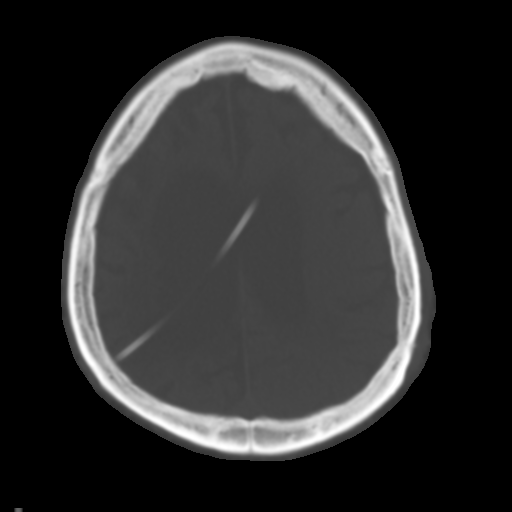
[im 25/34  brain]
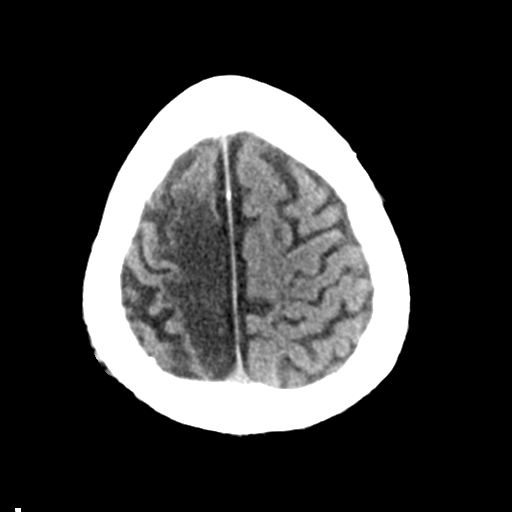
[im 29/34  brain]
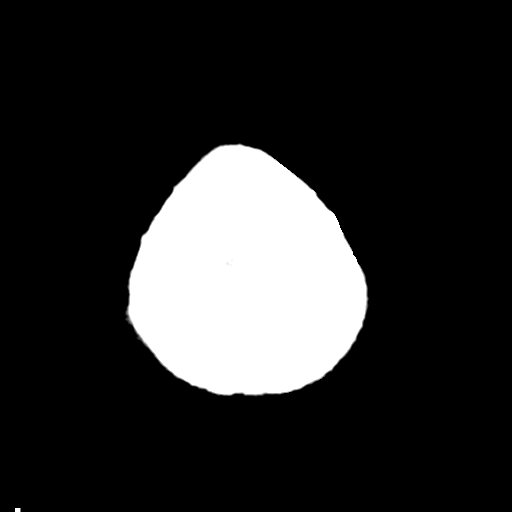

[Series 4: head bone · axial · 0.40mm/px · z∈[-148,-116]mm · 3 of 84 slices shown]
[im 9/84  bone]
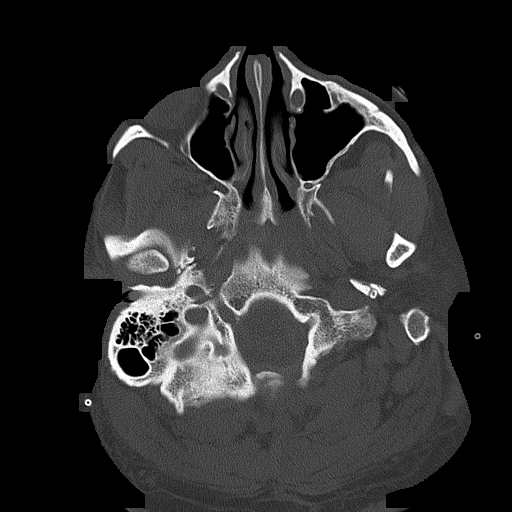
[im 17/84  bone]
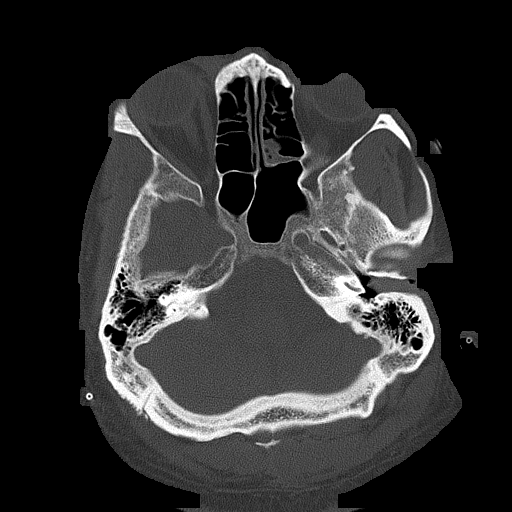
[im 25/84  bone]
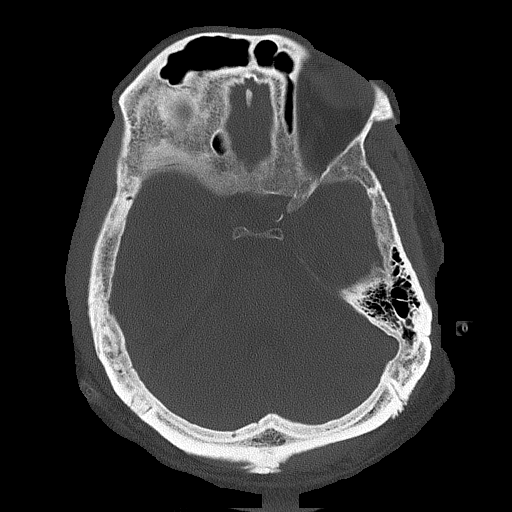

[Series 5: head without cor · coronal · non-contrast · 0.33mm/px · 3 of 67 slices shown]
[im 23/67  brain]
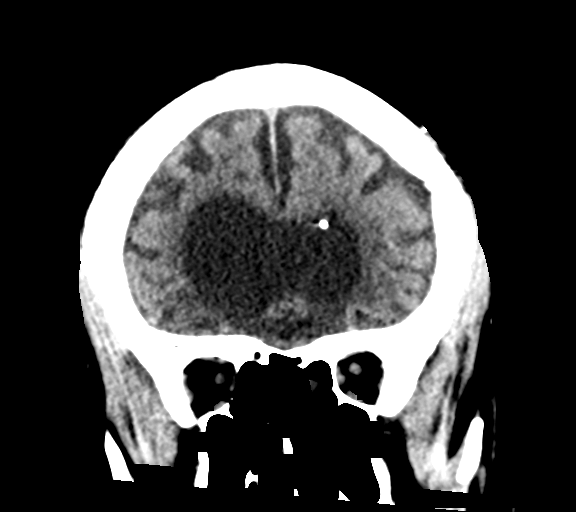
[im 30/67  brain]
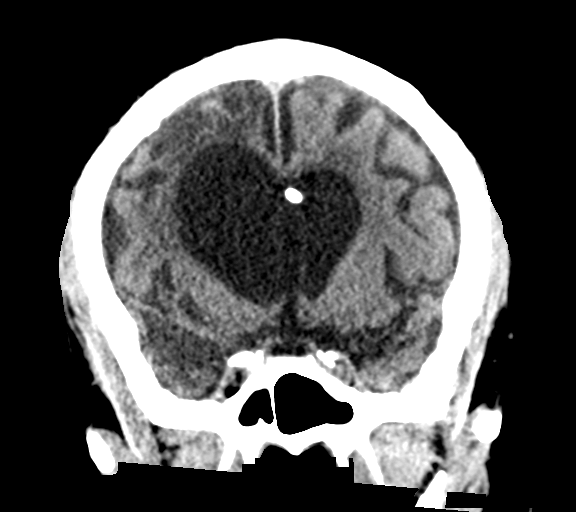
[im 37/67  brain]
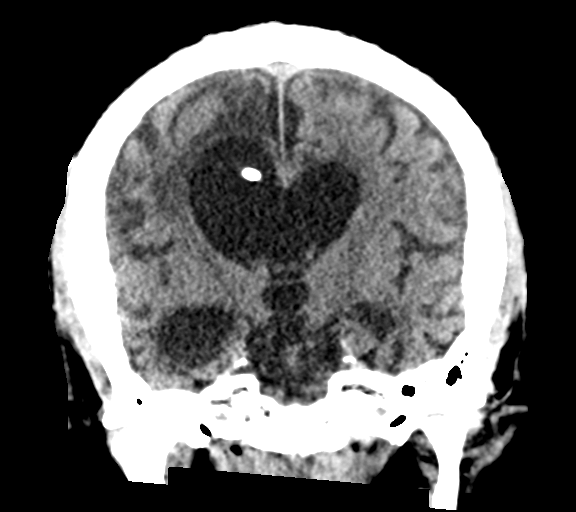

[Series 6: head without sag · sagittal · non-contrast · 0.33mm/px · 3 of 58 slices shown]
[im 21/58  brain]
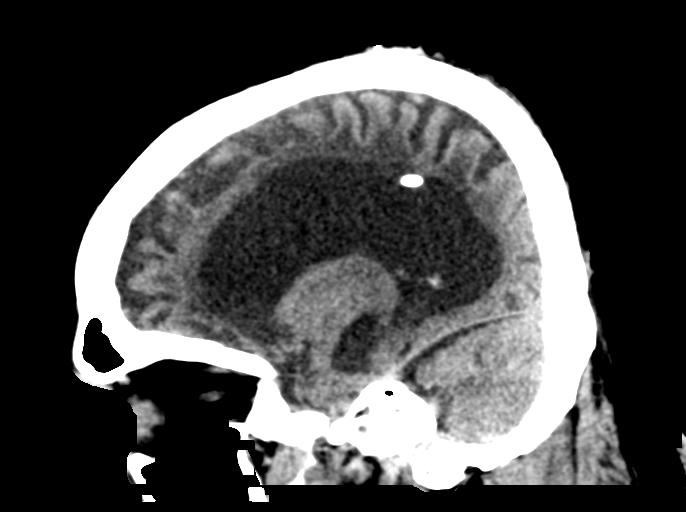
[im 29/58  brain]
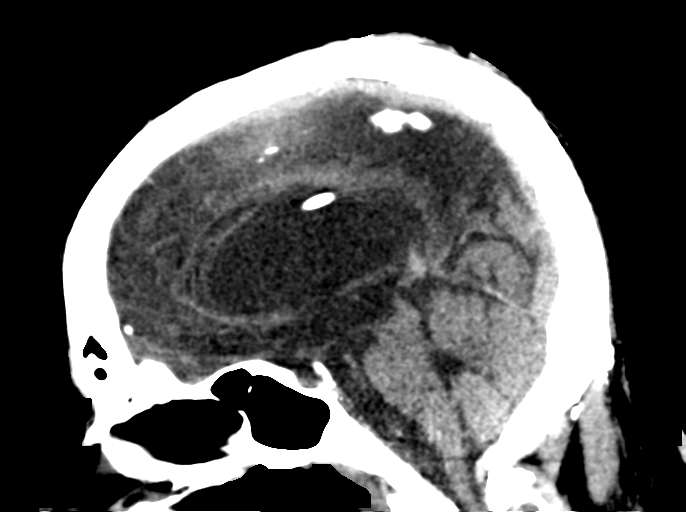
[im 38/58  brain]
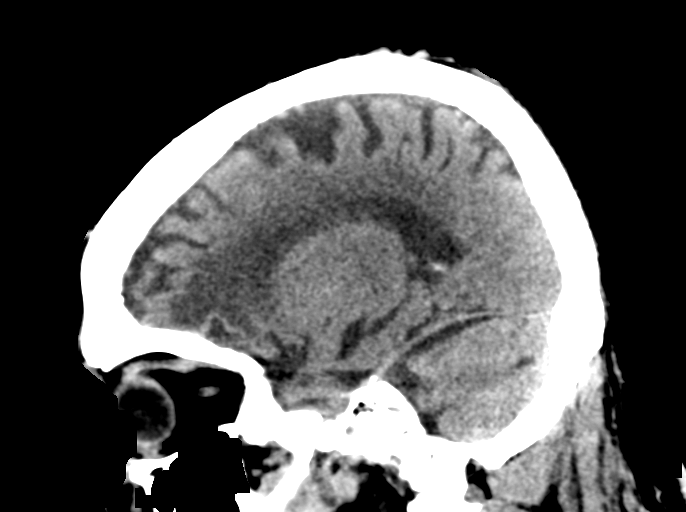

[16 of 47 positions shown; findings below may reference images not displayed]

FINDINGS: Brain: No acute territorial infarction, hemorrhage or intracranial
mass is visualized. Right posterior shunt catheter with tip
terminating in the anterior aspect of the left lateral ventricle.
Extensive encephalomalacia involving the right cerebral hemisphere
with encephalomalacia at the inferior left frontal lobe. Moderate
severe atrophy and volume loss, slight progression since comparison
CT. Slight interval enlargement of the dilated ventricular system,
bifrontal measurement of 6 cm, as compared with 5.5 cm on the
comparison study. No midline shift.

Vascular: No hyperdense vessels.  Carotid vascular calcification

Skull: No fracture.  Old left frontal and parietal burr holes.

Sinuses/Orbits: Mucosal thickening in the ethmoid sinuses. No acute
orbital abnormality.

Other: None
IMPRESSION: 1. Negative for hemorrhage or intracranial mass lesion.
2. Extensive encephalomalacia of the right hemisphere with
encephalomalacia of the left frontal lobe. Cortical volume loss
which appears slightly progressed since the prior CT.
3. Similar appearance and positioning of right posterior shunt
catheter. Slight interval enlargement of the ventricular system as
compared with 1118 CT, it is unclear if this is secondary to
progression of atrophy and volume loss versus some degree of shunt
malfunction.

## 2019-11-07 ENCOUNTER — Inpatient Hospital Stay
Admit: 2019-11-07 | Discharge: 2019-11-11 | Disposition: A | Discharge status: Home / Self Care | Payer: PRIVATE HEALTH INSURANCE | Admitting: Internal Medicine

## 2019-11-07 DIAGNOSIS — L02415 Cutaneous abscess of right lower limb: Principal | ICD-10-CM

## 2019-11-07 DIAGNOSIS — L03115 Cellulitis of right lower limb: Secondary | ICD-10-CM

## 2019-11-07 LAB — HEMOGLOBIN A1C
Hemoglobin A1C: 7.9 % — AB
Hemoglobin A1C: 7.9 % — AB
eAG: 180 mg/dL
eAG: 180 mg/dL

## 2019-11-07 LAB — URINE DRUG SCREEN
Amphetamines, urine: POSITIVE NA
Barbiturates, Urine: NEGATIVE NA
Benzodiazepine Ur Qual: NEGATIVE NA
Cocaine Metabolites, Ur: POSITIVE NA
Methadone, Urine: POSITIVE NA
Opiates, Urine: POSITIVE NA
Oxycodone Screen, Ur: NEGATIVE NA
PCP, Urine: NEGATIVE NA

## 2019-11-07 LAB — CBC WITH AUTO DIFFERENTIAL
Absolute Baso #: 0.1 10*3/uL (ref 0.0–0.2)
Absolute Eos #: 0 10*3/uL (ref 0.0–0.5)
Absolute Lymph #: 1.3 10*3/uL (ref 1.0–4.3)
Absolute Mono #: 0.7 10*3/uL (ref 0.0–0.8)
Absolute Neut #: 6.7 10*3/uL (ref 1.8–7.0)
Basophils: 0.8 % (ref 0.0–2.0)
Eosinophils: 0 % — ABNORMAL LOW (ref 1.0–6.0)
Granulocytes %: 77 % (ref 40.0–80.0)
Hematocrit: 38.3 % — ABNORMAL LOW (ref 40.0–52.0)
Hemoglobin: 12.9 g/dL — ABNORMAL LOW (ref 13.0–18.0)
Lymphocyte %: 14.3 % — ABNORMAL LOW (ref 20.0–40.0)
MCH: 28 pg (ref 26.0–34.0)
MCHC: 33.6 % (ref 32.0–36.0)
MCV: 83.4 fL (ref 80.0–98.0)
MPV: 6.9 fL — ABNORMAL LOW (ref 7.4–10.4)
Monocytes: 7.9 % (ref 2.0–10.0)
Platelets: 246 10*3/uL (ref 140–440)
RBC: 4.59 10*6/uL (ref 4.40–5.90)
RDW: 14.2 % (ref 11.5–14.5)
WBC: 8.8 10*3/uL (ref 3.6–10.7)

## 2019-11-07 LAB — HEPATIC FUNCTION PANEL
ALT: 18 U/L (ref 0–49)
AST: 24 U/L (ref 15–46)
Albumin,Serum: 3.9 g/dL (ref 3.5–5.0)
Alkaline Phosphatase: 153 U/L — ABNORMAL HIGH (ref 38–126)
Bilirubin, Direct: 0 mg/dL (ref 0.0–0.3)
Total Bilirubin: 0.8 mg/dL (ref 0.2–1.3)
Total Protein: 7.7 g/dL (ref 6.3–8.2)

## 2019-11-07 LAB — BASIC METABOLIC PANEL
Anion Gap: 6 mmol/L (ref 3–13)
BUN: 16 mg/dL (ref 7–20)
CO2: 30 mmol/L (ref 22–30)
Calcium: 9.1 mg/dL (ref 8.4–10.4)
Chloride: 98 mmol/L (ref 98–107)
Creatinine: 0.71 mg/dL (ref 0.52–1.25)
EGFR IF NonAfrican American: >90 mL/min (ref >60)
Glucose: 233 mg/dL — ABNORMAL HIGH (ref 70–100)
Potassium: 4.6 mmol/L (ref 3.5–5.1)
Sodium: 134 mmol/L — ABNORMAL LOW (ref 135–145)
eGFR African American: >90 mL/min (ref >60)

## 2019-11-07 LAB — RESPIRATORY PANEL, MOLECULAR, WITH COVID-19: Respiratory Panel Molecular, with COVID: NEGATIVE

## 2019-11-07 LAB — ADD ON LAB TEST

## 2019-11-07 LAB — SEDIMENTATION RATE: Sed Rate: 52 mm/h — ABNORMAL HIGH (ref 0–10)

## 2019-11-07 LAB — C-REACTIVE PROTEIN: CRP: 68.5 mg/L — ABNORMAL HIGH (ref 0.0–6.0)

## 2019-11-07 LAB — LACTATE, SEPSIS
Lactic Acid: 1.2 mmol/L (ref 0.7–2.0)
Lactic Acid: 1 mmol/L (ref 0.7–2.0)

## 2019-11-07 LAB — ETHANOL: Ethanol Lvl: <0.01 g/dL (ref 0.000–0.010)

## 2019-11-07 LAB — POCT GLUCOSE: POC Glucose: 112 mg/dL — ABNORMAL HIGH (ref 70–100)

## 2019-11-07 MED ORDER — DICYCLOMINE HCL 20 MG PO TABS
20 MG | Freq: Four times a day (QID) | ORAL | Status: DC | PRN
Start: 2019-11-07 — End: 2019-11-11

## 2019-11-07 MED ORDER — SODIUM CHLORIDE 0.9 % IV BOLUS
0.9 | Freq: Once | INTRAVENOUS | Status: DC
Start: 2019-11-07 — End: 2019-11-07

## 2019-11-07 MED ORDER — DEXTROSE 5 % IV SOLN
5 | INTRAVENOUS | Status: DC | PRN
Start: 2019-11-07 — End: 2019-11-11

## 2019-11-07 MED ORDER — POLYETHYLENE GLYCOL 3350 17 G PO PACK
17 g | Freq: Every day | ORAL | Status: DC | PRN
Start: 2019-11-07 — End: 2019-11-11

## 2019-11-07 MED ORDER — LIDOCAINE VISCOUS HCL 2 % MT SOLN
2 % | Freq: Once | OROMUCOSAL | Status: DC
Start: 2019-11-07 — End: 2019-11-07

## 2019-11-07 MED ORDER — LIDOCAINE HCL (PF) 1 % IJ SOLN
1 % | Freq: Once | INTRAMUSCULAR | Status: DC
Start: 2019-11-07 — End: 2019-11-07

## 2019-11-07 MED ORDER — INSULIN LISPRO 100 UNIT/ML SC SOLN
100 UNIT/ML | Freq: Three times a day (TID) | SUBCUTANEOUS | Status: DC
Start: 2019-11-07 — End: 2019-11-11
  Administered 2019-11-08: 15:00:00 2 [IU] via SUBCUTANEOUS
  Administered 2019-11-08: 18:00:00 3 [IU] via SUBCUTANEOUS
  Administered 2019-11-09 (×2): 2 [IU] via SUBCUTANEOUS
  Administered 2019-11-09: 12:00:00 1 [IU] via SUBCUTANEOUS
  Administered 2019-11-10 (×3): 2 [IU] via SUBCUTANEOUS
  Administered 2019-11-11: 13:00:00 1 [IU] via SUBCUTANEOUS

## 2019-11-07 MED ORDER — PROMETHAZINE HCL 25 MG PO TABS
25 MG | Freq: Four times a day (QID) | ORAL | Status: DC | PRN
Start: 2019-11-07 — End: 2019-11-11

## 2019-11-07 MED ORDER — NORMAL SALINE FLUSH 0.9 % IV SOLN
0.9 % | Freq: Two times a day (BID) | INTRAVENOUS | Status: DC
Start: 2019-11-07 — End: 2019-11-11
  Administered 2019-11-07 – 2019-11-11 (×9): 10 mL via INTRAVENOUS

## 2019-11-07 MED ORDER — LIDOCAINE HCL 1 % IJ SOLN
1 % | Freq: Once | INTRAMUSCULAR | Status: AC
Start: 2019-11-07 — End: 2019-11-07

## 2019-11-07 MED ORDER — INSULIN LISPRO 100 UNIT/ML SC SOLN
100 UNIT/ML | Freq: Every evening | SUBCUTANEOUS | Status: DC
Start: 2019-11-07 — End: 2019-11-11
  Administered 2019-11-08 – 2019-11-10 (×3): 1 [IU] via SUBCUTANEOUS
  Administered 2019-11-11: 03:00:00 2 [IU] via SUBCUTANEOUS

## 2019-11-07 MED ORDER — DEXTROSE 50 % IV SOLN
50 % | INTRAVENOUS | Status: DC | PRN
Start: 2019-11-07 — End: 2019-11-11

## 2019-11-07 MED ORDER — NORMAL SALINE FLUSH 0.9 % IV SOLN
0.9 % | INTRAVENOUS | Status: DC | PRN
Start: 2019-11-07 — End: 2019-11-08

## 2019-11-07 MED ORDER — GLUCOSE 40 % PO GEL
40 % | ORAL | Status: DC | PRN
Start: 2019-11-07 — End: 2019-11-11

## 2019-11-07 MED ORDER — LIDOCAINE HCL 1 % IJ SOLN
1 % | INTRAMUSCULAR | Status: AC
Start: 2019-11-07 — End: 2019-11-07
  Administered 2019-11-07: 19:00:00 5 via INTRADERMAL

## 2019-11-07 MED ORDER — KETOROLAC TROMETHAMINE 15 MG/ML IJ SOLN
15 MG/ML | Freq: Four times a day (QID) | INTRAMUSCULAR | Status: DC | PRN
Start: 2019-11-07 — End: 2019-11-10
  Administered 2019-11-07 – 2019-11-10 (×6): 15 mg via INTRAVENOUS

## 2019-11-07 MED ORDER — CLINDAMYCIN PHOSPHATE IN D5W 600 MG/50ML IV SOLN
600 MG/50ML | Freq: Three times a day (TID) | INTRAVENOUS | Status: DC
Start: 2019-11-07 — End: 2019-11-08
  Administered 2019-11-08 (×2): 600 mg via INTRAVENOUS

## 2019-11-07 MED ORDER — MORPHINE SULFATE 10 MG/ML IJ SOLN
10 MG/ML | Freq: Once | INTRAMUSCULAR | Status: DC
Start: 2019-11-07 — End: 2019-11-07

## 2019-11-07 MED ORDER — NORMAL SALINE FLUSH 0.9 % IV SOLN
0.9 | INTRAVENOUS | Status: DC | PRN
Start: 2019-11-07 — End: 2019-11-11

## 2019-11-07 MED ORDER — GLUCAGON HCL RDNA (DIAGNOSTIC) 1 MG IJ SOLR
1 MG | INTRAMUSCULAR | Status: DC | PRN
Start: 2019-11-07 — End: 2019-11-11

## 2019-11-07 MED ORDER — ACETAMINOPHEN 650 MG RE SUPP
650 MG | Freq: Four times a day (QID) | RECTAL | Status: DC | PRN
Start: 2019-11-07 — End: 2019-11-11

## 2019-11-07 MED ORDER — MELATONIN 3 MG PO TABS
3 MG | Freq: Every evening | ORAL | Status: DC
Start: 2019-11-07 — End: 2019-11-11
  Administered 2019-11-08 – 2019-11-11 (×4): 3 mg via ORAL

## 2019-11-07 MED ORDER — DEXTROSE 5 % IV SOLN
5 % | Freq: Two times a day (BID) | INTRAVENOUS | Status: DC
Start: 2019-11-07 — End: 2019-11-09
  Administered 2019-11-08 – 2019-11-09 (×3): 1750 mg/kg via INTRAVENOUS

## 2019-11-07 MED ORDER — NORMAL SALINE FLUSH 0.9 % IV SOLN
0.9 | Freq: Two times a day (BID) | INTRAVENOUS | Status: DC
Start: 2019-11-07 — End: 2019-11-08

## 2019-11-07 MED ORDER — NORMAL SALINE FLUSH 0.9 % IV SOLN
0.9 % | Freq: Three times a day (TID) | INTRAVENOUS | Status: DC
Start: 2019-11-07 — End: 2019-11-08

## 2019-11-07 MED ORDER — ENOXAPARIN SODIUM 40 MG/0.4ML SC SOLN
40 | Freq: Every day | SUBCUTANEOUS | Status: DC
Start: 2019-11-07 — End: 2019-11-11
  Administered 2019-11-10: 13:00:00 40 mg via SUBCUTANEOUS

## 2019-11-07 MED ORDER — CLINDAMYCIN PHOSPHATE IN D5W 600 MG/50ML IV SOLN
600 MG/50ML | Freq: Once | INTRAVENOUS | Status: AC
Start: 2019-11-07 — End: 2019-11-07
  Administered 2019-11-07: 18:00:00 600 mg via INTRAVENOUS

## 2019-11-07 MED ORDER — CLONIDINE HCL 0.1 MG PO TABS
0.1 MG | ORAL | Status: DC | PRN
Start: 2019-11-07 — End: 2019-11-08

## 2019-11-07 MED ORDER — TRAMADOL HCL 50 MG PO TABS
50 MG | ORAL | Status: DC | PRN
Start: 2019-11-07 — End: 2019-11-08

## 2019-11-07 MED ORDER — ACETAMINOPHEN 325 MG PO TABS
325 MG | Freq: Four times a day (QID) | ORAL | Status: DC | PRN
Start: 2019-11-07 — End: 2019-11-11
  Administered 2019-11-08 – 2019-11-11 (×3): 650 mg via ORAL

## 2019-11-07 MED ORDER — SODIUM CHLORIDE 0.9% BOLUS (FLUID RESUSCITATION)
0.9 | Freq: Once | INTRAVENOUS | Status: AC
Start: 2019-11-07 — End: 2019-11-07
  Administered 2019-11-07: 16:00:00 2448 mL/kg via INTRAVENOUS

## 2019-11-07 MED ORDER — KETOROLAC TROMETHAMINE 30 MG/ML IJ SOLN
30 MG/ML | Freq: Once | INTRAMUSCULAR | Status: AC
Start: 2019-11-07 — End: 2019-11-07
  Administered 2019-11-07: 20:00:00 15 mg via INTRAVENOUS

## 2019-11-07 MED ORDER — VANCOMYCIN HCL 1 G IV SOLR
1 g | Freq: Once | INTRAVENOUS | Status: AC
Start: 2019-11-07 — End: 2019-11-07
  Administered 2019-11-07: 16:00:00 1250 mg/kg via INTRAVENOUS

## 2019-11-07 MED ORDER — GABAPENTIN 300 MG PO CAPS
300 MG | Freq: Three times a day (TID) | ORAL | Status: DC | PRN
Start: 2019-11-07 — End: 2019-11-08
  Administered 2019-11-08: 08:00:00 300 mg via ORAL

## 2019-11-07 MED ORDER — HYDROXYZINE PAMOATE 50 MG PO CAPS
50 MG | Freq: Three times a day (TID) | ORAL | Status: DC | PRN
Start: 2019-11-07 — End: 2019-11-11
  Administered 2019-11-08 – 2019-11-11 (×3): 50 mg via ORAL

## 2019-11-07 MED ORDER — ONDANSETRON HCL 4 MG/2ML IJ SOLN
4 MG/2ML | Freq: Four times a day (QID) | INTRAMUSCULAR | Status: DC | PRN
Start: 2019-11-07 — End: 2019-11-11

## 2019-11-07 MED FILL — CLINDAMYCIN PHOSPHATE IN D5W 600 MG/50ML IV SOLN: 600 MG/50ML | INTRAVENOUS | Qty: 50

## 2019-11-07 MED FILL — HYDROXYZINE PAMOATE 50 MG PO CAPS: 50 mg | ORAL | Qty: 1

## 2019-11-07 MED FILL — VANCOMYCIN HCL 10 G IV SOLR: 10 g | INTRAVENOUS | Qty: 1250

## 2019-11-07 MED FILL — LIDOCAINE HCL 1 % IJ SOLN: 1 % | INTRAMUSCULAR | Qty: 20

## 2019-11-07 MED FILL — DICYCLOMINE HCL 20 MG PO TABS: 20 mg | ORAL | Qty: 1

## 2019-11-07 MED FILL — KETOROLAC TROMETHAMINE 15 MG/ML IJ SOLN: 15 mg/mL | INTRAMUSCULAR | Qty: 1

## 2019-11-07 MED FILL — MORPHINE SULFATE 10 MG/ML IJ SOLN: 10 mg/mL | INTRAMUSCULAR | Qty: 1

## 2019-11-07 MED FILL — KETOROLAC TROMETHAMINE 30 MG/ML IJ SOLN: 30 mg/mL | INTRAMUSCULAR | Qty: 1

## 2019-11-07 MED FILL — VANCOMYCIN HCL 10 G IV SOLR: 10 g | INTRAVENOUS | Qty: 1750

## 2019-11-08 LAB — CBC WITH AUTO DIFFERENTIAL
Absolute Baso #: 0 10*3/uL (ref 0.0–0.2)
Absolute Eos #: 0 10*3/uL (ref 0.0–0.5)
Absolute Lymph #: 1.4 10*3/uL (ref 1.0–4.3)
Absolute Mono #: 0.6 10*3/uL (ref 0.0–0.8)
Absolute Neut #: 4.3 10*3/uL (ref 1.8–7.0)
Basophils: 0.5 % (ref 0.0–2.0)
Eosinophils: 0.1 % — ABNORMAL LOW (ref 1.0–6.0)
Granulocytes %: 67.8 % (ref 40.0–80.0)
Hematocrit: 37.4 % — ABNORMAL LOW (ref 40.0–52.0)
Hemoglobin: 12.4 g/dL — ABNORMAL LOW (ref 13.0–18.0)
Lymphocyte %: 22.6 % (ref 20.0–40.0)
MCH: 28.2 pg (ref 26.0–34.0)
MCHC: 33.1 % (ref 32.0–36.0)
MCV: 85.2 fL (ref 80.0–98.0)
MPV: 6.6 fL — ABNORMAL LOW (ref 7.4–10.4)
Monocytes: 9 % (ref 2.0–10.0)
Platelets: 218 10*3/uL (ref 140–440)
RBC: 4.39 10*6/uL — ABNORMAL LOW (ref 4.40–5.90)
RDW: 14.2 % (ref 11.5–14.5)
WBC: 6.3 10*3/uL (ref 3.6–10.7)

## 2019-11-08 LAB — POCT GLUCOSE
POC Glucose: 180 mg/dL — ABNORMAL HIGH (ref 70–100)
POC Glucose: 180 mg/dL — ABNORMAL HIGH (ref 70–100)
POC Glucose: 201 mg/dL — ABNORMAL HIGH (ref 70–100)
POC Glucose: 252 mg/dL — ABNORMAL HIGH (ref 70–100)
POC Glucose: 270 mg/dL — ABNORMAL HIGH (ref 70–100)

## 2019-11-08 LAB — ADD ON LAB TEST

## 2019-11-08 LAB — BASIC METABOLIC PANEL W/ REFLEX TO MG FOR LOW K
Anion Gap: 6 mmol/L (ref 3–13)
BUN: 13 mg/dL (ref 7–20)
CO2: 27 mmol/L (ref 22–30)
Calcium: 8.9 mg/dL (ref 8.4–10.4)
Chloride: 103 mmol/L (ref 98–107)
Creatinine: 0.66 mg/dL (ref 0.52–1.25)
EGFR IF NonAfrican American: >90 mL/min (ref >60)
Glucose: 247 mg/dL — ABNORMAL HIGH (ref 70–100)
Potassium: 4.4 mmol/L (ref 3.5–5.1)
Sodium: 136 mmol/L (ref 135–145)
eGFR African American: >90 mL/min (ref >60)

## 2019-11-08 LAB — C-REACTIVE PROTEIN: CRP: 83.4 mg/L — ABNORMAL HIGH (ref 0.0–6.0)

## 2019-11-08 LAB — HIV SCREEN: HIV 1+2 AB+HIV1P24 AG, EIA: NONREACTIVE NA

## 2019-11-08 LAB — SEDIMENTATION RATE: Sed Rate: 35 mm/h — ABNORMAL HIGH (ref 0–10)

## 2019-11-08 LAB — FENTANYL, URINE: Fentanyl: POSITIVE NA

## 2019-11-08 MED ORDER — HYDROMORPHONE HCL 1 MG/ML IJ SOLN
1 MG/ML | INTRAMUSCULAR | Status: DC | PRN
Start: 2019-11-08 — End: 2019-11-08

## 2019-11-08 MED ORDER — HYDROMORPHONE HCL 1 MG/ML IJ SOLN
1 MG/ML | Freq: Once | INTRAMUSCULAR | Status: AC
Start: 2019-11-08 — End: 2019-11-08

## 2019-11-08 MED ORDER — METHADONE HCL 10 MG/ML PO CONC
10 MG/ML | Freq: Every day | ORAL | Status: DC
Start: 2019-11-08 — End: 2019-11-11
  Administered 2019-11-08 – 2019-11-11 (×4): 90 mg via ORAL

## 2019-11-08 MED ORDER — HYDROMORPHONE HCL 1 MG/ML IJ SOLN
1 MG/ML | INTRAMUSCULAR | Status: DC | PRN
Start: 2019-11-08 — End: 2019-11-10
  Administered 2019-11-08 – 2019-11-10 (×4): 0.5 mg via INTRAVENOUS

## 2019-11-08 MED ORDER — NICOTINE 21 MG/24HR TD PT24
21 MG/24HR | Freq: Every day | TRANSDERMAL | Status: DC
Start: 2019-11-08 — End: 2019-11-11
  Administered 2019-11-08 – 2019-11-10 (×3): 1 via TRANSDERMAL

## 2019-11-08 MED ORDER — HYDROMORPHONE HCL 1 MG/ML IJ SOLN
1 MG/ML | INTRAMUSCULAR | Status: AC
Start: 2019-11-08 — End: 2019-11-08
  Administered 2019-11-08: 15:00:00 0.5 via INTRAVENOUS

## 2019-11-08 MED ORDER — INSULIN GLARGINE 100 UNIT/ML SC SOLN
100 UNIT/ML | Freq: Every day | SUBCUTANEOUS | Status: DC
Start: 2019-11-08 — End: 2019-11-10
  Administered 2019-11-08 – 2019-11-09 (×2): 10 [IU] via SUBCUTANEOUS

## 2019-11-08 MED ORDER — IOPAMIDOL 76 % IV SOLN
76 % | Freq: Once | INTRAVENOUS | Status: DC | PRN
Start: 2019-11-08 — End: 2019-11-11

## 2019-11-08 MED ORDER — OXYCODONE HCL 5 MG PO TABS
5 MG | ORAL | Status: DC | PRN
Start: 2019-11-08 — End: 2019-11-08

## 2019-11-08 MED ORDER — BACLOFEN 10 MG PO TABS
10 MG | Freq: Three times a day (TID) | ORAL | Status: DC
Start: 2019-11-08 — End: 2019-11-11
  Administered 2019-11-08 – 2019-11-11 (×10): 10 mg via ORAL

## 2019-11-08 MED ORDER — GABAPENTIN 300 MG PO CAPS
300 MG | Freq: Three times a day (TID) | ORAL | Status: DC
Start: 2019-11-08 — End: 2019-11-09
  Administered 2019-11-08 – 2019-11-09 (×4): 300 mg via ORAL

## 2019-11-08 MED ORDER — THERAPEUTIC MULTIVIT/MINERAL PO TABS
Freq: Every day | ORAL | Status: DC
Start: 2019-11-08 — End: 2019-11-11
  Administered 2019-11-08 – 2019-11-10 (×3): 1 via ORAL

## 2019-11-08 MED ORDER — OXYCODONE HCL 5 MG PO TABS
5 MG | ORAL | Status: DC | PRN
Start: 2019-11-08 — End: 2019-11-08
  Administered 2019-11-08: 18:00:00 5 mg via ORAL

## 2019-11-08 MED ORDER — TRAZODONE HCL 100 MG PO TABS
100 MG | Freq: Every evening | ORAL | Status: DC | PRN
Start: 2019-11-08 — End: 2019-11-11
  Administered 2019-11-10: 02:00:00 100 mg via ORAL

## 2019-11-08 MED ORDER — HYDROMORPHONE HCL 1 MG/ML IJ SOLN
1 MG/ML | INTRAMUSCULAR | Status: DC | PRN
Start: 2019-11-08 — End: 2019-11-10

## 2019-11-08 MED FILL — CLINDAMYCIN PHOSPHATE IN D5W 600 MG/50ML IV SOLN: 600 MG/50ML | INTRAVENOUS | Qty: 50

## 2019-11-08 MED FILL — ACETAMINOPHEN 325 MG PO TABS: 325 mg | ORAL | Qty: 2

## 2019-11-08 MED FILL — NICOTINE 21 MG/24HR TD PT24: 21 mg/(24.h) | TRANSDERMAL | Qty: 1

## 2019-11-08 MED FILL — THERA-M PO TABS: ORAL | Qty: 1

## 2019-11-08 MED FILL — VANCOMYCIN HCL 1 G IV SOLR: 1 g | INTRAVENOUS | Qty: 1750

## 2019-11-08 MED FILL — KETOROLAC TROMETHAMINE 15 MG/ML IJ SOLN: 15 mg/mL | INTRAMUSCULAR | Qty: 1

## 2019-11-08 MED FILL — TRAZODONE HCL 100 MG PO TABS: 100 mg | ORAL | Qty: 1

## 2019-11-08 MED FILL — BACLOFEN 10 MG PO TABS: 10 mg | ORAL | Qty: 1

## 2019-11-08 MED FILL — GABAPENTIN 300 MG PO CAPS: 300 mg | ORAL | Qty: 1

## 2019-11-08 MED FILL — OXYCODONE HCL 5 MG PO TABS: 5 mg | ORAL | Qty: 1

## 2019-11-08 MED FILL — ENOXAPARIN SODIUM 40 MG/0.4ML SC SOLN: 40 MG/0.4ML | SUBCUTANEOUS | Qty: 0.4

## 2019-11-08 MED FILL — HYDROMORPHONE HCL 1 MG/ML IJ SOLN: 1 mg/mL | INTRAMUSCULAR | Qty: 0.5

## 2019-11-08 MED FILL — VANCOMYCIN HCL 10 G IV SOLR: 10 g | INTRAVENOUS | Qty: 1750

## 2019-11-08 MED FILL — METHADONE HCL 10 MG/ML PO CONC: 10 mg/mL | ORAL | Qty: 10

## 2019-11-08 MED FILL — MELATONIN 3 MG PO TABS: 3 mg | ORAL | Qty: 1

## 2019-11-09 LAB — CBC WITH AUTO DIFFERENTIAL
Absolute Baso #: 0 10*3/uL (ref 0.0–0.2)
Absolute Eos #: 0 10*3/uL (ref 0.0–0.5)
Absolute Lymph #: 1.4 10*3/uL (ref 1.0–4.3)
Absolute Mono #: 0.5 10*3/uL (ref 0.0–0.8)
Absolute Neut #: 2.7 10*3/uL (ref 1.8–7.0)
Basophils: 0.7 % (ref 0.0–2.0)
Eosinophils: 0 % — ABNORMAL LOW (ref 1.0–6.0)
Granulocytes %: 58.8 % (ref 40.0–80.0)
Hematocrit: 35 % — ABNORMAL LOW (ref 40.0–52.0)
Hemoglobin: 11.5 g/dL — ABNORMAL LOW (ref 13.0–18.0)
Lymphocyte %: 29.9 % (ref 20.0–40.0)
MCH: 27.5 pg (ref 26.0–34.0)
MCHC: 32.8 % (ref 32.0–36.0)
MCV: 83.8 fL (ref 80.0–98.0)
MPV: 6.6 fL — ABNORMAL LOW (ref 7.4–10.4)
Monocytes: 10.6 % — ABNORMAL HIGH (ref 2.0–10.0)
Platelets: 191 10*3/uL (ref 140–440)
RBC: 4.18 10*6/uL — ABNORMAL LOW (ref 4.40–5.90)
RDW: 14.2 % (ref 11.5–14.5)
WBC: 4.7 10*3/uL (ref 3.6–10.7)

## 2019-11-09 LAB — PROCALCITONIN: Procalcitonin: <0.1 ng/mL (ref <0.10)

## 2019-11-09 LAB — SEDIMENTATION RATE: Sed Rate: 42 mm/h — ABNORMAL HIGH (ref 0–10)

## 2019-11-09 LAB — POCT GLUCOSE
POC Glucose: 226 mg/dL — ABNORMAL HIGH (ref 70–100)
POC Glucose: 194 mg/dL — ABNORMAL HIGH (ref 70–100)
POC Glucose: 206 mg/dL — ABNORMAL HIGH (ref 70–100)
POC Glucose: 242 mg/dL — ABNORMAL HIGH (ref 70–100)

## 2019-11-09 LAB — VANCOMYCIN LEVEL, TROUGH: Vancomycin Tr: 8 ug/mL — ABNORMAL LOW (ref 15.0–20.0)

## 2019-11-09 LAB — C-REACTIVE PROTEIN: CRP: 71.1 mg/L — ABNORMAL HIGH (ref 0.0–6.0)

## 2019-11-09 MED ORDER — VANCOMYCIN HCL 1 G IV SOLR
1 g | Freq: Two times a day (BID) | INTRAVENOUS | Status: DC
Start: 2019-11-09 — End: 2019-11-11
  Administered 2019-11-09 – 2019-11-11 (×4): 2000 mg via INTRAVENOUS

## 2019-11-09 MED ORDER — GABAPENTIN 100 MG PO CAPS
100 MG | Freq: Three times a day (TID) | ORAL | Status: DC
Start: 2019-11-09 — End: 2019-11-11
  Administered 2019-11-09 – 2019-11-11 (×6): 100 mg via ORAL

## 2019-11-09 MED ORDER — HYDROMORPHONE HCL 1 MG/ML IJ SOLN
1 MG/ML | Freq: Once | INTRAMUSCULAR | Status: AC
Start: 2019-11-09 — End: 2019-11-09
  Administered 2019-11-09: 20:00:00 0.5 mg via INTRAVENOUS

## 2019-11-09 MED ORDER — LIDOCAINE-EPINEPHRINE 1 %-1:100000 IJ SOLN
1 %-:00000 | Freq: Once | INTRAMUSCULAR | Status: AC
Start: 2019-11-09 — End: 2019-11-09
  Administered 2019-11-09: 20:00:00 20 mL via INTRADERMAL

## 2019-11-09 MED FILL — BACLOFEN 10 MG PO TABS: 10 mg | ORAL | Qty: 1

## 2019-11-09 MED FILL — HYDROMORPHONE HCL 1 MG/ML IJ SOLN: 1 mg/mL | INTRAMUSCULAR | Qty: 0.5

## 2019-11-09 MED FILL — KETOROLAC TROMETHAMINE 15 MG/ML IJ SOLN: 15 mg/mL | INTRAMUSCULAR | Qty: 1

## 2019-11-09 MED FILL — MELATONIN 3 MG PO TABS: 3 mg | ORAL | Qty: 1

## 2019-11-09 MED FILL — GABAPENTIN 100 MG PO CAPS: 100 mg | ORAL | Qty: 1

## 2019-11-09 MED FILL — THERA-M PO TABS: ORAL | Qty: 1

## 2019-11-09 MED FILL — GABAPENTIN 300 MG PO CAPS: 300 mg | ORAL | Qty: 1

## 2019-11-09 MED FILL — ENOXAPARIN SODIUM 40 MG/0.4ML SC SOLN: 40 MG/0.4ML | SUBCUTANEOUS | Qty: 0.4

## 2019-11-09 MED FILL — XYLOCAINE/EPINEPHRINE 1 %-1:100000 IJ SOLN: 1 %-:00000 | INTRAMUSCULAR | Qty: 20

## 2019-11-09 MED FILL — NICOTINE 21 MG/24HR TD PT24: 21 mg/(24.h) | TRANSDERMAL | Qty: 1

## 2019-11-09 MED FILL — VANCOMYCIN HCL 10 G IV SOLR: 10 g | INTRAVENOUS | Qty: 2000

## 2019-11-09 MED FILL — METHADONE HCL 10 MG/ML PO CONC: 10 mg/mL | ORAL | Qty: 10

## 2019-11-09 MED FILL — VANCOMYCIN HCL 1 G IV SOLR: 1 g | INTRAVENOUS | Qty: 2000

## 2019-11-09 MED FILL — VANCOMYCIN HCL 1 G IV SOLR: 1 g | INTRAVENOUS | Qty: 1750

## 2019-11-10 LAB — CBC WITH AUTO DIFFERENTIAL
Absolute Baso #: 0 10*3/uL (ref 0.0–0.2)
Absolute Eos #: 0 10*3/uL (ref 0.0–0.5)
Absolute Lymph #: 1.3 10*3/uL (ref 1.0–4.3)
Absolute Mono #: 0.5 10*3/uL (ref 0.0–0.8)
Absolute Neut #: 2.7 10*3/uL (ref 1.8–7.0)
Basophils: 0.9 % (ref 0.0–2.0)
Eosinophils: 0.1 % — ABNORMAL LOW (ref 1.0–6.0)
Granulocytes %: 60.2 % (ref 40.0–80.0)
Hematocrit: 35.4 % — ABNORMAL LOW (ref 40.0–52.0)
Hemoglobin: 11.9 g/dL — ABNORMAL LOW (ref 13.0–18.0)
Lymphocyte %: 28.6 % (ref 20.0–40.0)
MCH: 28.1 pg (ref 26.0–34.0)
MCHC: 33.6 % (ref 32.0–36.0)
MCV: 83.5 fL (ref 80.0–98.0)
MPV: 6.4 fL — ABNORMAL LOW (ref 7.4–10.4)
Monocytes: 10.2 % — ABNORMAL HIGH (ref 2.0–10.0)
Platelets: 252 10*3/uL (ref 140–440)
RBC: 4.24 10*6/uL — ABNORMAL LOW (ref 4.40–5.90)
RDW: 14 % (ref 11.5–14.5)
WBC: 4.5 10*3/uL (ref 3.6–10.7)

## 2019-11-10 LAB — POCT GLUCOSE
POC Glucose: 201 mg/dL — ABNORMAL HIGH (ref 70–100)
POC Glucose: 201 mg/dL — ABNORMAL HIGH (ref 70–100)
POC Glucose: 235 mg/dL — ABNORMAL HIGH (ref 70–100)
POC Glucose: 247 mg/dL — ABNORMAL HIGH (ref 70–100)

## 2019-11-10 LAB — BASIC METABOLIC PANEL W/ REFLEX TO MG FOR LOW K
Anion Gap: 7 mmol/L (ref 3–13)
BUN: 17 mg/dL (ref 7–20)
CO2: 28 mmol/L (ref 22–30)
Calcium: 8.9 mg/dL (ref 8.4–10.4)
Chloride: 103 mmol/L (ref 98–107)
Creatinine: 0.69 mg/dL (ref 0.52–1.25)
EGFR IF NonAfrican American: >90 mL/min (ref >60)
Glucose: 243 mg/dL — ABNORMAL HIGH (ref 70–100)
Potassium: 4.3 mmol/L (ref 3.5–5.1)
Sodium: 138 mmol/L (ref 135–145)
eGFR African American: >90 mL/min (ref >60)

## 2019-11-10 LAB — CULTURE, AEROBIC BACTERIA WITH GRAM STAIN

## 2019-11-10 LAB — C-REACTIVE PROTEIN: CRP: 48.2 mg/L — ABNORMAL HIGH (ref 0.0–6.0)

## 2019-11-10 LAB — SEDIMENTATION RATE: Sed Rate: 53 mm/h — ABNORMAL HIGH (ref 0–10)

## 2019-11-10 MED ORDER — INSULIN GLARGINE 100 UNIT/ML SC SOLN
100 UNIT/ML | Freq: Every day | SUBCUTANEOUS | Status: DC
Start: 2019-11-10 — End: 2019-11-11
  Administered 2019-11-10 – 2019-11-11 (×2): 15 [IU] via SUBCUTANEOUS

## 2019-11-10 MED ORDER — OXYCODONE HCL 5 MG PO TABS
5 MG | ORAL | Status: DC | PRN
Start: 2019-11-10 — End: 2019-11-11
  Administered 2019-11-10 – 2019-11-11 (×3): 5 mg via ORAL

## 2019-11-10 MED ORDER — OXYCODONE HCL 5 MG PO TABS
5 MG | ORAL | Status: DC | PRN
Start: 2019-11-10 — End: 2019-11-11

## 2019-11-10 MED ORDER — NAPROXEN 250 MG PO TABS
250 MG | Freq: Two times a day (BID) | ORAL | Status: DC | PRN
Start: 2019-11-10 — End: 2019-11-11

## 2019-11-10 MED FILL — THERA-M PO TABS: ORAL | Qty: 1

## 2019-11-10 MED FILL — VANCOMYCIN HCL 10 G IV SOLR: 10 g | INTRAVENOUS | Qty: 2000

## 2019-11-10 MED FILL — GABAPENTIN 100 MG PO CAPS: 100 mg | ORAL | Qty: 1

## 2019-11-10 MED FILL — OXYCODONE HCL 5 MG PO TABS: 5 mg | ORAL | Qty: 1

## 2019-11-10 MED FILL — ACETAMINOPHEN 325 MG PO TABS: 325 mg | ORAL | Qty: 2

## 2019-11-10 MED FILL — LANTUS 100 UNIT/ML SC SOLN: 100 [IU]/mL | SUBCUTANEOUS | Qty: 15

## 2019-11-10 MED FILL — METHADONE HCL 10 MG/ML PO CONC: 10 mg/mL | ORAL | Qty: 10

## 2019-11-10 MED FILL — ENOXAPARIN SODIUM 40 MG/0.4ML SC SOLN: 40 MG/0.4ML | SUBCUTANEOUS | Qty: 0.4

## 2019-11-10 MED FILL — NAPROXEN 250 MG PO TABS: 250 mg | ORAL | Qty: 1

## 2019-11-10 MED FILL — HYDROMORPHONE HCL 1 MG/ML IJ SOLN: 1 mg/mL | INTRAMUSCULAR | Qty: 0.5

## 2019-11-10 MED FILL — BACLOFEN 10 MG PO TABS: 10 mg | ORAL | Qty: 1

## 2019-11-10 MED FILL — KETOROLAC TROMETHAMINE 15 MG/ML IJ SOLN: 15 mg/mL | INTRAMUSCULAR | Qty: 1

## 2019-11-10 MED FILL — NICOTINE 21 MG/24HR TD PT24: 21 mg/(24.h) | TRANSDERMAL | Qty: 1

## 2019-11-10 MED FILL — MELATONIN 3 MG PO TABS: 3 mg | ORAL | Qty: 1

## 2019-11-11 LAB — BASIC METABOLIC PANEL W/ REFLEX TO MG FOR LOW K
Anion Gap: 4 mmol/L (ref 3–13)
BUN: 18 mg/dL (ref 7–20)
CO2: 31 mmol/L — ABNORMAL HIGH (ref 22–30)
Calcium: 8.9 mg/dL (ref 8.4–10.4)
Chloride: 105 mmol/L (ref 98–107)
Creatinine: 0.65 mg/dL (ref 0.52–1.25)
EGFR IF NonAfrican American: >90 mL/min (ref >60)
Glucose: 245 mg/dL — ABNORMAL HIGH (ref 70–100)
Potassium: 4.4 mmol/L (ref 3.5–5.1)
Sodium: 140 mmol/L (ref 135–145)
eGFR African American: >90 mL/min (ref >60)

## 2019-11-11 LAB — POCT GLUCOSE
POC Glucose: 299 mg/dL — ABNORMAL HIGH (ref 70–100)
POC Glucose: 161 mg/dL — ABNORMAL HIGH (ref 70–100)

## 2019-11-11 LAB — VANCOMYCIN LEVEL, TROUGH: Vancomycin Tr: 14 ug/mL — ABNORMAL LOW (ref 15.0–20.0)

## 2019-11-11 MED ORDER — METFORMIN HCL 500 MG PO TABS
500 MG | ORAL_TABLET | Freq: Two times a day (BID) | ORAL | 0 refills | Status: AC
Start: 2019-11-11 — End: ?

## 2019-11-11 MED ORDER — NICOTINE 21 MG/24HR TD PT24
2124 MG/24HR | MEDICATED_PATCH | Freq: Every day | TRANSDERMAL | 0 refills | Status: AC
Start: 2019-11-11 — End: ?

## 2019-11-11 MED ORDER — SULFAMETHOXAZOLE-TRIMETHOPRIM 800-160 MG PO TABS
800-160 MG | Freq: Two times a day (BID) | ORAL | Status: DC
Start: 2019-11-11 — End: 2019-11-11

## 2019-11-11 MED ORDER — SULFAMETHOXAZOLE-TRIMETHOPRIM 800-160 MG PO TABS
800-160 MG | ORAL_TABLET | Freq: Two times a day (BID) | ORAL | 0 refills | Status: AC
Start: 2019-11-11 — End: 2019-11-18

## 2019-11-11 MED ORDER — OXYCODONE HCL 5 MG PO TABS
5 MG | ORAL_TABLET | ORAL | 0 refills | Status: AC | PRN
Start: 2019-11-11 — End: 2019-11-13

## 2019-11-11 MED ORDER — ACETAMINOPHEN 500 MG PO TABS
500 MG | ORAL_TABLET | Freq: Three times a day (TID) | ORAL | 0 refills | Status: AC
Start: 2019-11-11 — End: ?

## 2019-11-11 MED FILL — METHADONE HCL 10 MG/ML PO CONC: 10 mg/mL | ORAL | Qty: 10

## 2019-11-11 MED FILL — OXYCODONE HCL 5 MG PO TABS: 5 mg | ORAL | Qty: 1

## 2019-11-11 MED FILL — HUMALOG 100 UNIT/ML SC SOLN: 100 [IU]/mL | SUBCUTANEOUS | Qty: 300

## 2019-11-11 MED FILL — VANCOMYCIN HCL 1 G IV SOLR: 1 g | INTRAVENOUS | Qty: 2000

## 2019-11-11 MED FILL — ACETAMINOPHEN 325 MG PO TABS: 325 mg | ORAL | Qty: 2

## 2019-11-11 MED FILL — MELATONIN 3 MG PO TABS: 3 mg | ORAL | Qty: 1

## 2019-11-11 MED FILL — SULFAMETHOXAZOLE-TRIMETHOPRIM 800-160 MG PO TABS: 800-160 mg | ORAL | Qty: 2

## 2019-11-11 MED FILL — GABAPENTIN 100 MG PO CAPS: 100 mg | ORAL | Qty: 1

## 2019-11-11 MED FILL — HUMALOG 100 UNIT/ML SC SOLN: 100 [IU]/mL | SUBCUTANEOUS | Qty: 600

## 2019-11-11 MED FILL — LANTUS 100 UNIT/ML SC SOLN: 100 [IU]/mL | SUBCUTANEOUS | Qty: 15

## 2019-11-11 NOTE — Telephone Encounter (Signed)
Please schedule for an IN OFFICE hospital follow up appointment per hospital follow up protocol - see dc summary     Patient has history of poor compliance and follow up     Will need bmp per dc summary

## 2019-11-11 NOTE — Discharge Instructions (Signed)
Please take medications as prescribed. Please follow up with your PCP within 1 week  to discuss hospital stay.     Continue to change dressing each day and additionally as needed if dressing is soiled.     Please call your PCP or return to the ED with new or worsening symptoms.

## 2019-11-11 NOTE — Telephone Encounter (Signed)
[facility] Discharge Summary with   Discharge DayProgress Note and Transition Note        Mason Garza  DOB: [birth year]  MRN:  [id number]    ADMIT DATE: 11/07/2019  DISCHARGE DATE:  11/11/2019    PRIMARYCARE PHYSICIAN:  Omelia Blackwater, MD    VISIT STATUS: Admission    CODE STATUS:  Prior    DISCHARGE DIAGNOSES:    Principal Problem:    Cellulitis and abscess of right leg  Active Problems:    Type 2 diabetes mellitus with diabetic neuropathy (HCC)    Chronic hepatitis C (HCC)    Severe opioid use disorder on maintenance therapy ([place])    IV drug abuse ([place])    Hyperglycemia    Cellulitis    Abscess  Resolved Problems:    * No resolved hospital problems. *      HOSPITAL COURSE:  Mason Garza is a 54 y/o M with a h/o IVDA, uncontrolled DM2, neuropathy, MRSA infection, and hepatitis C who presented due to R leg swelling, pain, and redness.    Abscess with surrounding cellulitis of the right lower extremity  S/p I and D in ER 11/07/19, and repeat I and D at bedside via general surgery 11/09/19.  Wound culture grew MRSA, sensitivities reviewed. Vancomycin started 3/22 - transitioned to Bactrim on 11/11/19. Discussed with ID stewardship who recommended Bactrim DS 2 tablets BID. Repeat BMP ordered for 11/14/19 to monitor renal function with high dose bactrim -patient states he is planning to have this done at Dr. Loyal Buba office.  Scheduled tylenol and 2 day prescription of oxycodone provided on discharge due to acute pain.  Patient instructed to keep right lower extremity elevated as much as possible.  He understands what to do with the wound dressing and states he will be able to do this on his own.  ??  DM2 with hyperglycemia  Neuropathy  A1c 7.9% (only prior in system 9.0 in 2019). Prescribed Metformin on DC. Pt to follow up with PCP.   ??  Opioid abuse/IVDA  Last heroin use reported 11/05/2019.  Addiction medicine evaluated patient - no evidence of withdrawal.  Pt reports close contact with his counselor at  [facility]. He states he is planning on bringing his DC paperwork to her tomorrow so that they are aware he has an oxycodone Rx.   ??  Sinus bradycardia  Asx.     Chronic hepatitis C, diagnosed 2017  Human immunodeficiency virus screen nonreactive.    DCI discussed, all questions addressed. Patient discharged in satisfactory condition.     DAY OF DISCHARGE:  Review of Systems   Constitutional: Negative for fever.   Respiratory: Negative for shortness of breath.    Cardiovascular: Positive for leg swelling (RLE, improving). Negative for chest pain.   Gastrointestinal: Negative for abdominal pain.     Pt reports feeling well this morning. No new symptoms. States swelling and pain are improving in RLE    Patient Vitals for the past 24 hrs:   BP Temp Temp src Pulse Resp SpO2   11/11/19 0717 (!) 107/50 97 ??F (36.1 ??C) Temporal (!) 44 18 97 %   11/10/19 1930 -- -- -- 52 -- --   11/10/19 1912 (!) 103/55 96.8 ??F (36 ??C) Temporal (!) 43 18 94 %       Average, Min, and Max forlast 24 hours Vitals:  TEMPERATURE:  Temp  Avg: 96.9 ??F (36.1 ??C)  Min: 96.8 ??F (36 ??C)  Max: 97 ??  F (36.1 ??C)    RESPIRATIONS RANGE: Resp  Avg: 18  Min: 18  Max: 18    PULSE RANGE: Pulse  Avg: 46.3  Min: 43  Max: 52    BLOOD PRESSURE RANGE:  Systolic (95AOZ), HYQ:657 , Min:103 , QIO:962   ; Diastolic (95MWU), XLK:44, Min:50, Max:55      PULSE OXIMETRYRANGE: SpO2  Avg: 95.5 %  Min: 94 %  Max: 97 %    No intake/output data recorded.    Physical Exam  Vitals signs and nursing note reviewed.   Constitutional:       General: He is not in acute distress.  HENT:      Mouth/Throat:      Mouth: Mucous membranes are moist.   Eyes:      General: No scleral icterus.  Cardiovascular:      Rate and Rhythm: Normal rate and regular rhythm.   Pulmonary:      Effort: No respiratory distress.      Breath sounds: No wheezing, rhonchi or rales.   Abdominal:      Palpations: Abdomen is soft.      Tenderness: There is no abdominal tenderness. There is no guarding.    Musculoskeletal:      Right lower leg: Edema (trace) present.      Left lower leg: No edema.   Skin:     General: Skin is warm.      Comments: Induration and erythema much improved from previous exams. Minimal drainage on dressing.. Circular wound from I and D noted, clean.   Neurological:      Mental Status: He is alert.      Comments: Speech is clear, answering questions appropriately, moving all extremities    Psychiatric:         Mood and Affect: Mood normal.         Behavior: Behavior normal.         PROCEDURES:  I and D x2    CONSULTANTS:  General surgery     DISCHARGE MEDICATIONS:        Significant Medication Changes:   Start Metformin   Start Bactrim for 7 additional days (complete ~11 day course)  Oxycodone x2 days       Mason Garza, Mason Garza   Home Medication Instructions [id number]    Printed on:11/11/19 1545   Medication Information                      acetaminophen (TYLENOL) 500 MG tablet  Take 2 tablets by mouth 3 times daily             Alcohol Swabs (ALCOHOL PADS) 70 % PADS  1 Package by Does not apply route 3 times daily (before meals)             blood glucose monitor strips  Test 3 times a day & as needed for symptoms of irregular blood glucose.dispense as covered by insurance             glucose monitoring kit (FREESTYLE) monitoring kit  1 kit by Does not apply route daily Dispense as covered by insurance             Lancets MISC  Dispense lancet device DISPENSE AS COVERED BY INSURANCE             Lancets MISC  1 each by Does not apply route 3 times daily (before meals) DISPENSE AS COVERED BY INSURANCE  metFORMIN (GLUCOPHAGE) 500 MG tablet  Take 1 tablet by mouth 2 times daily (with meals)             methadone 10 MG/5ML solution  Take 90 mg by mouth daily.             nicotine (NICODERM CQ) 21 MG/24HR  Place 1 patch onto the skin daily             oxyCODONE (ROXICODONE) 5 MG immediate release tablet  Take 1 tablet by mouth every 4 hours as needed (severe pain) for up to 2  days.             sulfamethoxazole-trimethoprim (BACTRIM DS;SEPTRA DS) 800-160 MG per tablet  Take 2 tablets by mouth 2 times daily for 7 days                 DIET: regular    ACTIVITY: resume regular activity    SIGNIFICANT DIAGNOSTIC STUDIES:  RLE venous duplex 11/08/19  Conclusions   ??   1. There is no evidence of acute deep or superficial venous thrombosis   noted in the right lower extremity.   2. The left common femoral vein fully compresses and demonstrates   normal venous flow.     CT RLE 11/08/19     Impression:    ??   No visualized rim-enhancing fluid collection. There is subtly   decreased density within the medial and lateral gastrocnemius muscle   bellies, which can be seen with myositis. Additionally, there is soft   tissue thickening without definite fluid collection along the skin and   subcutaneous fat of the posterior proximal to mid leg. If there is   further concern, MRI is more sensitive for evaluation of small fluid   collections within the soft tissues.   ??   There is extensive subcutaneous edema and infiltration of the leg   extending into the ankle and foot, which is nonspecific but can be   seen with cellulitis.         COMPLEXITY OF FOLLOW UP:   []  Moderate Complexity: follow up within 7-14 calendar days (08657)   [x]  Severe Complexity: follow up within 7 calendar days (84696)    FOLLOW UP TESTING, PENDING RESULTS ORREFERRALS AT TRANSITIONAL CARE VISIT:    [x]   Yes  - ordered repeat BMP on 11/14/19   []   No    DISPOSITION:  Home    FACILITY/HOME CARE AGENCY NAME: n/a    Follow up with Omelia Blackwater, MD to be scheduled within 7 days    Notification (telephone encounter) to PCP initiated:   [x]   Yes    []   No    INSTRUCTIONS TO MA/SW: Please call patient on day after discharge (must document patient  contacted within 2 business days of discharge).    FOLLOW UP QUESTIONS FOR MA/SW:  1. Did you get medications filled and takingthem as instructed from discharge?  2. Are you following your discharge  instructions from your hospital stay?  3. Please confirm patient is scheduled for a follow up appointment within the above time frame.    DISCHARGE TIME: > 30 minutes    Electronically signed by Darnelle Catalan, DO on 11/11/19 at 8:54 AM EDT

## 2019-11-13 LAB — CULTURE, BLOOD 1
Blood Culture, Routine: NO GROWTH
Blood Culture, Routine: NO GROWTH

## 2019-11-14 NOTE — Telephone Encounter (Signed)
Attempted to contact pt, unable to  LM, VM not set up

## 2019-11-15 NOTE — Telephone Encounter (Signed)
Attempted to contact pt x 2 unable to leave a message
